# Patient Record
Sex: Female | Born: 1935 | Race: White | Hispanic: No | Smoking: Never smoker
Health system: Southern US, Community
[De-identification: ages and names within clinical notes are randomized; demographics above are authoritative.]

## PROBLEM LIST (undated history)

## (undated) ENCOUNTER — Emergency Department (HOSPITAL_COMMUNITY): Admission: EM | Discharge status: Against Medical Advice | Payer: Medicare Other

## (undated) DIAGNOSIS — J9611 Chronic respiratory failure with hypoxia: Secondary | ICD-10-CM

## (undated) DIAGNOSIS — J449 Chronic obstructive pulmonary disease, unspecified: Secondary | ICD-10-CM

## (undated) DIAGNOSIS — E079 Disorder of thyroid, unspecified: Secondary | ICD-10-CM

## (undated) DIAGNOSIS — I509 Heart failure, unspecified: Secondary | ICD-10-CM

## (undated) DIAGNOSIS — I272 Pulmonary hypertension, unspecified: Secondary | ICD-10-CM

## (undated) DIAGNOSIS — I10 Essential (primary) hypertension: Secondary | ICD-10-CM

## (undated) DIAGNOSIS — K219 Gastro-esophageal reflux disease without esophagitis: Secondary | ICD-10-CM

## (undated) DIAGNOSIS — B029 Zoster without complications: Secondary | ICD-10-CM

## (undated) DIAGNOSIS — N189 Chronic kidney disease, unspecified: Secondary | ICD-10-CM

## (undated) DIAGNOSIS — I499 Cardiac arrhythmia, unspecified: Secondary | ICD-10-CM

## (undated) DIAGNOSIS — H409 Unspecified glaucoma: Secondary | ICD-10-CM

## (undated) DIAGNOSIS — N179 Acute kidney failure, unspecified: Secondary | ICD-10-CM

## (undated) HISTORY — DX: Gastro-esophageal reflux disease without esophagitis: K21.9

## (undated) HISTORY — DX: Chronic kidney disease, unspecified: N18.9

## (undated) HISTORY — PX: KNEE ARTHROSCOPY: SUR90

## (undated) HISTORY — DX: Cardiac arrhythmia, unspecified: I49.9

## (undated) HISTORY — DX: Chronic kidney disease, unspecified: N17.9

## (undated) HISTORY — DX: Zoster without complications: B02.9

## (undated) HISTORY — PX: OTHER SURGICAL HISTORY: SHX169

## (undated) HISTORY — PX: TUBAL LIGATION: SHX77

---

## 1997-12-05 ENCOUNTER — Encounter: Admission: RE | Admit: 1997-12-05 | Discharge: 1997-12-05 | Payer: Self-pay | Admitting: Family Medicine

## 1997-12-27 ENCOUNTER — Encounter: Admission: RE | Admit: 1997-12-27 | Discharge: 1997-12-27 | Payer: Self-pay | Admitting: Family Medicine

## 1998-05-16 ENCOUNTER — Encounter: Admission: RE | Admit: 1998-05-16 | Discharge: 1998-05-16 | Payer: Self-pay | Admitting: Family Medicine

## 1998-06-01 ENCOUNTER — Encounter: Admission: RE | Admit: 1998-06-01 | Discharge: 1998-06-01 | Payer: Self-pay | Admitting: Family Medicine

## 1998-06-15 ENCOUNTER — Encounter: Admission: RE | Admit: 1998-06-15 | Discharge: 1998-09-13 | Payer: Self-pay | Admitting: *Deleted

## 1998-06-22 ENCOUNTER — Other Ambulatory Visit: Admission: RE | Admit: 1998-06-22 | Discharge: 1998-06-22 | Payer: Self-pay | Admitting: *Deleted

## 1998-06-22 ENCOUNTER — Encounter: Admission: RE | Admit: 1998-06-22 | Discharge: 1998-06-22 | Payer: Self-pay | Admitting: Family Medicine

## 1998-06-27 ENCOUNTER — Ambulatory Visit (HOSPITAL_COMMUNITY): Admission: RE | Admit: 1998-06-27 | Discharge: 1998-06-27 | Payer: Self-pay | Admitting: *Deleted

## 1998-07-07 ENCOUNTER — Encounter: Admission: RE | Admit: 1998-07-07 | Discharge: 1998-07-07 | Payer: Self-pay | Admitting: Family Medicine

## 1998-11-15 ENCOUNTER — Encounter: Admission: RE | Admit: 1998-11-15 | Discharge: 1998-11-15 | Payer: Self-pay | Admitting: Family Medicine

## 1998-11-20 ENCOUNTER — Encounter: Admission: RE | Admit: 1998-11-20 | Discharge: 1998-11-20 | Payer: Self-pay | Admitting: Family Medicine

## 1999-09-11 ENCOUNTER — Encounter: Admission: RE | Admit: 1999-09-11 | Discharge: 1999-09-11 | Payer: Self-pay | Admitting: Family Medicine

## 1999-11-16 ENCOUNTER — Encounter: Admission: RE | Admit: 1999-11-16 | Discharge: 1999-11-16 | Payer: Self-pay | Admitting: Family Medicine

## 2000-12-01 ENCOUNTER — Encounter (HOSPITAL_COMMUNITY): Admission: RE | Admit: 2000-12-01 | Discharge: 2000-12-31 | Payer: Self-pay | Admitting: Unknown Physician Specialty

## 2000-12-16 ENCOUNTER — Encounter (HOSPITAL_COMMUNITY): Admission: RE | Admit: 2000-12-16 | Discharge: 2001-01-15 | Payer: Self-pay | Admitting: Oncology

## 2000-12-16 ENCOUNTER — Encounter: Admission: RE | Admit: 2000-12-16 | Discharge: 2000-12-16 | Payer: Self-pay | Admitting: Oncology

## 2001-06-02 ENCOUNTER — Encounter (HOSPITAL_COMMUNITY): Admission: RE | Admit: 2001-06-02 | Discharge: 2001-07-02 | Payer: Self-pay | Admitting: Oncology

## 2001-06-02 ENCOUNTER — Encounter: Admission: RE | Admit: 2001-06-02 | Discharge: 2001-06-02 | Payer: Self-pay | Admitting: Oncology

## 2001-08-17 ENCOUNTER — Encounter (HOSPITAL_COMMUNITY): Admission: RE | Admit: 2001-08-17 | Discharge: 2001-09-16 | Payer: Self-pay | Admitting: Pulmonary Disease

## 2001-10-01 ENCOUNTER — Encounter (HOSPITAL_COMMUNITY): Admission: RE | Admit: 2001-10-01 | Discharge: 2001-10-31 | Payer: Self-pay | Admitting: Orthopaedic Surgery

## 2002-04-08 ENCOUNTER — Emergency Department (HOSPITAL_COMMUNITY): Admission: EM | Admit: 2002-04-08 | Discharge: 2002-04-08 | Payer: Self-pay | Admitting: Emergency Medicine

## 2002-04-08 ENCOUNTER — Encounter: Payer: Self-pay | Admitting: Emergency Medicine

## 2003-09-16 ENCOUNTER — Ambulatory Visit (HOSPITAL_COMMUNITY): Admission: RE | Admit: 2003-09-16 | Discharge: 2003-09-16 | Payer: Self-pay | Admitting: Internal Medicine

## 2004-03-20 ENCOUNTER — Ambulatory Visit (HOSPITAL_COMMUNITY): Admission: RE | Admit: 2004-03-20 | Discharge: 2004-03-20 | Payer: Self-pay | Admitting: Internal Medicine

## 2004-05-31 ENCOUNTER — Ambulatory Visit (HOSPITAL_COMMUNITY): Admission: RE | Admit: 2004-05-31 | Discharge: 2004-05-31 | Payer: Self-pay | Admitting: Internal Medicine

## 2004-09-02 ENCOUNTER — Emergency Department (HOSPITAL_COMMUNITY): Admission: EM | Admit: 2004-09-02 | Discharge: 2004-09-02 | Payer: Self-pay | Admitting: Emergency Medicine

## 2004-09-12 ENCOUNTER — Ambulatory Visit: Payer: Self-pay | Admitting: Internal Medicine

## 2004-09-24 ENCOUNTER — Ambulatory Visit (HOSPITAL_COMMUNITY): Admission: RE | Admit: 2004-09-24 | Discharge: 2004-09-24 | Payer: Self-pay | Admitting: Internal Medicine

## 2004-09-26 ENCOUNTER — Ambulatory Visit: Payer: Self-pay | Admitting: Internal Medicine

## 2004-09-26 ENCOUNTER — Ambulatory Visit (HOSPITAL_COMMUNITY): Admission: RE | Admit: 2004-09-26 | Discharge: 2004-09-26 | Payer: Self-pay | Admitting: Internal Medicine

## 2004-11-12 ENCOUNTER — Ambulatory Visit: Payer: Self-pay | Admitting: Orthopedic Surgery

## 2005-01-01 ENCOUNTER — Ambulatory Visit (HOSPITAL_COMMUNITY): Admission: RE | Admit: 2005-01-01 | Discharge: 2005-01-01 | Payer: Self-pay | Admitting: Orthopedic Surgery

## 2005-01-07 ENCOUNTER — Ambulatory Visit: Payer: Self-pay | Admitting: Orthopedic Surgery

## 2005-01-23 ENCOUNTER — Encounter: Admission: RE | Admit: 2005-01-23 | Discharge: 2005-01-23 | Payer: Self-pay | Admitting: Orthopedic Surgery

## 2005-02-06 ENCOUNTER — Encounter: Admission: RE | Admit: 2005-02-06 | Discharge: 2005-02-06 | Payer: Self-pay | Admitting: Orthopedic Surgery

## 2005-03-01 ENCOUNTER — Encounter: Admission: RE | Admit: 2005-03-01 | Discharge: 2005-03-01 | Payer: Self-pay | Admitting: Orthopedic Surgery

## 2005-03-05 ENCOUNTER — Ambulatory Visit: Payer: Self-pay | Admitting: Internal Medicine

## 2005-03-11 ENCOUNTER — Ambulatory Visit: Payer: Self-pay | Admitting: Orthopedic Surgery

## 2005-03-19 ENCOUNTER — Emergency Department (HOSPITAL_COMMUNITY): Admission: EM | Admit: 2005-03-19 | Discharge: 2005-03-19 | Payer: Self-pay | Admitting: Emergency Medicine

## 2005-04-23 ENCOUNTER — Ambulatory Visit (HOSPITAL_COMMUNITY): Admission: RE | Admit: 2005-04-23 | Discharge: 2005-04-23 | Payer: Self-pay | Admitting: Internal Medicine

## 2005-05-07 ENCOUNTER — Ambulatory Visit: Payer: Self-pay | Admitting: Internal Medicine

## 2005-05-08 ENCOUNTER — Ambulatory Visit (HOSPITAL_COMMUNITY): Admission: RE | Admit: 2005-05-08 | Discharge: 2005-05-08 | Payer: Self-pay | Admitting: Internal Medicine

## 2005-05-31 ENCOUNTER — Ambulatory Visit (HOSPITAL_COMMUNITY): Admission: RE | Admit: 2005-05-31 | Discharge: 2005-05-31 | Payer: Self-pay | Admitting: Neurosurgery

## 2005-06-02 ENCOUNTER — Ambulatory Visit: Admission: RE | Admit: 2005-06-02 | Discharge: 2005-06-02 | Payer: Self-pay | Admitting: Internal Medicine

## 2005-06-13 ENCOUNTER — Ambulatory Visit: Payer: Self-pay | Admitting: Pulmonary Disease

## 2005-06-24 ENCOUNTER — Encounter (HOSPITAL_COMMUNITY): Admission: RE | Admit: 2005-06-24 | Discharge: 2005-07-24 | Payer: Self-pay | Admitting: Neurosurgery

## 2005-07-02 ENCOUNTER — Ambulatory Visit: Payer: Self-pay | Admitting: Internal Medicine

## 2005-07-25 ENCOUNTER — Encounter (HOSPITAL_COMMUNITY): Admission: RE | Admit: 2005-07-25 | Discharge: 2005-08-15 | Payer: Self-pay | Admitting: Neurosurgery

## 2005-08-02 ENCOUNTER — Ambulatory Visit: Payer: Self-pay | Admitting: Internal Medicine

## 2005-09-25 ENCOUNTER — Ambulatory Visit: Payer: Self-pay | Admitting: Orthopedic Surgery

## 2005-10-07 ENCOUNTER — Ambulatory Visit: Payer: Self-pay | Admitting: Internal Medicine

## 2006-08-18 ENCOUNTER — Ambulatory Visit: Payer: Self-pay | Admitting: Internal Medicine

## 2006-08-18 ENCOUNTER — Inpatient Hospital Stay (HOSPITAL_COMMUNITY): Admission: EM | Admit: 2006-08-18 | Discharge: 2006-08-21 | Payer: Self-pay | Admitting: Emergency Medicine

## 2006-09-01 ENCOUNTER — Ambulatory Visit: Payer: Self-pay | Admitting: Internal Medicine

## 2006-09-15 HISTORY — PX: CARDIOVASCULAR STRESS TEST: SHX262

## 2006-10-08 ENCOUNTER — Ambulatory Visit (HOSPITAL_COMMUNITY): Admission: RE | Admit: 2006-10-08 | Discharge: 2006-10-08 | Payer: Self-pay | Admitting: Gastroenterology

## 2006-10-15 ENCOUNTER — Ambulatory Visit (HOSPITAL_COMMUNITY): Admission: RE | Admit: 2006-10-15 | Discharge: 2006-10-15 | Payer: Self-pay | Admitting: Internal Medicine

## 2006-10-20 ENCOUNTER — Ambulatory Visit (HOSPITAL_COMMUNITY): Admission: RE | Admit: 2006-10-20 | Discharge: 2006-10-20 | Payer: Self-pay | Admitting: Internal Medicine

## 2006-10-21 ENCOUNTER — Ambulatory Visit: Payer: Self-pay | Admitting: Gastroenterology

## 2006-11-06 ENCOUNTER — Ambulatory Visit: Payer: Self-pay | Admitting: Internal Medicine

## 2006-12-24 ENCOUNTER — Ambulatory Visit (HOSPITAL_COMMUNITY): Admission: RE | Admit: 2006-12-24 | Discharge: 2006-12-24 | Payer: Self-pay | Admitting: Neurosurgery

## 2007-05-06 ENCOUNTER — Ambulatory Visit: Payer: Self-pay | Admitting: Internal Medicine

## 2007-05-20 ENCOUNTER — Ambulatory Visit: Payer: Self-pay | Admitting: Internal Medicine

## 2007-05-20 ENCOUNTER — Ambulatory Visit (HOSPITAL_COMMUNITY): Admission: RE | Admit: 2007-05-20 | Discharge: 2007-05-20 | Payer: Self-pay | Admitting: Internal Medicine

## 2007-05-20 ENCOUNTER — Encounter: Payer: Self-pay | Admitting: Internal Medicine

## 2007-06-10 ENCOUNTER — Ambulatory Visit: Admission: RE | Admit: 2007-06-10 | Discharge: 2007-06-10 | Payer: Self-pay | Admitting: Internal Medicine

## 2008-05-07 ENCOUNTER — Emergency Department (HOSPITAL_COMMUNITY): Admission: EM | Admit: 2008-05-07 | Discharge: 2008-05-07 | Payer: Self-pay | Admitting: Emergency Medicine

## 2009-06-23 ENCOUNTER — Emergency Department (HOSPITAL_COMMUNITY): Admission: EM | Admit: 2009-06-23 | Discharge: 2009-06-23 | Payer: Self-pay | Admitting: Emergency Medicine

## 2010-01-18 ENCOUNTER — Emergency Department (HOSPITAL_COMMUNITY): Admission: EM | Admit: 2010-01-18 | Discharge: 2010-01-18 | Payer: Self-pay | Admitting: Emergency Medicine

## 2010-02-16 ENCOUNTER — Ambulatory Visit (HOSPITAL_COMMUNITY): Admission: RE | Admit: 2010-02-16 | Discharge: 2010-02-16 | Payer: Self-pay | Admitting: Cardiology

## 2010-02-21 ENCOUNTER — Ambulatory Visit (HOSPITAL_COMMUNITY): Admission: RE | Admit: 2010-02-21 | Discharge: 2010-02-21 | Payer: Self-pay | Admitting: Cardiology

## 2010-02-21 HISTORY — PX: CARDIAC CATHETERIZATION: SHX172

## 2010-03-04 ENCOUNTER — Emergency Department (HOSPITAL_COMMUNITY): Admission: EM | Admit: 2010-03-04 | Discharge: 2010-03-04 | Payer: Self-pay | Admitting: Emergency Medicine

## 2010-09-15 ENCOUNTER — Encounter: Payer: Self-pay | Admitting: Internal Medicine

## 2010-09-16 ENCOUNTER — Encounter: Payer: Self-pay | Admitting: Orthopedic Surgery

## 2010-09-16 ENCOUNTER — Encounter: Payer: Self-pay | Admitting: Internal Medicine

## 2010-09-16 ENCOUNTER — Encounter: Payer: Self-pay | Admitting: Cardiology

## 2010-09-17 ENCOUNTER — Encounter: Payer: Self-pay | Admitting: Internal Medicine

## 2010-11-12 LAB — POCT I-STAT 3, ART BLOOD GAS (G3+)
O2 Saturation: 89 %
pH, Arterial: 7.323 — ABNORMAL LOW (ref 7.350–7.400)
pO2, Arterial: 61 mmHg — ABNORMAL LOW (ref 80.0–100.0)

## 2010-11-12 LAB — GLUCOSE, CAPILLARY
Glucose-Capillary: 167 mg/dL — ABNORMAL HIGH (ref 70–99)
Glucose-Capillary: 202 mg/dL — ABNORMAL HIGH (ref 70–99)

## 2010-11-12 LAB — CBC
MCHC: 30.2 g/dL (ref 30.0–36.0)
MCV: 76.5 fL — ABNORMAL LOW (ref 78.0–100.0)
Platelets: 152 10*3/uL (ref 150–400)
RBC: 5.1 MIL/uL (ref 3.87–5.11)
RDW: 18.2 % — ABNORMAL HIGH (ref 11.5–15.5)
WBC: 6.9 10*3/uL (ref 4.0–10.5)

## 2010-11-12 LAB — DIFFERENTIAL
Basophils Relative: 0 % (ref 0–1)
Eosinophils Absolute: 0.1 10*3/uL (ref 0.0–0.7)
Lymphocytes Relative: 15 % (ref 12–46)
Lymphs Abs: 1 10*3/uL (ref 0.7–4.0)
Metamyelocytes Relative: 0 %
Monocytes Relative: 4 % (ref 3–12)
Myelocytes: 0 %
Neutro Abs: 5.5 10*3/uL (ref 1.7–7.7)
Neutrophils Relative %: 80 % — ABNORMAL HIGH (ref 43–77)

## 2010-11-12 LAB — POCT I-STAT 3, VENOUS BLOOD GAS (G3P V)
Acid-base deficit: 1 mmol/L (ref 0.0–2.0)
Bicarbonate: 25.7 mEq/L — ABNORMAL HIGH (ref 20.0–24.0)
O2 Saturation: 63 %
pCO2, Ven: 50.2 mmHg — ABNORMAL HIGH (ref 45.0–50.0)
pH, Ven: 7.316 — ABNORMAL HIGH (ref 7.250–7.300)
pO2, Ven: 36 mmHg (ref 30.0–45.0)

## 2010-11-12 LAB — BASIC METABOLIC PANEL
BUN: 11 mg/dL (ref 6–23)
CO2: 29 mEq/L (ref 19–32)
Calcium: 9.6 mg/dL (ref 8.4–10.5)
GFR calc Af Amer: >60 mL/min (ref >60)
GFR calc non Af Amer: >60 mL/min (ref >60)

## 2010-11-12 LAB — BRAIN NATRIURETIC PEPTIDE: Pro B Natriuretic peptide (BNP): 35.4 pg/mL (ref 0.0–100.0)

## 2010-11-12 LAB — POCT CARDIAC MARKERS

## 2010-11-29 LAB — DIFFERENTIAL
Basophils Relative: 0 % (ref 0–1)
Eosinophils Absolute: 0.1 10*3/uL (ref 0.0–0.7)
Eosinophils Relative: 1 % (ref 0–5)
Lymphocytes Relative: 12 % (ref 12–46)
Neutro Abs: 5.7 10*3/uL (ref 1.7–7.7)
Neutrophils Relative %: 77 % (ref 43–77)

## 2010-11-29 LAB — CBC
MCV: 72.9 fL — ABNORMAL LOW (ref 78.0–100.0)
Platelets: 157 10*3/uL (ref 150–400)
RBC: 4.81 MIL/uL (ref 3.87–5.11)

## 2010-11-29 LAB — COMPREHENSIVE METABOLIC PANEL
AST: 55 U/L — ABNORMAL HIGH (ref 0–37)
Alkaline Phosphatase: 96 U/L (ref 39–117)
BUN: 11 mg/dL (ref 6–23)
Creatinine, Ser: 0.81 mg/dL (ref 0.4–1.2)
GFR calc non Af Amer: >60 mL/min (ref >60)
Glucose, Bld: 230 mg/dL — ABNORMAL HIGH (ref 70–99)
Potassium: 3.9 mEq/L (ref 3.5–5.1)
Total Protein: 6.7 g/dL (ref 6.0–8.3)

## 2010-11-29 LAB — URINALYSIS, ROUTINE W REFLEX MICROSCOPIC
Glucose, UA: NEGATIVE mg/dL
Nitrite: NEGATIVE
pH: 6 (ref 5.0–8.0)

## 2010-11-29 LAB — URINE MICROSCOPIC-ADD ON

## 2011-01-08 NOTE — Op Note (Signed)
[name], [name]              ACCOUNT NO.:  [id number]   MEDICAL RECORD NO.:  [id number]          PATIENT TYPE:  AMB   LOCATION:  DAY                           FACILITY:  [facility]   PHYSICIAN:  R. Roetta Sessions, M.D. DATE OF BIRTH:  October 05, 1934   DATE OF PROCEDURE:  05/20/2007  DATE OF DISCHARGE:                               OPERATIVE REPORT   PROCEDURE PERFORMED:  Esophagogastroduodenoscopy with biopsy.   INDICATIONS FOR PROCEDURE:  76 year old lady with intermittent nausea,  vomiting, reflux symptoms, refractory to Zegerid, history of mass  cirrhosis.  EGD is now being done to further evaluate her symptoms.  This approach has been discussed with the patient at length.  The  potential risks, benefits and alternatives have been reviewed, questions  answered, she is agreeable, please see documentation in the medical  record.   PROCEDURE NOTE:  O2 saturation, blood pressure, pulse rate, and  respirations were monitored throughout the entire procedure.  Conscious  sedation with Versed 3 mg IV and Demerol 75 mg IV in divided doses.  Cetacaine spray for topical pharyngeal anesthesia.  Instrument Pentax  video chip system.   FINDINGS:  Examination of the tubular esophagus revealed entirely normal  esophageal mucosa.  There were no varices.  The EG junction was easily  traversed.  The gastric cavity was empty and insufflated well with air.  A thorough examination of the gastric mucosa including retroflexion of  the proximal stomach and esophagogastric junction demonstrated polypoid  appearing mucosa with overlying white plaques in a linear distribution  down across the pylorus just into the bulb.  Please see photos.  This  appeared to be a benign process.  However, the remainder of the gastric  appeared normal.  Pylorus patent, easily traversed.  Examination of the  bulb and second portion revealed no other mucosal abnormalities.   THERAPEUTIC/DIAGNOSTIC MANEUVERS:  These polypoid  areas of gastric  mucosa were biopsied for histologic study.  The patient tolerated the  procedure well and was reacted in endoscopy.   IMPRESSION:  Normal esophagus.  A linear array of polypoid mucosa with  overlying white exudate antrum extending just into the duodenal bulb,  likely a benign process, biopsied.  No evidence of portal gastropathy,  infiltrating process, or peptic ulcer disease. Patent pylorus, otherwise  normal D1 and D2.   RECOMMENDATIONS:  1. Stop Zegerid, begin AcipHex 20 mg orally daily for the time being.  2. Proceed with a solid phase gastric emptying study to evaluate      gastric emptying.  3. Further recommendations to follow.      [name], M.D.  Electronically Signed     RMR/MEDQ  D:  05/20/2007  T:  05/20/2007  Job:  [id number]   cc:   Kari D. Felecia Shelling, MD  Fax: [phone]

## 2011-01-08 NOTE — H&P (Signed)
NAMEADISSON, Lane              ACCOUNT NO.:  [id number]   MEDICAL RECORD NO.:  [id number]          PATIENT TYPE:  AMB   LOCATION:  DAY                           FACILITY:  [facility]   PHYSICIAN:  R. Roetta Sessions, M.D. DATE OF BIRTH:  1935/06/18   DATE OF ADMISSION:  DATE OF DISCHARGE:  LH                              HISTORY & PHYSICAL   CHIEF COMPLAINT:  Postprandial nausea and vomiting.  Reflux symptoms  refractory to Zegerid.   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  Zegerid is not helping them at this point in time.  She does not have any dysphagia.  She has been having intermittent  nausea and vomiting for several months, however.  This lady was recently  evaluated down at [facility] by Dr. Julieta Gutting.  We wanted to see if  we could get her in study protocols given her history of NASH cirrhosis.  She unfortunately was not a candidate for any specific trials.  She was  noted to be immune to hepatitis A and susceptible to hepatitis B and she  has had two or three of her immunization series for hepatitis B.  She  has not had any melena or rectal bleeding.   She has gained 6 pounds since she was last here.  Her other recent past  GI issues included episode of acute pancreatitis December 2007.  Gallbladder is in situ.  There is no evidence on ultrasound.  She had an  endoscopic ultrasound as well.  She had a diffusely hyperechoic  pancreatic parenchyma with fatty replacement, but there was no evidence  of stone disease.  It was notable pancreatis divisum could not be ruled  out.  Gallbladder appeared normal endosonographically.  She also notes  some progressive constipation.  She has not had any melena or rectal  bleeding.  She had a negative colonoscopy in 2005.  An EGD in 2005  demonstrated fundal gland polyps.  She also had a Given study which was  unremarkable (done for IDA) previously.  She is not taking anything for  her constipation symptoms at this time.  Prior CT demonstrated some  calcification on the mesenteric blood vessel takeoffs but no high-grade  stenosis.   PAST MEDICAL HISTORY:  1. Longstanding diabetes mellitus.  2. Hypertension.  3. Osteoarthritis.  4. Hypothyroidism.  5. Hyperlipidemia.  6. GERD.  7. History of pancreatitis.  8. Status post right oophorectomy.  9. Status post tubal ligation.   Celiac antibody panel previously negative.  Prior EGD in 2005 revealed  fundal gland polyps, biopsy proven.  She also had her esophagus dilated.  She was noted to have a subtle Schatzki's ring at that time.   CURRENT MEDICATIONS:  1. Synthroid daily dose unknown.  2. Verapamil 240 mg daily.  3. Xanax 0.5 mg b.i.d.  4. Tylenol p.r.n.  5. Hyzaar 100/25 daily.  6. Furosemide 20 mg daily.  7. Zetia 10 mg daily.  8. Hydrocodone/APAP 5/500 p.r.n.  9. Zegerid 40 mg daily.  10.Vitamin  B12 daily.  11.Actos 30 mg daily.  12.Allegra 180 mg daily.   ALLERGIES:  PENICILLIN.   FAMILY HISTORY:  Mother died of heart disease and diabetes age 34.  Father died with heart attack age 32.   SOCIAL HISTORY:  The patient is widowed.  She has five children.  She  lives at home.  No tobacco, no alcohol.   REVIEW OF SYSTEMS:  No recent chest pain, dyspnea on exertion.  She has  gained 6 pounds since she was here lastly.   EXAMINATION TODAY:  Reveals a morbidly-obese chronically-ill lady in no  acute distress.  Weight 290, height 5 feet 2 inches, temperature 97.6,  BP 118/70, pulse 76.  SKIN:  Warm and dry.  There is no jaundice.  HEENT:  No scleral icterus, conjunctivae are pink.  Oral cavity - no  lesions.  CHEST:  Lungs are clear to auscultation.  CARDIOVASCULAR:  Regular rate and rhythm without murmur, gallop, rub.  ABDOMEN:  Massively obese, positive bowel sounds, no obvious mass or  organomegaly.  No obvious shifting  dullness or fluid wave.  She does  have some epigastric tenderness to palpation.  EXTREMITY EXAM:  Trace lower extremity edema.   IMPRESSION:  [name] is a pleasant 76 year old lady with  recent worsening of abdominal pain and nausea/vomiting and reflux  symptoms refractory to Zegerid.  She is massively obese.  She has  history of NASH cirrhosis and a recent bout of pancreatitis, etiology  never well defined.  The findings of imaging are reassuring at least for  no evidence of tumor or occult gallstone disease.  I am glad to see that  she was finally evaluated down at [facility].  She is about to finish up her  hepatitis B vaccine series.   RECOMMENDATIONS:  EGD is warranted now to further evaluate her abdominal  pain, nausea, vomiting, refractory GERD symptoms, and to screen her for  esophageal varices.  This approach has been discussed with the  patient at length.  Potential risks, benefits and alternatives have been  reviewed.  Will go ahead and check her amylase, lipase, CBC, and LFTs  today.  Will go ahead and start her on some low-dose lactulose for  constipation, 15 mL at bedtime, prescription given.  Will make further  recommendations in the very near future.      Jonathon Bellows, M.D.  Electronically Signed     RMR/MEDQ  D:  05/06/2007  T:  05/06/2007  Job:  [id number]   cc:   Tesfaye D. Felecia Shelling, MD  Fax: [phone]

## 2011-01-11 NOTE — H&P (Signed)
NAMEJAYNIE, [name]                          ACCOUNT NO.:  [id number]   MEDICAL RECORD NO.:  [id number]                    PATIENT TYPE:   LOCATION:                                       FACILITY:   PHYSICIAN:  R. Roetta Sessions, M.D.              DATE OF BIRTH:  Aug 14, 1935   DATE OF ADMISSION:  DATE OF DISCHARGE:                                HISTORY & PHYSICAL   CHIEF COMPLAINT:  Longstanding gastroesophageal reflux disease, intermittent  esophageal dysphagia.  No prior colonoscopy.   HISTORY OF PRESENT ILLNESS:  [name] is a 76 year old morbidly-obese lady  who presented for further evaluation of gastroesophageal reflux disease back  in July.  We saw Kari in the office. We started Kari on some Nexium which is  very successful in controlling Kari symptoms.  She does have intermittent  esophageal dysphagia to solids and pills.  She had never had a colonoscopy.  She was felt to have an enlarged liver when she was seen previously.  Ultrasound did reveal fatty infiltration of the liver, but diminished  sensitivity of the ultrasound exam due to Kari large body habitus. Spleen was  slightly enlarged.  LFT's were normal except for a slightly-elevated  alkaline phosphatase at 123.   PAST MEDICAL HISTORY:  Type 2 diabetes mellitus within osteoarthritis,  hyperthyroidism, hypertension, hypercholesterolemia, gastroesophageal reflux  disease times years, seasonal allergies.   CURRENT MEDICATIONS:  1.  Nexium 40 mg daily.  2.  Tricor.  3.  Ultracet.  4.  Tylenol.  5.  Xanax.  6.  Verapamil.  7.  Travatan eye drops.  8.  Cozaar.  9.  Amaryl.  10. Avandia.  11. Zyrtec.   ALLERGIES:  PENICILLIN.   PAST SURGICAL HISTORY:  1.  Tubal ligation.  2.  Left-knee arthroscopy.  3.  Right oophorectomy.   FAMILY HISTORY:  Mother died of heart disease and diabetes at age 61.  Father died of heart disease at age 3.  No history of colorectal cancer or  other chronic gastrointestinal  illness.   SOCIAL HISTORY:  The patient is widowed in June of this year.  She has five  children.  She is a Fut[employer] trader.  No tobacco, no alcohol.   REVIEW OF SYSTEMS:  As in history of present illness.   PHYSICAL EXAMINATION:  GENERAL:  A pleasant, 76 year old lady.  VITAL SIGNS:  Weight 280.  She is up 10 pounds from when she was originally  seen on March 16, 2004.  Height 5 feet, 3 inches.  Blood pressure 130/70.  Pulse 88.  SKIN: Skin warm and dry.  No jaundice or stigmata of chronic liver disease.  HEENT:  No scleral icterus.  Oral cavity, no lesions.  CHEST:  Lungs are clear to auscultation.  CARDIAC:  Regular rate and rhythm without murmur, gallop or rub.  BREAST:  Deferred.  ABDOMEN:  Massively obese.  She was unable to get  up on the examining table.  Therefore, she was examined in the chair which was suboptimal.  The abdomen  is soft and nontender.  Liver edge is palpable below the right costal  margin.  Spleen is not appreciated.   IMPRESSION:  A 76 year old lady with a longstanding history of  gastroesophageal reflux disease symptoms.  The symptoms are well controlled  on Nexium.  She does describe to me intermittent esophageal dysphagia.  She  has a fatty liver in a setting of morbid obesity, and type 2 diabetes  mellitus.  She has never had a colonoscopy.   I have recommended both an esophagogastroduodenoscopy (EGD) and colonoscopy  in the very near future.  I have discussed this approach with [name] at  length, the potential risks, benefits, alternatives have been reviewed.  Kari  Lane was also present for the discussion.  All questions were answered.  She is agreeable.  Plan is set up for EGD and colonoscopy in the near  future.   I have advised [name] to please try to loose some weight.  This would be  of overall great benefit to Kari health.  Further recommendations to follow  pending results of EGD and colonoscopy.                                                 [name], M.D.    RMR/MEDQ  D:  05/14/2004  T:  05/14/2004  Job:  [id number]   cc:   Tesfaye D. Felecia Shelling, M.D.  [address]  [place]  [place] [address]  Fax: [phone]   R. Roetta Sessions, M.D.  [address]  [place]  [place] [address]  Fax: [phone]

## 2011-01-11 NOTE — Consult Note (Signed)
NAMEPIETRINA, Lane              ACCOUNT NO.:  [id number]   MEDICAL RECORD NO.:  [id number]          PATIENT TYPE:  INP   LOCATION:  A217                          FACILITY:  [facility]   PHYSICIAN:  Lionel December, M.D.    DATE OF BIRTH:  02-01-1935   DATE OF CONSULTATION:  08/18/2006  DATE OF DISCHARGE:                                 CONSULTATION   REASON FOR CONSULTATION:  Acute pancreatitis.   HISTORY OF PRESENT ILLNESS:  [name] is a 76 year old Caucasian female  who was in her usual state of health until last night.  She went to bed  fine, but she woke up about an hour later with severe pain across her  upper abdomen.  This pain was not associated with nausea, vomiting,  fever or chills.  It was unrelenting pain.  She, therefore, asked her  family members to bring her to the emergency room.  She was evaluated in  the emergency room and noted to have elevated amylase and lipase.  She  was hospitalized and begun on therapy.  She had ultrasound this morning  which shows fatty liver, splenomegaly, and fatty pancreas, no evidence  of dilated biliary system.  This was not well seen, though.  This study  did not show any changes of pancreatitis.   This morning she feels better. Now she has soreness mainly in her  epigastric area.  She recalls she may have had an episode 6 years or  longer ago.  She states she had a cardiac catheterization which was  normal.  She has never been told that she had pancreatitis.  She has  seen Dr. Jena Gauss in the past for chronic GERD and dysphagia and had EGD in  October 2005 and had her esophagus dilated.  She also had benign antral  polyps.   REVIEW OF THE SYSTEMS:  Negative for melena or rectal bleeding, diarrhea  or constipation.  She has had a good appetite, but she is trying hard to  lose weight.  She states she has lost about 10-12 pounds this year.  She  has generalized joint pains, worse in her hip joints.   CURRENT MEDICATIONS:  1.  Hydrochlorothiazide 25 mg p.o. daily.  2. Synthroid 150 mcg daily.  3. Cozaar 100 mg daily.  4. Protonix 40 mg IV q.24 h.  5. Travatan eye drops to left eye at bedtime.  6. Verapamil 240 mg daily.  7. Xanax 0.25 mg b.i.d. p.r.n.  8. Hydrocodone/APAP 5/500 one q. 6 h, p.r.n.  9. Morphine sulfate 1-2 mg IV q. 4 h. p.r.n.  10.Zofran 4 mg IV q. 6 h. p.r.n.  11.GI cocktail   At home, she also has been on:  1. Amaryl 4 mg daily.  2. Actos 30 mg daily.  3. Zegerid 40 mg q.a.m.  4. Zertac10 mg daily.  5. Doxycycline 100 mg b.i.d..   PAST MEDICAL HISTORY:  She has been diabetic and hypertensive for about  15 years.  She has generalized osteoarthrosis, worse in the hip joints.  She has hypothyroidism, hyperlipidemia, chronic GERD.  She had her  esophagus dilated in  October 2005 at which time she also had a screening  colonoscopy which was normal other than internal hemorrhoids.  She had  the right oophorectomy in 1980, and prior that, she had tubal ligation  in 1968.   History of splenomegaly based on ultrasonography of July 2005 and  September 2006 and seen again on the current study.  The patient is not  aware of this diagnosis.   Obesity.   Glaucoma, left eye.   She is receiving doxycycline for skin infection to her abdominal wall.   ALLERGIES:  To PENICILLIN. She developed skin rash, dizziness, and  syncope.  She did not indicate that she is allergic to DARVOCET-N 100,  but chart does reflect that.   FAMILY HISTORY:  Both parents are deceased.  Mother had CAD and died of  MI at age 52.  Father had CAD but died of bone cancer at age 41.  She  has two brothers and four sister sisters, all of whom are diabetic.  One  brother and one sister are on insulin.   SOCIAL HISTORY:  She is widowed.  She has five children in good health.  She helped with farming and also worked at [employer], but she is now  retired.  She has never smoked cigarettes but does dip snuff which she  states she  started at age 78. She does not drink alcohol.   PHYSICAL EXAMINATION:  GENERAL:  Pleasant, moderately obese Caucasian  female who is in no acute distress.  VITAL SIGNS: Her weight is not available. Pulse 90 per minute, blood  pressure 141/72, temperature 98.3, respirations 18.  HEENT: Conjunctivae is pink.  Sclerae is nonicteric.  Oral pharyngeal  mucosa is normal.  A few teeth are missing, the rest in fair to  satisfactory condition.  NECK:  No neck masses or thyromegaly noted.  CARDIAC EXAM: with regular rhythm.  Normal S1-S2.  No murmur or gallop  noted.  LUNGS:  Clear to auscultation.  ABDOMEN: Protuberant.  Bowel sounds are normal.  She has mild to  moderate tenderness at mid epigastrium and mild tenderness at left upper  quadrant.  Spleen is not palpable.  Liver edge is 6 cm below right  costal margin.  It Is soft and minimally tender, span 15-16 cm.  EXTREMITIES:  She does not have peripheral edema or clubbing.   Labs from admission which was last night, WBC 4.4, hemoglobin 10.8,  hematocrit 33.3, MCV  75.6, platelet count 163,000. Serum sodium 139,  potassium 3.7, chloride 108, CO2 23, glucose 146, BUN 17, creatinine  0.7, bilirubin 0.8, alkaline phosphatase 106, AST 32, ALT 17, total  protein 6.2 with albumin of 3.6 and calcium of 9.8.  Her amylase was  174, lipase 347. Troponin was less than 0.05.   Labs from this morning: Bilirubin is 0.7, alkaline phosphatase 111, AST  59, ALT 28, albumin 3.2.  Amylase is 177, and lipase is 253.   Ultrasound reviewed with Dr. Carolann Littler. She has fatty liver, splenomegaly,  fatty pancreas, CBD, and intrahepatic biliary radicals do not appear to  be dilated.   ASSESSMENT:  [name] is a 76 year old Caucasian female who presents with  acute onset of upper abdominal pain associated with elevated amylase and  lipase of 174 and 347, respectively.  She had normal transaminase on admission, but her AST is now mildly elevated at 59, but ALT is  normal.  Ultrasound is negative for cholelithiasis, dilated common bile duct, or  changes of pancreatitis, but she  does have splenomegaly.  Apparently she  has had enlarged spleen since July 2005.   I suspect that she has biliary pancreatitis.  However, would need more  data to support this diagnosis before ERCP is considered.  If there is  significant bump in her transaminases, would consider ERCP with  sphincterotomy.  If this is not the case and CT which will be ordered is  negative, she will need endoscopic ultrasound.   Anemia.  MCV is low.  No history of GI bleed. Last EGD and colonoscopy  were in October 2005.   Splenomegaly of at least 2-1/2 years duration.  This may be nonspecific  or indicative of underlying occult liver disease; i.e., nonalcoholic  steatohepatitis.   RECOMMENDATIONS:  1. Abdominal CT today with pancreas protocol.  2. Check her PT/PTT and repeat LFTs in a.m..  Will also request iron      studies, B12 and folate.   We would like to thank Dr. Felecia Shelling for the opportunity to participate in  the care of this nice lady.      Lionel December, M.D.  Electronically Signed     NR/MEDQ  D:  08/18/2006  T:  08/18/2006  Job:  [id number]

## 2011-01-11 NOTE — H&P (Signed)
[name], [name]              ACCOUNT NO.:  [id number]   MEDICAL RECORD NO.:  [id number]          PATIENT TYPE:  INP   LOCATION:  A217                          FACILITY:  [facility]   PHYSICIAN:  Tesfaye D. Felecia Shelling, MD   DATE OF BIRTH:  18-Jan-1935   DATE OF ADMISSION:  08/17/2006  DATE OF DISCHARGE:  LH                              HISTORY & PHYSICAL   CHIEF COMPLAINT:  Abdominal pain.   HISTORY OF PRESENT ILLNESS:  This is a 76 year old female with a history  of multiple medical illnesses who was brought to the emergency room due  to abdominal pain of two days' duration.  The pain is mainly in the  epigastric and upper quadrant area.  The patient notes that the pain is  persistent.  No nausea or vomiting.  She was evaluated in the emergency  room and was admitted.  The patient feels slightly better now.   REVIEW OF SYSTEMS:  No fever, chills, cough, chest pain, shortness of  breath, palpitations, nausea, vomiting, dysuria, urgency, or frequency  of urination.   PAST MEDICAL HISTORY:  1. Diabetes mellitus.  2. Hypothyroidism.  3. Degenerative joint disease.  4. Hyperlipidemia.  5. Gastroesophageal reflux disease.  6. History of allergic rhinitis.   CURRENT MEDICATIONS:  1. Amaryl 4 mg daily.  2. Hydrochlorothiazide 25 mg p.o. daily.  3. Synthroid 125 mcg p.o. daily.  4. Lortab 5/500 p.o. q.6 h. p.r.n.   The patient has some more medications.  However, she does not know all  the list.   SOCIAL HISTORY:  The patient lives with her family.  She has no history  of alcohol, tobacco, or substance abuse.   PHYSICAL EXAMINATION:  GENERAL:  The patient is alert, awake, and  chronically sick looking.  VITAL SIGNS:  Blood pressure 141/72, pulse 90, respiratory rate 18,  temperature 98.3 degrees Fahrenheit.  HEENT:  Pupils are equal and reactive.  NECK:  Supple.  CHEST:  Clear lung fields.  Good air entry.  CARDIOVASCULAR:  First and second heart sounds heard.  No murmur, no  gallop.  ABDOMEN:  Obese.  There is mild tenderness around the epigastric and  right upper quadrant area.  Bowel sounds are positive.  No masses, no  organomegaly.  EXTREMITIES:  No leg edema.   LABORATORIES ON ADMISSION:  WBC 4.4, hemoglobin 10.8, hematocrit 33.3,  platelets 163.  Sodium 138, potassium 3.7, chloride 108, carbon dioxide  23, glucose 146, BUN 17, creatinine 0.7, bilirubin 0.8, alkaline  phosphatase 106, AST 33, ALT 17, total protein 6.2, albumin 3.6, calcium  9.8, amylase 174, lipase 347.  Cardiac enzymes are negative.   ASSESSMENT:  This is a 76 year old female patient who presented with  upper quadrant and epigastric area pain.  The etiology of her pain is  not clear at this time.  The possibility of acute cholecystitis has to  be considered.   PLAN:  Will do abdominal sonogram.  Will start the patient on a clear  liquid diet.  Will continue a proton pump inhibitor, and will do a GI  consult.  Will start the  patient on her regular medications.      Tesfaye D. Felecia Shelling, MD  Electronically Signed     TDF/MEDQ  D:  08/18/2006  T:  08/18/2006  Job:  [id number]

## 2011-01-11 NOTE — Op Note (Signed)
NAMEJUANICE, [name]              ACCOUNT NO.:  [id number]   MEDICAL RECORD NO.:  [id number]            PATIENT TYPE:   LOCATION:                                 FACILITY:   PHYSICIAN:  R. Roetta Sessions, M.D.      DATE OF BIRTH:   DATE OF PROCEDURE:  05/31/2004  DATE OF DISCHARGE:                                 OPERATIVE REPORT   PROCEDURE:  Esophagogastroduodenoscopy with Elease Hashimoto dilation followed by  snare polypectomy, followed by screening colonoscopy.   ENDOSCOPIST:  Gerrit Friends. Rourk, M.D.   INDICATIONS FOR PROCEDURE:  The patient is a 76 year old lady with  intermittent esophageal dysphagia, reflux symptoms, fairly well controlled  on 40 mg orally daily of Nexium.  She is here for screening colonoscopy and  further evaluation of dysphagia.  This approach has been discussed with the  patient at length.  The potential risks, benefits, and alternatives have  been reviewed; questions answered.   PROCEDURE NOTE:  O2 saturation, blood pressure, pulse and respirations were  monitored throughout the entire procedure.  Conscious sedation: Versed 100  mg IV, Demerol 7 mg IV in divided doses.   INSTRUMENT:  Olympus video chip system.   FINDINGS:  Examination of the tubular esophagus revealed subtle Schatzki  ring, otherwise esophageal mucosa appeared normal.  The EG junction was  easily traversed.   STOMACH:  The gastric cavity was empty.  It insufflated well with air.  A  thorough examination of the gastric mucosa including a retroflex view of the  proximal stomach and esophagogastric junction demonstrated only a small  hiatal hernia and multiple gastric antral polyps, the largest approximately  8 mm.  It was pedunculated.  Please see photos.  The pylorus was patent and  easily traversed.  Examination of the bulb and second portion revealed no  abnormalities.   THERAPEUTIC/DIAGNOSTIC MANEUVERS:  1.  A 56 French Maloney dilator was passed to full insertion with a look      back  revealing no apparent complication related to passage of the      dilator.  2.  The largest of the antral polyps was removed with snare polypectomy and      recovery with the Lucina Mellow net without apparent complication.   The patient tolerated the procedure well and was prepared for colonoscopy.  A digital rectal exam revealed no abnormalities.   ENDOSCOPIC FINDINGS:  The prep was adequate.   RECTUM:  Examination of the rectal mucosa including the retroflex view of  the anal verge revealed only internal hemorrhoids.   COLON:  The colonic mucosa was surveyed from the rectosigmoid junction  through the left transverse and right colon to the area of the appendiceal  orifice, ileocecal valve, and cecum.  These structures were well seen and  photographed for the record.   From this level the scope was slowly withdrawn.  All previously mentioned  mucosal surfaces were again seen.  The colonic mucosa appeared normal.  The  patient tolerated both procedures well and was reacted in endoscopy.   EGD IMPRESSION:  1.  Subtle Schatzki ring, otherwise  normal esophagus, status post dilation      as described above.  2.  Stomach multiple antral polyps and small hiatal hernia, status post      polypectomy.  3.  The remainder of the gastric mucosa appeared normal.  4.  Normal D1 and D2.   COLONOSCOPY FINDINGS:  1.  Internal hemorrhoids; otherwise normal rectum.  2.  Normal colon.   RECOMMENDATIONS:  1.  No aspirin or arthritis medications for the next 10 days.  2.  Follow up on path.  3.  Continue Nexium 40 mg orally daily.  4.  Further recommendations to follow.     Otelia Sergeant   RMR/MEDQ  D:  05/31/2004  T:  05/31/2004  Job:  [id number]   cc:   Tesfaye D. Felecia Shelling, MD  [address]  [place]  [place] [address]  Fax: [phone]   R. Roetta Sessions, M.D.  [address]  [place]  [place] [address]  Fax: [phone]

## 2011-01-11 NOTE — Discharge Summary (Signed)
[name], [name]              ACCOUNT NO.:  [id number]   MEDICAL RECORD NO.:  [id number]          PATIENT TYPE:  INP   LOCATION:  A217                          FACILITY:  [facility]   PHYSICIAN:  Tesfaye D. Felecia Shelling, MD   DATE OF BIRTH:  [birth year]   DATE OF ADMISSION:  08/18/2006  DATE OF DISCHARGE:  12/27/2007LH                               DISCHARGE SUMMARY   DISCHARGE DIAGNOSES:  1. Acute pancreatitis.  2. Diabetes mellitus.  3. Morbid obesity.  4. Degenerative joint disease.  5. Anemia.  6. History of gastroesophageal reflux disease.  7. Fever of unknown origin.  8. Chronic splenomegaly.  9. Hypertension.   DISCHARGE MEDICATIONS:  1. Ferrous sulfate 325 mg p.o. b.i.d.  2. Levothyroxine 150 mg p.o. daily.  3. Amaryl 4 mg daily.  4. Actos 30 mg p.o. daily.  5. Xanax 0.5 mg b.i.d.  6. Verapamil ER 240 mg daily.  7. Zetia 30 mg daily.  8. Hyzaar 10/25 one tablet p.o. daily.  9. Lortab 5/500 one tablet q.6 h. p.r.n. for pain.   DISPOSITION:  Patient was discharged to home in stable condition.   HOSPITAL COURSE:  This is a 76 year old female patient with history of  multiple medical illnesses was admitted due to abdominal pain of two  days duration. Her amylase and lipase were elevated. She was seen by GI.  The patient was treated medically. Over her hospital stay,  her symptoms  improved. She was discharged back home to continue her __________.      Tesfaye D. Felecia Shelling, MD  Electronically Signed    TDF/MEDQ  D:  09/18/2006  T:  09/18/2006  Job:  [id number]

## 2011-01-11 NOTE — Procedures (Signed)
[name], [name]              ACCOUNT NO.:  [id number]   MEDICAL RECORD NO.:  [id number]          PATIENT TYPE:  OUT   LOCATION:  SLEEP LAB                     FACILITY:  [facility]   PHYSICIAN:  Marcelyn Bruins, M.D. [facility] DATE OF BIRTH:  [birth year]   DATE OF STUDY:  06/02/2005                              NOCTURNAL POLYSOMNOGRAM   REFERRING PHYSICIAN:  Dr. Mayo Ao.   INDICATIONS FOR STUDY:  Hypersomnia with sleep apnea. Epworth score: 11.   SLEEP ARCHITECTURE:  The patient has total sleep time of 179 minutes with  very decreased slow wave sleep and REM sleep onset latency was very  prolonged at 258 minutes and REM onset was at the upper limits of normal.  Sleep efficiency was very poor at 41%.   RESPIRATORY DATA:  The patient was found to have 55 obstructive apneas and  24 obstructive hypopneas for a respiratory disturbance index of 27 events  per hour. It is very interesting that all of her events were REM related,  and occurred at the very end of the study. Therefore, the patient did not  meet split night criteria. There was moderate to loud snoring.   OXYGEN DATA:  The patient had O2 desaturation as low as 70% during her REM  related events.   CARDIAC DATA:  The patient had rare PAC and PVC with no clinically  significant cardiac arrhythmias.   MOVEMENT/PARASOMNIAS:  The patient was found to have 22 leg jerks with only  one per hour resulting in arousal or awakening. There was no abnormal  behavior behaviors noted.   IMPRESSION/RECOMMENDATIONS:  1.  Moderate obstructive sleep apnea that is primarily REM related.      Respiratory disturbance index was 27 events per hour with O2      desaturation as low as 70%. Treatment for this degree of sleep apnea may      include weight loss if appropriate, upper airway surgery, oral      appliance, or C-PAP. Consideration could also be given to REM      suppressing drugs, however I would consider this as second-line therapy.  2.   Prolonged sleep onset latency. It is unclear whether this is due to an      element of insomnia or whether it was a first night effect associated      with the sleep lab. Clinical correlation is suggested.                                           ______________________________  Marcelyn Bruins, M.D. [facility]  Diplomate, American Board of Sleep  Medicine    KC/MEDQ  D:  06/13/2005 14:21:26  T:  06/13/2005 15:52:23  Job:  [id number]

## 2011-02-06 ENCOUNTER — Other Ambulatory Visit (HOSPITAL_COMMUNITY)
Admission: RE | Admit: 2011-02-06 | Discharge: 2011-02-06 | Disposition: A | Discharge status: Home / Self Care | Payer: Medicare Other | Source: Ambulatory Visit | Attending: General Surgery | Admitting: General Surgery

## 2011-02-06 ENCOUNTER — Other Ambulatory Visit: Payer: Self-pay | Admitting: General Surgery

## 2011-02-06 DIAGNOSIS — C44319 Basal cell carcinoma of skin of other parts of face: Secondary | ICD-10-CM | POA: Insufficient documentation

## 2011-03-05 ENCOUNTER — Other Ambulatory Visit (HOSPITAL_COMMUNITY): Payer: Self-pay | Admitting: Internal Medicine

## 2011-03-05 DIAGNOSIS — Z139 Encounter for screening, unspecified: Secondary | ICD-10-CM

## 2011-03-08 ENCOUNTER — Ambulatory Visit (HOSPITAL_COMMUNITY)
Admission: RE | Admit: 2011-03-08 | Discharge: 2011-03-08 | Disposition: A | Discharge status: Home / Self Care | Payer: PRIVATE HEALTH INSURANCE | Source: Ambulatory Visit | Attending: Internal Medicine | Admitting: Internal Medicine

## 2011-03-08 DIAGNOSIS — Z139 Encounter for screening, unspecified: Secondary | ICD-10-CM

## 2011-03-08 DIAGNOSIS — M818 Other osteoporosis without current pathological fracture: Secondary | ICD-10-CM | POA: Insufficient documentation

## 2011-03-08 DIAGNOSIS — Z1231 Encounter for screening mammogram for malignant neoplasm of breast: Secondary | ICD-10-CM | POA: Insufficient documentation

## 2011-03-08 DIAGNOSIS — Z780 Asymptomatic menopausal state: Secondary | ICD-10-CM | POA: Insufficient documentation

## 2011-03-29 ENCOUNTER — Encounter: Payer: Self-pay | Admitting: Emergency Medicine

## 2011-03-29 ENCOUNTER — Emergency Department (HOSPITAL_COMMUNITY): Payer: PRIVATE HEALTH INSURANCE

## 2011-03-29 ENCOUNTER — Emergency Department (HOSPITAL_COMMUNITY)
Admission: EM | Admit: 2011-03-29 | Discharge: 2011-03-29 | Disposition: A | Discharge status: Home / Self Care | Payer: PRIVATE HEALTH INSURANCE | Attending: Emergency Medicine | Admitting: Emergency Medicine

## 2011-03-29 DIAGNOSIS — IMO0002 Reserved for concepts with insufficient information to code with codable children: Secondary | ICD-10-CM | POA: Insufficient documentation

## 2011-03-29 DIAGNOSIS — S82899A Other fracture of unspecified lower leg, initial encounter for closed fracture: Secondary | ICD-10-CM

## 2011-03-29 DIAGNOSIS — S8000XA Contusion of unspecified knee, initial encounter: Secondary | ICD-10-CM

## 2011-03-29 DIAGNOSIS — W010XXA Fall on same level from slipping, tripping and stumbling without subsequent striking against object, initial encounter: Secondary | ICD-10-CM

## 2011-03-29 DIAGNOSIS — S8253XA Displaced fracture of medial malleolus of unspecified tibia, initial encounter for closed fracture: Secondary | ICD-10-CM | POA: Insufficient documentation

## 2011-03-29 DIAGNOSIS — Y92009 Unspecified place in unspecified non-institutional (private) residence as the place of occurrence of the external cause: Secondary | ICD-10-CM | POA: Insufficient documentation

## 2011-03-29 DIAGNOSIS — S52539A Colles' fracture of unspecified radius, initial encounter for closed fracture: Secondary | ICD-10-CM | POA: Insufficient documentation

## 2011-03-29 DIAGNOSIS — S80219A Abrasion, unspecified knee, initial encounter: Secondary | ICD-10-CM

## 2011-03-29 DIAGNOSIS — S52599A Other fractures of lower end of unspecified radius, initial encounter for closed fracture: Secondary | ICD-10-CM

## 2011-03-29 DIAGNOSIS — E079 Disorder of thyroid, unspecified: Secondary | ICD-10-CM | POA: Insufficient documentation

## 2011-03-29 DIAGNOSIS — E119 Type 2 diabetes mellitus without complications: Secondary | ICD-10-CM | POA: Insufficient documentation

## 2011-03-29 HISTORY — DX: Disorder of thyroid, unspecified: E07.9

## 2011-03-29 MED ORDER — OXYCODONE-ACETAMINOPHEN 5-325 MG PO TABS: 6.0000 | ORAL_TABLET | ORAL | Status: DC | PRN

## 2011-03-29 MED ORDER — OXYCODONE-ACETAMINOPHEN 5-325 MG PO TABS: 2.0000 | ORAL_TABLET | ORAL | Status: AC | PRN

## 2011-03-29 MED ORDER — OXYCODONE-ACETAMINOPHEN 5-325 MG PO TABS
1.0000 | ORAL_TABLET | Freq: Once | ORAL | Status: AC
  Administered 2011-03-29: 1 via ORAL

## 2011-03-29 NOTE — ED Provider Notes (Signed)
History     CSN: [id number] Arrival date & time: 03/29/2011  6:40 PM  Chief Complaint  Patient presents with  . Fall  . Leg Pain  . Knee Pain  . Arm Injury   Patient is a 76 y.o. female presenting with fall, leg pain, knee pain, and arm injury. The history is provided by the patient.  Fall The accident occurred less than 1 hour ago. Distance fallen: She tripped going up a ramp. She has very poor balance. The pain is present in the left wrist and left knee (She is also complaining of pain in her left ankle and left first toe.). The pain is at a severity of 9/10. The pain is severe. She was not ambulatory at the scene. There was no entrapment after the fall. There was no drug use involved in the accident. There was no alcohol use involved in the accident. The symptoms are aggravated by pressure on the injury and use of the injured limb.  Leg Pain   Knee Pain  Arm Injury     Past Medical History  Diagnosis Date  . Diabetes mellitus   . Thyroid disease     No past surgical history on file.  No family history on file.  History  Substance Use Topics  . Smoking status: Never Smoker   . Smokeless tobacco: Not on file  . Alcohol Use: No    OB History    Grav Para Term Preterm Abortions TAB SAB Ect Mult Living                  Review of Systems  All other systems reviewed and are negative.    Physical Exam  BP 117/58  Pulse 94  Temp(Src) 98.2 F (36.8 C) (Oral)  Resp 18  SpO2 96%  Physical Exam  Nursing note and vitals reviewed. Constitutional: She is oriented to person, place, and time. She appears well-developed and well-nourished.       Obese.  HENT:  Head: Normocephalic and atraumatic.  Right Ear: External ear normal.  Left Ear: External ear normal.  Nose: Nose normal.  Mouth/Throat: Oropharynx is clear and moist.  Eyes: Conjunctivae and EOM are normal. Pupils are equal, round, and reactive to light. No scleral icterus.  Neck: Normal range of motion. Neck  supple. No JVD present.  Cardiovascular: Normal rate, regular rhythm and normal heart sounds.   No murmur heard. Pulmonary/Chest: Effort normal and breath sounds normal. She has no wheezes. She has no rales.  Abdominal: Soft. Bowel sounds are normal. She exhibits no mass. There is no tenderness.  Musculoskeletal: She exhibits no edema.       Moderate tenderness, marked pain on ROM left wrist. Mild swelling and tenderness left knee, but marked pain on ROM. Very tender left first toe with no swelling seen. Minimal tenderness left ankle - no instability. No tenderness of the spine, hips or pelvis. Neurovascular is intact.  Lymphadenopathy:    She has no cervical adenopathy.  Neurological: She is alert and oriented to person, place, and time. No cranial nerve deficit. Coordination normal.  Skin: Skin is warm and dry. Erythema: Minor abrasion over the left knee in the infrapatellar area.  Psychiatric: She has a normal mood and affect.    ED Course  ORTHOPEDIC INJURY TREATMENT Date/Time: 03/29/2011 9:51 PM Performed by: Dione Booze Authorized by: Preston Fleeting, Berneice Zettlemoyer Consent: Verbal consent obtained. Written consent not obtained. Risks and benefits: risks, benefits and alternatives were discussed Consent given by: patient Patient  understanding: patient states understanding of the procedure being performed Patient consent: the patient's understanding of the procedure matches consent given Procedure consent: procedure consent matches procedure scheduled Relevant documents: relevant documents present and verified Test results: test results available and properly labeled Site marked: the operative site was not marked Imaging studies: imaging studies available Required items: required blood products, implants, devices, and special equipment available Patient identity confirmed: verbally with patient and arm band Time out: Immediately prior to procedure a "time out" was called to verify the correct  patient, procedure, equipment, support staff and site/side marked as required. Injury location: wrist Location details: left wrist Injury type: fracture Pre-procedure neurovascular assessment: neurovascularly intact Pre-procedure distal perfusion: normal Pre-procedure neurological function: normal Pre-procedure range of motion: reduced Local anesthesia used: no Patient sedated: no Manipulation performed: no Immobilization: splint Splint type: volar short arm Supplies used: Ortho-Glass Post-procedure neurovascular assessment: post-procedure neurovascularly intact Post-procedure distal perfusion: normal Post-procedure neurological function: normal Patient tolerance: Patient tolerated the procedure well with no immediate complications. Comments: She also has an avulsion fracture of the left medial malleolus, and is placed in a cam walker.    MDM Fall with poor balance, injuries of left arm and leg. She requests referral to Dr. Thurston Hole in [place].      Dione Booze, MD 03/29/11 2218

## 2011-03-29 NOTE — ED Notes (Signed)
Patient to ER via [facility] s/p fall. States patient fell in her yard and was assisted back up by her neighbor.  Patient c/o left leg, left knee and left forearm pain.  Patient denies hitting her head or feeling dizzy before she fell.

## 2011-05-08 ENCOUNTER — Emergency Department (HOSPITAL_COMMUNITY)
Admission: EM | Admit: 2011-05-08 | Discharge: 2011-05-08 | Disposition: A | Discharge status: Home / Self Care | Payer: PRIVATE HEALTH INSURANCE | Attending: Emergency Medicine | Admitting: Emergency Medicine

## 2011-05-08 ENCOUNTER — Encounter (HOSPITAL_COMMUNITY): Payer: Self-pay | Admitting: *Deleted

## 2011-05-08 DIAGNOSIS — Z4689 Encounter for fitting and adjustment of other specified devices: Secondary | ICD-10-CM

## 2011-05-08 DIAGNOSIS — Z4789 Encounter for other orthopedic aftercare: Secondary | ICD-10-CM | POA: Insufficient documentation

## 2011-05-08 DIAGNOSIS — M79609 Pain in unspecified limb: Secondary | ICD-10-CM | POA: Insufficient documentation

## 2011-05-08 HISTORY — DX: Essential (primary) hypertension: I10

## 2011-05-08 NOTE — ED Notes (Signed)
Pt states that the pain is much better now. Wiggles fingers without difficulty. Fingers warm to touch and pink. Wrist immobilizer in place.

## 2011-05-08 NOTE — ED Notes (Signed)
Pt alert and oriented x 3. Skin warm and dry. Color pink. Pt on 02 at 2L/min via [place]. Pt c/o pain and burning in her left arm. Short arm fiberglass cast in place. Wiggles fingers without difficulty. Fingers warm to touch and pink. Brisk capillary refill. Left arm elevated.

## 2011-05-08 NOTE — ED Notes (Signed)
Pt c/o pain and swelling in her left forearm and fingers. Pt had cast put on her left arm on Monday. Swelling noted to her fingers. Fingers left hand warm to touch. Color pink. Able to wiggle fingers. Brisk capillary refill noted.

## 2011-05-08 NOTE — ED Provider Notes (Signed)
History     CSN: [id number] Arrival date & time: 05/08/2011  5:05 PM  Chief Complaint  Patient presents with  . Arm Pain   Patient is a 76 y.o. female presenting with arm pain.  Arm Pain   status post fracture of left wrist, unknown bone. Patient was placed in a forearm cast yesterday at Dr. Sherene Sires office in [place]. Patient states wrist is hurting her more. Feels cast is on too tight. Able to wiggle her fingers. Meters are warm.  Past Medical History  Diagnosis Date  . Diabetes mellitus   . Thyroid disease   . Hypertension     Past Surgical History  Procedure Date  . Tubal ligation   . Ovary removed   . Knee arthroscopy     left    History reviewed. No pertinent family history.  History  Substance Use Topics  . Smoking status: Never Smoker   . Smokeless tobacco: Current User    Types: Snuff  . Alcohol Use: No    OB History    Grav Para Term Preterm Abortions TAB SAB Ect Mult Living                  Review of Systems  All other systems reviewed and are negative.    Physical Exam  BP 125/55  Pulse 106  Temp(Src) 97.9 F (36.6 C) (Oral)  Resp 22  Ht 5\' 1"  (1.549 m)  Wt 234 lb (106.142 kg)  BMI 44.21 kg/m2  SpO2 93%  Physical Exam  Constitutional: She is oriented to person, place, and time. She appears well-developed and well-nourished.  HENT:  Head: Normocephalic.  Musculoskeletal:       Left forearm cast examined. Patient points to the distal radial aspect of cast. Good capillary flow to the fingers.  Neurological: She is alert and oriented to person, place, and time.  Skin: Skin is warm and dry.  Psychiatric: She has a normal mood and affect.    ED Course  Procedures procedure note: Left forearm cast removed with oscillating cast saw. Cast removed without difficulty. Patient has slight abrasion on distal radial aspect of skin. neurovascular intact  MDM Cast removed by examiner. Will follow up with orthopedics. A forearm splint  applied.      Donnetta Hutching, MD 05/08/11 [phone]

## 2012-01-22 ENCOUNTER — Encounter: Payer: Self-pay | Admitting: Internal Medicine

## 2012-01-23 ENCOUNTER — Other Ambulatory Visit (INDEPENDENT_AMBULATORY_CARE_PROVIDER_SITE_OTHER): Payer: Medicare Other

## 2012-01-23 ENCOUNTER — Encounter: Payer: Self-pay | Admitting: Pulmonary Disease

## 2012-01-23 ENCOUNTER — Ambulatory Visit (INDEPENDENT_AMBULATORY_CARE_PROVIDER_SITE_OTHER): Payer: Medicare Other | Admitting: Pulmonary Disease

## 2012-01-23 ENCOUNTER — Ambulatory Visit (INDEPENDENT_AMBULATORY_CARE_PROVIDER_SITE_OTHER)
Admission: RE | Admit: 2012-01-23 | Discharge: 2012-01-23 | Disposition: A | Discharge status: Home / Self Care | Payer: Medicare Other | Source: Ambulatory Visit | Attending: Pulmonary Disease | Admitting: Pulmonary Disease

## 2012-01-23 VITALS — BP 112/74 | HR 104 | Temp 98.5°F | Ht 61.0 in | Wt 221.2 lb

## 2012-01-23 DIAGNOSIS — E079 Disorder of thyroid, unspecified: Secondary | ICD-10-CM | POA: Insufficient documentation

## 2012-01-23 DIAGNOSIS — E119 Type 2 diabetes mellitus without complications: Secondary | ICD-10-CM | POA: Insufficient documentation

## 2012-01-23 DIAGNOSIS — R0600 Dyspnea, unspecified: Secondary | ICD-10-CM

## 2012-01-23 DIAGNOSIS — R0989 Other specified symptoms and signs involving the circulatory and respiratory systems: Secondary | ICD-10-CM

## 2012-01-23 DIAGNOSIS — I499 Cardiac arrhythmia, unspecified: Secondary | ICD-10-CM

## 2012-01-23 DIAGNOSIS — J9611 Chronic respiratory failure with hypoxia: Secondary | ICD-10-CM

## 2012-01-23 DIAGNOSIS — G4733 Obstructive sleep apnea (adult) (pediatric): Secondary | ICD-10-CM

## 2012-01-23 DIAGNOSIS — J309 Allergic rhinitis, unspecified: Secondary | ICD-10-CM

## 2012-01-23 DIAGNOSIS — I10 Essential (primary) hypertension: Secondary | ICD-10-CM

## 2012-01-23 DIAGNOSIS — K219 Gastro-esophageal reflux disease without esophagitis: Secondary | ICD-10-CM | POA: Insufficient documentation

## 2012-01-23 DIAGNOSIS — R0609 Other forms of dyspnea: Secondary | ICD-10-CM

## 2012-01-23 HISTORY — DX: Chronic respiratory failure with hypoxia: J96.11

## 2012-01-23 LAB — CBC WITH DIFFERENTIAL/PLATELET
Basophils Relative: 0.2 % (ref 0.0–3.0)
Eosinophils Relative: 3.8 % (ref 0.0–5.0)
HCT: 35.1 % — ABNORMAL LOW (ref 36.0–46.0)
Hemoglobin: 10.5 g/dL — ABNORMAL LOW (ref 12.0–15.0)
Lymphocytes Relative: 13.2 % (ref 12.0–46.0)
Lymphs Abs: 2.3 10*3/uL (ref 0.7–4.0)
Monocytes Relative: 11.4 % (ref 3.0–12.0)
Neutro Abs: 12.4 10*3/uL — ABNORMAL HIGH (ref 1.4–7.7)
RBC: 5.45 Mil/uL — ABNORMAL HIGH (ref 3.87–5.11)
RDW: 18.5 % — ABNORMAL HIGH (ref 11.5–14.6)

## 2012-01-23 LAB — COMPREHENSIVE METABOLIC PANEL
ALT: 19 U/L (ref 0–35)
BUN: 17 mg/dL (ref 6–23)
CO2: 30 mEq/L (ref 19–32)
Calcium: 9.8 mg/dL (ref 8.4–10.5)
Chloride: 93 mEq/L — ABNORMAL LOW (ref 96–112)
Creatinine, Ser: 0.7 mg/dL (ref 0.4–1.2)
GFR: 86.76 mL/min (ref >60.00)
Total Bilirubin: 0.5 mg/dL (ref 0.3–1.2)

## 2012-01-23 NOTE — Assessment & Plan Note (Signed)
She is to continue 2 liters oxygen 24/7.  Will assess nocturnal oxygen needs with sleep study.

## 2012-01-23 NOTE — Progress Notes (Deleted)
  Subjective:    Patient ID: [name], female    DOB: 24-Aug-1935, 77 y.o.   MRN: [id number]  HPI    Review of Systems  Constitutional: Negative.   HENT: Positive for congestion, sneezing and trouble swallowing.   Eyes: Negative.   Respiratory: Positive for cough and shortness of breath.   Cardiovascular: Negative.   Gastrointestinal:       Heartburn   Genitourinary: Negative.   Musculoskeletal: Positive for arthralgias.  Skin: Negative.   Neurological: Positive for headaches.  Hematological: Negative.   Psychiatric/Behavioral: The patient is nervous/anxious.        Objective:   Physical Exam        Assessment & Plan:

## 2012-01-23 NOTE — Patient Instructions (Signed)
Lab test and chest xray today Will schedule breathing test Will schedule sleep test at [facility] Will get records from Dr. Blanchie Dessert office Follow up in 3 to 4 weeks

## 2012-01-23 NOTE — Assessment & Plan Note (Signed)
Will get records from Dr. Blanchie Dessert office to see if she had recent Echo.

## 2012-01-23 NOTE — Assessment & Plan Note (Signed)
She has chronic progressive dyspnea.  She has history of second hand tobacco exposure.  She has history of hypertension.  She is morbidly obese, and inactive.  To further assess will check labs, PFT, and chest xray.  Will also get records from Dr. Blanchie Dessert office, and then determine if she may need further evaluation for right heart pressures.

## 2012-01-23 NOTE — Assessment & Plan Note (Signed)
Explained how her weight and inactivity are likely contributing to her dyspnea.   

## 2012-01-23 NOTE — Progress Notes (Signed)
Chief Complaint  Patient presents with  . Sleep consult    Epworth score is 2...sleep study done a few years ago at [facility]....on 2L O2 24/7...never on CPAP.    History of Present Illness: [name] is a 77 y.o. female for evaluation of sleep apnea and dyspnea.  She had a sleep study in 2006.  This showed moderate sleep apnea.  She was tried on CPAP, but not able to tolerate this.  She felt like she was getting smothered.    She goes to bed at 10 pm.  She takes xanax and vicodin at night.  She wakes up several times during the night.  She gets vivid dreams.  She does talk in her sleep.  She is not sure if she snores.  She has trouble sleeping on her back.  She is tired during the day, and can fall asleep easily when sitting quiet.  She is not using anything to help her stay awake.  The patient denies sleep walking, sleep talking, bruxism, or nightmares.  There is no history of restless legs.  The patient denies sleep hallucinations, sleep paralysis, or cataplexy.  She has noticed her breathing getting worse since 2005 after she was on avandia. She has been using oxygen at 2 liters 24/7 for years.  She never smoked, but her husband was a heavy smoker.  She is followed by Dr. Rennis Golden with cardiology for hypertension and irregular heart beat.  She was never told that she had heart failure or clots.  She was never told she has asthma, but she does have a symbicort inhaler and albuterol nebulizer.  These don't help her breathing, but make her feel funny.  She has not had recent chest xray, lab work, or breathing tests.   Past Medical History  Diagnosis Date  . Diabetes mellitus   . Thyroid disease   . Hypertension   . Shingles   . GERD (gastroesophageal reflux disease)   . Abnormal heart rhythms     Past Surgical History  Procedure Date  . Tubal ligation   . Ovary removed   . Knee arthroscopy     left    Current Outpatient Prescriptions on File Prior to Visit  Medication Sig  Dispense Refill  . ALPRAZolam (XANAX) 0.5 MG tablet Take 0.5 mg by mouth 4 (four) times daily as needed. For anxiety      . B Complex Vitamins (VITAMIN B-COMPLEX 100) INJ Inject 1 each as directed every 30 (thirty) days. On the 10th of every month       . Brimonidine Tartrate (ALPHAGAN P OP) Place 1 drop into both eyes daily.       Marland Kitchen ezetimibe (ZETIA) 10 MG tablet Take 10 mg by mouth at bedtime.        . fexofenadine (ALLERGY RELIEF) 180 MG tablet Take 180 mg by mouth daily.      . fish oil-omega-3 fatty acids 1000 MG capsule Take 1 g by mouth 2 (two) times daily.        Marland Kitchen glipiZIDE (GLUCOTROL) 10 MG tablet Take 10 mg by mouth 2 (two) times daily before a meal.        . HYDROcodone-acetaminophen (VICODIN) 5-500 MG per tablet Take 1 tablet by mouth 2 (two) times daily as needed. For pain       . lansoprazole (PREVACID) 30 MG capsule Take 30 mg by mouth daily.        Marland Kitchen losartan-hydrochlorothiazide (HYZAAR) 100-25 MG per tablet Take  1 tablet by mouth daily.        Marland Kitchen NIFEdipine (PROCARDIA XL/ADALAT-CC) 30 MG 24 hr tablet Take 30 mg by mouth daily.        . pravastatin (PRAVACHOL) 20 MG tablet Take 20 mg by mouth daily.      . sitaGLIPtin (JANUVIA) 100 MG tablet Take 100 mg by mouth at bedtime.        . Travoprost, BAK Free, (TRAVATAMN) 0.004 % SOLN ophthalmic solution Place 1 drop into both eyes 2 (two) times daily.          Allergies  Allergen Reactions  . Diphenhydramine Hcl Other (See Comments)    Glaucoma prevents patient from taking this medication   . Aloe Vera Rash  . Penicillins Swelling and Rash    family history is not on file.   reports that she has never smoked. Her smokeless tobacco use includes Snuff. She reports that she does not drink alcohol or use illicit drugs.  Review of Systems Constitutional: Negative.   HENT: Positive for congestion, sneezing and trouble swallowing.   Eyes: Negative.   Respiratory: Positive for cough and shortness of breath.   Cardiovascular:  Negative.   Gastrointestinal:       Heartburn Genitourinary: Negative.   Musculoskeletal: Positive for arthralgias.  Skin: Negative.   Neurological: Positive for headaches.  Hematological: Negative.   Psychiatric/Behavioral: The patient is nervous/anxious.    Physical Exam: BP 112/74  Pulse 104  Temp(Src) 98.5 F (36.9 C) (Oral)  Ht 5\' 1"  (1.549 m)  Wt 221 lb 3.2 oz (100.336 kg)  BMI 41.80 kg/m2  SpO2 94% Body mass index is 41.80 kg/(m^2).   General - Using supplemental oxygen HEENT - Wears glasses, no sinus tenderness, MP 3, no oral exudate, no LAN Cardiac - s1s2 regular, no murmur Chest - decreased breath sounds, no wheeze/rales Abdomen - obese, soft, non tender, + bowel sounds Extremities - no edema Neurologic - normal strength, CN intact Skin - no rashes Psychiatric - normal mood, behavior  Assessment/Plan:  Outpatient Encounter Prescriptions as of 01/23/2012  Medication Sig Dispense Refill  . ALPRAZolam (XANAX) 0.5 MG tablet Take 0.5 mg by mouth 4 (four) times daily as needed. For anxiety      . B Complex Vitamins (VITAMIN B-COMPLEX 100) INJ Inject 1 each as directed every 30 (thirty) days. On the 10th of every month       . Brimonidine Tartrate (ALPHAGAN P OP) Place 1 drop into both eyes daily.       Marland Kitchen docusate sodium (COLACE) 50 MG capsule Take by mouth 2 (two) times daily.      Marland Kitchen ezetimibe (ZETIA) 10 MG tablet Take 10 mg by mouth at bedtime.        . fexofenadine (ALLERGY RELIEF) 180 MG tablet Take 180 mg by mouth daily.      . fish oil-omega-3 fatty acids 1000 MG capsule Take 1 g by mouth 2 (two) times daily.        Marland Kitchen glipiZIDE (GLUCOTROL) 10 MG tablet Take 10 mg by mouth 2 (two) times daily before a meal.        . HYDROcodone-acetaminophen (VICODIN) 5-500 MG per tablet Take 1 tablet by mouth 2 (two) times daily as needed. For pain       . lansoprazole (PREVACID) 30 MG capsule Take 30 mg by mouth daily.        Marland Kitchen levothyroxine (SYNTHROID, LEVOTHROID) 125 MCG  tablet Take 125 mcg by mouth daily.      Marland Kitchen  losartan-hydrochlorothiazide (HYZAAR) 100-25 MG per tablet Take 1 tablet by mouth daily.        Marland Kitchen NIFEdipine (PROCARDIA XL/ADALAT-CC) 30 MG 24 hr tablet Take 30 mg by mouth daily.        . pravastatin (PRAVACHOL) 20 MG tablet Take 20 mg by mouth daily.      . sitaGLIPtin (JANUVIA) 100 MG tablet Take 100 mg by mouth at bedtime.        . Travoprost, BAK Free, (TRAVATAMN) 0.004 % SOLN ophthalmic solution Place 1 drop into both eyes 2 (two) times daily.        Marland Kitchen DISCONTD: levothyroxine (SYNTHROID, LEVOTHROID) 150 MCG tablet Take 150 mcg by mouth daily.          Riddick Nuon Pager:  [phone] 01/23/2012, 2:39 PM

## 2012-01-23 NOTE — Assessment & Plan Note (Signed)
She has sleep disruption, daytimes sleepiness, and snoring.  She has history of hypertension and diabetes.  She has prior study from 2006 which showed moderate sleep apnea.  She was not able to tolerate initial CPAP set up, but this may be related to sub-optimal pressure settings.  She likely still has sleep apnea.  I have explained how sleep apnea can affect the patient's health.  Driving precautions and importance of weight loss were discussed.  Treatment options for sleep apnea were reviewed.  She is willing to have further interventions for this.  Will arrange for repeat in lab sleep study.  She has requested this be done at [facility]

## 2012-01-27 ENCOUNTER — Telehealth: Payer: Self-pay | Admitting: Pulmonary Disease

## 2012-01-27 NOTE — Telephone Encounter (Signed)
Dg Chest 2 View  01/23/2012  *RADIOLOGY REPORT*  Clinical Data: Shortness of breath.  CHEST - 2 VIEW  Comparison: Plain films of the chest 06/23/2009 and 02/16/2010.  Findings: Minimal linear scar is again seen in the lingula.  Lungs otherwise clear.  Heart size is normal.  No pneumothorax or effusion.  IMPRESSION: No acute disease.  Stable compared to prior exam.  Original Report Authenticated By: Bernadene Bell. D'ALESSIO, M.D.    CBC    Component Value Date/Time   WBC 17.4* 01/23/2012 1543   RBC 5.45* 01/23/2012 1543   HGB 10.5* 01/23/2012 1543   HCT 35.1* 01/23/2012 1543   PLT 284.0 01/23/2012 1543   MCV 64.4 Repeated and verified X2.* 01/23/2012 1543   MCHC 30.0 01/23/2012 1543   RDW 18.5* 01/23/2012 1543   LYMPHSABS 2.3 01/23/2012 1543   MONOABS 2.0* 01/23/2012 1543   EOSABS 0.7 01/23/2012 1543   BASOSABS 0.0 01/23/2012 1543    BMET    Component Value Date/Time   NA 134* 01/23/2012 1543   K 3.8 01/23/2012 1543   CL 93* 01/23/2012 1543   CO2 30 01/23/2012 1543   GLUCOSE 211* 01/23/2012 1543   BUN 17 01/23/2012 1543   CREATININE 0.7 01/23/2012 1543   CALCIUM 9.8 01/23/2012 1543   GFRNONAA >60 01/18/2010 1129   GFRAA  Value: >60        The eGFR has been calculated using the MDRD equation. This calculation has not been validated in all clinical situations. eGFR's persistently <60 mL/min signify possible Chronic Kidney Disease. 01/18/2010 1129    Lab Results  Component Value Date   ALT 19 01/23/2012   AST 25 01/23/2012   ALKPHOS 97 01/23/2012   BILITOT 0.5 01/23/2012     Results d/w pt.  Advised her to discuss anemia with her PCP to further assess.

## 2012-01-29 ENCOUNTER — Ambulatory Visit: Payer: Medicare Other | Attending: Pulmonary Disease | Admitting: Sleep Medicine

## 2012-01-29 DIAGNOSIS — E119 Type 2 diabetes mellitus without complications: Secondary | ICD-10-CM | POA: Insufficient documentation

## 2012-01-29 DIAGNOSIS — I10 Essential (primary) hypertension: Secondary | ICD-10-CM | POA: Insufficient documentation

## 2012-01-29 DIAGNOSIS — Z6841 Body Mass Index (BMI) 40.0 and over, adult: Secondary | ICD-10-CM | POA: Insufficient documentation

## 2012-01-29 DIAGNOSIS — G4733 Obstructive sleep apnea (adult) (pediatric): Secondary | ICD-10-CM | POA: Insufficient documentation

## 2012-01-30 NOTE — Progress Notes (Signed)
Split night study ordered to began with 2 L/M supplemental O2 initiated as ordered, however split protocol; was not met AHI <30 in 2 hrs TST. SUpplemental O2 was increased to 3 L/M at epoch 339 per protocol.

## 2012-02-23 DIAGNOSIS — G4733 Obstructive sleep apnea (adult) (pediatric): Secondary | ICD-10-CM

## 2012-02-23 DIAGNOSIS — E119 Type 2 diabetes mellitus without complications: Secondary | ICD-10-CM

## 2012-02-23 DIAGNOSIS — I10 Essential (primary) hypertension: Secondary | ICD-10-CM

## 2012-02-23 DIAGNOSIS — Z6841 Body Mass Index (BMI) 40.0 and over, adult: Secondary | ICD-10-CM

## 2012-02-23 NOTE — Procedures (Signed)
Kari, Lane              ACCOUNT NO.:  [id number]  MEDICAL RECORD NO.:  [id number]          PATIENT TYPE:  OUT  LOCATION:  SLEEP CENTER                 FACILITY:  [facility]  PHYSICIAN:  Coralyn Helling, MD        DATE OF BIRTH:  05/15/1935  DATE OF STUDY:  02/28/2012                           NOCTURNAL POLYSOMNOGRAM  REFERRING PHYSICIAN:  Coralyn Helling, MD  REFERRING PHYSICIAN:  Coralyn Helling, MD  INDICATION:  [name] is a 77 year old female who has a history of diabetes and hypertension.  She was previously diagnosed with obstructive sleep apnea in 2006.  She was intolerant of CPAP therapy at that time.  She has been using supplemental oxygen at 2 L, 24 hours a day.  She continued to have difficulties with sleep, snoring, and daytime sleepiness.  She is referred to the sleep lab for further evaluation of hypersomnia with obstructive sleep apnea.  Height is 5 feet 1 inches, weight is 227 pounds, BMI is 43, neck size is 17 inches.  MEDICATIONS:  Alprazolam, vitamin B, Colace, Zetia, fexofenadine, fish oil, glipizide, oxycodone, lansoprazole, levothyroxine, Hyzaar, nifedipine, pravastatin, Januvia.  The patient took alprazolam and oxycodone on the night of the study.  EPWORTH SLEEPINESS SCORE:  3.  SLEEP ARCHITECTURE:  Total recording time was 405 minutes.  Total sleep time was 239 minutes.  Total sleep efficiency was 58%.  Sleep latency was 63 minutes.  REM latency was 183 minutes.  The patient was observed in all stages of sleep and slept exclusively in the supine position.  RESPIRATORY DATA:  The average respiratory rate was 20.  Loud snoring was noted by the technician.  The overall apnea/hypopnea index was 2.5. The respiratory disturbance index was 3.8.  The events were exclusively obstructive in nature.  OXYGEN DATA:  The study was started with the patient using 2 L of supplemental oxygen.  The baseline oxygenation was 92%.  The oxygen saturation nadir was 81%.  The  patient had her oxygen increased to 3 L supplemental oxygen with improvement in her oxygenation.  Prior to this, she spent 159 minutes with an oxygen saturation below 88%.  CARDIAC DATA:  The average heart rate was 91 and the rhythm strip showed sinus rhythm with occasional PVCs.  MOVEMENT/PARASOMNIA:  The periodic limb movement index was 28 and the patient had 2 restroom trips.  IMPRESSION:  This study did not show evidence for obstructive sleep apnea as her overall apnea/hypopnea index was 2.5.  She did have evidence for sleep-related hypoventilation.  She required the use of 3 L of supplement oxygen while asleep.  With this, she did have improvement in her oxygen saturations.  She had a increase in her periodic limb movement index and clinical correlation would be necessary to determine the significance of this.  In addition to diet, exercise, and weight reduction, I would recommend that the patient be continued on 3 L of supplemental oxygen while asleep.     Coralyn Helling, MD Diplomat, American Board of Sleep Medicine    VS/MEDQ  D:  02/23/2012 12:14:17  T:  02/23/2012 45:40:98  Job:  [id number]

## 2012-02-25 ENCOUNTER — Telehealth: Payer: Self-pay | Admitting: Pulmonary Disease

## 2012-02-25 DIAGNOSIS — G4733 Obstructive sleep apnea (adult) (pediatric): Secondary | ICD-10-CM

## 2012-02-25 NOTE — Telephone Encounter (Signed)
I spoke with patient about results and she verbalized understanding and had no questions 

## 2012-02-25 NOTE — Telephone Encounter (Signed)
PSG 01/29/12>>AHI 2.5, PLMI 28.1.  Needed 3 liters oxygen.  Will have my nurse inform patient that sleep study did not show sleep apnea.  She did have trouble with low oxygen, and needs to use 3 liters oxygen at night (she has been using 2 liters).    Will discuss in more detail at next visit.

## 2012-02-26 ENCOUNTER — Ambulatory Visit (INDEPENDENT_AMBULATORY_CARE_PROVIDER_SITE_OTHER): Payer: Medicare Other | Admitting: Pulmonary Disease

## 2012-02-26 ENCOUNTER — Encounter: Payer: Self-pay | Admitting: Pulmonary Disease

## 2012-02-26 VITALS — BP 122/68 | HR 114 | Temp 98.0°F | Ht 61.0 in | Wt 228.4 lb

## 2012-02-26 DIAGNOSIS — R0600 Dyspnea, unspecified: Secondary | ICD-10-CM

## 2012-02-26 DIAGNOSIS — J9611 Chronic respiratory failure with hypoxia: Secondary | ICD-10-CM

## 2012-02-26 DIAGNOSIS — J9610 Chronic respiratory failure, unspecified whether with hypoxia or hypercapnia: Secondary | ICD-10-CM

## 2012-02-26 DIAGNOSIS — R0989 Other specified symptoms and signs involving the circulatory and respiratory systems: Secondary | ICD-10-CM

## 2012-02-26 DIAGNOSIS — G4733 Obstructive sleep apnea (adult) (pediatric): Secondary | ICD-10-CM

## 2012-02-26 DIAGNOSIS — R0902 Hypoxemia: Secondary | ICD-10-CM

## 2012-02-26 NOTE — Assessment & Plan Note (Signed)
Likely related to obesity and hypoventilation.  She is to continue 2 liters oxygen during day, and 3 liters at night.

## 2012-02-26 NOTE — Assessment & Plan Note (Signed)
Her sleep study from June 2013 did not show sleep apnea.

## 2012-02-26 NOTE — Assessment & Plan Note (Signed)
Explained how her weight and inactivity are likely contributing to her dyspnea.

## 2012-02-26 NOTE — Patient Instructions (Addendum)
Will arrange for breathing test (PFT) and call with results Use 3 liters oxygen at night Follow up in 4 months

## 2012-02-26 NOTE — Progress Notes (Signed)
Chief Complaint  Patient presents with  . Follow-up    Pt states she still gets SOB, very little cough. denies anywheezing and chest tx. Pt has not started using 3L at night yet . pt did not get PFT scheduled    History of Present Illness: [name] is a 77 y.o. female with dyspnea, OSA/OHS, morbid obesity  She had PSG 01/29/12>>AHI 2.5, PLMI 28.1. Needed 3 liters oxygen.  She has started iron pills for anemia.  She denies cough, wheeze, or chest congestion.  She continues to get winded with minimal activity.   Past Medical History  Diagnosis Date  . Diabetes mellitus   . Thyroid disease   . Hypertension   . Shingles   . GERD (gastroesophageal reflux disease)   . Abnormal heart rhythms     Past Surgical History  Procedure Date  . Tubal ligation   . Ovary removed   . Knee arthroscopy     left    Outpatient Encounter Prescriptions as of 02/26/2012  Medication Sig Dispense Refill  . ALPRAZolam (XANAX) 0.5 MG tablet Take 0.5 mg by mouth 4 (four) times daily as needed. For anxiety      . B Complex Vitamins (VITAMIN B-COMPLEX 100) INJ Inject 1 each as directed every 30 (thirty) days. On the 10th of every month       . Brimonidine Tartrate (ALPHAGAN P OP) Place 1 drop into both eyes daily.       Marland Kitchen docusate sodium (COLACE) 50 MG capsule Take by mouth 2 (two) times daily.      Marland Kitchen ezetimibe (ZETIA) 10 MG tablet Take 10 mg by mouth at bedtime.        . fexofenadine (ALLERGY RELIEF) 180 MG tablet Take 180 mg by mouth daily.      . fish oil-omega-3 fatty acids 1000 MG capsule Take 1 g by mouth 2 (two) times daily.        Marland Kitchen glipiZIDE (GLUCOTROL) 10 MG tablet Take 10 mg by mouth 2 (two) times daily before a meal.        . HYDROcodone-acetaminophen (VICODIN) 5-500 MG per tablet Take 1 tablet by mouth 2 (two) times daily as needed. For pain       . lansoprazole (PREVACID) 30 MG capsule Take 30 mg by mouth daily.        Marland Kitchen levothyroxine (SYNTHROID, LEVOTHROID) 125 MCG tablet Take 125  mcg by mouth daily.      Marland Kitchen losartan-hydrochlorothiazide (HYZAAR) 100-25 MG per tablet Take 1 tablet by mouth daily.        Marland Kitchen NIFEdipine (PROCARDIA XL/ADALAT-CC) 30 MG 24 hr tablet Take 30 mg by mouth daily.        . pravastatin (PRAVACHOL) 20 MG tablet Take 20 mg by mouth daily.      . sitaGLIPtin (JANUVIA) 100 MG tablet Take 100 mg by mouth at bedtime.        . Travoprost, BAK Free, (TRAVATAMN) 0.004 % SOLN ophthalmic solution Place 1 drop into both eyes 2 (two) times daily.          Allergies  Allergen Reactions  . Diphenhydramine Hcl Other (See Comments)    Glaucoma prevents patient from taking this medication   . Aloe Vera Rash  . Penicillins Swelling and Rash    Physical Exam:  Blood pressure 122/68, pulse 114, temperature 98 F (36.7 C), temperature source Oral, height 5\' 1"  (1.549 m), weight 228 lb 6.4 oz (103.602 kg), SpO2 91.00%.  Body mass  index is 43.16 kg/(m^2).  Wt Readings from Last 2 Encounters:  02/26/12 228 lb 6.4 oz (103.602 kg)  01/23/12 221 lb 3.2 oz (100.336 kg)    General - Using supplemental oxygen  HEENT - Wears glasses, no sinus tenderness, MP 3, no oral exudate, no LAN  Cardiac - s1s2 regular, no murmur  Chest - decreased breath sounds, no wheeze/rales  Abdomen - obese, soft, non tender, + bowel sounds  Extremities - no edema  Neurologic - normal strength, CN intact  Skin - no rashes  Psychiatric - normal mood, behavior   Lab Results  Component Value Date   WBC 17.4* 01/23/2012   HGB 10.5* 01/23/2012   HCT 35.1* 01/23/2012   MCV 64.4 Repeated and verified X2.* 01/23/2012   PLT 284.0 01/23/2012   Lab Results  Component Value Date   CREATININE 0.7 01/23/2012   BUN 17 01/23/2012   NA 134* 01/23/2012   K 3.8 01/23/2012   CL 93* 01/23/2012   CO2 30 01/23/2012   Lab Results  Component Value Date   ALT 19 01/23/2012   AST 25 01/23/2012   ALKPHOS 97 01/23/2012   BILITOT 0.5 01/23/2012    CHEST - 2 VIEW 01/23/12 Comparison: Plain films of the chest  06/23/2009 and 02/16/2010.  Findings: Minimal linear scar is again seen in the lingula. Lungs otherwise clear. Heart size is normal. No pneumothorax or effusion.  IMPRESSION:  No acute disease. Stable compared to prior exam.  Original Report Authenticated By: Bernadene Bell. Maricela Curet, M.D.    Assessment/Plan:  Coralyn Helling, MD [place] [facility] Pager:  [phone] 02/26/2012, 3:27 PM

## 2012-02-26 NOTE — Assessment & Plan Note (Addendum)
She has chronic progressive dyspnea.  She has history of second hand tobacco exposure.  She has history of hypertension.  She is morbidly obese, and inactive.  She has anemia, and has been started on iron supplementation.  She did not have PFT's yet>>will reschedule these and call with results.

## 2012-03-10 ENCOUNTER — Ambulatory Visit (INDEPENDENT_AMBULATORY_CARE_PROVIDER_SITE_OTHER): Payer: Medicare Other | Admitting: Pulmonary Disease

## 2012-03-10 DIAGNOSIS — J9610 Chronic respiratory failure, unspecified whether with hypoxia or hypercapnia: Secondary | ICD-10-CM

## 2012-03-10 DIAGNOSIS — R0902 Hypoxemia: Secondary | ICD-10-CM

## 2012-03-10 DIAGNOSIS — J9611 Chronic respiratory failure with hypoxia: Secondary | ICD-10-CM

## 2012-03-10 DIAGNOSIS — R0989 Other specified symptoms and signs involving the circulatory and respiratory systems: Secondary | ICD-10-CM

## 2012-03-10 DIAGNOSIS — R0600 Dyspnea, unspecified: Secondary | ICD-10-CM

## 2012-03-10 LAB — PULMONARY FUNCTION TEST

## 2012-03-10 NOTE — Progress Notes (Signed)
PFT done today. 

## 2012-04-01 ENCOUNTER — Encounter: Payer: Self-pay | Admitting: Pulmonary Disease

## 2012-04-30 ENCOUNTER — Other Ambulatory Visit (HOSPITAL_COMMUNITY): Payer: Self-pay | Admitting: Internal Medicine

## 2012-04-30 DIAGNOSIS — Z0000 Encounter for general adult medical examination without abnormal findings: Secondary | ICD-10-CM

## 2012-05-01 ENCOUNTER — Ambulatory Visit (HOSPITAL_COMMUNITY)
Admission: RE | Admit: 2012-05-01 | Discharge: 2012-05-01 | Disposition: A | Discharge status: Home / Self Care | Payer: Medicare Other | Source: Ambulatory Visit | Attending: Internal Medicine | Admitting: Internal Medicine

## 2012-05-01 DIAGNOSIS — Z0000 Encounter for general adult medical examination without abnormal findings: Secondary | ICD-10-CM

## 2012-05-01 DIAGNOSIS — Z1231 Encounter for screening mammogram for malignant neoplasm of breast: Secondary | ICD-10-CM | POA: Insufficient documentation

## 2012-05-04 ENCOUNTER — Ambulatory Visit (HOSPITAL_COMMUNITY): Payer: Medicare Other

## 2012-05-05 ENCOUNTER — Other Ambulatory Visit: Payer: Self-pay | Admitting: Internal Medicine

## 2012-05-05 DIAGNOSIS — R928 Other abnormal and inconclusive findings on diagnostic imaging of breast: Secondary | ICD-10-CM

## 2012-05-20 ENCOUNTER — Ambulatory Visit (HOSPITAL_COMMUNITY)
Admission: RE | Admit: 2012-05-20 | Discharge: 2012-05-20 | Disposition: A | Discharge status: Home / Self Care | Payer: Medicare Other | Source: Ambulatory Visit | Attending: Internal Medicine | Admitting: Internal Medicine

## 2012-05-20 DIAGNOSIS — R928 Other abnormal and inconclusive findings on diagnostic imaging of breast: Secondary | ICD-10-CM | POA: Insufficient documentation

## 2012-07-02 ENCOUNTER — Encounter: Payer: Self-pay | Admitting: Pulmonary Disease

## 2012-07-02 ENCOUNTER — Ambulatory Visit (INDEPENDENT_AMBULATORY_CARE_PROVIDER_SITE_OTHER): Payer: Medicare Other | Admitting: Pulmonary Disease

## 2012-07-02 VITALS — BP 140/80 | HR 106 | Temp 98.7°F | Ht 61.0 in | Wt 227.4 lb

## 2012-07-02 DIAGNOSIS — J9610 Chronic respiratory failure, unspecified whether with hypoxia or hypercapnia: Secondary | ICD-10-CM

## 2012-07-02 DIAGNOSIS — G4733 Obstructive sleep apnea (adult) (pediatric): Secondary | ICD-10-CM

## 2012-07-02 DIAGNOSIS — R0902 Hypoxemia: Secondary | ICD-10-CM

## 2012-07-02 DIAGNOSIS — J9611 Chronic respiratory failure with hypoxia: Secondary | ICD-10-CM

## 2012-07-02 DIAGNOSIS — R0609 Other forms of dyspnea: Secondary | ICD-10-CM

## 2012-07-02 DIAGNOSIS — R0600 Dyspnea, unspecified: Secondary | ICD-10-CM

## 2012-07-02 NOTE — Assessment & Plan Note (Signed)
She is to continue 2 liters oxygen during the day, and 3 liters at night.  Explained that she will likely need to continue supplemental oxygen indefinitely unless she can lose weight.

## 2012-07-02 NOTE — Progress Notes (Signed)
Chief Complaint  Patient presents with  . Follow-up    SOB has unchanged. no cough, wheezing, chest tx. uses 3 liters oxygen at night and 2 liters during the day. discuss breathing test from [place]    CC: [name]  History of Present Illness: [name] is a 77 y.o. female with dyspnea, OSA/OHS, morbid obesity, diastolic CHF and deconditioning.  Her breathing is the same.  She is able to do very limit activity.  When she does activity she gets winded and feels her heart rate pick up.  She recovers after a few minutes once she rests.  She denies chest pain, cough, wheeze, or leg swelling.  She is using 2 liters oxygen during the day and 3 liters at night.  Tests: Echo 09/19/09>>mild LVH, EF 55%, diastolic dysfx, mild MR CXR 01/23/12>>minimal scar in lingula, otherwise clear lungs PSG 01/29/12>>AHI 2.5, PLMI 28.1. Needed 3 liters oxygen. PFT 03/10/12>>FEV1 1.65 (100%), FEV1% 78, TLC 3.96 (95%), DLCO 53, no BD  Past Medical History  Diagnosis Date  . Diabetes mellitus   . Thyroid disease   . Hypertension   . Shingles   . GERD (gastroesophageal reflux disease)   . Abnormal heart rhythms     Past Surgical History  Procedure Date  . Tubal ligation   . Ovary removed   . Knee arthroscopy     left    Outpatient Encounter Prescriptions as of 07/02/2012  Medication Sig Dispense Refill  . ALPRAZolam (XANAX) 0.5 MG tablet Take 0.5 mg by mouth 4 (four) times daily as needed. For anxiety      . B Complex Vitamins (VITAMIN B-COMPLEX 100) INJ Inject 1 each as directed every 30 (thirty) days. On the 10th of every month       . Brimonidine Tartrate (ALPHAGAN P OP) Place 1 drop into both eyes daily.       Marland Kitchen docusate sodium (COLACE) 50 MG capsule Take by mouth 2 (two) times daily.      Marland Kitchen ezetimibe (ZETIA) 10 MG tablet Take 10 mg by mouth at bedtime.        . fexofenadine (ALLERGY RELIEF) 180 MG tablet Take 180 mg by mouth daily.      . fish oil-omega-3 fatty acids 1000 MG capsule Take 1  g by mouth 2 (two) times daily.        Marland Kitchen glipiZIDE (GLUCOTROL) 10 MG tablet Take 10 mg by mouth 2 (two) times daily before a meal.        . HYDROcodone-acetaminophen (VICODIN) 5-500 MG per tablet Take 1 tablet by mouth 2 (two) times daily as needed. For pain       . lansoprazole (PREVACID) 30 MG capsule Take 30 mg by mouth daily.        Marland Kitchen levothyroxine (SYNTHROID, LEVOTHROID) 125 MCG tablet Take 125 mcg by mouth daily.      Marland Kitchen losartan-hydrochlorothiazide (HYZAAR) 100-25 MG per tablet Take 1 tablet by mouth daily.        Marland Kitchen NIFEdipine (PROCARDIA XL/ADALAT-CC) 30 MG 24 hr tablet Take 30 mg by mouth daily.        . pravastatin (PRAVACHOL) 20 MG tablet Take 20 mg by mouth daily.      . sitaGLIPtin (JANUVIA) 100 MG tablet Take 100 mg by mouth at bedtime.        . Travoprost, BAK Free, (TRAVATAMN) 0.004 % SOLN ophthalmic solution Place 1 drop into both eyes 2 (two) times daily.  Allergies  Allergen Reactions  . Diphenhydramine Hcl Other (See Comments)    Glaucoma prevents patient from taking this medication   . Aloe Vera Rash  . Penicillins Swelling and Rash    Physical Exam:  Filed Vitals:   07/02/12 1339 07/02/12 1340  BP:  140/80  Pulse:  106  Temp: 98.7 F (37.1 C)   TempSrc: Oral   Height: 5\' 1"  (1.549 m)   Weight: 227 lb 6.4 oz (103.148 kg)   SpO2:  90%    Body mass index is 42.97 kg/(m^2).   Wt Readings from Last 2 Encounters:  07/02/12 227 lb 6.4 oz (103.148 kg)  02/26/12 228 lb 6.4 oz (103.602 kg)    General - Using supplemental oxygen  HEENT - No sinus tenderness, MP 3, no oral exudate, no LAN  Cardiac - s1s2 regular, no murmur  Chest - decreased breath sounds, no wheeze/rales  Abdomen - obese, soft, non tender  Extremities - no edema  Neurologic - normal strength Skin - no rashes  Psychiatric - normal mood, behavior   Assessment/Plan:  Coralyn Helling, MD [place] [facility] Pager:  [phone] 07/02/2012, 1:41 PM

## 2012-07-02 NOTE — Patient Instructions (Signed)
Follow up in 6 months 

## 2012-07-02 NOTE — Addendum Note (Signed)
Addended by: Tommie Sams on: 07/02/2012 05:23 PM   Modules accepted: Orders

## 2012-07-02 NOTE — Assessment & Plan Note (Signed)
Related to OHS, morbid obesity, diastolic CHF, and deconditioning.  Advised her to d/w cardiology if repeat echo is needed, and whether she could start gradual exercise program.  She had isolated diffusion defect on PFT, but this corrected for lung volumes.  Her recent chest xray was unremarkable.

## 2012-09-22 ENCOUNTER — Other Ambulatory Visit (HOSPITAL_COMMUNITY): Payer: Self-pay | Admitting: Internal Medicine

## 2012-09-22 ENCOUNTER — Ambulatory Visit (HOSPITAL_COMMUNITY)
Admission: RE | Admit: 2012-09-22 | Discharge: 2012-09-22 | Disposition: A | Discharge status: Home / Self Care | Payer: Medicare HMO | Source: Ambulatory Visit | Attending: Internal Medicine | Admitting: Internal Medicine

## 2012-09-22 DIAGNOSIS — R05 Cough: Secondary | ICD-10-CM

## 2012-09-22 DIAGNOSIS — R0602 Shortness of breath: Secondary | ICD-10-CM | POA: Insufficient documentation

## 2012-09-22 DIAGNOSIS — R059 Cough, unspecified: Secondary | ICD-10-CM | POA: Insufficient documentation

## 2012-10-13 ENCOUNTER — Emergency Department (HOSPITAL_COMMUNITY): Payer: Medicare Other

## 2012-10-13 ENCOUNTER — Encounter (HOSPITAL_COMMUNITY): Payer: Self-pay | Admitting: *Deleted

## 2012-10-13 ENCOUNTER — Inpatient Hospital Stay (HOSPITAL_COMMUNITY)
Admission: EM | Admit: 2012-10-13 | Discharge: 2012-10-20 | DRG: 292 | Disposition: A | Discharge status: Home / Self Care | Payer: Medicare Other | Attending: Internal Medicine | Admitting: Internal Medicine

## 2012-10-13 DIAGNOSIS — I5033 Acute on chronic diastolic (congestive) heart failure: Secondary | ICD-10-CM

## 2012-10-13 DIAGNOSIS — J449 Chronic obstructive pulmonary disease, unspecified: Secondary | ICD-10-CM | POA: Diagnosis present

## 2012-10-13 DIAGNOSIS — I2729 Other secondary pulmonary hypertension: Secondary | ICD-10-CM

## 2012-10-13 DIAGNOSIS — E079 Disorder of thyroid, unspecified: Secondary | ICD-10-CM

## 2012-10-13 DIAGNOSIS — IMO0001 Reserved for inherently not codable concepts without codable children: Secondary | ICD-10-CM | POA: Diagnosis present

## 2012-10-13 DIAGNOSIS — Z6841 Body Mass Index (BMI) 40.0 and over, adult: Secondary | ICD-10-CM

## 2012-10-13 DIAGNOSIS — I509 Heart failure, unspecified: Secondary | ICD-10-CM

## 2012-10-13 DIAGNOSIS — Z888 Allergy status to other drugs, medicaments and biological substances status: Secondary | ICD-10-CM

## 2012-10-13 DIAGNOSIS — I2789 Other specified pulmonary heart diseases: Secondary | ICD-10-CM | POA: Diagnosis present

## 2012-10-13 DIAGNOSIS — I10 Essential (primary) hypertension: Secondary | ICD-10-CM | POA: Diagnosis present

## 2012-10-13 DIAGNOSIS — J9611 Chronic respiratory failure with hypoxia: Secondary | ICD-10-CM | POA: Diagnosis present

## 2012-10-13 DIAGNOSIS — J4489 Other specified chronic obstructive pulmonary disease: Secondary | ICD-10-CM | POA: Diagnosis present

## 2012-10-13 DIAGNOSIS — Z8249 Family history of ischemic heart disease and other diseases of the circulatory system: Secondary | ICD-10-CM

## 2012-10-13 DIAGNOSIS — R0902 Hypoxemia: Secondary | ICD-10-CM | POA: Diagnosis present

## 2012-10-13 DIAGNOSIS — Z79899 Other long term (current) drug therapy: Secondary | ICD-10-CM

## 2012-10-13 DIAGNOSIS — E039 Hypothyroidism, unspecified: Secondary | ICD-10-CM | POA: Diagnosis present

## 2012-10-13 DIAGNOSIS — J9610 Chronic respiratory failure, unspecified whether with hypoxia or hypercapnia: Secondary | ICD-10-CM | POA: Diagnosis present

## 2012-10-13 DIAGNOSIS — E662 Morbid (severe) obesity with alveolar hypoventilation: Secondary | ICD-10-CM | POA: Diagnosis present

## 2012-10-13 DIAGNOSIS — I272 Pulmonary hypertension, unspecified: Secondary | ICD-10-CM | POA: Diagnosis present

## 2012-10-13 DIAGNOSIS — K219 Gastro-esophageal reflux disease without esophagitis: Secondary | ICD-10-CM | POA: Diagnosis present

## 2012-10-13 DIAGNOSIS — E785 Hyperlipidemia, unspecified: Secondary | ICD-10-CM | POA: Diagnosis present

## 2012-10-13 DIAGNOSIS — G4733 Obstructive sleep apnea (adult) (pediatric): Secondary | ICD-10-CM | POA: Diagnosis present

## 2012-10-13 DIAGNOSIS — E119 Type 2 diabetes mellitus without complications: Secondary | ICD-10-CM | POA: Diagnosis present

## 2012-10-13 DIAGNOSIS — Z7982 Long term (current) use of aspirin: Secondary | ICD-10-CM

## 2012-10-13 DIAGNOSIS — R7989 Other specified abnormal findings of blood chemistry: Secondary | ICD-10-CM

## 2012-10-13 DIAGNOSIS — Z880 Allergy status to penicillin: Secondary | ICD-10-CM

## 2012-10-13 HISTORY — DX: Chronic respiratory failure with hypoxia: J96.11

## 2012-10-13 HISTORY — DX: Pulmonary hypertension, unspecified: I27.20

## 2012-10-13 HISTORY — DX: Chronic obstructive pulmonary disease, unspecified: J44.9

## 2012-10-13 HISTORY — DX: Morbid (severe) obesity due to excess calories: E66.01

## 2012-10-13 LAB — CBC
HCT: 51.3 % — ABNORMAL HIGH (ref 36.0–46.0)
Hemoglobin: 16.7 g/dL — ABNORMAL HIGH (ref 12.0–15.0)
MCH: 29.7 pg (ref 26.0–34.0)
MCHC: 32.6 g/dL (ref 30.0–36.0)
MCV: 91 fL (ref 78.0–100.0)
Platelets: 135 10*3/uL — ABNORMAL LOW (ref 150–400)
RBC: 5.42 MIL/uL — ABNORMAL HIGH (ref 3.87–5.11)
WBC: 12.5 10*3/uL — ABNORMAL HIGH (ref 4.0–10.5)

## 2012-10-13 LAB — COMPREHENSIVE METABOLIC PANEL
ALT: 19 U/L (ref 0–35)
Albumin: 4 g/dL (ref 3.5–5.2)
Alkaline Phosphatase: 84 U/L (ref 39–117)
BUN: 47 mg/dL — ABNORMAL HIGH (ref 6–23)
Chloride: 97 mEq/L (ref 96–112)
GFR calc Af Amer: 55 mL/min — ABNORMAL LOW (ref >90)
Glucose, Bld: 188 mg/dL — ABNORMAL HIGH (ref 70–99)
Potassium: 4.3 mEq/L (ref 3.5–5.1)
Sodium: 137 mEq/L (ref 135–145)
Total Bilirubin: 0.9 mg/dL (ref 0.3–1.2)
Total Protein: 6.8 g/dL (ref 6.0–8.3)

## 2012-10-13 LAB — PRO B NATRIURETIC PEPTIDE: Pro B Natriuretic peptide (BNP): 5030 pg/mL — ABNORMAL HIGH (ref 0–450)

## 2012-10-13 LAB — URINE MICROSCOPIC-ADD ON

## 2012-10-13 LAB — TROPONIN I
Troponin I: 0.87 ng/mL (ref <0.30)
Troponin I: 0.66 ng/mL (ref <0.30)

## 2012-10-13 LAB — URINALYSIS, ROUTINE W REFLEX MICROSCOPIC
Bilirubin Urine: NEGATIVE
Glucose, UA: NEGATIVE mg/dL
Ketones, ur: NEGATIVE mg/dL
Protein, ur: NEGATIVE mg/dL
pH: 5 (ref 5.0–8.0)

## 2012-10-13 LAB — CREATININE, SERUM: GFR calc Af Amer: 55 mL/min — ABNORMAL LOW (ref >90)

## 2012-10-13 LAB — POCT I-STAT TROPONIN I

## 2012-10-13 MED ORDER — EZETIMIBE 10 MG PO TABS
10.0000 mg | ORAL_TABLET | Freq: Every day | ORAL | Status: DC
  Administered 2012-10-14 – 2012-10-19 (×7): 10 mg via ORAL
  Filled 2012-10-13 (×8): qty 1

## 2012-10-13 MED ORDER — FUROSEMIDE 10 MG/ML IJ SOLN
40.0000 mg | Freq: Three times a day (TID) | INTRAMUSCULAR | Status: AC
  Administered 2012-10-14 – 2012-10-15 (×4): 40 mg via INTRAVENOUS
  Filled 2012-10-13 (×4): qty 4

## 2012-10-13 MED ORDER — LOSARTAN POTASSIUM-HCTZ 100-25 MG PO TABS: 1.0000 | ORAL_TABLET | Freq: Every day | ORAL | Status: DC

## 2012-10-13 MED ORDER — ASPIRIN 81 MG PO CHEW
324.0000 mg | CHEWABLE_TABLET | Freq: Once | ORAL | Status: AC
  Administered 2012-10-13: 324 mg via ORAL
  Filled 2012-10-13: qty 4

## 2012-10-13 MED ORDER — LOSARTAN POTASSIUM 50 MG PO TABS
100.0000 mg | ORAL_TABLET | Freq: Every day | ORAL | Status: DC
  Administered 2012-10-14 – 2012-10-20 (×7): 100 mg via ORAL
  Filled 2012-10-13 (×7): qty 2

## 2012-10-13 MED ORDER — SODIUM CHLORIDE 0.9 % IJ SOLN
3.0000 mL | Freq: Two times a day (BID) | INTRAMUSCULAR | Status: DC
  Administered 2012-10-14 – 2012-10-20 (×11): 3 mL via INTRAVENOUS

## 2012-10-13 MED ORDER — DOCUSATE SODIUM 50 MG PO CAPS
50.0000 mg | ORAL_CAPSULE | Freq: Two times a day (BID) | ORAL | Status: DC
  Administered 2012-10-14 – 2012-10-20 (×14): 50 mg via ORAL
  Filled 2012-10-13 (×17): qty 1

## 2012-10-13 MED ORDER — HYDROCHLOROTHIAZIDE 25 MG PO TABS
25.0000 mg | ORAL_TABLET | Freq: Every day | ORAL | Status: DC
  Administered 2012-10-14: 25 mg via ORAL
  Filled 2012-10-13: qty 1

## 2012-10-13 MED ORDER — NIFEDIPINE ER 30 MG PO TB24
30.0000 mg | ORAL_TABLET | Freq: Every day | ORAL | Status: DC
  Administered 2012-10-14 – 2012-10-15 (×2): 30 mg via ORAL
  Filled 2012-10-13 (×2): qty 1

## 2012-10-13 MED ORDER — ACETAMINOPHEN 325 MG PO TABS: 650.0000 mg | ORAL_TABLET | ORAL | Status: DC | PRN

## 2012-10-13 MED ORDER — PANTOPRAZOLE SODIUM 40 MG PO TBEC
40.0000 mg | DELAYED_RELEASE_TABLET | Freq: Every day | ORAL | Status: DC
  Administered 2012-10-14 – 2012-10-20 (×8): 40 mg via ORAL
  Filled 2012-10-13 (×7): qty 1

## 2012-10-13 MED ORDER — HEPARIN SODIUM (PORCINE) 5000 UNIT/ML IJ SOLN
5000.0000 [IU] | Freq: Three times a day (TID) | INTRAMUSCULAR | Status: DC
  Administered 2012-10-14 – 2012-10-20 (×21): 5000 [IU] via SUBCUTANEOUS
  Filled 2012-10-13 (×23): qty 1

## 2012-10-13 MED ORDER — TRAVOPROST (BAK FREE) 0.004 % OP SOLN
1.0000 [drp] | Freq: Two times a day (BID) | OPHTHALMIC | Status: DC
  Administered 2012-10-14 – 2012-10-20 (×14): 1 [drp] via OPHTHALMIC
  Filled 2012-10-13: qty 2.5

## 2012-10-13 MED ORDER — GLIPIZIDE 10 MG PO TABS
10.0000 mg | ORAL_TABLET | Freq: Two times a day (BID) | ORAL | Status: DC
  Administered 2012-10-14 – 2012-10-20 (×13): 10 mg via ORAL
  Filled 2012-10-13 (×15): qty 1

## 2012-10-13 MED ORDER — LORATADINE 10 MG PO TABS
10.0000 mg | ORAL_TABLET | Freq: Every day | ORAL | Status: DC
  Administered 2012-10-14 – 2012-10-20 (×7): 10 mg via ORAL
  Filled 2012-10-13 (×7): qty 1

## 2012-10-13 MED ORDER — ONDANSETRON HCL 4 MG/2ML IJ SOLN: 4.0000 mg | Freq: Four times a day (QID) | INTRAMUSCULAR | Status: DC | PRN

## 2012-10-13 MED ORDER — ASPIRIN EC 81 MG PO TBEC
81.0000 mg | DELAYED_RELEASE_TABLET | Freq: Every day | ORAL | Status: DC
  Administered 2012-10-14 – 2012-10-20 (×7): 81 mg via ORAL
  Filled 2012-10-13 (×8): qty 1

## 2012-10-13 MED ORDER — FUROSEMIDE 10 MG/ML IJ SOLN
80.0000 mg | Freq: Once | INTRAMUSCULAR | Status: AC
  Administered 2012-10-13: 80 mg via INTRAVENOUS
  Filled 2012-10-13: qty 8

## 2012-10-13 MED ORDER — SIMVASTATIN 10 MG PO TABS
10.0000 mg | ORAL_TABLET | Freq: Every day | ORAL | Status: DC
  Administered 2012-10-14 – 2012-10-19 (×6): 10 mg via ORAL
  Filled 2012-10-13 (×7): qty 1

## 2012-10-13 MED ORDER — SODIUM CHLORIDE 0.9 % IJ SOLN
3.0000 mL | INTRAMUSCULAR | Status: DC | PRN
  Administered 2012-10-14: 3 mL via INTRAVENOUS

## 2012-10-13 MED ORDER — SODIUM CHLORIDE 0.9 % IV SOLN: 250.0000 mL | INTRAVENOUS | Status: DC | PRN

## 2012-10-13 MED ORDER — ALPRAZOLAM 0.5 MG PO TABS
0.5000 mg | ORAL_TABLET | Freq: Four times a day (QID) | ORAL | Status: DC | PRN
  Administered 2012-10-15 – 2012-10-19 (×6): 0.5 mg via ORAL
  Filled 2012-10-13 (×2): qty 2
  Filled 2012-10-13 (×2): qty 1
  Filled 2012-10-13 (×2): qty 2

## 2012-10-13 MED ORDER — OXYCODONE-ACETAMINOPHEN 5-325 MG PO TABS
1.0000 | ORAL_TABLET | ORAL | Status: DC | PRN
  Administered 2012-10-18: 1 via ORAL
  Filled 2012-10-13: qty 1

## 2012-10-13 MED ORDER — LEVOTHYROXINE SODIUM 150 MCG PO TABS
150.0000 ug | ORAL_TABLET | Freq: Every day | ORAL | Status: DC
  Administered 2012-10-14 – 2012-10-20 (×7): 150 ug via ORAL
  Filled 2012-10-13 (×9): qty 1

## 2012-10-13 MED ORDER — FUROSEMIDE 10 MG/ML IJ SOLN: 40.0000 mg | Freq: Three times a day (TID) | INTRAMUSCULAR | Status: DC

## 2012-10-13 MED ORDER — BRIMONIDINE TARTRATE 0.2 % OP SOLN
1.0000 [drp] | Freq: Two times a day (BID) | OPHTHALMIC | Status: DC
  Administered 2012-10-14 – 2012-10-20 (×14): 1 [drp] via OPHTHALMIC
  Filled 2012-10-13: qty 5

## 2012-10-13 MED ORDER — LINAGLIPTIN 5 MG PO TABS
5.0000 mg | ORAL_TABLET | Freq: Every day | ORAL | Status: DC
  Administered 2012-10-14 – 2012-10-20 (×7): 5 mg via ORAL
  Filled 2012-10-13 (×7): qty 1

## 2012-10-13 MED ORDER — OMEGA-3-ACID ETHYL ESTERS 1 G PO CAPS
1.0000 g | ORAL_CAPSULE | Freq: Two times a day (BID) | ORAL | Status: DC
  Administered 2012-10-14 – 2012-10-20 (×14): 1 g via ORAL
  Filled 2012-10-13 (×15): qty 1

## 2012-10-13 NOTE — ED Notes (Signed)
0.87 Troponin received from [facility] in lab.

## 2012-10-13 NOTE — H&P (Addendum)
[facility] History and Physical  ARRINGTON BENCOMO HQI:[id number] DOB: 02-22-1935 DOA: 10/13/2012  Referring physician: ED PCP: Cassell Smiles., MD  Specialists: [facility] cards, [place] [facility]  Chief Complaint: SOB  HPI: [name] is a 78 y.o. female who presents to the ED with c/o shorness of breath, onset over the past 2-3 weeks, slowely worsening during that time.  The patient reports associated new symptoms of orthopnea (requiring 3 pillows at night to breath), and peripheral edema in BLE.  She does not have a history of CHF in the past she reports although she did have a cardiac cath in 2011 which was apparently negative for disease.  In the ED she was found to be in CHF, and also have slightly elevated troponins of 0.87.  Cardiology was called by ED and they felt that troponin elevation was likely due to CHF, hospitalist will admit the patient for [facility].  Review of Systems: No fevers, chills, chest pain, 12 systems reviewed and otherwise negative.  Past Medical History  Diagnosis Date  . Diabetes mellitus   . Hypertension   . Shingles   . GERD (gastroesophageal reflux disease)   . Abnormal heart rhythms   . COPD (chronic obstructive pulmonary disease)   . Thyroid disease     hypthyroidism   Past Surgical History  Procedure Laterality Date  . Tubal ligation    . Ovary removed    . Knee arthroscopy      left   Social History:  reports that she has never smoked. Her smokeless tobacco use includes Snuff. She reports that she does not drink alcohol or use illicit drugs.   Allergies  Allergen Reactions  . Diphenhydramine Hcl Other (See Comments)    Glaucoma prevents patient from taking this medication   . Aloe Vera Rash  . Penicillins Swelling and Rash    Family History  Problem Relation Age of Onset  . Heart attack Father 16  . Heart disease Mother 47     Prior to Admission medications   Medication Sig Start Date End Date Taking? Authorizing Provider   ALPRAZolam Prudy Feeler) 0.5 MG tablet Take 0.5 mg by mouth 4 (four) times daily as needed for anxiety.    Yes Historical Provider, MD  Brimonidine Tartrate (ALPHAGAN P OP) Place 1 drop into both eyes 2 (two) times daily.    Yes Historical Provider, MD  docusate sodium (COLACE) 50 MG capsule Take 50 mg by mouth 2 (two) times daily.   Yes Historical Provider, MD  ezetimibe (ZETIA) 10 MG tablet Take 10 mg by mouth at bedtime.     Yes Historical Provider, MD  fexofenadine (ALLERGY RELIEF) 180 MG tablet Take 180 mg by mouth daily.   Yes Historical Provider, MD  fish oil-omega-3 fatty acids 1000 MG capsule Take 1 g by mouth 2 (two) times daily.     Yes Historical Provider, MD  glipiZIDE (GLUCOTROL) 10 MG tablet Take 10 mg by mouth 2 (two) times daily before a meal.     Yes Historical Provider, MD  lansoprazole (PREVACID) 30 MG capsule Take 30 mg by mouth daily.     Yes Historical Provider, MD  levothyroxine (SYNTHROID, LEVOTHROID) 150 MCG tablet Take 150 mcg by mouth daily.   Yes Historical Provider, MD  linagliptin (TRADJENTA) 5 MG TABS tablet Take 5 mg by mouth daily.   Yes Historical Provider, MD  losartan-hydrochlorothiazide (HYZAAR) 100-25 MG per tablet Take 1 tablet by mouth daily.     Yes Historical Provider, MD  NIFEdipine (PROCARDIA XL/ADALAT-CC) 30 MG 24 hr tablet Take 30 mg by mouth daily.     Yes Historical Provider, MD  oxyCODONE-acetaminophen (PERCOCET/ROXICET) 5-325 MG per tablet Take 1 tablet by mouth every 4 (four) hours as needed for pain.   Yes Historical Provider, MD  pravastatin (PRAVACHOL) 20 MG tablet Take 20 mg by mouth daily.   Yes Historical Provider, MD  Travoprost, BAK Free, (TRAVATAMN) 0.004 % SOLN ophthalmic solution Place 1 drop into both eyes 2 (two) times daily.     Yes Historical Provider, MD   Physical Exam: Filed Vitals:   10/13/12 1935 10/13/12 2000 10/13/12 2015 10/13/12 2038  BP: 135/75 121/71 117/74 125/79  Pulse: 108 102 98   Temp:      TempSrc:      Resp: 20 20  17 18   Height:      Weight:      SpO2: 93% 92% 93% 91%    General:  NAD, resting comfortably in bed Eyes: PEERLA EOMI ENT: mucous membranes moist Neck: supple w/o JVD Cardiovascular: RRR w/o MRG Respiratory: few wheezes bilaterally Abdomen: soft, nt, nd, bs+ Skin: 4+ BLE pitting edema Musculoskeletal: MAE, full ROM all 4 extremities Psychiatric: normal tone and affect Neurologic: AAOx3, grossly non-focal  Labs on Admission:  Basic Metabolic Panel:  Recent Labs Lab 10/13/12 1837  NA 137  K 4.3  CL 97  CO2 29  GLUCOSE 188*  BUN 47*  CREATININE 1.11*  CALCIUM 9.7   Liver Function Tests:  Recent Labs Lab 10/13/12 1837  AST 28  ALT 19  ALKPHOS 84  BILITOT 0.9  PROT 6.8  ALBUMIN 4.0   No results found for this basename: LIPASE, AMYLASE,  in the last 168 hours No results found for this basename: AMMONIA,  in the last 168 hours CBC:  Recent Labs Lab 10/13/12 1837  WBC 12.5*  HGB 16.7*  HCT 51.3*  MCV 91.4  PLT 149*   Cardiac Enzymes:  Recent Labs Lab 10/13/12 1915  TROPONINI 0.87*    BNP (last 3 results)  Recent Labs  10/13/12 1837  PROBNP 5030.0*   CBG: No results found for this basename: GLUCAP,  in the last 168 hours  Radiological Exams on Admission: Dg Chest Portable 1 View  10/13/2012  *RADIOLOGY REPORT*  Clinical Data: Shortness of breath.  PORTABLE CHEST - 1 VIEW  Comparison: 09/22/2012  Findings: There is cardiomegaly with vascular congestion and possible mild interstitial edema.  Bibasilar opacities and small effusions.  No acute bony abnormality.  IMPRESSION: Suspect mild interstitial edema/CHF.  Small bilateral effusions with bibasilar opacities, most likely atelectasis.   Original Report Authenticated By: Charlett Nose, M.D.     EKG: Independently reviewed.  Assessment/Plan Principal Problem:   Acute congestive heart failure Active Problems:   Chronic respiratory failure with hypoxia   DM2 (diabetes mellitus, type 2)   Thyroid  disease   Elevated troponin   1. Acute CHF - not yet clear wether this is systolic, diastolic, or both.  2D echo ordered to clarify.  Treating with Lasix got 80 lasix in ED, start 40mg  IV q8h 6 hours after first dose, strict is and os, check BMP in AM, replace K as needed.  Already on ACEi from home. 2. Elevated troponin - believed to be secondary to #1 and not representative of ACS, will trend troponins while here. 3. DM2 - continue home PO hypoglycemics and check CBGs AC/HS 4. Thyroid disease - continue synthroid and check TSH in setting of new  CHF. 5. HTN - continue home ARB, HCTZ 6. Dyslipidemia - continue home statin and zetia 7. COPD - appears to be stable at this point and not felt to be what is causing her SOB  Dr. Tresa Endo was spoken with by the ED.  Code Status: Full Code (must indicate code status--if unknown or must be presumed, indicate so) Family Communication: Spoke with multiple family members at bedside (indicate person spoken with, if applicable, with phone number if by telephone) Disposition Plan: Admit to inpatient (indicate anticipated LOS)  Time spent: 70 min  GARDNER, JARED M. [facility] Pager [phone]  If 7PM-7AM, please contact night-coverage [email or web address] Password [facility] 10/13/2012, 9:08 PM

## 2012-10-13 NOTE — ED Provider Notes (Signed)
History     CSN: [id number]  Arrival date & time 10/13/12  1815   First MD Initiated Contact with Patient 10/13/12 1841      Chief Complaint  Patient presents with  . Shortness of Breath  . Shoulder Pain  . Leg Swelling    (Consider location/radiation/quality/duration/timing/severity/associated sxs/prior treatment) HPI Comments: Discussion the ER for evaluation of increasing shortness of breath for a period of several weeks. Patient reports that she has been experiencing increased swelling of her lower extremities. She also complains of pain in the right shoulder region. Pain is mostly in the posterior back area. She has had some increased coughing. Patient has a history of COPD. Not getting relief from her nebulizer treatments.  Patient is a 78 y.o. female presenting with shortness of breath and shoulder pain.  Shortness of Breath Associated symptoms: cough   Associated symptoms: no fever   Shoulder Pain Associated symptoms include shortness of breath.    Past Medical History  Diagnosis Date  . Diabetes mellitus   . Hypertension   . Shingles   . GERD (gastroesophageal reflux disease)   . Abnormal heart rhythms   . COPD (chronic obstructive pulmonary disease)   . Thyroid disease     hypthyroidism    Past Surgical History  Procedure Laterality Date  . Tubal ligation    . Ovary removed    . Knee arthroscopy      left    No family history on file.  History  Substance Use Topics  . Smoking status: Never Smoker   . Smokeless tobacco: Current User    Types: Snuff  . Alcohol Use: No    OB History   Grav Para Term Preterm Abortions TAB SAB Ect Mult Living                  Review of Systems  Constitutional: Negative for fever.  Respiratory: Positive for cough and shortness of breath.   Cardiovascular: Positive for leg swelling.  All other systems reviewed and are negative.    Allergies  Diphenhydramine hcl; Aloe vera; and Penicillins  Home Medications    Current Outpatient Rx  Name  Route  Sig  Dispense  Refill  . ALPRAZolam (XANAX) 0.5 MG tablet   Oral   Take 0.5 mg by mouth 4 (four) times daily as needed. For anxiety         . B Complex Vitamins (VITAMIN B-COMPLEX 100) INJ   Injection   Inject 1 each as directed every 30 (thirty) days. On the 10th of every month          . Brimonidine Tartrate (ALPHAGAN P OP)   Both Eyes   Place 1 drop into both eyes daily.          Marland Kitchen docusate sodium (COLACE) 50 MG capsule   Oral   Take by mouth 2 (two) times daily.         Marland Kitchen ezetimibe (ZETIA) 10 MG tablet   Oral   Take 10 mg by mouth at bedtime.           . fexofenadine (ALLERGY RELIEF) 180 MG tablet   Oral   Take 180 mg by mouth daily.         . fish oil-omega-3 fatty acids 1000 MG capsule   Oral   Take 1 g by mouth 2 (two) times daily.           Marland Kitchen glipiZIDE (GLUCOTROL) 10 MG tablet   Oral  Take 10 mg by mouth 2 (two) times daily before a meal.           . HYDROcodone-acetaminophen (VICODIN) 5-500 MG per tablet   Oral   Take 1 tablet by mouth 2 (two) times daily as needed. For pain          . lansoprazole (PREVACID) 30 MG capsule   Oral   Take 30 mg by mouth daily.           Marland Kitchen levothyroxine (SYNTHROID, LEVOTHROID) 125 MCG tablet   Oral   Take 125 mcg by mouth daily.         Marland Kitchen losartan-hydrochlorothiazide (HYZAAR) 100-25 MG per tablet   Oral   Take 1 tablet by mouth daily.           Marland Kitchen NIFEdipine (PROCARDIA XL/ADALAT-CC) 30 MG 24 hr tablet   Oral   Take 30 mg by mouth daily.           . pravastatin (PRAVACHOL) 20 MG tablet   Oral   Take 20 mg by mouth daily.         . sitaGLIPtin (JANUVIA) 100 MG tablet   Oral   Take 100 mg by mouth at bedtime.           . Travoprost, BAK Free, (TRAVATAMN) 0.004 % SOLN ophthalmic solution   Both Eyes   Place 1 drop into both eyes 2 (two) times daily.             BP 121/59  Pulse 114  Temp(Src) 98.4 F (36.9 C) (Oral)  Resp 20  Ht 5\' 1"   (1.549 m)  Wt 240 lb (108.863 kg)  BMI 45.37 kg/m2  SpO2 88%  Physical Exam  Constitutional: She is oriented to person, place, and time. She appears well-developed and well-nourished. No distress.  HENT:  Head: Normocephalic and atraumatic.  Right Ear: Hearing normal.  Nose: Nose normal.  Mouth/Throat: Oropharynx is clear and moist and mucous membranes are normal.  Eyes: Conjunctivae and EOM are normal. Pupils are equal, round, and reactive to light.  Neck: Normal range of motion. Neck supple.  Cardiovascular: Normal rate, regular rhythm, S1 normal and S2 normal.  Exam reveals no gallop and no friction rub.   No murmur heard. Pulmonary/Chest: Effort normal. No respiratory distress. She has wheezes. She exhibits no tenderness.  Abdominal: Soft. Normal appearance and bowel sounds are normal. There is no hepatosplenomegaly. There is no tenderness. There is no rebound, no guarding, no tenderness at McBurney's point and negative Murphy's sign. No hernia.  Musculoskeletal: Normal range of motion.  Bilateral pedal edema  Neurological: She is alert and oriented to person, place, and time. She has normal strength. No cranial nerve deficit or sensory deficit. Coordination normal. GCS eye subscore is 4. GCS verbal subscore is 5. GCS motor subscore is 6.  Skin: Skin is warm, dry and intact. No rash noted. No cyanosis.  Psychiatric: She has a normal mood and affect. Her speech is normal and behavior is normal. Thought content normal.    ED Course  Procedures (including critical care time)   Date: 10/13/2012  Rate: 104  Rhythm: sinus tachycardia  QRS Axis: right  Intervals: normal  ST/T Wave abnormalities: nonspecific ST/T changes  Conduction Disutrbances:none and left posterior fascicular block  Narrative Interpretation:   Old EKG Reviewed: unchanged since 2011    Labs Reviewed  PRO B NATRIURETIC PEPTIDE - Abnormal; Notable for the following:    Pro B Natriuretic peptide (BNP) 5030.0 (*)  All other components within normal limits  CBC - Abnormal; Notable for the following:    WBC 12.5 (*)    RBC 5.61 (*)    Hemoglobin 16.7 (*)    HCT 51.3 (*)    Platelets 149 (*)    All other components within normal limits  COMPREHENSIVE METABOLIC PANEL - Abnormal; Notable for the following:    Glucose, Bld 188 (*)    BUN 47 (*)    Creatinine, Ser 1.11 (*)    GFR calc non Af Amer 47 (*)    GFR calc Af Amer 55 (*)    All other components within normal limits  TROPONIN I - Abnormal; Notable for the following:    Troponin I 0.87 (*)    All other components within normal limits  POCT I-STAT TROPONIN I - Abnormal; Notable for the following:    Troponin i, poc 0.62 (*)    All other components within normal limits  URINALYSIS, ROUTINE W REFLEX MICROSCOPIC   Dg Chest Portable 1 View  10/13/2012  *RADIOLOGY REPORT*  Clinical Data: Shortness of breath.  PORTABLE CHEST - 1 VIEW  Comparison: 09/22/2012  Findings: There is cardiomegaly with vascular congestion and possible mild interstitial edema.  Bibasilar opacities and small effusions.  No acute bony abnormality.  IMPRESSION: Suspect mild interstitial edema/CHF.  Small bilateral effusions with bibasilar opacities, most likely atelectasis.   Original Report Authenticated By: Charlett Nose, M.D.      Diagnosis: 1. Congestive heart failure 2. Elevated troponin    MDM  Patient presents to the ER for evaluation of progressively worsening shortness of breath over a period of 3 weeks. Patient has a history of COPD. She has increased her nasal cannula oxygen at home without much improvement. She also was not improving with nebulizer treatments. Patient indicated that she has been gaining weight, experiencing a lot of swelling in her lower extremities. Workup was consistent with congestive heart failure. She has an elevated troponin as well. The her records, however, reveals heart catheterization in 2011 that showed no coronary artery disease.  Troponin elevation is likely secondary to congestive heart failure. She will require hospitalization for further management.        Gilda Crease, MD 10/13/12 2008

## 2012-10-13 NOTE — ED Notes (Signed)
Call family if needed at [phone].Marland Kitchen

## 2012-10-13 NOTE — ED Notes (Signed)
Pt with hx of copd to ED c/o increasing sob and bil LE x 3 weeks, R scapula pain x 2 weeks and pain on urination today.  Pt states she normally sats 88% on 2 L so her MD put her up to 3L, but she has not been back.  2+ pitting edema to LE bil.  Pt sats of 88 on 3L, but able to speak in full sentences.

## 2012-10-14 ENCOUNTER — Encounter (HOSPITAL_COMMUNITY): Payer: Self-pay | Admitting: Cardiology

## 2012-10-14 DIAGNOSIS — I272 Pulmonary hypertension, unspecified: Secondary | ICD-10-CM | POA: Diagnosis present

## 2012-10-14 DIAGNOSIS — E039 Hypothyroidism, unspecified: Secondary | ICD-10-CM

## 2012-10-14 DIAGNOSIS — IMO0001 Reserved for inherently not codable concepts without codable children: Secondary | ICD-10-CM

## 2012-10-14 DIAGNOSIS — I5033 Acute on chronic diastolic (congestive) heart failure: Principal | ICD-10-CM

## 2012-10-14 LAB — GLUCOSE, CAPILLARY
Glucose-Capillary: 185 mg/dL — ABNORMAL HIGH (ref 70–99)
Glucose-Capillary: 165 mg/dL — ABNORMAL HIGH (ref 70–99)
Glucose-Capillary: 247 mg/dL — ABNORMAL HIGH (ref 70–99)

## 2012-10-14 LAB — BASIC METABOLIC PANEL
CO2: 32 mEq/L (ref 19–32)
Calcium: 9.6 mg/dL (ref 8.4–10.5)
Creatinine, Ser: 1.2 mg/dL — ABNORMAL HIGH (ref 0.50–1.10)

## 2012-10-14 LAB — MRSA PCR SCREENING: MRSA by PCR: NEGATIVE

## 2012-10-14 MED ORDER — INSULIN ASPART 100 UNIT/ML ~~LOC~~ SOLN
0.0000 [IU] | Freq: Three times a day (TID) | SUBCUTANEOUS | Status: DC
  Administered 2012-10-14 – 2012-10-15 (×2): 3 [IU] via SUBCUTANEOUS
  Administered 2012-10-15 – 2012-10-16 (×3): 1 [IU] via SUBCUTANEOUS
  Administered 2012-10-16: 2 [IU] via SUBCUTANEOUS
  Administered 2012-10-16: 3 [IU] via SUBCUTANEOUS
  Administered 2012-10-17: 5 [IU] via SUBCUTANEOUS
  Administered 2012-10-17: 1 [IU] via SUBCUTANEOUS
  Administered 2012-10-17: 3 [IU] via SUBCUTANEOUS
  Administered 2012-10-18: 1 [IU] via SUBCUTANEOUS
  Administered 2012-10-18 – 2012-10-19 (×3): 3 [IU] via SUBCUTANEOUS
  Administered 2012-10-19: 5 [IU] via SUBCUTANEOUS
  Administered 2012-10-19 – 2012-10-20 (×3): 2 [IU] via SUBCUTANEOUS

## 2012-10-14 MED ORDER — ALUM & MAG HYDROXIDE-SIMETH 200-200-20 MG/5ML PO SUSP
15.0000 mL | Freq: Four times a day (QID) | ORAL | Status: DC | PRN
  Administered 2012-10-14: 15 mL via ORAL
  Filled 2012-10-14: qty 30

## 2012-10-14 NOTE — Progress Notes (Signed)
  Echocardiogram 2D Echocardiogram has been performed.  [name] 10/14/2012, 3:38 PM

## 2012-10-14 NOTE — Progress Notes (Signed)
Inpatient Diabetes Program Recommendations  AACE/ADA: New Consensus Statement on Inpatient Glycemic Control (2013)  Target Ranges:  Prepandial:   less than 140 mg/dL      Peak postprandial:   less than 180 mg/dL (1-2 hours)      Critically ill patients:  140 - 180 mg/dL   Reason for Visit:  Results for [name], [name] (MRN [id number]) as of 10/14/2012 10:23  Ref. Range 10/14/2012 02:27 10/14/2012 07:53  Glucose-Capillary Latest Range: 70-99 mg/dL 782 (H) 956 (H)   Note history of diabetes.  Please consider adding moderate Novolog correction tid with meals.  Also, please check A1C.  Will follow.

## 2012-10-14 NOTE — Progress Notes (Signed)
[facility] PROGRESS NOTE  [name] ZOX:[id number] DOB: 12-06-1934 DOA: 10/13/2012 PCP: Cassell Smiles., MD  Brief narrative [name] is a 78 y.o. female who presents to the ED with c/o shorness of breath, onset over the past 2-3 weeks, slowely worsening during that time. The patient reports associated new symptoms of orthopnea (requiring 3 pillows at night to breath), and peripheral edema in BLE. She does not have a history of CHF in the past she reports although she did have a cardiac cath in 2011 which was apparently negative for disease.  In the ED she was found to be in CHF, and also have slightly elevated troponins of 0.87. Cardiology was called by ED and they felt that troponin elevation was likely due to CHF, hospitalist will admit the patient for [facility].   Assessment/Plan: 1. Acute on chronic diastolic CHF: Mild troponin elevation likely due to this. 2-D echo in 2011 shows EF greater than 55%. Clinically improving. Continue Lasix. Await cardiology input-discussed with [facility] team.  2. Elevated troponins: Likely secondary to decompensated CHF and less likely to be ACS. Cardiac cath in 2011 apparently negative. Await cardiology input. 3. Uncontrolled type II DM: We'll check hemoglobin A1c. 4. Hypothyroidism: Continue Synthroid and check TSH. 5. Hypertension: Reasonably controlled. Continue ARB. 6. Dyslipidemia: Continue home statins and CT of. 7. COPD: No clinical bronchospasm and not felt to because of her worsening dyspnea.  Code Status: Full Family Communication: Discussed with patient Disposition Plan: Home in medically stable   Consultants:  [facility]  Procedures:  None  Antibiotics:  None   HPI/Subjective: Dyspnea is better. Denies chest pain. Still has dyspnea on minimal exertion.  Objective: Filed Vitals:   10/14/12 0400 10/14/12 0500 10/14/12 0600 10/14/12 0700  BP: 105/61 109/66 122/84 112/60  Pulse: 95 96 106 96   Temp:      TempSrc:      Resp: 16 13 15 16   Height:      Weight:    112.7 kg (248 lb 7.3 oz)  SpO2: 94% 92% 84% 87%    Intake/Output Summary (Last 24 hours) at 10/14/12 0730 Last data filed at 10/14/12 0600  Gross per 24 hour  Intake    320 ml  Output   2300 ml  Net  -1980 ml   Filed Weights   10/13/12 1829 10/13/12 2300 10/14/12 0700  Weight: 108.863 kg (240 lb) 113.2 kg (249 lb 9 oz) 112.7 kg (248 lb 7.3 oz)    Exam:   General exam: Comfortable.  Respiratory system: Few bibasal crackles but otherwise clear to auscultation. No increased work of breathing.  Cardiovascular system: S1 & S2 heard, RRR. No JVD, murmurs, gallops, clicks. 3+ pitting bilateral lower extremity edema  Gastrointestinal system: Abdomen is nondistended, soft and nontender. Normal bowel sounds heard.  Central nervous system: Alert and oriented. No focal neurological deficits.  Extremities: Symmetric 5 x 5 power.   Data Reviewed: Basic Metabolic Panel:  Recent Labs Lab 10/13/12 1837 10/13/12 2114 10/14/12 0315  NA 137  --  137  K 4.3  --  4.1  CL 97  --  97  CO2 29  --  32  GLUCOSE 188*  --  188*  BUN 47*  --  47*  CREATININE 1.11* 1.10 1.20*  CALCIUM 9.7  --  9.6   Liver Function Tests:  Recent Labs Lab 10/13/12 1837  AST 28  ALT 19  ALKPHOS 84  BILITOT 0.9  PROT 6.8  ALBUMIN  4.0   No results found for this basename: LIPASE, AMYLASE,  in the last 168 hours No results found for this basename: AMMONIA,  in the last 168 hours CBC:  Recent Labs Lab 10/13/12 1837 10/13/12 2114  WBC 12.5* 11.7*  HGB 16.7* 16.1*  HCT 51.3* 49.3*  MCV 91.4 91.0  PLT 149* 135*   Cardiac Enzymes:  Recent Labs Lab 10/13/12 1915 10/13/12 2114 10/14/12 0315  TROPONINI 0.87* 0.66* 0.49*   BNP (last 3 results)  Recent Labs  10/13/12 1837  PROBNP 5030.0*   CBG:  Recent Labs Lab 10/14/12 0227  GLUCAP 185*    Recent Results (from the past 240 hour(s))  MRSA PCR SCREENING      Status: None   Collection Time    10/13/12 11:16 PM      Result Value Range Status   MRSA by PCR NEGATIVE  NEGATIVE Final   Comment:            The GeneXpert MRSA Assay (FDA     approved for NASAL specimens     only), is one component of a     comprehensive MRSA colonization     surveillance program. It is not     intended to diagnose MRSA     infection nor to guide or     monitor treatment for     MRSA infections.     Studies: Dg Chest Portable 1 View  10/13/2012  *RADIOLOGY REPORT*  Clinical Data: Shortness of breath.  PORTABLE CHEST - 1 VIEW  Comparison: 09/22/2012  Findings: There is cardiomegaly with vascular congestion and possible mild interstitial edema.  Bibasilar opacities and small effusions.  No acute bony abnormality.  IMPRESSION: Suspect mild interstitial edema/CHF.  Small bilateral effusions with bibasilar opacities, most likely atelectasis.   Original Report Authenticated By: Charlett Nose, M.D.      Additional labs:   Scheduled Meds: . aspirin EC  81 mg Oral Daily  . brimonidine  1 drop Both Eyes BID  . docusate sodium  50 mg Oral BID  . ezetimibe  10 mg Oral QHS  . furosemide  40 mg Intravenous Q8H  . glipiZIDE  10 mg Oral BID AC  . heparin  5,000 Units Subcutaneous Q8H  . losartan  100 mg Oral Daily   And  . hydrochlorothiazide  25 mg Oral Daily  . levothyroxine  150 mcg Oral QAC breakfast  . linagliptin  5 mg Oral Daily  . loratadine  10 mg Oral Daily  . NIFEdipine  30 mg Oral Daily  . omega-3 acid ethyl esters  1 g Oral BID  . pantoprazole  40 mg Oral Daily  . simvastatin  10 mg Oral q1800  . sodium chloride  3 mL Intravenous Q12H  . Travoprost (BAK Free)  1 drop Both Eyes BID   Continuous Infusions:   Principal Problem:   Acute congestive heart failure Active Problems:   Chronic respiratory failure with hypoxia   DM2 (diabetes mellitus, type 2)   Thyroid disease   Elevated troponin    Time spent: 40 minutes    [facility]  [facility] Pager [phone].   If 8PM-8AM, please contact night-coverage at [email or web address], password [facility] 10/14/2012, 7:30 AM  LOS: 1 day

## 2012-10-14 NOTE — Consult Note (Signed)
Reason for Consult: SOB  Requesting Physician: Claybon Jabs  HPI: This is a 78 y.o. female with a past medical history significant for morbid obesity, OSA, pulmonary HTN, DM, and normal coronaries in 2011. She has been followed by Dr. Craige Cotta requiring now 3 liters of oxygen continuously, up from 2 liters of oxygen. Recently, about 2 weeks ago, she said she had a bad cold including coughing, worsening shortness of breath, lower extremity swelling and increasing abdominal girth. She apparently saw Dr Elizbeth Squires in the office for this and underwent a chest x-ray. That demonstrated mild cardiac enlargement with central vascular congestion but no effusion signifying possibly early heart failure versus pulmonary hypertension. She saw Dr Rennis Golden on 10/01/12 and was started on Lasix 20mg  BID. She presented to the ER at [facility] last pm with complaints of SOB. We are asked to see her in consult.   PMHx:  Past Medical History  Diagnosis Date  . Diabetes mellitus   . Hypertension   . Shingles   . GERD (gastroesophageal reflux disease)   . Abnormal heart rhythms   . COPD (chronic obstructive pulmonary disease)   . Thyroid disease     hypthyroidism   Past Surgical History  Procedure Laterality Date  . Tubal ligation    . Ovary removed    . Knee arthroscopy      left    FAMHx: Family History  Problem Relation Age of Onset  . Heart attack Father 71  . Heart disease Mother 69    SOCHx:  reports that she has never smoked. Her smokeless tobacco use includes Snuff. She reports that she does not drink alcohol or use illicit drugs.  ALLERGIES: Allergies  Allergen Reactions  . Diphenhydramine Hcl Other (See Comments)    Glaucoma prevents patient from taking this medication   . Aloe Vera Rash  . Penicillins Swelling and Rash    ROS: Pertinent items are noted in HPI.  HOME MEDICATIONS: Prescriptions prior to admission  Medication Sig Dispense Refill  . ALPRAZolam (XANAX) 0.5 MG tablet Take 0.5 mg by mouth  4 (four) times daily as needed for anxiety.       . Brimonidine Tartrate (ALPHAGAN P OP) Place 1 drop into both eyes 2 (two) times daily.       Marland Kitchen docusate sodium (COLACE) 50 MG capsule Take 50 mg by mouth 2 (two) times daily.      Marland Kitchen ezetimibe (ZETIA) 10 MG tablet Take 10 mg by mouth at bedtime.        . fexofenadine (ALLERGY RELIEF) 180 MG tablet Take 180 mg by mouth daily.      . fish oil-omega-3 fatty acids 1000 MG capsule Take 1 g by mouth 2 (two) times daily.        Marland Kitchen glipiZIDE (GLUCOTROL) 10 MG tablet Take 10 mg by mouth 2 (two) times daily before a meal.        . lansoprazole (PREVACID) 30 MG capsule Take 30 mg by mouth daily.        Marland Kitchen levothyroxine (SYNTHROID, LEVOTHROID) 150 MCG tablet Take 150 mcg by mouth daily.      Marland Kitchen linagliptin (TRADJENTA) 5 MG TABS tablet Take 5 mg by mouth daily.      Marland Kitchen losartan-hydrochlorothiazide (HYZAAR) 100-25 MG per tablet Take 1 tablet by mouth daily.        Marland Kitchen NIFEdipine (PROCARDIA XL/ADALAT-CC) 30 MG 24 hr tablet Take 30 mg by mouth daily.        Marland Kitchen oxyCODONE-acetaminophen (PERCOCET/ROXICET)  5-325 MG per tablet Take 1 tablet by mouth every 4 (four) hours as needed for pain.      . pravastatin (PRAVACHOL) 20 MG tablet Take 20 mg by mouth daily.      . Travoprost, BAK Free, (TRAVATAMN) 0.004 % SOLN ophthalmic solution Place 1 drop into both eyes 2 (two) times daily.          HOSPITAL MEDICATIONS: I have reviewed the patient's current medications.  VITALS: Blood pressure 113/67, pulse 100, temperature 98.1 F (36.7 C), temperature source Oral, resp. rate 18, height 5\' 1"  (1.549 m), weight 112.7 kg (248 lb 7.3 oz), SpO2 92.00%.  PHYSICAL EXAM: General appearance: alert, cooperative, no distress and morbidly obese Neck: no carotid bruit and no JVD Lungs: clear to auscultation bilaterally Heart: regular rate and rhythm Abdomen: morbid obesity Extremities: 2+ edema Pulses: 2+ and symmetric Skin: Skin color, texture, turgor normal. No rashes or  lesions Neurologic: Grossly normal  LABS: Results for orders placed during the hospital encounter of 10/13/12 (from the past 48 hour(s))  PRO B NATRIURETIC PEPTIDE     Status: Abnormal   Collection Time    10/13/12  6:37 PM      Result Value Range   Pro B Natriuretic peptide (BNP) 5030.0 (*) 0 - 450 pg/mL  CBC     Status: Abnormal   Collection Time    10/13/12  6:37 PM      Result Value Range   WBC 12.5 (*) 4.0 - 10.5 K/uL   RBC 5.61 (*) 3.87 - 5.11 MIL/uL   Hemoglobin 16.7 (*) 12.0 - 15.0 g/dL   HCT 19.1 (*) 47.8 - 29.5 %   MCV 91.4  78.0 - 100.0 fL   MCH 29.8  26.0 - 34.0 pg   MCHC 32.6  30.0 - 36.0 g/dL   RDW 62.1  30.8 - 65.7 %   Platelets 149 (*) 150 - 400 K/uL  COMPREHENSIVE METABOLIC PANEL     Status: Abnormal   Collection Time    10/13/12  6:37 PM      Result Value Range   Sodium 137  135 - 145 mEq/L   Potassium 4.3  3.5 - 5.1 mEq/L   Chloride 97  96 - 112 mEq/L   CO2 29  19 - 32 mEq/L   Glucose, Bld 188 (*) 70 - 99 mg/dL   BUN 47 (*) 6 - 23 mg/dL   Creatinine, Ser 8.46 (*) 0.50 - 1.10 mg/dL   Calcium 9.7  8.4 - 96.2 mg/dL   Total Protein 6.8  6.0 - 8.3 g/dL   Albumin 4.0  3.5 - 5.2 g/dL   AST 28  0 - 37 U/L   ALT 19  0 - 35 U/L   Alkaline Phosphatase 84  39 - 117 U/L   Total Bilirubin 0.9  0.3 - 1.2 mg/dL   GFR calc non Af Amer 47 (*) >90 mL/min   GFR calc Af Amer 55 (*) >90 mL/min   Comment:            The eGFR has been calculated     using the CKD EPI equation.     This calculation has not been     validated in all clinical     situations.     eGFR's persistently     <90 mL/min signify     possible Chronic Kidney Disease.  POCT I-STAT TROPONIN I     Status: Abnormal   Collection Time  10/13/12  7:01 PM      Result Value Range   Troponin i, poc 0.62 (*) 0.00 - 0.08 ng/mL   Comment NOTIFIED PHYSICIAN     Comment 3            Comment: Due to the release kinetics of cTnI,     a negative result within the first hours     of the onset of symptoms  does not rule out     myocardial infarction with certainty.     If myocardial infarction is still suspected,     repeat the test at appropriate intervals.  TROPONIN I     Status: Abnormal   Collection Time    10/13/12  7:15 PM      Result Value Range   Troponin I 0.87 (*) <0.30 ng/mL   Comment:            Due to the release kinetics of cTnI,     a negative result within the first hours     of the onset of symptoms does not rule out     myocardial infarction with certainty.     If myocardial infarction is still suspected,     repeat the test at appropriate intervals.     CRITICAL RESULT CALLED TO, READ BACK BY AND VERIFIED WITH:     E GENTILE,RN 1955 10/13/12 WBOND  URINALYSIS, ROUTINE W REFLEX MICROSCOPIC     Status: Abnormal   Collection Time    10/13/12  8:13 PM      Result Value Range   Color, Urine YELLOW  YELLOW   APPearance CLOUDY (*) CLEAR   Specific Gravity, Urine 1.017  1.005 - 1.030   pH 5.0  5.0 - 8.0   Glucose, UA NEGATIVE  NEGATIVE mg/dL   Hgb urine dipstick NEGATIVE  NEGATIVE   Bilirubin Urine NEGATIVE  NEGATIVE   Ketones, ur NEGATIVE  NEGATIVE mg/dL   Protein, ur NEGATIVE  NEGATIVE mg/dL   Urobilinogen, UA 1.0  0.0 - 1.0 mg/dL   Nitrite NEGATIVE  NEGATIVE   Leukocytes, UA SMALL (*) NEGATIVE  URINE MICROSCOPIC-ADD ON     Status: None   Collection Time    10/13/12  8:13 PM      Result Value Range   Squamous Epithelial / LPF RARE  RARE   WBC, UA 0-2  <3 WBC/hpf   RBC / HPF 0-2  <3 RBC/hpf   Bacteria, UA RARE  RARE  TROPONIN I     Status: Abnormal   Collection Time    10/13/12  9:14 PM      Result Value Range   Troponin I 0.66 (*) <0.30 ng/mL   Comment:            Due to the release kinetics of cTnI,     a negative result within the first hours     of the onset of symptoms does not rule out     myocardial infarction with certainty.     If myocardial infarction is still suspected,     repeat the test at appropriate intervals.     CRITICAL VALUE NOTED.   VALUE IS CONSISTENT WITH PREVIOUSLY REPORTED AND CALLED VALUE.  CBC     Status: Abnormal   Collection Time    10/13/12  9:14 PM      Result Value Range   WBC 11.7 (*) 4.0 - 10.5 K/uL   RBC 5.42 (*) 3.87 - 5.11 MIL/uL   Hemoglobin 16.1 (*)  12.0 - 15.0 g/dL   HCT 16.1 (*) 09.6 - 04.5 %   MCV 91.0  78.0 - 100.0 fL   MCH 29.7  26.0 - 34.0 pg   MCHC 32.7  30.0 - 36.0 g/dL   RDW 40.9  81.1 - 91.4 %   Platelets 135 (*) 150 - 400 K/uL  CREATININE, SERUM     Status: Abnormal   Collection Time    10/13/12  9:14 PM      Result Value Range   Creatinine, Ser 1.10  0.50 - 1.10 mg/dL   GFR calc non Af Amer 48 (*) >90 mL/min   GFR calc Af Amer 55 (*) >90 mL/min   Comment:            The eGFR has been calculated     using the CKD EPI equation.     This calculation has not been     validated in all clinical     situations.     eGFR's persistently     <90 mL/min signify     possible Chronic Kidney Disease.  MRSA PCR SCREENING     Status: None   Collection Time    10/13/12 11:16 PM      Result Value Range   MRSA by PCR NEGATIVE  NEGATIVE   Comment:            The GeneXpert MRSA Assay (FDA     approved for NASAL specimens     only), is one component of a     comprehensive MRSA colonization     surveillance program. It is not     intended to diagnose MRSA     infection nor to guide or     monitor treatment for     MRSA infections.  GLUCOSE, CAPILLARY     Status: Abnormal   Collection Time    10/14/12  2:27 AM      Result Value Range   Glucose-Capillary 185 (*) 70 - 99 mg/dL  BASIC METABOLIC PANEL     Status: Abnormal   Collection Time    10/14/12  3:15 AM      Result Value Range   Sodium 137  135 - 145 mEq/L   Potassium 4.1  3.5 - 5.1 mEq/L   Chloride 97  96 - 112 mEq/L   CO2 32  19 - 32 mEq/L   Glucose, Bld 188 (*) 70 - 99 mg/dL   BUN 47 (*) 6 - 23 mg/dL   Creatinine, Ser 7.82 (*) 0.50 - 1.10 mg/dL   Calcium 9.6  8.4 - 95.6 mg/dL   GFR calc non Af Amer 43 (*) >90 mL/min    GFR calc Af Amer 50 (*) >90 mL/min   Comment:            The eGFR has been calculated     using the CKD EPI equation.     This calculation has not been     validated in all clinical     situations.     eGFR's persistently     <90 mL/min signify     possible Chronic Kidney Disease.  TROPONIN I     Status: Abnormal   Collection Time    10/14/12  3:15 AM      Result Value Range   Troponin I 0.49 (*) <0.30 ng/mL   Comment:            Due to the release kinetics of cTnI,  a negative result within the first hours     of the onset of symptoms does not rule out     myocardial infarction with certainty.     If myocardial infarction is still suspected,     repeat the test at appropriate intervals.     CRITICAL VALUE NOTED.  VALUE IS CONSISTENT WITH PREVIOUSLY REPORTED AND CALLED VALUE.  TSH     Status: Abnormal   Collection Time    10/14/12  3:15 AM      Result Value Range   TSH 0.120 (*) 0.350 - 4.500 uIU/mL  GLUCOSE, CAPILLARY     Status: Abnormal   Collection Time    10/14/12  7:53 AM      Result Value Range   Glucose-Capillary 165 (*) 70 - 99 mg/dL   Comment 1 Notify RN    TROPONIN I     Status: Abnormal   Collection Time    10/14/12  8:46 AM      Result Value Range   Troponin I 0.40 (*) <0.30 ng/mL   Comment:            Due to the release kinetics of cTnI,     a negative result within the first hours     of the onset of symptoms does not rule out     myocardial infarction with certainty.     If myocardial infarction is still suspected,     repeat the test at appropriate intervals.     CRITICAL VALUE NOTED.  VALUE IS CONSISTENT WITH PREVIOUSLY REPORTED AND CALLED VALUE.  GLUCOSE, CAPILLARY     Status: Abnormal   Collection Time    10/14/12 12:07 PM      Result Value Range   Glucose-Capillary 217 (*) 70 - 99 mg/dL   Comment 1 Notify RN      EKG- NSR, incomplete RBBB  IMAGING: Dg Chest Portable 1 View  10/13/2012  *RADIOLOGY REPORT*  Clinical Data: Shortness of  breath.  PORTABLE CHEST - 1 VIEW  Comparison: 09/22/2012  Findings: There is cardiomegaly with vascular congestion and possible mild interstitial edema.  Bibasilar opacities and small effusions.  No acute bony abnormality.  IMPRESSION: Suspect mild interstitial edema/CHF.  Small bilateral effusions with bibasilar opacities, most likely atelectasis.   Original Report Authenticated By: Charlett Nose, M.D.     IMPRESSION: Principal Problem:   Diastolic CHF, acute on chronic Active Problems:   Chronic respiratory failure with hypoxia   DM2 (diabetes mellitus, type 2)   Elevated troponin   Unspecified hypothyroidism   Normal coronary arteries at cath 2011   RECOMMENDATION: MD to see. Echo pending. She has diuresed since admission.   Time Spent Directly with Patient: 45 minutes  Caliya Narine K 10/14/2012, 3:09 PM

## 2012-10-14 NOTE — Consult Note (Signed)
[name] MRN: [id number] Admit Date: 10/13/2012    I have seen and evaluated the patient this PM along with [name], PA. I agree with his findings, examination as well as impression recommendations.  78 y/o morbidly obese woman with known Pulmonary HTN, OSA (obesity hypoventilation syndrome) who presented with worsening SOB & LE edema / wgt gain. Also has h/o angiographically normal coronary arteries in 2011, making troponin elevation difficult to attribute to ACS.  Her ECG does have anterior & inferior TWI (unfortunately, we do not have an old ECG for comparison).  Will follow troponin trend along with symptoms.  She has diuresed well o/n & seems to have improved symptomatically & denies any chest tightness or pressure (with the exception of an episode ~1 week ago).    At this point, I agree with echocardiographic assessment of EF & wall motion -- normal EF / WM would make ACS less likely.    Agree with IV diuresis -- likely decreased PO absorbance.  With tachycardia, will convert from Procardia to Diltiazem.  [facility] will take over as Primary Team, appreciate TRH assistance with the admission & initial Rx.   Marykay Lex, M.D., M.S. [facility] [address] [place], [place]  [address]  [phone] Pager # [phone] 10/14/2012 4:09 PM

## 2012-10-14 NOTE — Progress Notes (Signed)
Utilization Review Completed. 10/14/2012  

## 2012-10-14 NOTE — Progress Notes (Signed)
Nutrition Brief Note  Patient identified on the Malnutrition Screening Tool (MST) Report  Body mass index is 46.97 kg/(m^2). Patient meets criteria for morbid obesity based on current BMI.   Current diet order is Heart Healthy, patient is consuming approximately 70% of meals at this time. Labs and medications reviewed.   MST incorrectly scored.  Pt denies wt loss- endorses wt gain, likely related to fluid status.  Pt states her appetite and intake have improved since admission and she was able to eat well at breakfast this am.  No nutrition interventions warranted at this time. If nutrition issues arise, please consult RD.   Loyce Dys, MS RD LDN Clinical Inpatient Dietitian Pager: [phone] Weekend/After hours pager: [phone]

## 2012-10-15 LAB — BASIC METABOLIC PANEL
BUN: 38 mg/dL — ABNORMAL HIGH (ref 6–23)
CO2: 36 mEq/L — ABNORMAL HIGH (ref 19–32)
Calcium: 9.6 mg/dL (ref 8.4–10.5)
Creatinine, Ser: 1.07 mg/dL (ref 0.50–1.10)
GFR calc non Af Amer: 49 mL/min — ABNORMAL LOW (ref >90)
Glucose, Bld: 149 mg/dL — ABNORMAL HIGH (ref 70–99)

## 2012-10-15 LAB — CBC
MCH: 29.6 pg (ref 26.0–34.0)
MCHC: 33 g/dL (ref 30.0–36.0)
MCV: 89.4 fL (ref 78.0–100.0)
Platelets: 113 10*3/uL — ABNORMAL LOW (ref 150–400)
RBC: 5.21 MIL/uL — ABNORMAL HIGH (ref 3.87–5.11)

## 2012-10-15 LAB — GLUCOSE, CAPILLARY
Glucose-Capillary: 145 mg/dL — ABNORMAL HIGH (ref 70–99)
Glucose-Capillary: 203 mg/dL — ABNORMAL HIGH (ref 70–99)
Glucose-Capillary: 147 mg/dL — ABNORMAL HIGH (ref 70–99)
Glucose-Capillary: 180 mg/dL — ABNORMAL HIGH (ref 70–99)

## 2012-10-15 LAB — D-DIMER, QUANTITATIVE: D-Dimer, Quant: 1.16 ug/mL-FEU — ABNORMAL HIGH (ref 0.00–0.48)

## 2012-10-15 MED ORDER — DILTIAZEM HCL ER 90 MG PO CP12
90.0000 mg | ORAL_CAPSULE | Freq: Two times a day (BID) | ORAL | Status: AC
  Administered 2012-10-15 – 2012-10-19 (×10): 90 mg via ORAL
  Filled 2012-10-15 (×11): qty 1

## 2012-10-15 MED ORDER — FUROSEMIDE 10 MG/ML IJ SOLN
80.0000 mg | Freq: Two times a day (BID) | INTRAMUSCULAR | Status: DC
  Administered 2012-10-15 – 2012-10-19 (×8): 80 mg via INTRAVENOUS
  Filled 2012-10-15 (×10): qty 8

## 2012-10-15 MED ORDER — POTASSIUM CHLORIDE CRYS ER 20 MEQ PO TBCR
20.0000 meq | EXTENDED_RELEASE_TABLET | Freq: Two times a day (BID) | ORAL | Status: DC
  Administered 2012-10-15 – 2012-10-20 (×11): 20 meq via ORAL
  Filled 2012-10-15 (×12): qty 1

## 2012-10-15 NOTE — Progress Notes (Signed)
Inpatient Diabetes Program Recommendations  AACE/ADA: New Consensus Statement on Inpatient Glycemic Control (2013)  Target Ranges:  Prepandial:   less than 140 mg/dL      Peak postprandial:   less than 180 mg/dL (1-2 hours)      Critically ill patients:  140 - 180 mg/dL   Reason for Visit: Results for [name], [name] (MRN [id number]) as of 10/15/2012 13:14  Ref. Range 10/14/2012 15:41 10/14/2012 21:39 10/15/2012 08:23 10/15/2012 11:48  Glucose-Capillary Latest Range: 70-99 mg/dL 409 (H) 811 (H) 914 (H) 203 (H)  Results for [name], [name] (MRN [id number]) as of 10/15/2012 13:14  Ref. Range 10/14/2012 15:39  Hemoglobin A1C Latest Range: <5.7 % 8.6 (H)     Note elevated CBG's and A1C.  Please consider adding Lantus 10 units daily.  Will follow.

## 2012-10-15 NOTE — Progress Notes (Signed)
Subjective:  SOB is better. Dry home wgt "227"  Objective:  Vital Signs in the last 24 hours: Temp:  [97.6 F (36.4 C)-98.2 F (36.8 C)] 98 F (36.7 C) (02/20 0826) Pulse Rate:  [100-109] 104 (02/20 0826) Resp:  [15-21] 15 (02/20 0826) BP: (99-130)/(63-82) 113/73 mmHg (02/20 0826) SpO2:  [88 %-92 %] 91 % (02/20 0826) Weight:  [110 kg (242 lb 8.1 oz)] 110 kg (242 lb 8.1 oz) (02/20 0500)  Intake/Output from previous day:  Intake/Output Summary (Last 24 hours) at 10/15/12 0948 Last data filed at 10/15/12 0754  Gross per 24 hour  Intake    700 ml  Output   4500 ml  Net  -3800 ml    Physical Exam: General appearance: alert, cooperative and no distress Lungs: clear to auscultation bilaterally Heart: regular rate and rhythm   Rate: 104  Rhythm: sinus tachycardia, PVCs  Lab Results:  Recent Labs  10/13/12 2114 10/15/12 0611  WBC 11.7* 7.6  HGB 16.1* 15.4*  PLT 135* 113*    Recent Labs  10/14/12 0315 10/15/12 0611  NA 137 139  K 4.1 3.6  CL 97 95*  CO2 32 36*  GLUCOSE 188* 149*  BUN 47* 38*  CREATININE 1.20* 1.07    Recent Labs  10/14/12 0315 10/14/12 0846  TROPONINI 0.49* 0.40*   Hepatic Function Panel  Recent Labs  10/13/12 1837  PROT 6.8  ALBUMIN 4.0  AST 28  ALT 19  ALKPHOS 84  BILITOT 0.9   No results found for this basename: CHOL,  in the last 72 hours No results found for this basename: INR,  in the last 72 hours  Imaging: Imaging results have been reviewed  Cardiac Studies:  Assessment/Plan:   Principal Problem:   Diastolic CHF, acute on chronic Active Problems:   Chronic respiratory failure with hypoxia   DM2 (diabetes mellitus, type 2)   Elevated troponin   Unspecified hypothyroidism   Normal coronary arteries at cath 2011   Plan- Continue to diurese, will resume Lasix at increased dose of 80mg  BID. I/O negative 6L since adm (wgt on  admission probably not accurate) but she is still probably 20lbs over her dry wgt.  Echo  shows marked RV,RA enlargement which is new from 2011 echo. She is tachycardic and sedentary. Will check D-dimer and if its significantly elevated she probably needs a CTA of her chest which apparently has not been done in the past.    [name] PA-C 10/15/2012, 9:48 AM

## 2012-10-15 NOTE — Progress Notes (Signed)
Discussed with [facility] Cardiology service- Dr. [name]. They have assumed all care of the patient and do not require TRH assistance at this time. TRH will sign off. Please call us back for any further assistance.  Informed patient of same.  HONGALGI,ANAND 9:33 AM 10/15/2012

## 2012-10-15 NOTE — Progress Notes (Signed)
Patient's oxygen saturations range 85-91% on 5 liters via nasal  Cannula. Patient refused to wear any type of oxygen mask. Patient alert and oriented times 4. Nurse discussed benefits of wearing face mask instead of nasal cannula. Patient continues to refuse mask. Will continue to monitor. Will notify MD if sats continue less than 92%.

## 2012-10-15 NOTE — Progress Notes (Signed)
I have seen and evaluated the patient this AM along with [name], PA. I agree with his findings, examination as well as impression recommendations.  Diuresing well, but still has significant edema.  Dyspnea has improved.  Echo suggests Cor Pulmonale with massive RV/RA dilation & severe PH.  No evidence of elevated LV filling pressures -- RHF & not diastolic LV Failure.  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   Normal coronary arteries at cath 2011   Unspecified hypothyroidism  Agree with continued Diuresis.   With RV dilation - checking D-dimer (? Possible chronic PE as etiology vs obesity hypoventilation syndrome).  If abnormal would have low threshold for VQ scan vs. PE protocol CT Scan. Convert from Procardia to Diltiazem -- to drop HR some. Place SCDs to assist with demarginalization of 3rd spaced volume.  BP stable. On Statin & zetia for HLD. PO meds with SSI for DM. Synthroid.  PPI, Sub Q heparin & now SCDs for DVT prophylaxis. PT/OT to assess - ? Home health needs beyond home O2.  Marykay Lex, M.D., M.S. [facility] [address] [place], [place]  [address]  [phone] Pager # [phone] 10/15/2012 10:14 AM

## 2012-10-16 ENCOUNTER — Inpatient Hospital Stay (HOSPITAL_COMMUNITY): Payer: Medicare Other

## 2012-10-16 LAB — BASIC METABOLIC PANEL
CO2: 37 mEq/L — ABNORMAL HIGH (ref 19–32)
Calcium: 9.7 mg/dL (ref 8.4–10.5)
Chloride: 95 mEq/L — ABNORMAL LOW (ref 96–112)
Creatinine, Ser: 1.03 mg/dL (ref 0.50–1.10)
GFR calc Af Amer: 60 mL/min — ABNORMAL LOW (ref >90)
Sodium: 140 mEq/L (ref 135–145)

## 2012-10-16 LAB — GLUCOSE, CAPILLARY
Glucose-Capillary: 185 mg/dL — ABNORMAL HIGH (ref 70–99)
Glucose-Capillary: 262 mg/dL — ABNORMAL HIGH (ref 70–99)

## 2012-10-16 MED ORDER — TECHNETIUM TO 99M ALBUMIN AGGREGATED
5.0000 | Freq: Once | INTRAVENOUS | Status: AC | PRN
  Administered 2012-10-16: 5 via INTRAVENOUS

## 2012-10-16 NOTE — Plan of Care (Signed)
Problem: Phase I Progression Outcomes Goal: Dyspnea controlled at rest (HF) Dyspnea uncontrolled at this time. Noted desaturation with any slight movement esp when up to use BSC.

## 2012-10-16 NOTE — Progress Notes (Signed)
[facility]  Subjective: No complaints. Denies CP, palpitations, lightheadedness/dizziness. SOB has improved. Still using supplemental O2.   Objective: Vital signs in last 24 hours: Temp:  [97.4 F (36.3 C)-98.7 F (37.1 C)] 97.4 F (36.3 C) (02/21 0734) Pulse Rate:  [78-105] 84 (02/21 0734) Resp:  [12-22] 13 (02/21 0734) BP: (84-118)/(55-72) 111/64 mmHg (02/21 0734) SpO2:  [86 %-92 %] 89 % (02/21 0734) Weight:  [244 lb 14.9 oz (111.1 kg)] 244 lb 14.9 oz (111.1 kg) (02/21 0333) Last BM Date: 10/15/12  Intake/Output from previous day: 02/20 0701 - 02/21 0700 In: 720 [P.O.:720] Out: 2400 [Urine:2400] Intake/Output this shift: Total I/O In: 360 [P.O.:360] Out: -   Medications Current Facility-Administered Medications  Medication Dose Route Frequency Provider Last Rate Last Dose  . 0.9 %  sodium chloride infusion  250 mL Intravenous PRN Hillary Bow, DO      . acetaminophen (TYLENOL) tablet 650 mg  650 mg Oral Q4H PRN Hillary Bow, DO      . ALPRAZolam Prudy Feeler) tablet 0.5 mg  0.5 mg Oral QID PRN Hillary Bow, DO   0.5 mg at 10/15/12 2109  . alum & mag hydroxide-simeth (MAALOX/MYLANTA) 200-200-20 MG/5ML suspension 15 mL  15 mL Oral Q6H PRN Leda Gauze, NP   15 mL at 10/14/12 2237  . aspirin EC tablet 81 mg  81 mg Oral Daily Hillary Bow, DO   81 mg at 10/15/12 0944  . brimonidine (ALPHAGAN) 0.2 % ophthalmic solution 1 drop  1 drop Both Eyes BID Hillary Bow, DO   1 drop at 10/15/12 2112  . diltiazem (CARDIZEM SR) 12 hr capsule 90 mg  90 mg Oral Q12H Marykay Lex, MD   90 mg at 10/15/12 2110  . docusate sodium (COLACE) capsule 50 mg  50 mg Oral BID Hillary Bow, DO   50 mg at 10/15/12 2110  . ezetimibe (ZETIA) tablet 10 mg  10 mg Oral QHS Hillary Bow, DO   10 mg at 10/15/12 2110  . furosemide (LASIX) injection 80 mg  80 mg Intravenous BID Abelino Derrick, PA   80 mg at 10/16/12 1610  . glipiZIDE (GLUCOTROL) tablet  10 mg  10 mg Oral BID AC Hillary Bow, DO   10 mg at 10/16/12 9604  . heparin injection 5,000 Units  5,000 Units Subcutaneous Q8H Hillary Bow, DO   5,000 Units at 10/16/12 [phone]  . insulin aspart (novoLOG) injection 0-9 Units  0-9 Units Subcutaneous TID WC Elease Etienne, MD   1 Units at 10/16/12 0809  . levothyroxine (SYNTHROID, LEVOTHROID) tablet 150 mcg  150 mcg Oral QAC breakfast Hillary Bow, DO   150 mcg at 10/16/12 8119  . linagliptin (TRADJENTA) tablet 5 mg  5 mg Oral Daily Hillary Bow, DO   5 mg at 10/15/12 0947  . loratadine (CLARITIN) tablet 10 mg  10 mg Oral Daily Hillary Bow, DO   10 mg at 10/15/12 0946  . losartan (COZAAR) tablet 100 mg  100 mg Oral Daily Hillary Bow, DO   100 mg at 10/15/12 0946  . omega-3 acid ethyl esters (LOVAZA) capsule 1 g  1 g Oral BID Hillary Bow, DO   1 g at 10/15/12 2110  . ondansetron (ZOFRAN) injection 4 mg  4 mg Intravenous Q6H PRN Hillary Bow, DO      . oxyCODONE-acetaminophen (PERCOCET/ROXICET) 5-325 MG per tablet 1 tablet  1 tablet Oral Q4H PRN Hillary Bow, DO      . pantoprazole (PROTONIX) EC tablet 40 mg  40 mg Oral Daily Hillary Bow, DO   40 mg at 10/15/12 0949  . potassium chloride SA (K-DUR,KLOR-CON) CR tablet 20 mEq  20 mEq Oral BID Eda Paschal [place], [place]   20 mEq at 10/15/12 2111  . simvastatin (ZOCOR) tablet 10 mg  10 mg Oral q1800 Hillary Bow, DO   10 mg at 10/15/12 1718  . sodium chloride 0.9 % injection 3 mL  3 mL Intravenous Q12H Hillary Bow, DO   3 mL at 10/15/12 2112  . sodium chloride 0.9 % injection 3 mL  3 mL Intravenous PRN Hillary Bow, DO   3 mL at 10/14/12 2145  . Travoprost (BAK Free) (TRAVATAN) 0.004 % ophthalmic solution SOLN 1 drop  1 drop Both Eyes BID Hillary Bow, DO   1 drop at 10/15/12 2112    PE: General appearance: alert, cooperative, no distress and moderately obese Lungs: decreased breath sounds bilaterally Heart: regular rhythm, slightly fast  rate Extremities: 1+ pitting edema bilaterally Pulses: 2+ radials, 1+ DPs Skin: warm and dry Neurologic: Grossly normal  Lab Results:   Recent Labs  10/13/12 1837 10/13/12 2114 10/15/12 0611  WBC 12.5* 11.7* 7.6  HGB 16.7* 16.1* 15.4*  HCT 51.3* 49.3* 46.6*  PLT 149* 135* 113*   BMET  Recent Labs  10/14/12 0315 10/15/12 0611 10/16/12 0450  NA 137 139 140  K 4.1 3.6 3.6  CL 97 95* 95*  CO2 32 36* 37*  GLUCOSE 188* 149* 147*  BUN 47* 38* 32*  CREATININE 1.20* 1.07 1.03  CALCIUM 9.6 9.6 9.7   BNP (last 3 results)  Recent Labs  10/13/12 1837  PROBNP 5030.0*   PT/INR No results found for this basename: LABPROT, INR,  in the last 72 hours Cholesterol No results found for this basename: CHOL,  in the last 72 hours Cardiac Enzymes No components found with this basename: TROPONIN,  CKMB,   Studies/Results: 2D Echo 2/19 Study Conclusions  - Left ventricle: The cavity size was normal. Wall thickness was increased in a pattern of moderate LVH. There was moderate concentric hypertrophy. Systolic function was normal. The estimated ejection fraction was in the range of 55% to 65%. Although no diagnostic regional wall motion abnormality was identified, this possibility cannot be completely excluded on the basis of this study. Doppler parameters are consistent with abnormal left ventricular relaxation (grade 1 diastolic dysfunction). - Ventricular septum: These changes are consistent with RV volume and pressure overload. - Aortic valve: Mildly calcified annulus. Trileaflet; normal thickness, mildly calcified leaflets. - Mitral valve: Calcified annulus. Mildly thickened leaflets . - Right ventricle: The cavity size was severely dilated. Wall thickness was normal. Systolic function was mildly reduced. The estimated ejection fraction was , by visual assessment. - Right atrium: The atrium was moderately to severely dilated. - Pulmonary arteries: PA peak pressure:  78mm Hg (S). Impressions:  - Consistent with cor pulmonale, with elevated right-sided filling pressure and normal left-sided filling pressure. Pulmonary hypertension, with mild RV failure, due to primary pulmonary hypertension.   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   Unspecified hypothyroidism  Normal coronary arteries at cath 2011   Pulmonary hypertension, moderate to severe; Severe by Echo 09/2012  Plan: Pt was admitted on 2/19 for A/C Diastolic CHF due to pulmonary hypertension. Echo on 2/19 revealed a moderately to severely dilated RA and a severely dilatated RV. Pt has also has had some tachycardia this hospitalization. HR is now in the mid 80s on PO diltiazem. She had a positive D-dimer yesterday of 1.16. Pt may need a Chest CT to rule out PE as cause of right HF. Will notify MD of D-dimer results. Her SOB has slightly improved. She is still using supplemental O2. O2 sats are 90%. MD to follow with further recommendation.    LOS: 3 days    Brittainy M. Delmer Islam 10/16/2012 9:41 AM   Patient seen and examined. Agree with assessment and plan. Pt is breathing better. No chest pain. With dilated RV and RA and elevated d-dimer, will assess for PE.  BNP 5030; continue diuresis.   Lennette Bihari, MD, [facility] 10/16/2012 11:21 AM

## 2012-10-17 LAB — GLUCOSE, CAPILLARY
Glucose-Capillary: 141 mg/dL — ABNORMAL HIGH (ref 70–99)
Glucose-Capillary: 288 mg/dL — ABNORMAL HIGH (ref 70–99)
Glucose-Capillary: 210 mg/dL — ABNORMAL HIGH (ref 70–99)

## 2012-10-17 NOTE — Progress Notes (Signed)
[facility] Progress Note  Subjective:  Breathing better.  Objective:   Vital Signs in the last 24 hours: Temp:  [97.4 F (36.3 C)-98.4 F (36.9 C)] 97.8 F (36.6 C) (02/22 1110) Pulse Rate:  [80-97] 97 (02/22 1110) Resp:  [15-21] 20 (02/22 1110) BP: (93-127)/(45-70) 109/51 mmHg (02/22 1110) SpO2:  [89 %-92 %] 91 % (02/22 1110) Weight:  [106 kg (233 lb 11 oz)] 106 kg (233 lb 11 oz) (02/22 0500)  Intake/Output from previous day: 02/21 0701 - 02/22 0700 In: 480 [P.O.:480] Out: 1800 [Urine:1800]  Scheduled: . aspirin EC  81 mg Oral Daily  . brimonidine  1 drop Both Eyes BID  . diltiazem  90 mg Oral Q12H  . docusate sodium  50 mg Oral BID  . ezetimibe  10 mg Oral QHS  . furosemide  80 mg Intravenous BID  . glipiZIDE  10 mg Oral BID AC  . heparin  5,000 Units Subcutaneous Q8H  . insulin aspart  0-9 Units Subcutaneous TID WC  . levothyroxine  150 mcg Oral QAC breakfast  . linagliptin  5 mg Oral Daily  . loratadine  10 mg Oral Daily  . losartan  100 mg Oral Daily  . omega-3 acid ethyl esters  1 g Oral BID  . pantoprazole  40 mg Oral Daily  . potassium chloride  20 mEq Oral BID  . simvastatin  10 mg Oral q1800  . sodium chloride  3 mL Intravenous Q12H  . Travoprost (BAK Free)  1 drop Both Eyes BID     Physical Exam:   General appearance: alert, cooperative and no distress Neck: no adenopathy, no JVD and supple, symmetrical, trachea midline Lungs: decreased BS at bases Heart: regular rate and rhythm and 1/6 sem Abdomen: soft, non-tender; bowel sounds normal; no masses,  no organomegaly Extremities: 1+ edema   Rate: 92  Rhythm: normal sinus rhythm  Lab Results:    Recent Labs  10/15/12 0611 10/16/12 0450  NA 139 140  K 3.6 3.6  CL 95* 95*  CO2 36* 37*  GLUCOSE 149* 147*  BUN 38* 32*  CREATININE 1.07 1.03   No results found for this basename: TROPONINI, CK, MB,  in the last 72 hours Hepatic Function Panel No results found  for this basename: PROT, ALBUMIN, AST, ALT, ALKPHOS, BILITOT, BILIDIR, IBILI,  in the last 72 hours No results found for this basename: INR,  in the last 72 hours  Lipid Panel  No results found for this basename: chol, trig, hdl, cholhdl, vldl, ldlcalc   BNP (last 3 results)  Recent Labs  10/13/12 1837  PROBNP 5030.0*    Imaging:  Nm Pulmonary Perfusion  10/16/2012  *RADIOLOGY REPORT*  Clinical Data: Pulmonary hypertension, shortness of breath, elevated D-dimer.  NM PULMONARY PERFUSION PARTICULATE  Findings:  Ventilation imaging was not obtained due to the patient's condition.  Perfusion imaging with 5. Tc62m MAA IV shows a somewhat heterogeneous distribution throughout the lungs with no discrete segmental or subsegmental perfusion defects.  IMPRESSION 1.  Low likelihood ratio for pulmonary embolism.   Original Report Authenticated By: D. 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   Unspecified hypothyroidism  Normal coronary arteries at cath 2011   Pulmonary hypertension, moderate to severe; Severe by Echo 09/2012   VQ scan low probability for PE. Weight stable from yesterday. Continue diuresis. F/U BNP in am. O2 sats in 90's.   Lennette Bihari, MD, [facility] 10/17/2012, 11:45 AM

## 2012-10-18 LAB — BASIC METABOLIC PANEL
BUN: 24 mg/dL — ABNORMAL HIGH (ref 6–23)
Calcium: 9.4 mg/dL (ref 8.4–10.5)
Creatinine, Ser: 1.11 mg/dL — ABNORMAL HIGH (ref 0.50–1.10)
GFR calc Af Amer: 55 mL/min — ABNORMAL LOW (ref >90)
GFR calc non Af Amer: 47 mL/min — ABNORMAL LOW (ref >90)
Glucose, Bld: 168 mg/dL — ABNORMAL HIGH (ref 70–99)
Potassium: 3.9 mEq/L (ref 3.5–5.1)

## 2012-10-18 LAB — GLUCOSE, CAPILLARY
Glucose-Capillary: 170 mg/dL — ABNORMAL HIGH (ref 70–99)
Glucose-Capillary: 212 mg/dL — ABNORMAL HIGH (ref 70–99)

## 2012-10-18 LAB — CBC
Hemoglobin: 15.3 g/dL — ABNORMAL HIGH (ref 12.0–15.0)
MCH: 30.1 pg (ref 26.0–34.0)
MCHC: 33.6 g/dL (ref 30.0–36.0)
Platelets: 94 10*3/uL — ABNORMAL LOW (ref 150–400)
RDW: 13 % (ref 11.5–15.5)

## 2012-10-18 MED ORDER — CARVEDILOL 3.125 MG PO TABS
3.1250 mg | ORAL_TABLET | Freq: Two times a day (BID) | ORAL | Status: DC
  Administered 2012-10-18 – 2012-10-20 (×4): 3.125 mg via ORAL
  Filled 2012-10-18 (×9): qty 1

## 2012-10-18 NOTE — Progress Notes (Signed)
[facility] Progress Note  Subjective:  Breathing better  Objective:   Vital Signs in the last 24 hours: Temp:  [97.3 F (36.3 C)-98.1 F (36.7 C)] 97.5 F (36.4 C) (02/23 0813) Pulse Rate:  [81-97] 92 (02/23 1045) Resp:  [15-21] 15 (02/23 1045) BP: (102-119)/(51-74) 112/60 mmHg (02/23 1045) SpO2:  [90 %-96 %] 90 % (02/23 1045) Weight:  [103.2 kg (227 lb 8.2 oz)] 103.2 kg (227 lb 8.2 oz) (02/23 0018)  Intake/Output from previous day: 02/22 0701 - 02/23 0700 In: 483 [P.O.:480; I.V.:3] Out: 2225 [Urine:2225]  Scheduled: . aspirin EC  81 mg Oral Daily  . brimonidine  1 drop Both Eyes BID  . diltiazem  90 mg Oral Q12H  . docusate sodium  50 mg Oral BID  . ezetimibe  10 mg Oral QHS  . furosemide  80 mg Intravenous BID  . glipiZIDE  10 mg Oral BID AC  . heparin  5,000 Units Subcutaneous Q8H  . insulin aspart  0-9 Units Subcutaneous TID WC  . levothyroxine  150 mcg Oral QAC breakfast  . linagliptin  5 mg Oral Daily  . loratadine  10 mg Oral Daily  . losartan  100 mg Oral Daily  . omega-3 acid ethyl esters  1 g Oral BID  . pantoprazole  40 mg Oral Daily  . potassium chloride  20 mEq Oral BID  . simvastatin  10 mg Oral q1800  . sodium chloride  3 mL Intravenous Q12H  . Travoprost (BAK Free)  1 drop Both Eyes BID     Physical Exam:   General appearance: alert, cooperative and no distress Neck: no carotid bruit, no JVD, supple, symmetrical, trachea midline and thyroid not enlarged, symmetric, no tenderness/mass/nodules Lungs: clear to auscultation bilaterally Heart: S1, S2 normal and 1/6 sem Abdomen: soft, non-tender; bowel sounds normal; no masses,  no organomegaly Extremities: 1+ edema   Rate: 90's  Rhythm: normal sinus rhythm  Lab Results:    Recent Labs  10/16/12 0450 10/18/12 0530  NA 140 138  K 3.6 3.9  CL 95* 97  CO2 37* 34*  GLUCOSE 147* 168*  BUN 32* 24*  CREATININE 1.03 1.11*   No results found for this basename:  TROPONINI, CK, MB,  in the last 72 hours Hepatic Function Panel No results found for this basename: PROT, ALBUMIN, AST, ALT, ALKPHOS, BILITOT, BILIDIR, IBILI,  in the last 72 hours No results found for this basename: INR,  in the last 72 hours  Lipid Panel  No results found for this basename: chol, trig, hdl, cholhdl, vldl, ldlcalc   BNP (last 3 results)  Recent Labs  10/13/12 1837 10/18/12 0530  PROBNP 5030.0* 2160.0*    Imaging:  Nm Pulmonary Perfusion  10/16/2012  *RADIOLOGY REPORT*  Clinical Data: Pulmonary hypertension, shortness of breath, elevated D-dimer.  NM PULMONARY PERFUSION PARTICULATE  Findings:  Ventilation imaging was not obtained due to the patient's condition.  Perfusion imaging with 5. Tc49m MAA IV shows a somewhat heterogeneous distribution throughout the lungs with no discrete segmental or subsegmental perfusion defects.  IMPRESSION 1.  Low likelihood ratio for pulmonary embolism.   Original Report Authenticated By: D. 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   Unspecified hypothyroidism   Normal coronary arteries at cath 2011   Pulmonary hypertension, moderate to severe; Severe by Echo 09/2012  Breathing better. Wt today 103.2 down from 106 kg yesterday. Good diuresis yesterday. Will start carvedilol at 3.125 mg bid and titrate as BP and P allow. Change 90 mg bid diltiazem to CD 180 mg daily. Transfer to telemetry. ? Home in 24 - 48 hrs.    Lennette Bihari, MD, [facility] 10/18/2012, 10:52 AM

## 2012-10-19 LAB — BASIC METABOLIC PANEL
GFR calc Af Amer: 45 mL/min — ABNORMAL LOW (ref >90)
GFR calc non Af Amer: 39 mL/min — ABNORMAL LOW (ref >90)
Glucose, Bld: 318 mg/dL — ABNORMAL HIGH (ref 70–99)
Potassium: 3.9 mEq/L (ref 3.5–5.1)
Sodium: 138 mEq/L (ref 135–145)

## 2012-10-19 LAB — GLUCOSE, CAPILLARY
Glucose-Capillary: 258 mg/dL — ABNORMAL HIGH (ref 70–99)
Glucose-Capillary: 205 mg/dL — ABNORMAL HIGH (ref 70–99)
Glucose-Capillary: 193 mg/dL — ABNORMAL HIGH (ref 70–99)

## 2012-10-19 MED ORDER — BUMETANIDE 2 MG PO TABS
2.0000 mg | ORAL_TABLET | Freq: Two times a day (BID) | ORAL | Status: DC
  Administered 2012-10-19 – 2012-10-20 (×2): 2 mg via ORAL
  Filled 2012-10-19 (×4): qty 1

## 2012-10-19 MED ORDER — DILTIAZEM HCL ER COATED BEADS 180 MG PO CP24
180.0000 mg | ORAL_CAPSULE | Freq: Every day | ORAL | Status: DC
  Administered 2012-10-20: 180 mg via ORAL
  Filled 2012-10-19: qty 1

## 2012-10-19 NOTE — Progress Notes (Signed)
Inpatient Diabetes Program Recommendations  AACE/ADA: New Consensus Statement on Inpatient Glycemic Control (2013)  Target Ranges:  Prepandial:   less than 140 mg/dL      Peak postprandial:   less than 180 mg/dL (1-2 hours)      Critically ill patients:  140 - 180 mg/dL   Reason for Visit: Post-prandial hyperglycemia Glipizide does not appear to have much affect on controlling po intake glucose levels. Would not use Glipiizide while in hospital.  Inpatient Diabetes Program Recommendations Insulin - Meal Coverage: Please add 4 units meal coverage tidwc (Given when pt eats 50% or more of meal)  Note: Thank you, Lenor Coffin, RN, CNS, Diabetes Coordinator [phone])

## 2012-10-19 NOTE — Care Management Note (Signed)
    Page 1 of 2   10/19/2012     2:38:30 PM   CARE MANAGEMENT NOTE 10/19/2012  Patient:  [name], [name]   Account Number:  [id number]  Date Initiated:  10/15/2012  Documentation initiated by:  MAYO,HENRIETTA  Subjective/Objective Assessment:   78 yr-old female adm with CHF; lives with brother and other family members live next door; has cane, walker, BSC, and home O2 through [employer]     Action/Plan:   Anticipated DC Date:     Anticipated DC Plan:        DC Planning Services  CM consult      [facility] Choice  HOME HEALTH   Choice offered to / List presented to:  C-1 Patient        HH arranged  HH-1 RN  HH-10 DISEASE MANAGEMENT  HH-2 PT      HH agency  [facility]   Status of service:  Completed, signed off Medicare Important Message given?   (If response is "NO", the following Medicare IM given date fields will be blank) Date Medicare IM given:   Date Additional Medicare IM given:    Discharge Disposition:  HOME W HOME HEALTH SERVICES  Per UR Regulation:  Reviewed for med. necessity/level of care/duration of stay  If discussed at Long Length of Stay Meetings, dates discussed:   10/20/2012    Comments:  10/14/12 1620 Henrietta Mayo RN BSN MSN CCM Met with pt and family @ bedside.  Pt agrees to home health RN for CHF mgmt/education.  Family also request PT as pt has been sedentary and needs safety eval and exercise plan. Provided pt with list of home health agencies, referral made per choice.

## 2012-10-19 NOTE — Progress Notes (Signed)
At 00253 pt had 6 beats of Vtach. Pt sleeping at the time and asymptomatic. BP 111/73. Huey Bienenstock PA notified. No new orders. Cont to monitor per day shift.

## 2012-10-19 NOTE — Progress Notes (Signed)
Subjective: No complaints, has not walked in hall yet.  Objective: Vital signs in last 24 hours: Temp:  [98.5 F (36.9 C)-98.7 F (37.1 C)] 98.5 F (36.9 C) (02/24 0500) Pulse Rate:  [84-92] 84 (02/24 0500) Resp:  [18] 18 (02/24 0500) BP: (107-119)/(67-75) 107/67 mmHg (02/24 0500) SpO2:  [91 %-92 %] 91 % (02/24 0500) Weight:  [100.7 kg (222 lb 0.1 oz)] 100.7 kg (222 lb 0.1 oz) (02/24 0500) Weight change: -2.5 kg (-5 lb 8.2 oz) Last BM Date: 10/15/12 Intake/Output from previous day: -2855  Wt admit 113.2 now 100.7  Total -13,222. 02/23 0701 - 02/24 0700 In: 520 [P.O.:520] Out: 3375 [Urine:3375] Intake/Output this shift: Total I/O In: 240 [P.O.:240] Out: -   PE: General:alert and oriented, MAE, follows command Heart:S1S2 RRR Lungs:few crackles rt base Abd:+ BS, soft, non tender Ext:1-2+ edema of lower ext.      Lab Results:  Recent Labs  10/18/12 0530  WBC 6.0  HGB 15.3*  HCT 45.6  PLT 94*   BMET  Recent Labs  10/18/12 0530  NA 138  K 3.9  CL 97  CO2 34*  GLUCOSE 168*  BUN 24*  CREATININE 1.11*  CALCIUM 9.4   No results found for this basename: TROPONINI, CK, MB,  in the last 72 hours  No results found for this basename: CHOL, HDL, LDLCALC, LDLDIRECT, TRIG, CHOLHDL   Lab Results  Component Value Date   HGBA1C 8.6* 10/14/2012     Lab Results  Component Value Date   TSH 0.120* 10/14/2012       Studies/Results: No results found.  Medications: I have reviewed the patient's current medications. Marland Kitchen aspirin EC  81 mg Oral Daily  . brimonidine  1 drop Both Eyes BID  . carvedilol  3.125 mg Oral BID WC  . diltiazem  90 mg Oral Q12H  . docusate sodium  50 mg Oral BID  . ezetimibe  10 mg Oral QHS  . furosemide  80 mg Intravenous BID  . glipiZIDE  10 mg Oral BID AC  . heparin  5,000 Units Subcutaneous Q8H  . insulin aspart  0-9 Units Subcutaneous TID WC  . levothyroxine  150 mcg Oral QAC breakfast  . linagliptin  5 mg Oral Daily  . loratadine  10 mg  Oral Daily  . losartan  100 mg Oral Daily  . omega-3 acid ethyl esters  1 g Oral BID  . pantoprazole  40 mg Oral Daily  . potassium chloride  20 mEq Oral BID  . simvastatin  10 mg Oral q1800  . sodium chloride  3 mL Intravenous Q12H  . 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   Unspecified hypothyroidism   Normal coronary arteries at cath 2011   Pulmonary hypertension, moderate to severe; Severe by Echo 09/2012  PLAN: VS stable, NSVT will check lytes, need to ambulate.  Change dilt to 24 hr dose. 180. Change lasix to po if MD agrees.   DM glucose elevated today.   LOS: 6 days   Darrio Bade R 10/19/2012, 1:28 PM

## 2012-10-19 NOTE — Progress Notes (Addendum)
Pt. Seen and examined. Agree with the NP/PA-C note as written.  Change to po bumex today. Breathing improved. Will change cardizem to LA dose. Plan probable d/c tomorrow.  Chrystie Nose, MD, [facility] Attending Cardiologist [facility]

## 2012-10-20 LAB — BASIC METABOLIC PANEL
Calcium: 9.9 mg/dL (ref 8.4–10.5)
Creatinine, Ser: 1.19 mg/dL — ABNORMAL HIGH (ref 0.50–1.10)
GFR calc Af Amer: 50 mL/min — ABNORMAL LOW (ref >90)

## 2012-10-20 LAB — GLUCOSE, CAPILLARY: Glucose-Capillary: 197 mg/dL — ABNORMAL HIGH (ref 70–99)

## 2012-10-20 LAB — PRO B NATRIURETIC PEPTIDE: Pro B Natriuretic peptide (BNP): 2407 pg/mL — ABNORMAL HIGH (ref 0–450)

## 2012-10-20 MED ORDER — ASPIRIN 81 MG PO TBEC: 81.0000 mg | DELAYED_RELEASE_TABLET | Freq: Every day | ORAL | Status: DC

## 2012-10-20 MED ORDER — BUMETANIDE 2 MG PO TABS: 2.0000 mg | ORAL_TABLET | Freq: Two times a day (BID) | ORAL | Status: DC

## 2012-10-20 MED ORDER — DILTIAZEM HCL ER COATED BEADS 180 MG PO CP24: 180.0000 mg | ORAL_CAPSULE | Freq: Every day | ORAL | Status: DC

## 2012-10-20 MED ORDER — POTASSIUM CHLORIDE CRYS ER 20 MEQ PO TBCR: 20.0000 meq | EXTENDED_RELEASE_TABLET | Freq: Two times a day (BID) | ORAL | Status: DC

## 2012-10-20 MED ORDER — INSULIN ASPART 100 UNIT/ML ~~LOC~~ SOLN
4.0000 [IU] | Freq: Three times a day (TID) | SUBCUTANEOUS | Status: DC
  Administered 2012-10-20: 4 [IU] via SUBCUTANEOUS

## 2012-10-20 MED ORDER — CARVEDILOL 3.125 MG PO TABS: 3.1250 mg | ORAL_TABLET | Freq: Two times a day (BID) | ORAL | Status: DC

## 2012-10-20 MED ORDER — LOSARTAN POTASSIUM 100 MG PO TABS: 100.0000 mg | ORAL_TABLET | Freq: Every day | ORAL | Status: DC

## 2012-10-20 NOTE — Discharge Summary (Signed)
Physician Discharge Summary  Patient ID: [name] MRN: [id number] DOB/AGE: May 20, 1935 78 y.o.  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   Unspecified hypothyroidism   Normal coronary arteries at cath 2011   Pulmonary hypertension, moderate to severe; Severe by Echo 09/2012   Discharged Condition: stable  Hospital Course: The patient is a 78 y/o, morbidly obese, female with known pulmonary hypertension and OSA who presented to the [facility] ER on 10/13/12 with complaints of worsening shortness of breath, peripheral edema and weight gain. At time of presentation, BNP was 5,030. A CXR showed cardiomegaly with vascular congestion. Troponin level was elevated at 0.62 and an EKG  showed TWIs in anterior and inferior leads. However, the patient had angiographically normal coronaries in 2011 and was not endorsing chest pain or pressure. Serial troponins were followed and were trending downward. Dr. [name], who saw her in consult, felt that the elevation in cardiac enzymes were likely due to her acute exacerbation of HF and not due to ACS. She was placed on IV Lasix and had good diuresis, as well as improvement of symptoms. An echo was obtained and findings were suggestive of Cor Pulmonale with massive RV/RA dilation and severe pulmonary hypertension. She had normal systolic function with an EF of 55-65%. The patient had also been tachycardic with rates in the low 100's. In the setting of severe RV/RA enlargement and tachycardia, a d-Dimer was obtained, which was elevated at 1.16. It was then decided  to have the patient undergo further testing to rule out a pulmonary embolus. A V/Q scan was ordered. However, ventilation imaging was not obtained due to the patient's condition. Perfusion imaging showed heterogeneous distribution throughout the lungs with no discrete segmental or subsegmental perfusion defects. The radiologist determined a low likelihood ratio for pulmonary embolism. Diltiazem and Coreg were both added for her tachycardia. She tolerated this well and BP remained stable. She continued to be diuresed with lasix. She was then switched over to Bumex and also had good diuresis with the change. By hospital day number 7, she was stable and her symptoms had improved. She had a net diuresis of 2.17 L, since time of admission. BNP was down to 2,407.0. Her weight was 216 lb (98.2 kg), which was roughly 32 lbs less than admission weight (248 lbs, 112kg).  She was last seen and examined by Dr. Rennis Golden, who felt she was stable for discharge home. The patient lives in [place] and requested to follow-up there. She is scheduled to follow up with Dr. Rennis Golden in 7 days.  She is instructed to obtain both a BMP and BNP prior to follow-up. She has also been advised to arrange follow-up with Dr. Juanetta Gosling, Pulmonologist, in [place].   Consults: None  Significant Diagnostic Studies:   2D echo - 2/19 Study Conclusions - Left ventricle: The cavity size was normal. Wall thickness was increased in a pattern of moderate LVH. There was moderate concentric hypertrophy. Systolic function was normal. The estimated ejection fraction was in the range of 55% to 65%. Although no diagnostic regional wall motion abnormality was identified, this possibility cannot be completely excluded on  the basis of this study. Doppler parameters are consistent with abnormal left ventricular relaxation (grade 1 diastolic dysfunction). - Ventricular septum: These changes are consistent with RV volume and pressure overload. - Aortic  valve: Mildly calcified annulus. Trileaflet; normal thickness, mildly calcified leaflets. - Mitral valve: Calcified annulus. Mildly thickened leaflets - Right ventricle: The cavity size was severely dilated. Wall thickness was normal. Systolic function was mildly reduced. The estimated ejection fraction was , by visual assessment. - Right atrium: The atrium was moderately to severely dilated. - Pulmonary arteries: PA peak pressure: 78mm Hg (S). Impressions:  - Consistent with cor pulmonale, with elevated right-sided filling pressure and normal left-sided filling pressure. Pulmonary hypertension, with mild RV failure, due to primary pulmonary hypertension. Transthoracic echocardiography. M-mode, complete 2D, spectral Doppler, and color Doppler. Height: Height: 154.9cm. Height: 61in. Weight: Weight: 112.5kg. Weight: 247.5lb. Body mass index: BMI: 46.9kg/m^2. Body surface area: BSA: 2.63m^2. Blood pressure: 128/79. Patient status: Inpatient. Location: ICU/CCU   NM Pulmonary Perfusion Study 10/16/12  *RADIOLOGY REPORT*  Clinical Data: Pulmonary hypertension, shortness of breath,  elevated D-dimer.  NM PULMONARY PERFUSION PARTICULATE  Findings: Ventilation imaging was not obtained due to the  patient's condition. Perfusion imaging with 5. Tc108m MAA IV  shows a somewhat heterogeneous distribution throughout the lungs  with no discrete segmental or subsegmental perfusion defects.  IMPRESSION  1. Low likelihood ratio for pulmonary embolism.  Original Report Authenticated By: D. Andria Rhein, MD    Treatments: See Hospital Course  Discharge Exam: Blood pressure 112/81, pulse 90, temperature 97.3 F (36.3 C), temperature source Oral, resp. rate 18, height 5\' 1"  (1.549 m), weight 216 lb 7.9 oz (98.2 kg), SpO2 94.00%.   Disposition: 01-Home or Self Care  Discharge Orders   Future Orders Complete By Expires     Diet - low sodium heart healthy  As directed     Increase activity  slowly  As directed         Medication List    STOP taking these medications       losartan-hydrochlorothiazide 100-25 MG per tablet  Commonly known as:  HYZAAR     NIFEdipine 30 MG 24 hr tablet  Commonly known as:  PROCARDIA-XL/ADALAT-CC/NIFEDICAL-XL      TAKE these medications       ALLERGY RELIEF 180 MG tablet  Generic drug:  fexofenadine  Take 180 mg by mouth daily.     ALPHAGAN P OP  Place 1 drop into both eyes 2 (two) times daily.     ALPRAZolam 0.5 MG tablet  Commonly known as:  XANAX  Take 0.5 mg by mouth 4 (four) times daily as needed for anxiety.     aspirin 81 MG EC tablet  Take 1 tablet (81 mg total) by mouth daily.     bumetanide 2 MG tablet  Commonly known as:  BUMEX  Take 1 tablet (2 mg total) by mouth 2 (two) times daily.     carvedilol 3.125 MG tablet  Commonly known as:  COREG  Take 1 tablet (3.125 mg total) by mouth 2 (two) times daily with a meal.     diltiazem 180 MG 24 hr capsule  Commonly known as:  CARDIZEM CD  Take 1 capsule (180 mg total) by mouth daily.     docusate sodium 50 MG capsule  Commonly known as:  COLACE  Take 50 mg by mouth 2 (two) times daily.     ezetimibe 10 MG tablet  Commonly known as:  ZETIA  Take 10  mg by mouth at bedtime.     fish oil-omega-3 fatty acids 1000 MG capsule  Take 1 g by mouth 2 (two) times daily.     glipiZIDE 10 MG tablet  Commonly known as:  GLUCOTROL  Take 10 mg by mouth 2 (two) times daily before a meal.     lansoprazole 30 MG capsule  Commonly known as:  PREVACID  Take 30 mg by mouth daily.     levothyroxine 150 MCG tablet  Commonly known as:  SYNTHROID, LEVOTHROID  Take 150 mcg by mouth daily.     linagliptin 5 MG Tabs tablet  Commonly known as:  TRADJENTA  Take 5 mg by mouth daily.     losartan 100 MG tablet  Commonly known as:  COZAAR  Take 1 tablet (100 mg total) by mouth daily.     oxyCODONE-acetaminophen 5-325 MG per tablet  Commonly known as:  PERCOCET/ROXICET  Take 1  tablet by mouth every 4 (four) hours as needed for pain.     potassium chloride SA 20 MEQ tablet  Commonly known as:  K-DUR,KLOR-CON  Take 1 tablet (20 mEq total) by mouth 2 (two) times daily.     pravastatin 20 MG tablet  Commonly known as:  PRAVACHOL  Take 20 mg by mouth daily.     Travoprost (BAK Free) 0.004 % Soln ophthalmic solution  Commonly known as:  TRAVATAN  Place 1 drop into both eyes 2 (two) times daily.           Follow-up Information   Follow up with Chrystie Nose, MD On 10/27/2012. (3:30pm)    Contact information:   [address] [place] [address] [phone]      TIME SPENT ON DISCHARGE: 45 MINUTES  Signed: Allayne Butcher, PA-C 10/20/2012, 3:19 PM

## 2012-10-20 NOTE — Progress Notes (Signed)
[facility]  DAILY PROGRESS NOTE   Subjective:  Tolerating cardizem - HR and BP stable. Still with 2L net negative yesterday. Switched over to PO bumex last night.  Objective:  Temp:  [97.6 F (36.4 C)-98.7 F (37.1 C)] 97.6 F (36.4 C) (02/25 0540) Pulse Rate:  [77-88] 77 (02/25 0540) Resp:  [18] 18 (02/25 0540) BP: (106-124)/(64-74) 106/71 mmHg (02/25 0540) SpO2:  [92 %-95 %] 92 % (02/25 0540) Weight:  [98.2 kg (216 lb 7.9 oz)] 98.2 kg (216 lb 7.9 oz) (02/25 0540) Weight change: -2.5 kg (-5 lb 8.2 oz)  Intake/Output from previous day: 02/24 0701 - 02/25 0700 In: 882 [P.O.:882] Out: 3050 [Urine:3050]  Intake/Output from this shift:    Medications: Current Facility-Administered Medications  Medication Dose Route Frequency Provider Last Rate Last Dose  . 0.9 %  sodium chloride infusion  250 mL Intravenous PRN Hillary Bow, DO      . acetaminophen (TYLENOL) tablet 650 mg  650 mg Oral Q4H PRN Hillary Bow, DO      . ALPRAZolam Prudy Feeler) tablet 0.5 mg  0.5 mg Oral QID PRN Hillary Bow, DO   0.5 mg at 10/19/12 2153  . alum & mag hydroxide-simeth (MAALOX/MYLANTA) 200-200-20 MG/5ML suspension 15 mL  15 mL Oral Q6H PRN Leda Gauze, NP   15 mL at 10/14/12 2237  . aspirin EC tablet 81 mg  81 mg Oral Daily Hillary Bow, DO   81 mg at 10/20/12 1046  . brimonidine (ALPHAGAN) 0.2 % ophthalmic solution 1 drop  1 drop Both Eyes BID Hillary Bow, DO   1 drop at 10/19/12 2145  . bumetanide (BUMEX) tablet 2 mg  2 mg Oral BID Chrystie Nose, MD   2 mg at 10/20/12 0818  . carvedilol (COREG) tablet 3.125 mg  3.125 mg Oral BID WC Lennette Bihari, MD   3.125 mg at 10/20/12 1884  . diltiazem (CARDIZEM CD) 24 hr capsule 180 mg  180 mg Oral Daily Chrystie Nose, MD      . docusate sodium (COLACE) capsule 50 mg  50 mg Oral BID Hillary Bow, DO   50 mg at 10/19/12 2145  . ezetimibe (ZETIA) tablet 10 mg  10 mg Oral QHS Hillary Bow, DO    10 mg at 10/19/12 2144  . glipiZIDE (GLUCOTROL) tablet 10 mg  10 mg Oral BID AC Hillary Bow, DO   10 mg at 10/20/12 1045  . heparin injection 5,000 Units  5,000 Units Subcutaneous Q8H Hillary Bow, DO   5,000 Units at 10/20/12 [phone]  . insulin aspart (novoLOG) injection 0-9 Units  0-9 Units Subcutaneous TID WC Elease Etienne, MD   2 Units at 10/20/12 0818  . insulin aspart (novoLOG) injection 4 Units  4 Units Subcutaneous TID WC Chrystie Nose, MD      . levothyroxine (SYNTHROID, LEVOTHROID) tablet 150 mcg  150 mcg Oral QAC breakfast Hillary Bow, DO   150 mcg at 10/20/12 [phone]  . linagliptin (TRADJENTA) tablet 5 mg  5 mg Oral Daily Hillary Bow, DO   5 mg at 10/19/12 6010  . loratadine (CLARITIN) tablet 10 mg  10 mg Oral Daily Hillary Bow, DO   10 mg at 10/20/12 1045  . losartan (COZAAR) tablet 100 mg  100 mg Oral Daily Hillary Bow, DO   100 mg at 10/19/12 9323  . omega-3 acid ethyl esters (LOVAZA) capsule  1 g  1 g Oral BID Hillary Bow, DO   1 g at 10/20/12 1046  . ondansetron (ZOFRAN) injection 4 mg  4 mg Intravenous Q6H PRN Hillary Bow, DO      . oxyCODONE-acetaminophen (PERCOCET/ROXICET) 5-325 MG per tablet 1 tablet  1 tablet Oral Q4H PRN Hillary Bow, DO   1 tablet at 10/18/12 2021  . pantoprazole (PROTONIX) EC tablet 40 mg  40 mg Oral Daily Hillary Bow, DO   40 mg at 10/20/12 1045  . potassium chloride SA (K-DUR,KLOR-CON) CR tablet 20 mEq  20 mEq Oral BID Eda Paschal [place], [place]   20 mEq at 10/19/12 2144  . simvastatin (ZOCOR) tablet 10 mg  10 mg Oral q1800 Hillary Bow, DO   10 mg at 10/19/12 1755  . sodium chloride 0.9 % injection 3 mL  3 mL Intravenous Q12H Hillary Bow, DO   3 mL at 10/19/12 1610  . sodium chloride 0.9 % injection 3 mL  3 mL Intravenous PRN Hillary Bow, DO   3 mL at 10/14/12 2145  . Travoprost (BAK Free) (TRAVATAN) 0.004 % ophthalmic solution SOLN 1 drop  1 drop Both Eyes BID Hillary Bow, DO   1 drop at 10/19/12  2145    Physical Exam: General appearance: alert and no distress Neck: no adenopathy, no carotid bruit, no JVD, supple, symmetrical, trachea midline and thyroid not enlarged, symmetric, no tenderness/mass/nodules Lungs: diminished breath sounds bilaterally Heart: regular rate and rhythm, S1, S2 normal, no murmur, click, rub or gallop Abdomen: soft, non-tender; bowel sounds normal; no masses,  no organomegaly Extremities: extremities normal, atraumatic, no cyanosis or edema Pulses: 2+ and symmetric Skin: Skin color, texture, turgor normal. No rashes or lesions Neurologic: Grossly normal  Lab Results: Results for orders placed during the hospital encounter of 10/13/12 (from the past 48 hour(s))  GLUCOSE, CAPILLARY     Status: Abnormal   Collection Time    10/18/12 11:28 AM      Result Value Range   Glucose-Capillary 212 (*) 70 - 99 mg/dL  GLUCOSE, CAPILLARY     Status: Abnormal   Collection Time    10/18/12  4:39 PM      Result Value Range   Glucose-Capillary 234 (*) 70 - 99 mg/dL  GLUCOSE, CAPILLARY     Status: Abnormal   Collection Time    10/18/12  8:58 PM      Result Value Range   Glucose-Capillary 145 (*) 70 - 99 mg/dL  GLUCOSE, CAPILLARY     Status: Abnormal   Collection Time    10/19/12  7:26 AM      Result Value Range   Glucose-Capillary 161 (*) 70 - 99 mg/dL   Comment 1 Notify RN    GLUCOSE, CAPILLARY     Status: Abnormal   Collection Time    10/19/12 11:30 AM      Result Value Range   Glucose-Capillary 258 (*) 70 - 99 mg/dL   Comment 1 Notify RN    BASIC METABOLIC PANEL     Status: Abnormal   Collection Time    10/19/12  2:38 PM      Result Value Range   Sodium 138  135 - 145 mEq/L   Potassium 3.9  3.5 - 5.1 mEq/L   Chloride 95 (*) 96 - 112 mEq/L   CO2 30  19 - 32 mEq/L   Glucose, Bld 318 (*) 70 - 99 mg/dL   BUN  26 (*) 6 - 23 mg/dL   Creatinine, Ser 9.60 (*) 0.50 - 1.10 mg/dL   Calcium 9.9  8.4 - 45.4 mg/dL   GFR calc non Af Amer 39 (*) >90 mL/min   GFR  calc Af Amer 45 (*) >90 mL/min   Comment:            The eGFR has been calculated     using the CKD EPI equation.     This calculation has not been     validated in all clinical     situations.     eGFR's persistently     <90 mL/min signify     possible Chronic Kidney Disease.  MAGNESIUM     Status: None   Collection Time    10/19/12  2:38 PM      Result Value Range   Magnesium 1.9  1.5 - 2.5 mg/dL  GLUCOSE, CAPILLARY     Status: Abnormal   Collection Time    10/19/12  4:35 PM      Result Value Range   Glucose-Capillary 205 (*) 70 - 99 mg/dL   Comment 1 Notify RN    GLUCOSE, CAPILLARY     Status: Abnormal   Collection Time    10/19/12  9:32 PM      Result Value Range   Glucose-Capillary 193 (*) 70 - 99 mg/dL  BASIC METABOLIC PANEL     Status: Abnormal   Collection Time    10/20/12  5:00 AM      Result Value Range   Sodium 141  135 - 145 mEq/L   Potassium 4.1  3.5 - 5.1 mEq/L   Chloride 98  96 - 112 mEq/L   CO2 32  19 - 32 mEq/L   Glucose, Bld 188 (*) 70 - 99 mg/dL   BUN 26 (*) 6 - 23 mg/dL   Creatinine, Ser 0.98 (*) 0.50 - 1.10 mg/dL   Calcium 9.9  8.4 - 11.9 mg/dL   GFR calc non Af Amer 44 (*) >90 mL/min   GFR calc Af Amer 50 (*) >90 mL/min   Comment:            The eGFR has been calculated     using the CKD EPI equation.     This calculation has not been     validated in all clinical     situations.     eGFR's persistently     <90 mL/min signify     possible Chronic Kidney Disease.  PRO B NATRIURETIC PEPTIDE     Status: Abnormal   Collection Time    10/20/12  5:00 AM      Result Value Range   Pro B Natriuretic peptide (BNP) 2407.0 (*) 0 - 450 pg/mL  GLUCOSE, CAPILLARY     Status: Abnormal   Collection Time    10/20/12  7:50 AM      Result Value Range   Glucose-Capillary 175 (*) 70 - 99 mg/dL    Imaging: No results found.  Assessment:  1. Principal Problem: 2.   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 Active Problems: 4.   Dyspnea 5.   Chronic respiratory failure with hypoxia 6.   DM2 (diabetes mellitus, type 2) 7.   Elevated troponin - most likely related to Cor Pulmonale. 8.   Unspecified hypothyroidism 9.   Normal coronary arteries at cath 2011 10.   Pulmonary hypertension, moderate to severe; Severe by  Echo 09/2012 11.   Plan:  1. Doing well. Ambulating without too much difficulty. Seems to be diuresing well on bumex. OK for discharge home today. Will have advanced home health services. Follow-up with [facility] in 5-7 days. Will need re-check BMP/BNP as outpatient, may need mild decrease in bumex depending on labs, weight, etc., but I'd rather her be on a slightly higher dose for now. Continue home O2 3L.  Will need pulmonary follow-up (she saw Dr. Craige Cotta, but was also referred to Dr. Juanetta Gosling in [place] but did not see him yet).  Time Spent Directly with Patient:  15 minutes  Length of Stay:  LOS: 7 days   Chrystie Nose, MD, [facility] Attending Cardiologist [facility]  HILTY,Kenneth C 10/20/2012, 10:46 AM

## 2012-12-25 ENCOUNTER — Inpatient Hospital Stay (HOSPITAL_COMMUNITY)
Admission: EM | Admit: 2012-12-25 | Discharge: 2012-12-28 | DRG: 683 | Disposition: A | Discharge status: Home Health | Payer: Medicare Other | Attending: Internal Medicine | Admitting: Internal Medicine

## 2012-12-25 ENCOUNTER — Encounter (HOSPITAL_COMMUNITY): Payer: Self-pay | Admitting: Emergency Medicine

## 2012-12-25 ENCOUNTER — Emergency Department (HOSPITAL_COMMUNITY): Payer: Medicare Other

## 2012-12-25 DIAGNOSIS — E119 Type 2 diabetes mellitus without complications: Secondary | ICD-10-CM

## 2012-12-25 DIAGNOSIS — J9611 Chronic respiratory failure with hypoxia: Secondary | ICD-10-CM

## 2012-12-25 DIAGNOSIS — I272 Pulmonary hypertension, unspecified: Secondary | ICD-10-CM

## 2012-12-25 DIAGNOSIS — G4733 Obstructive sleep apnea (adult) (pediatric): Secondary | ICD-10-CM | POA: Diagnosis present

## 2012-12-25 DIAGNOSIS — E871 Hypo-osmolality and hyponatremia: Secondary | ICD-10-CM | POA: Diagnosis not present

## 2012-12-25 DIAGNOSIS — N289 Disorder of kidney and ureter, unspecified: Secondary | ICD-10-CM

## 2012-12-25 DIAGNOSIS — H409 Unspecified glaucoma: Secondary | ICD-10-CM | POA: Diagnosis present

## 2012-12-25 DIAGNOSIS — I129 Hypertensive chronic kidney disease with stage 1 through stage 4 chronic kidney disease, or unspecified chronic kidney disease: Secondary | ICD-10-CM | POA: Diagnosis present

## 2012-12-25 DIAGNOSIS — Z9119 Patient's noncompliance with other medical treatment and regimen: Secondary | ICD-10-CM

## 2012-12-25 DIAGNOSIS — J9610 Chronic respiratory failure, unspecified whether with hypoxia or hypercapnia: Secondary | ICD-10-CM | POA: Diagnosis present

## 2012-12-25 DIAGNOSIS — J4489 Other specified chronic obstructive pulmonary disease: Secondary | ICD-10-CM | POA: Diagnosis present

## 2012-12-25 DIAGNOSIS — I509 Heart failure, unspecified: Secondary | ICD-10-CM | POA: Diagnosis present

## 2012-12-25 DIAGNOSIS — I951 Orthostatic hypotension: Secondary | ICD-10-CM | POA: Diagnosis present

## 2012-12-25 DIAGNOSIS — J449 Chronic obstructive pulmonary disease, unspecified: Secondary | ICD-10-CM | POA: Diagnosis present

## 2012-12-25 DIAGNOSIS — Z9981 Dependence on supplemental oxygen: Secondary | ICD-10-CM

## 2012-12-25 DIAGNOSIS — I998 Other disorder of circulatory system: Secondary | ICD-10-CM | POA: Diagnosis present

## 2012-12-25 DIAGNOSIS — I5032 Chronic diastolic (congestive) heart failure: Secondary | ICD-10-CM

## 2012-12-25 DIAGNOSIS — E869 Volume depletion, unspecified: Secondary | ICD-10-CM

## 2012-12-25 DIAGNOSIS — N390 Urinary tract infection, site not specified: Secondary | ICD-10-CM

## 2012-12-25 DIAGNOSIS — Z8249 Family history of ischemic heart disease and other diseases of the circulatory system: Secondary | ICD-10-CM

## 2012-12-25 DIAGNOSIS — I2789 Other specified pulmonary heart diseases: Secondary | ICD-10-CM | POA: Diagnosis present

## 2012-12-25 DIAGNOSIS — I872 Venous insufficiency (chronic) (peripheral): Secondary | ICD-10-CM | POA: Diagnosis present

## 2012-12-25 DIAGNOSIS — N179 Acute kidney failure, unspecified: Principal | ICD-10-CM

## 2012-12-25 DIAGNOSIS — I959 Hypotension, unspecified: Secondary | ICD-10-CM

## 2012-12-25 DIAGNOSIS — Z833 Family history of diabetes mellitus: Secondary | ICD-10-CM

## 2012-12-25 DIAGNOSIS — Z794 Long term (current) use of insulin: Secondary | ICD-10-CM

## 2012-12-25 DIAGNOSIS — Z79899 Other long term (current) drug therapy: Secondary | ICD-10-CM

## 2012-12-25 DIAGNOSIS — Z91199 Patient's noncompliance with other medical treatment and regimen due to unspecified reason: Secondary | ICD-10-CM

## 2012-12-25 DIAGNOSIS — Z6841 Body Mass Index (BMI) 40.0 and over, adult: Secondary | ICD-10-CM

## 2012-12-25 DIAGNOSIS — E039 Hypothyroidism, unspecified: Secondary | ICD-10-CM

## 2012-12-25 DIAGNOSIS — R339 Retention of urine, unspecified: Secondary | ICD-10-CM | POA: Diagnosis present

## 2012-12-25 DIAGNOSIS — R7989 Other specified abnormal findings of blood chemistry: Secondary | ICD-10-CM

## 2012-12-25 DIAGNOSIS — R0609 Other forms of dyspnea: Secondary | ICD-10-CM

## 2012-12-25 DIAGNOSIS — D72829 Elevated white blood cell count, unspecified: Secondary | ICD-10-CM

## 2012-12-25 DIAGNOSIS — K219 Gastro-esophageal reflux disease without esophagitis: Secondary | ICD-10-CM | POA: Diagnosis present

## 2012-12-25 DIAGNOSIS — R748 Abnormal levels of other serum enzymes: Secondary | ICD-10-CM | POA: Diagnosis present

## 2012-12-25 DIAGNOSIS — N189 Chronic kidney disease, unspecified: Secondary | ICD-10-CM | POA: Diagnosis present

## 2012-12-25 DIAGNOSIS — I2729 Other secondary pulmonary hypertension: Secondary | ICD-10-CM

## 2012-12-25 DIAGNOSIS — R300 Dysuria: Secondary | ICD-10-CM

## 2012-12-25 HISTORY — DX: Unspecified glaucoma: H40.9

## 2012-12-25 HISTORY — DX: Heart failure, unspecified: I50.9

## 2012-12-25 LAB — SODIUM, URINE, RANDOM: Sodium, Ur: <10 mEq/L

## 2012-12-25 LAB — CBC WITH DIFFERENTIAL/PLATELET
Basophils Relative: 0 % (ref 0–1)
Eosinophils Absolute: 0 10*3/uL (ref 0.0–0.7)
HCT: 45.2 % (ref 36.0–46.0)
Hemoglobin: 15.3 g/dL — ABNORMAL HIGH (ref 12.0–15.0)
MCH: 29.8 pg (ref 26.0–34.0)
MCHC: 33.8 g/dL (ref 30.0–36.0)
Monocytes Absolute: 1.3 10*3/uL — ABNORMAL HIGH (ref 0.1–1.0)
Monocytes Relative: 9 % (ref 3–12)
Neutrophils Relative %: 81 % — ABNORMAL HIGH (ref 43–77)
RDW: 14.6 % (ref 11.5–15.5)

## 2012-12-25 LAB — URINALYSIS, ROUTINE W REFLEX MICROSCOPIC
Ketones, ur: 15 mg/dL — AB
Nitrite: NEGATIVE
Protein, ur: NEGATIVE mg/dL
pH: 5 (ref 5.0–8.0)

## 2012-12-25 LAB — URINE MICROSCOPIC-ADD ON

## 2012-12-25 LAB — COMPREHENSIVE METABOLIC PANEL
Albumin: 3.4 g/dL — ABNORMAL LOW (ref 3.5–5.2)
BUN: 32 mg/dL — ABNORMAL HIGH (ref 6–23)
Creatinine, Ser: 2 mg/dL — ABNORMAL HIGH (ref 0.50–1.10)
Total Protein: 6.5 g/dL (ref 6.0–8.3)

## 2012-12-25 LAB — PRO B NATRIURETIC PEPTIDE: Pro B Natriuretic peptide (BNP): 6214 pg/mL — ABNORMAL HIGH (ref 0–450)

## 2012-12-25 LAB — CG4 I-STAT (LACTIC ACID): Lactic Acid, Venous: 1.99 mmol/L (ref 0.5–2.2)

## 2012-12-25 MED ORDER — BRIMONIDINE TARTRATE 0.2 % OP SOLN
1.0000 [drp] | Freq: Two times a day (BID) | OPHTHALMIC | Status: DC
  Administered 2012-12-25 – 2012-12-28 (×6): 1 [drp] via OPHTHALMIC
  Filled 2012-12-25: qty 5

## 2012-12-25 MED ORDER — HEPARIN SODIUM (PORCINE) 5000 UNIT/ML IJ SOLN
5000.0000 [IU] | Freq: Three times a day (TID) | INTRAMUSCULAR | Status: DC
  Administered 2012-12-25 – 2012-12-27 (×5): 5000 [IU] via SUBCUTANEOUS
  Filled 2012-12-25 (×11): qty 1

## 2012-12-25 MED ORDER — SODIUM CHLORIDE 0.9 % IV BOLUS (SEPSIS)
1000.0000 mL | Freq: Once | INTRAVENOUS | Status: AC
  Administered 2012-12-25: 1000 mL via INTRAVENOUS

## 2012-12-25 MED ORDER — LORATADINE 10 MG PO TABS
10.0000 mg | ORAL_TABLET | Freq: Every day | ORAL | Status: DC
  Administered 2012-12-26 – 2012-12-28 (×3): 10 mg via ORAL
  Filled 2012-12-25 (×3): qty 1

## 2012-12-25 MED ORDER — ALPRAZOLAM 0.5 MG PO TABS
0.5000 mg | ORAL_TABLET | Freq: Four times a day (QID) | ORAL | Status: DC | PRN
  Administered 2012-12-26 – 2012-12-27 (×2): 0.5 mg via ORAL
  Filled 2012-12-25 (×2): qty 1

## 2012-12-25 MED ORDER — ASPIRIN 81 MG PO TBEC
81.0000 mg | DELAYED_RELEASE_TABLET | Freq: Every day | ORAL | Status: DC
  Administered 2012-12-26 – 2012-12-28 (×3): 81 mg via ORAL
  Filled 2012-12-25 (×3): qty 1

## 2012-12-25 MED ORDER — LEVOTHYROXINE SODIUM 150 MCG PO TABS
150.0000 ug | ORAL_TABLET | Freq: Every day | ORAL | Status: DC
  Administered 2012-12-26 – 2012-12-28 (×3): 150 ug via ORAL
  Filled 2012-12-25 (×5): qty 1

## 2012-12-25 MED ORDER — ONDANSETRON HCL 4 MG/2ML IJ SOLN: 4.0000 mg | Freq: Four times a day (QID) | INTRAMUSCULAR | Status: DC | PRN

## 2012-12-25 MED ORDER — ONDANSETRON HCL 4 MG PO TABS: 4.0000 mg | ORAL_TABLET | Freq: Four times a day (QID) | ORAL | Status: DC | PRN

## 2012-12-25 MED ORDER — CEFTRIAXONE SODIUM 250 MG IJ SOLR
250.0000 mg | Freq: Once | INTRAMUSCULAR | Status: AC
  Administered 2012-12-25: 250 mg via INTRAMUSCULAR
  Filled 2012-12-25: qty 250

## 2012-12-25 MED ORDER — SODIUM CHLORIDE 0.9 % IV SOLN: 250.0000 mL | INTRAVENOUS | Status: DC | PRN

## 2012-12-25 MED ORDER — LIDOCAINE HCL (PF) 1 % IJ SOLN
INTRAMUSCULAR | Status: AC
  Administered 2012-12-25: 2 mL
  Filled 2012-12-25: qty 5

## 2012-12-25 MED ORDER — OXYCODONE-ACETAMINOPHEN 5-325 MG PO TABS
1.0000 | ORAL_TABLET | ORAL | Status: DC | PRN
  Administered 2012-12-25 – 2012-12-26 (×2): 1 via ORAL
  Filled 2012-12-25 (×2): qty 1

## 2012-12-25 MED ORDER — SODIUM CHLORIDE 0.9 % IJ SOLN
3.0000 mL | Freq: Two times a day (BID) | INTRAMUSCULAR | Status: DC
  Administered 2012-12-25 – 2012-12-28 (×6): 3 mL via INTRAVENOUS

## 2012-12-25 MED ORDER — SODIUM CHLORIDE 0.9 % IJ SOLN: 3.0000 mL | INTRAMUSCULAR | Status: DC | PRN

## 2012-12-25 MED ORDER — TRAVOPROST (BAK FREE) 0.004 % OP SOLN
1.0000 [drp] | Freq: Two times a day (BID) | OPHTHALMIC | Status: DC
  Administered 2012-12-25 – 2012-12-28 (×6): 1 [drp] via OPHTHALMIC
  Filled 2012-12-25: qty 2.5

## 2012-12-25 MED ORDER — INSULIN ASPART 100 UNIT/ML ~~LOC~~ SOLN
0.0000 [IU] | Freq: Three times a day (TID) | SUBCUTANEOUS | Status: DC
  Administered 2012-12-26: 2 [IU] via SUBCUTANEOUS
  Administered 2012-12-26: 13:00:00 via SUBCUTANEOUS
  Administered 2012-12-26 – 2012-12-27 (×2): 1 [IU] via SUBCUTANEOUS
  Administered 2012-12-27: 2 [IU] via SUBCUTANEOUS
  Administered 2012-12-28: 1 [IU] via SUBCUTANEOUS
  Administered 2012-12-28: 3 [IU] via SUBCUTANEOUS

## 2012-12-25 MED ORDER — CIPROFLOXACIN HCL 250 MG PO TABS
250.0000 mg | ORAL_TABLET | ORAL | Status: DC
  Administered 2012-12-25 – 2012-12-27 (×3): 250 mg via ORAL
  Filled 2012-12-25 (×4): qty 1

## 2012-12-25 MED ORDER — SODIUM CHLORIDE 0.9 % IJ SOLN: 3.0000 mL | Freq: Two times a day (BID) | INTRAMUSCULAR | Status: DC

## 2012-12-25 MED ORDER — BRIMONIDINE TARTRATE 0.15 % OP SOLN: 1.0000 [drp] | Freq: Two times a day (BID) | OPHTHALMIC | Status: DC

## 2012-12-25 MED ORDER — INSULIN GLARGINE 100 UNIT/ML ~~LOC~~ SOLN
5.0000 [IU] | Freq: Every day | SUBCUTANEOUS | Status: DC
  Administered 2012-12-25 – 2012-12-27 (×3): 5 [IU] via SUBCUTANEOUS
  Filled 2012-12-25 (×4): qty 0.05

## 2012-12-25 MED ORDER — EZETIMIBE 10 MG PO TABS
10.0000 mg | ORAL_TABLET | Freq: Every day | ORAL | Status: DC
  Administered 2012-12-25 – 2012-12-27 (×3): 10 mg via ORAL
  Filled 2012-12-25 (×4): qty 1

## 2012-12-25 MED ORDER — SIMVASTATIN 10 MG PO TABS
10.0000 mg | ORAL_TABLET | Freq: Every day | ORAL | Status: DC
  Administered 2012-12-25 – 2012-12-27 (×3): 10 mg via ORAL
  Filled 2012-12-25 (×4): qty 1

## 2012-12-25 MED ORDER — INSULIN ASPART 100 UNIT/ML ~~LOC~~ SOLN
0.0000 [IU] | Freq: Every day | SUBCUTANEOUS | Status: DC
  Administered 2012-12-25: 2 [IU] via SUBCUTANEOUS

## 2012-12-25 NOTE — ED Notes (Signed)
Increased to 5 LNC

## 2012-12-25 NOTE — ED Notes (Signed)
Lactic acid shown to Dr. Effie Shy

## 2012-12-25 NOTE — ED Provider Notes (Signed)
History     CSN: [id number]  Arrival date & time 12/25/12  4098   First MD Initiated Contact with Patient 12/25/12 [phone]      Chief Complaint  Patient presents with  . Urinary Retention    (Consider location/radiation/quality/duration/timing/severity/associated sxs/prior treatment) HPI Comments: 78 y.o. Female with PMHx of CHF, hypoxia, COPD, DM2 presents complaining of urinary retention since 7:30 last night when she last urinated. Pt admits some mild burning on urination, suprapubic pain, generalized weakness, shortness of breath (which is chronic for her. Pt is on home O2, 3L). Denies fever, nausea, vomiting, hematuria, constipation, diarrhea, chest pain, dizziness. Pt states she usually ambulates well at home, but has been feeling weak and unsteady on her feet since these sx began. Pt has been compliant with her meds, noting an increase in her diuretic over the last two week. Nothing makes her sx better or worse.   PMHx also includes admission 09/2012 for acute on chronic CHF and cardiac cath in 2011 which was negative for disease.  Pt lives with brother "who isn't much help." Granddaughter is at bedside.   PCP is [facility]   Past Medical History  Diagnosis Date  . Diabetes mellitus   . Hypertension   . Shingles   . GERD (gastroesophageal reflux disease)   . Abnormal heart rhythms   . COPD (chronic obstructive pulmonary disease)   . Thyroid disease     hypthyroidism  . Pulmonary hypertension, moderate to severe     No evidence of CAD on Cath 2011  . OSA on CPAP   . Obesity, morbid (more than 100 lbs over ideal weight or BMI > 40)   . Chronic respiratory failure with hypoxia 01/23/2012    Past Surgical History  Procedure Laterality Date  . Tubal ligation    . Ovary removed    . Knee arthroscopy      left    Family History  Problem Relation Age of Onset  . Heart attack Father 81  . Heart disease Mother 88    History    Substance Use Topics  . Smoking status: Never Smoker   . Smokeless tobacco: Current User    Types: Snuff  . Alcohol Use: No    OB History   Grav Para Term Preterm Abortions TAB SAB Ect Mult Living                  Review of Systems  Constitutional: Negative for fever, chills and diaphoresis.  HENT: Negative for neck pain and neck stiffness.   Eyes: Negative for visual disturbance.  Respiratory: Positive for shortness of breath. Negative for chest tightness.        Pt is on 3L home O2, chronically hypoxic   Cardiovascular: Negative for chest pain and palpitations.  Gastrointestinal: Negative for nausea, vomiting, diarrhea and constipation.  Genitourinary: Positive for dysuria and pelvic pain. Negative for frequency, flank pain and vaginal discharge.  Musculoskeletal: Positive for gait problem.       Pt usually ambulates well, but has been feeling weak and unsteady since last night  Skin: Negative for rash.  Neurological: Negative for dizziness, weakness, light-headedness, numbness and headaches.    Allergies  Diphenhydramine hcl; Aloe vera; and Penicillins  Home Medications   Current Outpatient Rx  Name  Route  Sig  Dispense  Refill  . ALPRAZolam (XANAX) 0.5 MG tablet   Oral   Take 0.5 mg by mouth 4 (four) times daily as needed  for anxiety.          Marland Kitchen aspirin EC 81 MG EC tablet   Oral   Take 1 tablet (81 mg total) by mouth daily.         . Brimonidine Tartrate (ALPHAGAN P OP)   Both Eyes   Place 1 drop into both eyes 2 (two) times daily.          . bumetanide (BUMEX) 2 MG tablet   Oral   Take 1 tablet (2 mg total) by mouth 2 (two) times daily.   60 tablet   5   . carvedilol (COREG) 3.125 MG tablet   Oral   Take 1 tablet (3.125 mg total) by mouth 2 (two) times daily with a meal.   60 tablet   5   . diltiazem (CARDIZEM CD) 180 MG 24 hr capsule   Oral   Take 1 capsule (180 mg total) by mouth daily.   30 capsule   5   . docusate sodium (COLACE) 50  MG capsule   Oral   Take 50 mg by mouth 2 (two) times daily.         Marland Kitchen ezetimibe (ZETIA) 10 MG tablet   Oral   Take 10 mg by mouth at bedtime.           . fexofenadine (ALLERGY RELIEF) 180 MG tablet   Oral   Take 180 mg by mouth daily.         . fish oil-omega-3 fatty acids 1000 MG capsule   Oral   Take 1 g by mouth 2 (two) times daily.           Marland Kitchen glipiZIDE (GLUCOTROL) 10 MG tablet   Oral   Take 10 mg by mouth 2 (two) times daily before a meal.           . lansoprazole (PREVACID) 30 MG capsule   Oral   Take 30 mg by mouth daily.           Marland Kitchen levothyroxine (SYNTHROID, LEVOTHROID) 150 MCG tablet   Oral   Take 150 mcg by mouth daily.         Marland Kitchen linagliptin (TRADJENTA) 5 MG TABS tablet   Oral   Take 5 mg by mouth daily.         Marland Kitchen losartan (COZAAR) 100 MG tablet   Oral   Take 1 tablet (100 mg total) by mouth daily.   30 tablet   5   . oxyCODONE-acetaminophen (PERCOCET/ROXICET) 5-325 MG per tablet   Oral   Take 1 tablet by mouth every 4 (four) hours as needed for pain.         . potassium chloride SA (K-DUR,KLOR-CON) 20 MEQ tablet   Oral   Take 1 tablet (20 mEq total) by mouth 2 (two) times daily.   60 tablet   5   . pravastatin (PRAVACHOL) 20 MG tablet   Oral   Take 20 mg by mouth daily.         . Travoprost, BAK Free, (TRAVATAMN) 0.004 % SOLN ophthalmic solution   Both Eyes   Place 1 drop into both eyes 2 (two) times daily.             BP 85/38  Pulse 99  Temp(Src) 98.1 F (36.7 C) (Oral)  Resp 20  SpO2 84%  Physical Exam  Nursing note and vitals reviewed. Constitutional: She is oriented to person, place, and time. She appears well-developed and well-nourished. No distress.  HENT:  Head: Normocephalic and atraumatic.  Eyes: Conjunctivae and EOM are normal.  Neck: Normal range of motion. Neck supple.  No meningeal signs  Cardiovascular: Normal rate, regular rhythm and normal heart sounds.  Exam reveals no gallop and no friction  rub.   No murmur heard. Pulmonary/Chest: Effort normal and breath sounds normal. No respiratory distress. She has no wheezes. She has no rales. She exhibits no tenderness.  Abdominal: Soft. Bowel sounds are normal. She exhibits no distension. There is tenderness in the suprapubic area. There is no rigidity, no rebound, no guarding and negative Murphy's sign.    Right sided CVA tenderness  Musculoskeletal: Normal range of motion. She exhibits edema. She exhibits no tenderness.  Normal strength in upper and lower extremities bilaterally including dorsiflexion and plantar flexion, strong and equal grip strength +2 edema lower extremities  Neurological: She is alert and oriented to person, place, and time. No cranial nerve deficit.  Speech is clear and goal oriented, follows commands Sensation normal to light touch and two point discrimination Moves extremities without ataxia, coordination intact  Skin: Skin is warm and dry. She is not diaphoretic. No erythema.  Psychiatric: She has a normal mood and affect.    ED Course  Procedures (including critical care time)  Labs Reviewed  URINALYSIS, ROUTINE W REFLEX MICROSCOPIC - Abnormal; Notable for the following:    Color, Urine AMBER (*)    APPearance TURBID (*)    Glucose, UA 100 (*)    Bilirubin Urine MODERATE (*)    Ketones, ur 15 (*)    All other components within normal limits  CBC WITH DIFFERENTIAL - Abnormal; Notable for the following:    WBC 14.2 (*)    RBC 5.13 (*)    Hemoglobin 15.3 (*)    Platelets 140 (*)    Neutrophils Relative 81 (*)    Neutro Abs 11.4 (*)    Lymphocytes Relative 10 (*)    Monocytes Absolute 1.3 (*)    All other components within normal limits  COMPREHENSIVE METABOLIC PANEL - Abnormal; Notable for the following:    Glucose, Bld 243 (*)    BUN 32 (*)    Creatinine, Ser 2.00 (*)    Albumin 3.4 (*)    AST 41 (*)    Alkaline Phosphatase 154 (*)    GFR calc non Af Amer 23 (*)    GFR calc Af Amer 27 (*)     All other components within normal limits  URINE MICROSCOPIC-ADD ON - Abnormal; Notable for the following:    Bacteria, UA FEW (*)    Crystals CA OXALATE CRYSTALS (*)    All other components within normal limits  URINE CULTURE  PRO B NATRIURETIC PEPTIDE  CG4 I-STAT (LACTIC ACID)   Dg Chest 2 View  12/25/2012  *RADIOLOGY REPORT*  Clinical Data: 78 year old female with shortness of breath, weakness, difficulty urinating.  CHEST - 2 VIEW  Comparison: 09/22/2012 and earlier.  Findings: Stable lung volumes. Stable cardiomegaly and mediastinal contours.  Pulmonary vascular congestion with prominent central pulmonary vasculature is stable.  Linear areas of scarring atelectasis greater in the left lung also appears stable.  No pneumothorax.  No pleural effusion or consolidation. Visualized tracheal air column is within normal limits.  Stable visualized osseous structures.  IMPRESSION: Pulmonary vascular congestion stable to mildly increased such that mild interstitial edema may be present.  Otherwise stable chest with cardiomegaly and atelectasis.   Original Report Authenticated By: Erskine Speed, M.D.  Medications  sodium chloride 0.9 % bolus 1,000 mL (0 mLs Intravenous Stopped 12/25/12 0830)  sodium chloride 0.9 % bolus 1,000 mL (0 mLs Intravenous Stopped 12/25/12 1000)  sodium chloride 0.9 % bolus 1,000 mL (1,000 mLs Intravenous New Bag/Given 12/25/12 1025)  cefTRIAXone (ROCEPHIN) injection 250 mg (250 mg Intramuscular Given 12/25/12 1153)  lidocaine (PF) (XYLOCAINE) 1 % injection (2 mLs  Given 12/25/12 1153)     1. Volume depletion   2. Hypotension   3. Renal insufficiency       MDM  Pt presents with chronic hypoxia, SOB, and recent onset urinary rentention, weakness, suprapubic pain, and dysuria. Will check for UTI, but must consider sepsis. Will get lactic acid in addition to basic labs. CXR d/t hx of CHF.   Lactic acid results WNL.   7:40 AM Nurse reports approx drained from cath. No  urinary retension.   8:41 AM Pt in NAD, no pain. Finished 1L with improved BP, but will give another liter.   11:44 AM Pt shows slight improvement in BP after third liter (90s/50s) but still significantly volume depleted likely related to an increase in her diuretic (Bumex) about 2 weeks ago (from 2mg  to 3mg  per day as per pt). Pt also complaining of mild dysuria, though UA returned negative for UTI will culture and treat with Rocephin in ED. Labs show bump in her BUN and Cr (from 26 to 32, from 1.2 to 2.) a mild white count (14) . Normal heart rate and borderline oxygen saturation. Sats drop to 88% when she talks and improves to 93% with deep breathing. She's on home O2 at 3L. On 4L here.  Will consult with Internal Medicine Unassigned (not [facility]) for Admission.   12:19 PM Consult call appreciated. Telemetry recommended for admit.    Glade Nurse, PA-C 12/25/12 1222

## 2012-12-25 NOTE — ED Notes (Signed)
Pt became very winded during orthostatic vs. o2 increased to 6L. Pt now sitting up comfortably in bed.

## 2012-12-25 NOTE — ED Notes (Signed)
Family at bedside. 

## 2012-12-25 NOTE — ED Notes (Signed)
Admitting MD at bedside.

## 2012-12-25 NOTE — ED Notes (Signed)
Pt given water 

## 2012-12-25 NOTE — ED Notes (Signed)
Size 16# foley cath placed , pt tolerated well- approx drained- specimen obtained and then foley cath removed due to fact pt not in urinary retention .

## 2012-12-25 NOTE — Consult Note (Addendum)
Reason for Consult: A/C CHF with positive troponin Referring Physician: TRH   HPI: The patient is a 78 y/o, morbidly obese female with a h/o right heart failure, pulmonary HTN and OSA, being admitted by [facility] for symptomatic hypotension, secondary to volume depletion, which the patient is attributing to both recent severe diarrhea x 3 days, as well as an increase in her diuretic dose about 2 weeks ago. She is followed at [facility] by Dr. Rennis Golden. She was recently hospitalized in February of this year for acute on chronic CHF. BNP during that admission was over 5,000. She was diuresed aggressively down to about 216 pounds. She recently saw Dr. Rennis Golden in clinic for post-hospital follow-up on 12/15/12. At that visit, her weight had increased to 221 pounds and she had noted worsening shortness of breath. It was decided to increase her Bumex from 2 mg to 3 mg BID. The patient reported to [facility] earlier this morning with complaints of urinary retention x 1 day as wells generalized malaise and weakness. In the ER, she was found to be hypotensive w/ SBPs in the 80s. She denied syncope/presyncope and chest pain. She endorsed a mild increased SOB. Work-up revealed that she was dehydrated. She received 3L of IVFs. Her SBP has improved slightly into the 90s. She continues to endorse generalized weakness, but otherwise has no further complaints. Her diarrhea has resolved.    Past Medical History  Diagnosis Date  . Diabetes mellitus   . Hypertension   . Shingles   . GERD (gastroesophageal reflux disease)   . Abnormal heart rhythms   . COPD (chronic obstructive pulmonary disease)     on 3 L continuous   . Thyroid disease     hypthyroidism  . Pulmonary hypertension, moderate to severe     No evidence of CAD on Cath 2011  . OSA on CPAP     not compliant with cpap  . Obesity, morbid (more than 100 lbs over ideal weight or BMI > 40)   . Chronic respiratory failure with hypoxia 01/23/2012  . Glaucoma   . CHF (congestive heart  failure)     cardiologist Dr. Rennis Golden    Past Surgical History  Procedure Laterality Date  . Tubal ligation    . Ovary removed    . Knee arthroscopy      left    Family History  Problem Relation Age of Onset  . Heart attack Father 67  . Heart disease Mother 57  . Lymphoma Daughter   . Diabetes Mother   . Diabetes      siblings, son  . Coronary artery disease Brother   . Stroke Brother     Social History:  reports that she has never smoked. Her smokeless tobacco use includes Snuff. She reports that she does not drink alcohol or use illicit drugs.  Allergies:  Allergies  Allergen Reactions  . Diphenhydramine Hcl Other (See Comments)    Glaucoma prevents patient from taking this medication   . Aloe Vera Rash  . Penicillins Swelling and Rash    Medications: Prior to Admission:  (Not in a hospital admission)  Results for orders placed during the hospital encounter of 12/25/12 (from the past 48 hour(s))  URINALYSIS, ROUTINE W REFLEX MICROSCOPIC     Status: Abnormal   Collection Time    12/25/12  7:25 AM      Result Value Range   Color, Urine AMBER (*) YELLOW   Comment: BIOCHEMICALS MAY BE AFFECTED BY COLOR   APPearance  TURBID (*) CLEAR   Specific Gravity, Urine 1.023  1.005 - 1.030   pH 5.0  5.0 - 8.0   Glucose, UA 100 (*) NEGATIVE mg/dL   Hgb urine dipstick NEGATIVE  NEGATIVE   Bilirubin Urine MODERATE (*) NEGATIVE   Ketones, ur 15 (*) NEGATIVE mg/dL   Protein, ur NEGATIVE  NEGATIVE mg/dL   Urobilinogen, UA 1.0  0.0 - 1.0 mg/dL   Nitrite NEGATIVE  NEGATIVE   Leukocytes, UA NEGATIVE  NEGATIVE  URINE MICROSCOPIC-ADD ON     Status: Abnormal   Collection Time    12/25/12  7:25 AM      Result Value Range   Squamous Epithelial / LPF RARE  RARE   Bacteria, UA FEW (*) RARE   Crystals CA OXALATE CRYSTALS (*) NEGATIVE   Urine-Other AMORPHOUS URATES/PHOSPHATES     Comment: LESS THAN 10 mL OF URINE SUBMITTED  CBC WITH DIFFERENTIAL     Status: Abnormal   Collection Time     12/25/12  7:42 AM      Result Value Range   WBC 14.2 (*) 4.0 - 10.5 K/uL   RBC 5.13 (*) 3.87 - 5.11 MIL/uL   Hemoglobin 15.3 (*) 12.0 - 15.0 g/dL   HCT 16.1  09.6 - 04.5 %   MCV 88.1  78.0 - 100.0 fL   MCH 29.8  26.0 - 34.0 pg   MCHC 33.8  30.0 - 36.0 g/dL   RDW 40.9  81.1 - 91.4 %   Platelets 140 (*) 150 - 400 K/uL   Neutrophils Relative 81 (*) 43 - 77 %   Neutro Abs 11.4 (*) 1.7 - 7.7 K/uL   Lymphocytes Relative 10 (*) 12 - 46 %   Lymphs Abs 1.4  0.7 - 4.0 K/uL   Monocytes Relative 9  3 - 12 %   Monocytes Absolute 1.3 (*) 0.1 - 1.0 K/uL   Eosinophils Relative 0  0 - 5 %   Eosinophils Absolute 0.0  0.0 - 0.7 K/uL   Basophils Relative 0  0 - 1 %   Basophils Absolute 0.0  0.0 - 0.1 K/uL  COMPREHENSIVE METABOLIC PANEL     Status: Abnormal   Collection Time    12/25/12  7:42 AM      Result Value Range   Sodium 135  135 - 145 mEq/L   Potassium 5.1  3.5 - 5.1 mEq/L   Chloride 96  96 - 112 mEq/L   CO2 25  19 - 32 mEq/L   Glucose, Bld 243 (*) 70 - 99 mg/dL   BUN 32 (*) 6 - 23 mg/dL   Creatinine, Ser 7.82 (*) 0.50 - 1.10 mg/dL   Calcium 9.5  8.4 - 95.6 mg/dL   Total Protein 6.5  6.0 - 8.3 g/dL   Albumin 3.4 (*) 3.5 - 5.2 g/dL   AST 41 (*) 0 - 37 U/L   ALT 28  0 - 35 U/L   Alkaline Phosphatase 154 (*) 39 - 117 U/L   Total Bilirubin 0.7  0.3 - 1.2 mg/dL   GFR calc non Af Amer 23 (*) >90 mL/min   GFR calc Af Amer 27 (*) >90 mL/min   Comment:            The eGFR has been calculated     using the CKD EPI equation.     This calculation has not been     validated in all clinical     situations.     eGFR's  persistently     <90 mL/min signify     possible Chronic Kidney Disease.  CG4 I-STAT (LACTIC ACID)     Status: None   Collection Time    12/25/12  7:50 AM      Result Value Range   Lactic Acid, Venous 1.99  0.5 - 2.2 mmol/L  TROPONIN I     Status: Abnormal   Collection Time    12/25/12  2:05 PM      Result Value Range   Troponin I 0.84 (*) <0.30 ng/mL   Comment:             Due to the release kinetics of cTnI,     a negative result within the first hours     of the onset of symptoms does not rule out     myocardial infarction with certainty.     If myocardial infarction is still suspected,     repeat the test at appropriate intervals.     CRITICAL RESULT CALLED TO, READ BACK BY AND VERIFIED WITH:     RN C. ROUGHGARDEN 1520 12/25/12 Riki Rusk.    Dg Chest 2 View  12/25/2012  *RADIOLOGY REPORT*  Clinical Data: 78 year old female with shortness of breath, weakness, difficulty urinating.  CHEST - 2 VIEW  Comparison: 09/22/2012 and earlier.  Findings: Stable lung volumes. Stable cardiomegaly and mediastinal contours.  Pulmonary vascular congestion with prominent central pulmonary vasculature is stable.  Linear areas of scarring atelectasis greater in the left lung also appears stable.  No pneumothorax.  No pleural effusion or consolidation. Visualized tracheal air column is within normal limits.  Stable visualized osseous structures.  IMPRESSION: Pulmonary vascular congestion stable to mildly increased such that mild interstitial edema may be present.  Otherwise stable chest with cardiomegaly and atelectasis.   Original Report Authenticated By: Erskine Speed, M.D.     Review of Systems  Constitutional: Positive for malaise/fatigue.  Respiratory: Positive for shortness of breath.   Cardiovascular: Negative for chest pain, palpitations, orthopnea, leg swelling and PND.  Gastrointestinal: Positive for diarrhea. Negative for nausea, vomiting, blood in stool and melena.  Genitourinary: Negative for dysuria and hematuria.  Neurological: Positive for weakness. Negative for dizziness and loss of consciousness.   Blood pressure 96/59, pulse 98, temperature 99 F (37.2 C), temperature source Rectal, resp. rate 22, weight 225 lb (102.059 kg), SpO2 92.00%. Physical Exam  Constitutional: She is oriented to person, place, and time. She appears well-developed and well-nourished.  No distress.  Obese   HENT:  Head: Normocephalic and atraumatic.  Eyes: Conjunctivae and EOM are normal. Pupils are equal, round, and reactive to light.  Neck: Normal range of motion. Neck supple.  Cardiovascular: Normal rate, regular rhythm and normal heart sounds.  Exam reveals no gallop and no friction rub.   No murmur heard. Pulses:      Radial pulses are 2+ on the right side, and 2+ on the left side.  Respiratory: Effort normal. No respiratory distress. She has no wheezes. She has rales (minimal in LLL).  GI: Soft. Bowel sounds are normal. She exhibits no distension and no mass. There is no tenderness.  Musculoskeletal: She exhibits no edema.  Lymphadenopathy:    She has no cervical adenopathy.  Neurological: She is alert and oriented to person, place, and time.  Skin: Skin is warm and dry. She is not diaphoretic.  Psychiatric: She has a normal mood and affect. Her behavior is normal.    Assessment/Plan: Principal Problem:  Hypotension Active Problems:   Chronic respiratory failure with hypoxia   DM2 (diabetes mellitus, type 2)   Unspecified hypothyroidism   Obesity, morbid (more than 100 lbs over ideal weight or BMI > 40)   Pulmonary hypertension, moderate to severe; Severe by Echo 09/2012   Diastolic CHF, chronic   UTI (urinary tract infection)  Plan: SBP mildly improved into the 90s. She reports weakness but is otherwise asymptomatic. Hold diuretic as well as antihypertensives. Also hold Losartan due to SCr. Of 2.0.  She denies CP. Troponin is mildly elevated at 0.84. Pt was recently admitted for a/c CHF in Feb. and also had elevated enzymes. This was felt to be related to CHF exacerbation and not due to CAD. The patient was cathed in 2011 and had completely normal coronaries, so it is unlikely that positive troponin is due to ischemia. The patient's EKG is normal. Monitor for chest pain. We will continue to follow.     SIMMONS, BRITTAINY 12/25/2012, 4:15 PM    Patient  seen and examined. Agree with assessment and plan. Very pleasant 78 yo WF with history of obesity, OSA, pulmonary htn who now presents following 3 days of diarrhea. She had continued to take her cardiac meds including diuretic at recently increased dose and ARB. She now is felt to be intravascular volume depleted. Agree with holding losartan, bumex, cardizem and coreg, but would resume coreg at lower dose tomorrow if BP stable so as to reduce potential for withdrawal induced tachycardia. Doubt ischemia etiology to troponin. Will follow.   Lennette Bihari, MD, [facility] 12/25/2012 5:21 PM

## 2012-12-25 NOTE — ED Notes (Signed)
Pericare performed . Sacral area intact .

## 2012-12-25 NOTE — ED Notes (Signed)
Wt 225 LBS

## 2012-12-25 NOTE — ED Notes (Signed)
Ordered pt diabetic diet tray.

## 2012-12-25 NOTE — ED Notes (Signed)
MD at bedside. 

## 2012-12-25 NOTE — Progress Notes (Signed)
Received report on pt, called rapid response to assess pt due to symptoms of sepsis.  Was notified that pt was already on their way to floor.  Pt admitted to room 4707, O2 sats originally 83% on 4.5L, rapid response nurse came to help assess.  Turned pt up to 5L, o2 sats now stable at 90-93%.  Will continue to monitor pt.

## 2012-12-25 NOTE — H&P (Signed)
Hospital Admission Note Date: 12/25/2012  Patient name: [name] Medical record number: [id number] Date of birth: [birth year] Age: 78 y.o. Gender: female PCP: Cassell Smiles., MD [facility])  Medical Service: Internal Medicine  Attending physician: Dr. Eben Burow     1st Contact: Dr. Shirlee Latch [phone]  2nd Contact: Dr. Dierdre Searles Pager:[phone] After 5 pm or weekends: 1st Contact: Pager: [phone] 2nd Contact: Pager: [phone]  Chief Complaint: Decreased urination  History of Present Illness: 78 y.o with significant cardiac and pulmonary medical history presented for decreased urination x 24 hours.  She has had decreased urination despite having increased Bumex from 2 mg bid to 3 mg bid recently.  She also reports her weight has increased recently (dry weight ~213 per patient) associated with sob with exertion, abdominal distension (though abdominal distension improved within the last 2 weeks).  Shortness of breath has been chronic x at least 2 years though worsening x 2 weeks and she can only walk a few steps before feeling short of breath.  At home she is on home O2 3L continuously.  She was found to be hypotensive on admission.  Of note she had recent resolution of multiple bouts of diarrhea for 3 days.   Diarrhea resolved 12/24/12. Diarrhea was associated with nausea which is also resolved.  Other review of systems positive cold chills,  dysuria noted 1 day prior to admission, abdominal pain (resolved, previously suprapubic and 9/10 without radiation no 0/10), mild epigastric pain x years, gait problem (supposed to walk with a walker), resolved lightheadedness (noted last night and this morning).  She denies sick contacts.   Meds: Prescriptions prior to admission  Medication Sig Dispense Refill  . ALPRAZolam (XANAX) 0.5 MG tablet Take 0.5 mg by mouth 4 (four) times daily as needed for anxiety.       Marland Kitchen aspirin 81 MG EC tablet Take 81 mg by mouth daily.      . Brimonidine Tartrate (ALPHAGAN P  OP) Place 1 drop into both eyes 2 (two) times daily.       . bumetanide (BUMEX) 2 MG tablet Take 2 mg by mouth 2 (two) times daily.      . carvedilol (COREG) 3.125 MG tablet Take 3.125 mg by mouth 2 (two) times daily with a meal.      . diltiazem (CARDIZEM CD) 180 MG 24 hr capsule Take 180 mg by mouth daily.      Marland Kitchen docusate sodium (COLACE) 50 MG capsule Take 50 mg by mouth 2 (two) times daily.      Marland Kitchen ezetimibe (ZETIA) 10 MG tablet Take 10 mg by mouth at bedtime.        . fexofenadine (ALLERGY RELIEF) 180 MG tablet Take 180 mg by mouth daily.      . fish oil-omega-3 fatty acids 1000 MG capsule Take 1 g by mouth 2 (two) times daily.        Marland Kitchen glipiZIDE (GLUCOTROL) 10 MG tablet Take 10 mg by mouth 2 (two) times daily before a meal.        . lansoprazole (PREVACID) 30 MG capsule Take 30 mg by mouth daily.        Marland Kitchen levothyroxine (SYNTHROID, LEVOTHROID) 150 MCG tablet Take 150 mcg by mouth daily.      Marland Kitchen linagliptin (TRADJENTA) 5 MG TABS tablet Take 5 mg by mouth daily.      Marland Kitchen losartan (COZAAR) 100 MG tablet Take 100 mg by mouth daily.      Marland Kitchen oxyCODONE-acetaminophen (PERCOCET/ROXICET) 5-325 MG  per tablet Take 1 tablet by mouth every 4 (four) hours as needed for pain.      . potassium chloride SA (K-DUR,KLOR-CON) 20 MEQ tablet Take 20 mEq by mouth 2 (two) times daily.      . pravastatin (PRAVACHOL) 20 MG tablet Take 20 mg by mouth at bedtime.       . Travoprost, BAK Free, (TRAVATAMN) 0.004 % SOLN ophthalmic solution Place 1 drop into both eyes 2 (two) times daily.         Allergies: Allergies as of 12/25/2012 - Review Complete 12/25/2012  Allergen Reaction Noted  . Diphenhydramine hcl Other (See Comments) 03/29/2011  . Aloe vera Rash 03/29/2011  . Penicillins Swelling and Rash 03/29/2011   Past Medical History  Diagnosis Date  . Diabetes mellitus   . Hypertension   . Shingles   . GERD (gastroesophageal reflux disease)   . Abnormal heart rhythms   . COPD (chronic obstructive pulmonary disease)      on 3 L continuous   . Thyroid disease     hypthyroidism  . Pulmonary hypertension, moderate to severe     No evidence of CAD on Cath 2011  . OSA on CPAP     not compliant with cpap  . Obesity, morbid (more than 100 lbs over ideal weight or BMI > 40)   . Chronic respiratory failure with hypoxia 01/23/2012  . Glaucoma   . CHF (congestive heart failure)     cardiologist Dr. Rennis Golden   Past Surgical History  Procedure Laterality Date  . Tubal ligation    . Ovary removed    . Knee arthroscopy      left   Family History  Problem Relation Age of Onset  . Heart attack Father 34  . Heart disease Mother 74  . Lymphoma Daughter   . Diabetes Mother   . Diabetes      siblings, son  . Coronary artery disease Brother   . Stroke Brother    History   Social History  . Marital Status: Widowed    Spouse Name: N/A    Number of Children: N/A  . Years of Education: N/A   Occupational History  . Not on file.   Social History Main Topics  . Smoking status: Never Smoker   . Smokeless tobacco: Current User    Types: Snuff  . Alcohol Use: No  . Drug Use: No  . Sexually Active: Not on file   Other Topics Concern  . Not on file   Social History Narrative   8 th grade education   Previous job: farmer    Lives with brother   Denies cigarettes, etOH, drugs. Uses snuff since age 50     Review of Systems: General: +cold chills, denies fever, appetite normal, weight increase (dry weight ~213 per patient weight 5/1 was 219 and weight on admission 5/2 was 225) HEENT: denies dysphagia Cardiac: denies chest pain, heartburn  Pulm: +sob with exertion (chronic x at least 2 years though worsening x 2 weeks, on home O2 3L), denies orthopnea Abd/GU: +dysuria noted 1 day prior to admission), +decreased urination x 24 hours prior to admission, abdominal pain (resolved, previously suprapubic and 9/10 without radiation), mild epigastric pain x years, +abdominal distension (though improved within 2  weeks); denies blood in urine or stool, resolved diarrhea (noted x 3 days with multiple bouts of diarrhea and nausea now both resolved and stopped 12/24/12), denies constipation, denies vomiting Ext: denies swelling in lower extremities  Neuro: +gait problem (supposed to walk with a walker), resolved lightheadedness (noted last night and this morning) Other: denies sick contacts    Physical Exam: VS 95, 85-90 on 4L O2, 23, 89/49 (59)-->101/62 (69) (per patient baseline sbp 120s) Blood pressure 107/56, pulse 93, temperature 98.4 F (36.9 C), temperature source Rectal, resp. rate 19, height 5\' 1"  (1.549 m), weight 227 lb 8.2 oz (103.2 kg), SpO2 88.00%. Vitals reviewed. General: resting in bed, NAD, alert and oriented x 3  HEENT: PERRL b/l, no scleral icterus, chewing tobacco in mouth Cardiac: RRR, no rubs, murmurs or gallops Pulm: fine crackles b/l Abd: soft, obese, mild ttp epigastric region, nondistended, BS present Ext: cool to touch feet (chronic issue), 1+ edema, intact distal pulses  Neuro: alert and oriented X3, moving all 4 extremities, neurologically intact Skin: intact    Lab results: Basic Metabolic Panel:  Recent Labs  16/10/96 0742 12/25/12 1405  NA 135  --   K 5.1  --   CL 96  --   CO2 25  --   GLUCOSE 243*  --   BUN 32*  --   CREATININE 2.00*  --   CALCIUM 9.5  --   MG  --  1.7   Liver Function Tests:  Recent Labs  12/25/12 0742  AST 41*  ALT 28  ALKPHOS 154*  BILITOT 0.7  PROT 6.5  ALBUMIN 3.4*   CBC:  Recent Labs  12/25/12 0742  WBC 14.2*  NEUTROABS 11.4*  HGB 15.3*  HCT 45.2  MCV 88.1  PLT 140*   Cardiac Enzymes:  Recent Labs  12/25/12 1405  TROPONINI 0.84*   Results for KELENA, GARROW (MRN [id number]) as of 12/25/2012 19:04  Ref. Range 10/20/2012 05:00  Pro B Natriuretic peptide (BNP) Latest Range: 0-450 pg/mL 2407.0 (H)    CBG: No results found for this basename: GLUCAP,  in the last 72 hours Hemoglobin A1C: No results found  for this basename: HGBA1C,  in the last 72 hours Fasting Lipid Panel: No results found for this basename: CHOL, HDL, LDLCALC, TRIG, CHOLHDL, LDLDIRECT,  in the last 72 hours  Urinalysis:  Recent Labs  12/25/12 0725  COLORURINE AMBER*  LABSPEC 1.023  PHURINE 5.0  GLUCOSEU 100*  HGBUR NEGATIVE  BILIRUBINUR MODERATE*  KETONESUR 15*  PROTEINUR NEGATIVE  UROBILINOGEN 1.0  NITRITE NEGATIVE  LEUKOCYTESUR NEGATIVE   Misc. Labs: Tropp Urine lytes   Imaging results:  Dg Chest 2 View  12/25/2012  *RADIOLOGY REPORT*  Clinical Data: 78 year old female with shortness of breath, weakness, difficulty urinating.  CHEST - 2 VIEW  Comparison: 09/22/2012 and earlier.  Findings: Stable lung volumes. Stable cardiomegaly and mediastinal contours.  Pulmonary vascular congestion with prominent central pulmonary vasculature is stable.  Linear areas of scarring atelectasis greater in the left lung also appears stable.  No pneumothorax.  No pleural effusion or consolidation. Visualized tracheal air column is within normal limits.  Stable visualized osseous structures.  IMPRESSION: Pulmonary vascular congestion stable to mildly increased such that mild interstitial edema may be present.  Otherwise stable chest with cardiomegaly and atelectasis.   Original Report Authenticated By: Erskine Speed, M.D.     Other results: EKG: NSR, rate 100, borderline RAD, no LVH, poor R wave progression, normal intervals, T wave inversions V1, 3, avF, V3, no acute ST changes   10/14/12 echo Study Conclusions  - Left ventricle: The cavity size was normal. Wall thickness was increased in a pattern of moderate LVH. There was moderate concentric  hypertrophy. Systolic function was normal. The estimated ejection fraction was in the range of 55% to 65%. Although no diagnostic regional wall motion abnormality was identified, this possibility cannot be completely excluded on the basis of this study. Doppler parameters are consistent  with abnormal left ventricular relaxation (grade 1 diastolic dysfunction). - Ventricular septum: These changes are consistent with RV volume and pressure overload. - Aortic valve: Mildly calcified annulus. Trileaflet; normal thickness, mildly calcified leaflets. - Mitral valve: Calcified annulus. Mildly thickened leaflets . - Right ventricle: The cavity size was severely dilated. Wall thickness was normal. Systolic function was mildly reduced. The estimated ejection fraction was , by visual assessment. - Right atrium: The atrium was moderately to severely dilated. - Pulmonary arteries: PA peak pressure: 78mm Hg (S). Impressions:  - Consistent with cor pulmonale, with elevated right-sided filling pressure and normal left-sided filling pressure. Pulmonary hypertension, with mild RV failure, due to primary pulmonary hypertension.   Assessment & Plan by Problem: 78 y.o with significant medical history presents with urinary retention, hypotension found to have AKI and elevated troponin.    1. Orthostatic hypotension with history of HTN -BP 85/38 on admission.  Likely multifactorial due to volume depletion (from 3 days of diarrhea) and recent increased in dosage of diuretics  -Bolused 3L NS in the ED. Try to avoid further fluids due to history of heart failure and pulm. HTN -Holding Losartan, Bumex, Cardiazem, Coreg but will resume Coreg at lower dose 12/26/12 if BP is stable -lying 81/47 (HR 94) lying, 66/37 (HR 97 sitting), 96/59 (HR 98) standing, Orthostatics q shift   2. Acute on CKD -Cr. 2.00 on admission. Baseline ~1.0-1.3. Likely prerenal due to volume depletion from diuretics and diarrhea -pending Urine lytes  -trend BMET  3. Dyspnea on breath with exertion -Etiology unclear.  Could multifactorial be cardiac or pulmonary as patient has chronic CHF due to right heart failure secondary to pulmonary HTN, obesity, COPD.  -Will trend cardiac enzymes.  Strict i/o, pending BNP, daily  weight (weight on admission was 225 lbs). Dry weight is ~213 per patient  4. Elevated troponin -0.84 on admission.  Etiology could be secondary to age, AKI, chronic right heart failure due to pulmonary HTN.  Cards thought less likely due to ischemia.  She has had elevated troponins in 09/2012 0.4-0.8 during an admission for heart failure -consulted cardiology and they saw the patient (Dr. Tresa Endo).  Agree with holding Losartan, Bumex, Cardiazem, Coreg but recommend resuming Coreg at lower dose 12/26/12 if BP is stable -Will trend cardiac enzymes    5. Chronic congestive heart failure (right heart failure secondary to pulmonary HTN) -Normal cardiac cath 2011.  Last admission was 09/2012.  Her cardiologist is D.r Rennis Golden who she recent ~2 weeks ago and her Bumex dose was increased from 2 mg bid to 3 mg bid. Per patient dry weight is 213.  Weight 5/1 was 219 and weight on admission was 225 pounds  -Troponin 0.84 12/25/12 but she has had elevated trop in the past 0.4-0.8.  ProBNP 2407.0 on admission with most recent BNP 2160 09/2012.   -Hold Bumex tonight. Resume Bumex 3 mg bid in am.  If sob overnight resume Bumex -[facility] cardiology [facility] [phone] for consult they agree with holding Losartan, Bumex, Cardiazem, Coreg but recommend resuming Coreg at lower dose 12/26/12 if BP is stable -daily weights, strict intake and output   6. Urinary retention -i/o cath in the ED with 50 mL fluid.  Voiding since  i/o cath -monitor i/o -prn bladder scan if needed   7. Leukocytosis  -WBC 14.2.  Could be due to acute stress will r/o infection  -UA amber, turbid, 100 glucose, moderate biluribin, 15 ketones, few bacteria, Ca oxylate crystals  -Trend CBC, pending urine culture   8. Complains of dysuria R/o UTI -Given Rocephin in the Ed  -Pending urine culture to follow  -Will treat with Cipro 250 mg q 18 hours   9. COPD on 3 L O2 continous  -No acute issues   10. Pulmonary HTN  (moderate to severe)  -Likely due to OSA noncompliant with cpap at home   11. History of dyslipidemia  -Zetia 10 mg qhs, zocor 10 mg qhs   12. DM 2 (8.6% 09/2012) -Monitor cbg, SSI. Lantus 5 units.  D/c oral hypoglycemics home meds  13. Hypothyroidism  -Continue home Synthyroid 150 mcg qd   14. DVT px  -Heparin sq   15. F/E/N -Try to avoid IVF -Will monitor and trend BMET and replace prn  -Carb mod diet    Dispo: Disposition is deferred at this time, awaiting improvement of current medical problems. Anticipated discharge in approximately 2-3  day(s).   The patient does have a current PCP (FUSCO,LAWRENCE J., MD), therefore will be requiring OPC follow-up after discharge.   The patient does not have transportation limitations that hinder transportation to clinic appointments.  SignedAnnett Gula [phone] 12/25/2012, 8:03 PM

## 2012-12-25 NOTE — ED Provider Notes (Signed)
[name] is a 78 y.o. female  who presents for weakness, and decreased voiding. She has been held for 2 days. She has mild dysuria. She is using her usual medications, without relief. She had her diuretic increased about 2 weeks ago.  Exam obese, alert, cooperative. Vital sign trending; persistent hypotension with normal heart rate and borderline oxygen saturation. Saturation drops to 88% and improves to 93% with deep breathing. Abdomen soft and nontender. Respiratory no wheezing. Neurologic nonfocal. She is alert and oriented  Assessment hypotension in hypertensive patient with renal insufficiency, likely related to volume depletion. I suspect this is multifactorial. She may have a urinary tract infection. We'll start Rocephin here. She will require admission for stabilization.    Medical screening examination/treatment/procedure(s) were conducted as a shared visit with non-physician practitioner(s) and myself.  I personally evaluated the patient during the encounter  Flint Melter, MD 12/25/12 1859

## 2012-12-25 NOTE — ED Notes (Signed)
Md  informed of results of cath.

## 2012-12-25 NOTE — ED Notes (Signed)
Eme made aware of pt's tropin being 0.87

## 2012-12-26 LAB — BASIC METABOLIC PANEL
BUN: 33 mg/dL — ABNORMAL HIGH (ref 6–23)
Calcium: 9 mg/dL (ref 8.4–10.5)
GFR calc Af Amer: 38 mL/min — ABNORMAL LOW (ref >90)
GFR calc non Af Amer: 33 mL/min — ABNORMAL LOW (ref >90)
Potassium: 4.5 mEq/L (ref 3.5–5.1)
Sodium: 129 mEq/L — ABNORMAL LOW (ref 135–145)

## 2012-12-26 LAB — GLUCOSE, CAPILLARY
Glucose-Capillary: 137 mg/dL — ABNORMAL HIGH (ref 70–99)
Glucose-Capillary: 184 mg/dL — ABNORMAL HIGH (ref 70–99)

## 2012-12-26 LAB — CBC
HCT: 40.6 % (ref 36.0–46.0)
MCV: 86.6 fL (ref 78.0–100.0)
RDW: 14.7 % (ref 11.5–15.5)

## 2012-12-26 LAB — UREA NITROGEN, URINE: Urea Nitrogen, Ur: 558 mg/dL

## 2012-12-26 LAB — URINE CULTURE

## 2012-12-26 MED ORDER — LOSARTAN POTASSIUM 25 MG PO TABS
25.0000 mg | ORAL_TABLET | Freq: Every day | ORAL | Status: DC
  Administered 2012-12-26: 25 mg via ORAL
  Filled 2012-12-26 (×2): qty 1

## 2012-12-26 MED ORDER — CARVEDILOL 3.125 MG PO TABS
3.1250 mg | ORAL_TABLET | Freq: Two times a day (BID) | ORAL | Status: DC
  Administered 2012-12-26 – 2012-12-27 (×2): 3.125 mg via ORAL
  Filled 2012-12-26 (×4): qty 1

## 2012-12-26 MED ORDER — ACETAMINOPHEN 325 MG PO TABS: 650.0000 mg | ORAL_TABLET | Freq: Four times a day (QID) | ORAL | Status: DC | PRN

## 2012-12-26 MED ORDER — NON FORMULARY: 40.0000 mg | Freq: Every day | Status: DC

## 2012-12-26 MED ORDER — PANTOPRAZOLE SODIUM 40 MG PO TBEC: 40.0000 mg | DELAYED_RELEASE_TABLET | Freq: Every day | ORAL | Status: DC

## 2012-12-26 MED ORDER — OMEPRAZOLE 20 MG PO CPDR
20.0000 mg | DELAYED_RELEASE_CAPSULE | Freq: Every morning | ORAL | Status: DC
  Administered 2012-12-26 – 2012-12-28 (×3): 20 mg via ORAL
  Filled 2012-12-26 (×3): qty 1

## 2012-12-26 MED ORDER — LANSOPRAZOLE 30 MG PO CPDR
30.0000 mg | DELAYED_RELEASE_CAPSULE | Freq: Every day | ORAL | Status: DC
  Filled 2012-12-26: qty 1

## 2012-12-26 NOTE — Progress Notes (Signed)
[facility] Progress Note  Subjective:  Feels better; no recurrent diarrhea. No SOB.  Objective:   Vital Signs in the last 24 hours: Temp:  [97.6 F (36.4 C)-98.5 F (36.9 C)] 97.6 F (36.4 C) (05/03 0600) Pulse Rate:  [90-101] 101 (05/03 0649) Resp:  [15-23] 20 (05/03 0649) BP: (66-123)/(37-63) 109/57 mmHg (05/03 0649) SpO2:  [85 %-93 %] 91 % (05/03 0649) Weight:  [102.059 kg (225 lb)-103.511 kg (228 lb 3.2 oz)] 103.511 kg (228 lb 3.2 oz) (05/03 1610)  Intake/Output from previous day: 05/02 0701 - 05/03 0700 In: 120 [P.O.:120] Out: 550 [Urine:550]  Scheduled: . aspirin  81 mg Oral Daily  . brimonidine  1 drop Both Eyes BID  . ciprofloxacin  250 mg Oral Q18H  . ezetimibe  10 mg Oral QHS  . heparin  5,000 Units Subcutaneous Q8H  . insulin aspart  0-5 Units Subcutaneous QHS  . insulin aspart  0-9 Units Subcutaneous TID WC  . insulin glargine  5 Units Subcutaneous QHS  . levothyroxine  150 mcg Oral QAC breakfast  . loratadine  10 mg Oral Daily  . simvastatin  10 mg Oral q1800  . sodium chloride  3 mL Intravenous Q12H  . sodium chloride  3 mL Intravenous Q12H  . Travoprost (BAK Free)  1 drop Both Eyes BID     Physical Exam:   General appearance: alert, cooperative and no distress Neck: no adenopathy, no carotid bruit, no JVD and supple, symmetrical, trachea midline Lungs: clear to auscultation bilaterally Heart: regular rate and rhythm Abdomen: soft, non-tender; bowel sounds normal; no masses,  no organomegaly Extremities: no edema, redness or tenderness in the calves or thighs   Rate: 102  Rhythm: sinus tachycardia  Lab Results:    Recent Labs  12/25/12 0742 12/26/12 0117  NA 135 129*  K 5.1 4.5  CL 96 97  CO2 25 22  GLUCOSE 243* 173*  BUN 32* 33*  CREATININE 2.00* 1.49*    Recent Labs  12/25/12 1906 12/26/12 0117  TROPONINI 0.99* 0.78*   Hepatic Function Panel  Recent Labs  12/25/12 0742  PROT 6.5  ALBUMIN  3.4*  AST 41*  ALT 28  ALKPHOS 154*  BILITOT 0.7   No results found for this basename: INR,  in the last 72 hours BNP (last 3 results)  Recent Labs  10/18/12 0530 10/20/12 0500 12/25/12 1906  PROBNP 2160.0* 2407.0* 6214.0*   Lipid Panel  No results found for this basename: chol, trig, hdl, cholhdl, vldl, ldlcalc      Imaging:  Dg Chest 2 View  12/25/2012  *RADIOLOGY REPORT*  Clinical Data: 78 year old female with shortness of breath, weakness, difficulty urinating.  CHEST - 2 VIEW  Comparison: 09/22/2012 and earlier.  Findings: Stable lung volumes. Stable cardiomegaly and mediastinal contours.  Pulmonary vascular congestion with prominent central pulmonary vasculature is stable.  Linear areas of scarring atelectasis greater in the left lung also appears stable.  No pneumothorax.  No pleural effusion or consolidation. Visualized tracheal air column is within normal limits.  Stable visualized osseous structures.  IMPRESSION: Pulmonary vascular congestion stable to mildly increased such that mild interstitial edema may be present.  Otherwise stable chest with cardiomegaly and atelectasis.   Original Report Authenticated By: Erskine Speed, M.D.       Assessment/Plan:   Principal Problem:   Hypotension Active Problems:   Chronic respiratory failure with hypoxia   DM2 (diabetes mellitus, type 2)   Elevated troponin -  most likely related to Cor Pulmonale.   Unspecified hypothyroidism   Normal coronary arteries at cath 2011   Obesity, morbid (more than 100 lbs over ideal weight or BMI > 40)   Pulmonary hypertension, moderate to severe; Severe by Echo 09/2012   Diastolic CHF, chronic   UTI (urinary tract infection)   Hypotension, unspecified   Acute renal failure   Dyspnea on exertion   Urinary retention   Leukocytosis, unspecified   Dysuria    Feels well. Renal function improved to Cr 1.49 from 2.0 with hydration. Hypotension has resolved. Will resume coreg at 3.125 mg bid  today and losartan at low dose 25 mg and titrate as renal function allows. F/U BNP, bmet in am.  Lennette Bihari, MD, [facility] 12/26/2012, 9:28 AM

## 2012-12-26 NOTE — Evaluation (Signed)
Physical Therapy Evaluation Patient Details Name: [name] MRN: [id number] DOB: 05/24/1935 Today's Date: 12/26/2012 Time: 8469-6295 PT Time Calculation (min): 32 min  PT Assessment / Plan / Recommendation Clinical Impression  78 yo female admitted with inablity to urinate. Pt with hx of HF, COPD and uses O2 at home.  Pt also has an unsteady gait easy fatigue with SOB on exertion. She will benefit from HHPT and RW at home.  She also would benefit from [facility] if available.    PT Assessment  Patient needs continued PT services    Follow Up Recommendations  Home health PT [facility] Aide)    Does the patient have the potential to tolerate intense rehabilitation      Barriers to Discharge        Equipment Recommendations  Rolling walker with 5" wheels    Recommendations for Other Services OT consult   Frequency Min 3X/week    Precautions / Restrictions Restrictions Weight Bearing Restrictions: No   Pertinent Vitals/Pain Pt very SOB with exertion.  Pt maintained on 3LO2 throughout RX      Mobility  Transfers Transfers: Sit to Stand;Stand to Sit Sit to Stand: 6: Modified independent (Device/Increase time) Stand to Sit: 6: Modified independent (Device/Increase time) Details for Transfer Assistance: pt with cues to push up with arms Ambulation/Gait Ambulation/Gait Assistance: 4: Min assist Ambulation Distance (Feet): 25 Feet Assistive device: Rolling walker Ambulation/Gait Assistance Details: cues to slow down to try to maintain breathing, but pt still SOB with exertion.  Pt ataxic without device, much improved with RW.  Gait Pattern: Step-through pattern;Decreased step length - right;Decreased step length - left General Gait Details: Trial use of rollator to provide pt at seat to rest or hold O2 tank.  Pt felt that he did not receive the mobility support she needed.  Pt confident and mod I with RW Stairs: No Wheelchair Mobility Wheelchair Mobility: No    Exercises General  Exercises - Lower Extremity Ankle Circles/Pumps: AROM;Both;10 reps;Seated Long Arc Quad: AROM;Both;10 reps;Seated Hip ABduction/ADduction: AROM;Both;10 reps;Seated Heel Raises: AROM;Both;10 reps;Seated Other Exercises Other Exercises: repeated sit to stand x 3 reps Other Exercises: standing bilateral hip to hip to activate core Other Exercises: deep breathing Other Exercises: pt counseled to do short bouts of activity frequently throughout the day.  She acknowledged understanding   PT Diagnosis: Difficulty walking;Generalized weakness  PT Problem List: Decreased strength;Decreased activity tolerance;Decreased balance;Decreased safety awareness;Cardiopulmonary status limiting activity;Obesity PT Treatment Interventions: DME instruction;Gait training;Functional mobility training;Therapeutic activities;Therapeutic exercise;Balance training;Patient/family education   PT Goals Acute Rehab PT Goals PT Goal Formulation: With patient/family Time For Goal Achievement: 01/09/13 Potential to Achieve Goals: Good Pt will go Supine/Side to Sit: Independently PT Goal: Supine/Side to Sit - Progress: Goal set today Pt will go Sit to Supine/Side: Independently PT Goal: Sit to Supine/Side - Progress: Goal set today Pt will go Sit to Stand: Independently PT Goal: Sit to Stand - Progress: Goal set today Pt will go Stand to Sit: Independently PT Goal: Stand to Sit - Progress: Goal set today Pt will Ambulate: >150 feet;with modified independence PT Goal: Ambulate - Progress: Goal set today  Visit Information  Last PT Received On: 12/26/12    Subjective Data  Subjective: "that one is too loose!"  re: rollator.  Pt prefers RW Patient Stated Goal: to go home and get HHPT   Prior Functioning  Home Living Lives With: Other (Comment) (bother) Available Help at Discharge: Family (son lives nearby) Type of Home: Mobile home Home Access:  Ramped entrance Home Layout: One level Home Adaptive Equipment:  Walker - standard;Wheelchair - manual;Other (comment) (oxygen) Prior Function Level of Independence: Independent with assistive device(s) Communication Communication: No difficulties    Cognition  Cognition Arousal/Alertness: Awake/alert Behavior During Therapy: WFL for tasks assessed/performed Overall Cognitive Status: Within Functional Limits for tasks assessed    Extremity/Trunk Assessment Right Lower Extremity Assessment RLE ROM/Strength/Tone: WFL for tasks assessed RLE Sensation: WFL - Light Touch;WFL - Proprioception Left Lower Extremity Assessment LLE ROM/Strength/Tone: WFL for tasks assessed;Deficits LLE ROM/Strength/Tone Deficits: numbness and decreased great toe extension from remote fracture to toe LLE Sensation: WFL - Light Touch;WFL - Proprioception Trunk Assessment Trunk Assessment: Other exceptions Trunk Exceptions: obese   Balance Balance Balance Assessed: Yes Static Sitting Balance Static Sitting - Balance Support: No upper extremity supported Static Sitting - Level of Assistance: 7: Independent Static Standing Balance Static Standing - Balance Support: Bilateral upper extremity supported Static Standing - Level of Assistance: 6: Modified independent (Device/Increase time) Static Standing - Comment/# of Minutes: pt with lmited endurance of activity  End of Session PT - End of Session Equipment Utilized During Treatment: Oxygen Activity Tolerance: Treatment limited secondary to medical complications (Comment);Other (comment) (SOB) Patient left: in chair;with family/visitor present  GP    Rosey Bath K. Manson Passey, PT [phone]/10/2012, 1:49 PM

## 2012-12-26 NOTE — Progress Notes (Signed)
Pt's troponin= 0.99, pt no c/o pain. Dr. Earlene Plater notified no orders made at this time.

## 2012-12-26 NOTE — H&P (Signed)
Internal Medicine Teaching Service Attending Note Date: 12/26/2012  Patient name: [name]  Medical record number: [id number]  Date of birth: Jun 27, 1935   I have seen and evaluated [name] and discussed their care with the Residency Team.    Ms. Kaigler is a 78 yo woman with a complicated PMH including COPD, HTN, pHTN with cor pulmonale and RHF who presents due to not being able to urinate for 24 hours at home, despite being on diuretics.  Ms. Dimaio further denotes multiple bouts of diarrhea recently that have resolved, but which were associated with nausea.  She further notes dysuria 1 day prior to admission with associated suprapubic pain (9/10, pressure like), lightheadedness and mid epigastric pain which is chronic.  Other issues include an increased weight with associated DOE, abdominal distention.  These are chronic issues due to her HF and she is on [facility] at home.  She further has a chronic unsteady gait, using a walker.  She has no sick contacts.  In the ED she was found to have hypotension and AKI.  She further received 3 liters of fluid in the ED.    For further PMH, PSH, meds, allergies, ROS, please review Residents note.   Physical Exam: Blood pressure 109/57, pulse 101, temperature 97.6 F (36.4 C), temperature source Oral, resp. rate 20, height 5\' 1"  (1.549 m), weight 228 lb 3.2 oz (103.511 kg), SpO2 91.00%. General appearance: alert, cooperative, appears stated age and sitting on side of bed in NAD Head: Normocephalic, without obvious abnormality, atraumatic Eyes: EOMI, anicteric sclerae Neck: JVD - a few cm above sternal notch and no neck stiffnes Lungs: fine crackles at the bases, no wheezing Heart: RR, no murmur noted Abdomen: soft, +BS Extremities: 1+ edema to bilateral LE Pulses: 2+ and symmetric Skin: + skin changes associated with venous insufficiency to bilateral shins, + ecchymoses on forearms  Lab results: Results for orders placed during the hospital  encounter of 12/25/12 (from the past 24 hour(s))  TROPONIN I     Status: Abnormal   Collection Time    12/25/12  2:05 PM      Result Value Range   Troponin I 0.84 (*) <0.30 ng/mL  MAGNESIUM     Status: None   Collection Time    12/25/12  2:05 PM      Result Value Range   Magnesium 1.7  1.5 - 2.5 mg/dL  PRO B NATRIURETIC PEPTIDE     Status: Abnormal   Collection Time    12/25/12  7:06 PM      Result Value Range   Pro B Natriuretic peptide (BNP) 6214.0 (*) 0 - 450 pg/mL  TROPONIN I     Status: Abnormal   Collection Time    12/25/12  7:06 PM      Result Value Range   Troponin I 0.99 (*) <0.30 ng/mL  GLUCOSE, CAPILLARY     Status: Abnormal   Collection Time    12/25/12  9:29 PM      Result Value Range   Glucose-Capillary 210 (*) 70 - 99 mg/dL   Comment 1 Notify RN     Comment 2 Documented in Chart    SODIUM, URINE, RANDOM     Status: None   Collection Time    12/25/12 10:44 PM      Result Value Range   Sodium, Ur <10    BASIC METABOLIC PANEL     Status: Abnormal   Collection Time    12/26/12  1:17 AM      Result Value Range   Sodium 129 (*) 135 - 145 mEq/L   Potassium 4.5  3.5 - 5.1 mEq/L   Chloride 97  96 - 112 mEq/L   CO2 22  19 - 32 mEq/L   Glucose, Bld 173 (*) 70 - 99 mg/dL   BUN 33 (*) 6 - 23 mg/dL   Creatinine, Ser 7.84 (*) 0.50 - 1.10 mg/dL   Calcium 9.0  8.4 - 69.6 mg/dL   GFR calc non Af Amer 33 (*) >90 mL/min   GFR calc Af Amer 38 (*) >90 mL/min  CBC     Status: Abnormal   Collection Time    12/26/12  1:17 AM      Result Value Range   WBC 10.6 (*) 4.0 - 10.5 K/uL   RBC 4.69  3.87 - 5.11 MIL/uL   Hemoglobin 14.0  12.0 - 15.0 g/dL   HCT 29.5  28.4 - 13.2 %   MCV 86.6  78.0 - 100.0 fL   MCH 29.9  26.0 - 34.0 pg   MCHC 34.5  30.0 - 36.0 g/dL   RDW 44.0  10.2 - 72.5 %   Platelets 112 (*) 150 - 400 K/uL  TROPONIN I     Status: Abnormal   Collection Time    12/26/12  1:17 AM      Result Value Range   Troponin I 0.78 (*) <0.30 ng/mL  GLUCOSE, CAPILLARY      Status: Abnormal   Collection Time    12/26/12  5:57 AM      Result Value Range   Glucose-Capillary 137 (*) 70 - 99 mg/dL   Comment 1 Notify RN     Comment 2 Documented in Chart      Imaging results:  Dg Chest 2 View  12/25/2012  *RADIOLOGY REPORT*  Clinical Data: 78 year old female with shortness of breath, weakness, difficulty urinating.  CHEST - 2 VIEW  Comparison: 09/22/2012 and earlier.  Findings: Stable lung volumes. Stable cardiomegaly and mediastinal contours.  Pulmonary vascular congestion with prominent central pulmonary vasculature is stable.  Linear areas of scarring atelectasis greater in the left lung also appears stable.  No pneumothorax.  No pleural effusion or consolidation. Visualized tracheal air column is within normal limits.  Stable visualized osseous structures.  IMPRESSION: Pulmonary vascular congestion stable to mildly increased such that mild interstitial edema may be present.  Otherwise stable chest with cardiomegaly and atelectasis.   Original Report Authenticated By: Erskine Speed, M.D.     Assessment and Plan: I agree with the formulated Assessment and Plan with the following changes:   1. AKI on CKD with concomitant orthostatic hypotension - Possibly due to acute diarrheal illness/UTI and subsequent ongoing use of diuretics - her Cr was 2 on admission and improved with fluids and holding diuretics - No more fluids, continue to monitor off diuretics X 1 more day - Hold losartan, bumex, cardizem, coreg, resume coreg if BP okay after fluids - Urine Na < 10, making dehydration more likely  2. ? UTI, dysuria - dysuria and decrease UOP makes this concerning, she is improved this morning - On cipro, UC pending - WBC 14.2 on admission  3. Pulmonary HTN with cor pulmonale - Will hold off on any further fluids, though she was volume down, she is now increasing in weight and she likely will easily tip over into a heart failure exacerbation - Hold bumex X 1 more day.   If  her breathing should worsen or any change in clinical status, would add back bumex at 1/3 to 1/2 home dose and titrate up to home dose - Currently saturating well on [place], but will need to monitor closely - CE were trended, these are likely elevated in the setting of her disease and not related to ACS, cardiology consulted and following  Other issues per resident note.    Inez Catalina, MD 5/3/201410:10 AM

## 2012-12-26 NOTE — Progress Notes (Signed)
Resp Care Note;Pt refusing CPAP. 

## 2012-12-26 NOTE — Progress Notes (Signed)
Subjective: Denies sob, dysuria improved, denies chest pain.  Patient is urinating now without problems. She is having heartburn and wants medication for heartburn. She reports mild h/a   Objective: Vital signs in last 24 hours: Filed Vitals:   12/26/12 0643 12/26/12 0645 12/26/12 0646 12/26/12 0649  BP: 111/63 109/55 100/58 109/57  Pulse: 97 98 99 101  Temp:      TempSrc:      Resp: 18 20 20 20   Height:      Weight:      SpO2: 90% 91% 90% 91%   Weight change:   Intake/Output Summary (Last 24 hours) at 12/26/12 1421 Last data filed at 12/26/12 1300  Gross per 24 hour  Intake    360 ml  Output    875 ml  Net   -515 ml   Vitals reviewed. General: resting in bed, NAD HEENT: [place]/at, no scleral icterus Cardiac: RRR, no rubs, murmurs or gallops Pulm: clear to auscultation bilaterally, no wheezes, rales, or rhonchi Abd: soft, mild ttp LLQ at site sq shots ecchymosis noted there, nondistended, BS present Ext: warm and well perfused, non pitting edema Neuro: alert and oriented X3, grossly neurologically intact  Lab Results: Basic Metabolic Panel:  Recent Labs Lab 12/25/12 0742 12/25/12 1405 12/26/12 0117  NA 135  --  129*  K 5.1  --  4.5  CL 96  --  97  CO2 25  --  22  GLUCOSE 243*  --  173*  BUN 32*  --  33*  CREATININE 2.00*  --  1.49*  CALCIUM 9.5  --  9.0  MG  --  1.7  --    Liver Function Tests:  Recent Labs Lab 12/25/12 0742  AST 41*  ALT 28  ALKPHOS 154*  BILITOT 0.7  PROT 6.5  ALBUMIN 3.4*   CBC:  Recent Labs Lab 12/25/12 0742 12/26/12 0117  WBC 14.2* 10.6*  NEUTROABS 11.4*  --   HGB 15.3* 14.0  HCT 45.2 40.6  MCV 88.1 86.6  PLT 140* 112*   Cardiac Enzymes:  Recent Labs Lab 12/25/12 1405 12/25/12 1906 12/26/12 0117  TROPONINI 0.84* 0.99* 0.78*   BNP:  Recent Labs Lab 12/25/12 1906  PROBNP 6214.0*   CBG:  Recent Labs Lab 12/25/12 2129 12/26/12 0557 12/26/12 1141  GLUCAP 210* 137* 184*   Urinalysis:  Recent Labs Lab  12/25/12 0725  COLORURINE AMBER*  LABSPEC 1.023  PHURINE 5.0  GLUCOSEU 100*  HGBUR NEGATIVE  BILIRUBINUR MODERATE*  KETONESUR 15*  PROTEINUR NEGATIVE  UROBILINOGEN 1.0  NITRITE NEGATIVE  LEUKOCYTESUR NEGATIVE   Misc. Labs: Urine culture pending Urine urea   Micro Results: No results found for this or any previous visit (from the past 240 hour(s)). Studies/Results: Dg Chest 2 View  12/25/2012  *RADIOLOGY REPORT*  Clinical Data: 78 year old female with shortness of breath, weakness, difficulty urinating.  CHEST - 2 VIEW  Comparison: 09/22/2012 and earlier.  Findings: Stable lung volumes. Stable cardiomegaly and mediastinal contours.  Pulmonary vascular congestion with prominent central pulmonary vasculature is stable.  Linear areas of scarring atelectasis greater in the left lung also appears stable.  No pneumothorax.  No pleural effusion or consolidation. Visualized tracheal air column is within normal limits.  Stable visualized osseous structures.  IMPRESSION: Pulmonary vascular congestion stable to mildly increased such that mild interstitial edema may be present.  Otherwise stable chest with cardiomegaly and atelectasis.   Original Report Authenticated By: Erskine Speed, M.D.    Medications:  Scheduled  Meds: . aspirin  81 mg Oral Daily  . brimonidine  1 drop Both Eyes BID  . carvedilol  3.125 mg Oral BID WC  . ciprofloxacin  250 mg Oral Q18H  . ezetimibe  10 mg Oral QHS  . heparin  5,000 Units Subcutaneous Q8H  . insulin aspart  0-5 Units Subcutaneous QHS  . insulin aspart  0-9 Units Subcutaneous TID WC  . insulin glargine  5 Units Subcutaneous QHS  . levothyroxine  150 mcg Oral QAC breakfast  . loratadine  10 mg Oral Daily  . losartan  25 mg Oral Daily  . NON FORMULARY 40 mg  40 mg Oral Daily  . simvastatin  10 mg Oral q1800  . sodium chloride  3 mL Intravenous Q12H  . sodium chloride  3 mL Intravenous Q12H  . Travoprost (BAK Free)  1 drop Both Eyes BID   Continuous  Infusions:  PRN Meds:.sodium chloride, acetaminophen, ALPRAZolam, ondansetron (ZOFRAN) IV, ondansetron, oxyCODONE-acetaminophen, sodium chloride Assessment/Plan: 78 y.o with significant medical history presents with urinary retention, hypotension found to have AKI and elevated troponin.   1. Orthostatic hypotension with history of HTN  -BP 85/38 on admission. Likely multifactorial due to volume depletion (from 3 days of diarrhea) and recent increased in dosage of diuretics  -Bolused 3L NS in the ED. Try to avoid further fluids due to history of heart failure and pulm. HTN  -Holding Bumex, Cardiazem, resumed a lower dose Coreg 3.125 bid and Losartan 25 mg qd  -orthostatics not recorded correctly in the chart but the patient was orthostatic on admission.   2. Acute on CKD  -Cr. 2.00 on admission. Baseline ~1.0-1.3. Likely prerenal due to volume depletion from diuretics and diarrhea  -Creatinine 1.49 today  -pending Urine urea  -trending BMET  3. Dyspnea on breath with exertion  -Etiology unclear. Could multifactorial be cardiac or pulmonary as patient has chronic CHF due to right heart failure secondary to pulmonary HTN, obesity, COPD. -Currently respiratory status stable on 3 L home dose.   -Strict i/o, daily weight (weight on admission was 225 lbs with todays wt being 228). Dry weight is ~213 per patient   4. Elevated troponin  -0.84 on admission. Trop 0.84-->0.99-->0.78.  Etiology could be secondary to age, AKI, chronic right heart failure due to pulmonary HTN. Cards thought less likely due to ischemia. She has had elevated troponins in 09/2012 0.4-0.8 during an admission for heart failure  -consulted cardiology and they are following the patient resumed low dose of Coreg and Losartan.    5. Chronic congestive heart failure (right heart failure secondary to pulmonary HTN)  -Normal cardiac cath 2011. Last admission was 09/2012. Her cardiologist is Dr. Rennis Golden who she recently saw ~2 weeks ago  and her Bumex dose was increased from 2 mg bid to 3 mg bid. Per patient dry weight is 213. Weight 5/1 was 219 and weight on admission was 225 pounds. Weight today is 228 -Troponin 0.84 12/25/12 but she has had elevated trop in the past 0.4-0.8. ProBNP 6214 after 3 L of fluid with most recent BNP 2407 09/2012.  -Hold Bumex today. Consider resuming tomorrow - If sob overnight resume Bumex 1 mg bid  - [facility] [phone] following -daily weights, strict intake and output   6. Urinary retention, resolved -monitor i/o   7. Leukocytosis, improving  -WBC 14.2-->10.6. Could be due to acute stress will r/o infection  -UA amber, turbid, 100 glucose, moderate biluribin, 15 ketones, few bacteria,  Ca oxylate crystals  -Trend CBC, pending urine culture   8. Resolved dysuria -Given Rocephin in the Ed  -Pending urine culture to follow  -Will treat with Cipro 250 mg q 18 hours (Day 2)  9. COPD on 3 L O2 continous  -No acute issues   10. Pulmonary HTN (moderate to severe)  -Likely due to OSA noncompliant with cpap at home   11. DM 2 (8.6% 09/2012)  -Monitor cbg, SSI. Lantus 5 units. D/c oral hypoglycemics home meds   12. DVT px  -Heparin sq   13. F/E/N  -Try to avoid IVF  -Will monitor and trend BMET and replace prn  -Carb mod diet     Dispo: Disposition is deferred at this time, awaiting improvement of current medical problems.  Anticipated discharge in approximately 1-2 day(s).   The patient does have a current PCP (FUSCO,LAWRENCE J., MD), therefore will be requiring OPC follow-up after discharge.   The patient does not have transportation limitations that hinder transportation to clinic appointments.  .Services Needed at time of discharge: Y = Yes, Blank = No PT:   OT:   RN:   Equipment:   Other:     LOS: 1 day   [name] [phone] 12/26/2012, 2:21 PM

## 2012-12-26 NOTE — Plan of Care (Signed)
Problem: Phase I Progression Outcomes Goal: EF % per last Echo/documented,Core Reminder form on chart Outcome: Completed/Met Date Met:  12/26/12 EF 55-65%(10-14-12)

## 2012-12-27 DIAGNOSIS — E119 Type 2 diabetes mellitus without complications: Secondary | ICD-10-CM

## 2012-12-27 DIAGNOSIS — R300 Dysuria: Secondary | ICD-10-CM

## 2012-12-27 DIAGNOSIS — R0902 Hypoxemia: Secondary | ICD-10-CM

## 2012-12-27 DIAGNOSIS — E869 Volume depletion, unspecified: Secondary | ICD-10-CM

## 2012-12-27 DIAGNOSIS — N179 Acute kidney failure, unspecified: Principal | ICD-10-CM

## 2012-12-27 DIAGNOSIS — R0989 Other specified symptoms and signs involving the circulatory and respiratory systems: Secondary | ICD-10-CM

## 2012-12-27 DIAGNOSIS — R7989 Other specified abnormal findings of blood chemistry: Secondary | ICD-10-CM

## 2012-12-27 DIAGNOSIS — I959 Hypotension, unspecified: Secondary | ICD-10-CM

## 2012-12-27 DIAGNOSIS — J9610 Chronic respiratory failure, unspecified whether with hypoxia or hypercapnia: Secondary | ICD-10-CM

## 2012-12-27 DIAGNOSIS — I509 Heart failure, unspecified: Secondary | ICD-10-CM

## 2012-12-27 DIAGNOSIS — E039 Hypothyroidism, unspecified: Secondary | ICD-10-CM

## 2012-12-27 DIAGNOSIS — D72829 Elevated white blood cell count, unspecified: Secondary | ICD-10-CM

## 2012-12-27 DIAGNOSIS — I2789 Other specified pulmonary heart diseases: Secondary | ICD-10-CM

## 2012-12-27 DIAGNOSIS — I5032 Chronic diastolic (congestive) heart failure: Secondary | ICD-10-CM

## 2012-12-27 DIAGNOSIS — R0609 Other forms of dyspnea: Secondary | ICD-10-CM

## 2012-12-27 LAB — CBC WITH DIFFERENTIAL/PLATELET
Basophils Absolute: 0 10*3/uL (ref 0.0–0.1)
Eosinophils Absolute: 0.2 10*3/uL (ref 0.0–0.7)
Eosinophils Relative: 2 % (ref 0–5)
HCT: 43.1 % (ref 36.0–46.0)
Lymphocytes Relative: 22 % (ref 12–46)
MCH: 29.5 pg (ref 26.0–34.0)
MCHC: 33.6 g/dL (ref 30.0–36.0)
MCV: 87.8 fL (ref 78.0–100.0)
Monocytes Absolute: 0.8 10*3/uL (ref 0.1–1.0)
Platelets: 120 10*3/uL — ABNORMAL LOW (ref 150–400)
RDW: 14.2 % (ref 11.5–15.5)
WBC: 7.9 10*3/uL (ref 4.0–10.5)

## 2012-12-27 LAB — GLUCOSE, CAPILLARY: Glucose-Capillary: 156 mg/dL — ABNORMAL HIGH (ref 70–99)

## 2012-12-27 LAB — BASIC METABOLIC PANEL
BUN: 29 mg/dL — ABNORMAL HIGH (ref 6–23)
CO2: 25 mEq/L (ref 19–32)
Calcium: 9.7 mg/dL (ref 8.4–10.5)
Creatinine, Ser: 1.04 mg/dL (ref 0.50–1.10)
GFR calc non Af Amer: 51 mL/min — ABNORMAL LOW (ref >90)
Glucose, Bld: 118 mg/dL — ABNORMAL HIGH (ref 70–99)

## 2012-12-27 MED ORDER — LOSARTAN POTASSIUM 25 MG PO TABS
25.0000 mg | ORAL_TABLET | Freq: Two times a day (BID) | ORAL | Status: DC
  Administered 2012-12-27 – 2012-12-28 (×2): 25 mg via ORAL
  Filled 2012-12-27 (×3): qty 1

## 2012-12-27 MED ORDER — CARVEDILOL 6.25 MG PO TABS
6.2500 mg | ORAL_TABLET | Freq: Two times a day (BID) | ORAL | Status: DC
  Administered 2012-12-27 – 2012-12-28 (×2): 6.25 mg via ORAL
  Filled 2012-12-27 (×4): qty 1

## 2012-12-27 MED ORDER — BUMETANIDE 1 MG PO TABS
1.0000 mg | ORAL_TABLET | Freq: Every day | ORAL | Status: DC
  Administered 2012-12-27 – 2012-12-28 (×2): 1 mg via ORAL
  Filled 2012-12-27 (×2): qty 1

## 2012-12-27 NOTE — Progress Notes (Signed)
Utilization Review Completed.   Macen Joslin, RN, BSN Nurse Case Manager  [phone]  

## 2012-12-27 NOTE — Progress Notes (Signed)
[facility] Progress Note  Subjective:  Breathing better  Objective:   Vital Signs in the last 24 hours: Temp:  [97.3 F (36.3 C)-98.4 F (36.9 C)] 97.3 F (36.3 C) (05/04 0412) Pulse Rate:  [81-104] 91 (05/04 0415) Resp:  [18-19] 18 (05/04 0415) BP: (95-116)/(52-71) 106/66 mmHg (05/04 0415) SpO2:  [88 %-92 %] 88 % (05/04 0415) Weight:  [103.602 kg (228 lb 6.4 oz)] 103.602 kg (228 lb 6.4 oz) (05/04 0415)  Intake/Output from previous day: 05/03 0701 - 05/04 0700 In: 680 [P.O.:680] Out: 775 [Urine:775]  Scheduled: . aspirin  81 mg Oral Daily  . brimonidine  1 drop Both Eyes BID  . carvedilol  3.125 mg Oral BID WC  . ciprofloxacin  250 mg Oral Q18H  . ezetimibe  10 mg Oral QHS  . heparin  5,000 Units Subcutaneous Q8H  . insulin aspart  0-5 Units Subcutaneous QHS  . insulin aspart  0-9 Units Subcutaneous TID WC  . insulin glargine  5 Units Subcutaneous QHS  . levothyroxine  150 mcg Oral QAC breakfast  . loratadine  10 mg Oral Daily  . losartan  25 mg Oral Daily  . omeprazole  20 mg Oral q morning - 10a  . simvastatin  10 mg Oral q1800  . sodium chloride  3 mL Intravenous Q12H  . sodium chloride  3 mL Intravenous Q12H  . Travoprost (BAK Free)  1 drop Both Eyes BID    Physical Exam:   General appearance: alert, cooperative and no distress Neck: no JVD and supple, symmetrical, trachea midline Lungs: clear to auscultation bilaterally Heart: regular rate and rhythm and 1/6 sem Abdomen: soft, non-tender; bowel sounds normal; no masses,  no organomegaly Extremities: trace edema   Rate: 90  Rhythm: normal sinus rhythm, PVC's  Lab Results:    Recent Labs  12/26/12 0117 12/27/12 0540  NA 129* 134*  K 4.5 4.5  CL 97 102  CO2 22 25  GLUCOSE 173* 118*  BUN 33* 29*  CREATININE 1.49* 1.04    Recent Labs  12/25/12 1906 12/26/12 0117  TROPONINI 0.99* 0.78*   Hepatic Function Panel  Recent Labs  12/25/12 0742  PROT 6.5   ALBUMIN 3.4*  AST 41*  ALT 28  ALKPHOS 154*  BILITOT 0.7   No results found for this basename: INR,  in the last 72 hours BNP (last 3 results)  Recent Labs  10/20/12 0500 12/25/12 1906 12/27/12 0540  PROBNP 2407.0* 6214.0* 4252.0*    Lipid Panel  No results found for this basename: chol, trig, hdl, cholhdl, vldl, ldlcalc      Imaging:  Dg Chest 2 View  12/25/2012  *RADIOLOGY REPORT*  Clinical Data: 78 year old female with shortness of breath, weakness, difficulty urinating.  CHEST - 2 VIEW  Comparison: 09/22/2012 and earlier.  Findings: Stable lung volumes. Stable cardiomegaly and mediastinal contours.  Pulmonary vascular congestion with prominent central pulmonary vasculature is stable.  Linear areas of scarring atelectasis greater in the left lung also appears stable.  No pneumothorax.  No pleural effusion or consolidation. Visualized tracheal air column is within normal limits.  Stable visualized osseous structures.  IMPRESSION: Pulmonary vascular congestion stable to mildly increased such that mild interstitial edema may be present.  Otherwise stable chest with cardiomegaly and atelectasis.   Original Report Authenticated By: Erskine Speed, M.D.       Assessment/Plan:   Principal Problem:   Hypotension Active Problems:   Chronic respiratory failure with hypoxia  DM2 (diabetes mellitus, type 2)   Elevated troponin - most likely related to Cor Pulmonale.   Unspecified hypothyroidism   Normal coronary arteries at cath 2011   Obesity, morbid (more than 100 lbs over ideal weight or BMI > 40)   Pulmonary hypertension, moderate to severe; Severe by Echo 09/2012   Diastolic CHF, chronic   UTI (urinary tract infection)   Hypotension, unspecified   Acute renal failure   Dyspnea on exertion   Urinary retention   Leukocytosis, unspecified   Dysuria  Doing better. Tolerating resumption of some meds yesterday. Will further titrate carvedilol to 6.25 mg bid and losartan to 25  mg bid. Cr at baseline. BNP better but remains elevated; will add back a  reduced dose of bumex today at 1 mg daily with other medication adjustments. Recommend keep today with possible dc from cardiovascular standpoint tomorrow.Lennette Bihari, MD, [facility] 12/27/2012, 9:00 AM

## 2012-12-27 NOTE — Progress Notes (Signed)
Pt refused heparin injections, she stated it is causing her to bleed.

## 2012-12-27 NOTE — Progress Notes (Signed)
Internal Medicine Teaching Service Attending Note Date: 12/27/2012  Patient name: [name]  Medical record number: [id number]  Date of birth: 16-Feb-1935    This patient has been seen and discussed with the house staff. Please see their note for complete details. I concur with their findings with the following additions/corrections:  Patient improving clinically along with her low sodium and renal function.  Will slowly start titrating and adding back her cardiac medications today including coreg, bumex and losartan.  Monitor for any clinical changes.   MULLEN, EMILY 12/27/2012, 11:19 AM

## 2012-12-27 NOTE — Progress Notes (Signed)
Subjective: Patient states that she feels better. Sleeps well. Denies PND. She has SOB with exertion and states that her SOB with activity is at her baseline. No acute event overnight.  Objective: Vital signs in last 24 hours: Filed Vitals:   12/26/12 2130 12/27/12 0412 12/27/12 0414 12/27/12 0415  BP: 100/60 97/59 109/71 106/66  Pulse: 89 81 89 91  Temp:  97.3 F (36.3 C)    TempSrc:  Oral    Resp:  18 18 18   Height:      Weight:    228 lb 6.4 oz (103.602 kg)  SpO2:  90% 90% 88%   Weight change: 3 lb 6.4 oz (1.542 kg)  Intake/Output Summary (Last 24 hours) at 12/27/12 0919 Last data filed at 12/27/12 0500  Gross per 24 hour  Intake    560 ml  Output    625 ml  Net    -65 ml   Vitals reviewed.  General: resting in bed, NAD  HEENT: [place]/at, no scleral icterus  Cardiac: RRR, no rubs, murmurs or gallops  Pulm: B/L lung sounds diminished. Right basilar mild rales noted. No wheezing Abd: soft, mild ttp LLQ at site sq shots ecchymosis noted there, nondistended, BS present  Ext: warm and well perfused, non pitting edema  Neuro: alert and oriented X3, grossly neurologically intact  Lab Results: Basic Metabolic Panel:  Recent Labs Lab 12/25/12 1405 12/26/12 0117 12/27/12 0540  Raef Sprigg  --  129* 134*  K  --  4.5 4.5  CL  --  97 102  CO2  --  22 25  GLUCOSE  --  173* 118*  BUN  --  33* 29*  CREATININE  --  1.49* 1.04  CALCIUM  --  9.0 9.7  MG 1.7  --  2.2   Liver Function Tests:  Recent Labs Lab 12/25/12 0742  AST 41*  ALT 28  ALKPHOS 154*  BILITOT 0.7  PROT 6.5  ALBUMIN 3.4*   CBC:  Recent Labs Lab 12/25/12 0742 12/26/12 0117 12/27/12 0540  WBC 14.2* 10.6* 7.9  NEUTROABS 11.4*  --  5.1  HGB 15.3* 14.0 14.5  HCT 45.2 40.6 43.1  MCV 88.1 86.6 87.8  PLT 140* 112* 120*   Cardiac Enzymes:  Recent Labs Lab 12/25/12 1405 12/25/12 1906 12/26/12 0117  TROPONINI 0.84* 0.99* 0.78*   BNP:  Recent Labs Lab 12/25/12 1906 12/27/12 0540  PROBNP 6214.0*  4252.0*   D-Dimer: No results found for this basename: DDIMER,  in the last 168 hours CBG:  Recent Labs Lab 12/25/12 2129 12/26/12 0557 12/26/12 1141 12/26/12 1637 12/26/12 2110  GLUCAP 210* 137* 184* 181* 149*   Urinalysis:  Recent Labs Lab 12/25/12 0725  COLORURINE AMBER*  LABSPEC 1.023  PHURINE 5.0  GLUCOSEU 100*  HGBUR NEGATIVE  BILIRUBINUR MODERATE*  KETONESUR 15*  PROTEINUR NEGATIVE  UROBILINOGEN 1.0  NITRITE NEGATIVE  LEUKOCYTESUR NEGATIVE   Micro Results: Recent Results (from the past 240 hour(s))  URINE CULTURE     Status: None   Collection Time    12/25/12  7:25 AM      Result Value Range Status   Specimen Description URINE, CATHETERIZED   Final   Special Requests CX ADDED 5/2 1345   Final   Culture  Setup Time 12/25/2012 16:44   Final   Colony Count NO GROWTH   Final   Culture NO GROWTH   Final   Report Status 12/26/2012 FINAL   Final   Studies/Results: No results found. Medications:  I have reviewed the patient's current medications. Scheduled Meds: . aspirin  81 mg Oral Daily  . brimonidine  1 drop Both Eyes BID  . bumetanide  1 mg Oral Daily  . carvedilol  6.25 mg Oral BID WC  . ciprofloxacin  250 mg Oral Q18H  . ezetimibe  10 mg Oral QHS  . heparin  5,000 Units Subcutaneous Q8H  . insulin aspart  0-5 Units Subcutaneous QHS  . insulin aspart  0-9 Units Subcutaneous TID WC  . insulin glargine  5 Units Subcutaneous QHS  . levothyroxine  150 mcg Oral QAC breakfast  . loratadine  10 mg Oral Daily  . losartan  25 mg Oral BID  . omeprazole  20 mg Oral q morning - 10a  . simvastatin  10 mg Oral q1800  . sodium chloride  3 mL Intravenous Q12H  . sodium chloride  3 mL Intravenous Q12H  . Travoprost (BAK Free)  1 drop Both Eyes BID   Continuous Infusions:  PRN Meds:.sodium chloride, acetaminophen, ALPRAZolam, ondansetron (ZOFRAN) IV, ondansetron, oxyCODONE-acetaminophen, sodium chloride Assessment/Plan:  78 y.o with significant medical  history presents with urinary retention, hypotension found to have AKI and elevated troponin.   1. Orthostatic hypotension with history of HTN , resolved - slowly medication titration - will increase Coreg and Losartan dosage today pre cardiology recs. - Bumex 1 mg daily today  2. Acute on CKD, likely pre-renal. Resolved.   3. Dyspnea on breath with exertion- at baseline  4. Elevated troponin , likely related CHF exacerbation and worsening of renal function. -0.84 on admission. Trop 0.84-->0.99-->0.78. - appreciate cardiology inputs.   5. Chronic congestive heart failure (right heart failure secondary to pulmonary HTN), dry weight 213 lbs  -  Received 3 L IVF at ED. Weight up to 228. BNP initially at ~6000 and now trending down - Patient SOB at baseline.  - appreciate cardiology consult - daily weights, strict intake and output  - Bumex 1 daly today  6. Dysuria C/o dysuria and unable to urinate. I+O ruled out Urinary retention.  -initially treated with Cipro 250 mg Q18 hours. Urine Cx NGTD - D/C cipro today   7. Leukocytosis, resolved  8. Hyponatremia, likely due to the recent history of inadequate oral intake and diarrhea  - correcting - follow BMP  9. COPD on 3 L O2 continous and Pulmonary HTN (moderate to severe), at baseline   10. DM 2 (8.6% 09/2012)  - hold home oral meds -Lantus 5 units and SSI--Good CBG control  11. DVT px  -Heparin sq  12. F/E/N  -Try to avoid IVF  -Will monitor and trend BMET and replace prn  -Carb mod diet    Dispo: Disposition is deferred at this time, awaiting improvement of current medical problems.  Anticipated discharge tomorrow. Will need to touch base with cardiology for D/C meds. Will need educate patient to have only cardiology titrate her cardiac meds.   The patient does have a current PCP (FUSCO,LAWRENCE J., MD), therefore is not requiring OPC follow-up after discharge.   The patient does not have transportation limitations that  hinder transportation to clinic appointments.  .Services Needed at time of discharge: Y = Yes, Blank = No PT:   OT:   RN:   Equipment:   Other:     LOS: 2 days   Precilla Purnell 12/27/2012, 9:19 AM

## 2012-12-27 NOTE — Care Management Note (Signed)
    Page 1 of 1   12/27/2012     12:51:20 PM   CARE MANAGEMENT NOTE 12/27/2012  Patient:  [name], [name]   Account Number:  [id number]  Date Initiated:  12/27/2012  Documentation initiated by:  GRAVES-BIGELOW,Mckenna Boruff  Subjective/Objective Assessment:   Pt admitted with Decreased urination and hypotension. Plan for home today.     Action/Plan:   CM will need HHPT and Aide for services. CM will fax orders to [facility] for services. SOC to begin within 24-48 hours post d/c.   Anticipated DC Date:  12/27/2012   Anticipated DC Plan:  HOME W HOME HEALTH SERVICES      DC Planning Services  CM consult      [facility] Choice  HOME HEALTH   Choice offered to / List presented to:  C-1 Patient        HH arranged  HH-2 PT  HH-4 NURSE'S AIDE      HH agency  [facility]   Status of service:  Completed, signed off Medicare Important Message given?   (If response is "NO", the following Medicare IM given date fields will be blank) Date Medicare IM given:   Date Additional Medicare IM given:    Discharge Disposition:  HOME W HOME HEALTH SERVICES  Per UR Regulation:  Reviewed for med. necessity/level of care/duration of stay  If discussed at Long Length of Stay Meetings, dates discussed:    Comments:

## 2012-12-28 LAB — BASIC METABOLIC PANEL
Chloride: 100 mEq/L (ref 96–112)
GFR calc Af Amer: 73 mL/min — ABNORMAL LOW (ref >90)
GFR calc non Af Amer: 63 mL/min — ABNORMAL LOW (ref >90)
Glucose, Bld: 126 mg/dL — ABNORMAL HIGH (ref 70–99)
Potassium: 4.1 mEq/L (ref 3.5–5.1)
Sodium: 136 mEq/L (ref 135–145)

## 2012-12-28 LAB — GLUCOSE, CAPILLARY: Glucose-Capillary: 228 mg/dL — ABNORMAL HIGH (ref 70–99)

## 2012-12-28 MED ORDER — CARVEDILOL 3.125 MG PO TABS: 6.2500 mg | ORAL_TABLET | Freq: Two times a day (BID) | ORAL | Status: DC

## 2012-12-28 MED ORDER — BUMETANIDE 1 MG PO TABS
1.0000 mg | ORAL_TABLET | Freq: Two times a day (BID) | ORAL | Status: DC
  Filled 2012-12-28 (×3): qty 1

## 2012-12-28 MED ORDER — LOSARTAN POTASSIUM 25 MG PO TABS: 25.0000 mg | ORAL_TABLET | Freq: Two times a day (BID) | ORAL | Status: DC

## 2012-12-28 MED ORDER — BUMETANIDE 2 MG PO TABS: 1.0000 mg | ORAL_TABLET | Freq: Two times a day (BID) | ORAL | Status: DC

## 2012-12-28 NOTE — Progress Notes (Signed)
Internal Medicine Teaching Service Attending Note Date: 12/28/2012  Patient name: [name]  Medical record number: [id number]  Date of birth: June 05, 1935    This patient has been seen and discussed with the house staff. Please see their note for complete details. I concur with their findings with the following additions/corrections:  Patient is clinically improved. Patient has tolerated adding back of her cardiac medications. Patient will be discharged home today with followup in [place].   Lars Mage 12/28/2012, 10:06 AM

## 2012-12-28 NOTE — Progress Notes (Signed)
Subjective: Patient denies sob, denies chest pain.  She is ready to go home and has a cardiology outpatient appointment Wednesday.    Objective: Vital signs in last 24 hours: Filed Vitals:   12/28/12 0605 12/28/12 0606 12/28/12 0607 12/28/12 0610  BP: 120/65 124/71 117/64 132/73  Pulse: 88 89 92 96  Temp: 98.5 F (36.9 C)     TempSrc: Oral     Resp: 18 18 20 20   Height:      Weight: 226 lb 8 oz (102.74 kg)     SpO2: 92%      Weight change: -1 lb 14.4 oz (-0.862 kg)  Intake/Output Summary (Last 24 hours) at 12/28/12 1134 Last data filed at 12/28/12 1000  Gross per 24 hour  Intake    880 ml  Output   2300 ml  Net  -1420 ml   Vitals reviewed.  General: resting in recliner, NAD  HEENT: [place]/at, no scleral icterus  Cardiac: RRR, no rubs, murmurs or gallops  Pulm:ctab Abd: soft, mild ttp LLQ at site sq shots ecchymosis noted there, nondistended, BS present  Ext: warm and well perfused, non pitting edema  Neuro: alert and oriented X3, grossly neurologically intact  Lab Results: Basic Metabolic Panel:  Recent Labs Lab 12/25/12 1405  12/27/12 0540 12/28/12 0500  NA  --   < > 134* 136  K  --   < > 4.5 4.1  CL  --   < > 102 100  CO2  --   < > 25 27  GLUCOSE  --   < > 118* 126*  BUN  --   < > 29* 23  CREATININE  --   < > 1.04 0.88  CALCIUM  --   < > 9.7 9.7  MG 1.7  --  2.2  --   < > = values in this interval not displayed. Liver Function Tests:  Recent Labs Lab 12/25/12 0742  AST 41*  ALT 28  ALKPHOS 154*  BILITOT 0.7  PROT 6.5  ALBUMIN 3.4*   CBC:  Recent Labs Lab 12/25/12 0742 12/26/12 0117 12/27/12 0540  WBC 14.2* 10.6* 7.9  NEUTROABS 11.4*  --  5.1  HGB 15.3* 14.0 14.5  HCT 45.2 40.6 43.1  MCV 88.1 86.6 87.8  PLT 140* 112* 120*   Cardiac Enzymes:  Recent Labs Lab 12/25/12 1405 12/25/12 1906 12/26/12 0117  TROPONINI 0.84* 0.99* 0.78*   BNP:  Recent Labs Lab 12/25/12 1906 12/27/12 0540 12/28/12 0500  PROBNP 6214.0* 4252.0* 3161.0*    CBG:  Recent Labs Lab 12/26/12 1637 12/26/12 2110 12/27/12 0649 12/27/12 1117 12/27/12 1619 12/27/12 2108  GLUCAP 181* 149* 118* 156* 147* 152*   Urinalysis:  Recent Labs Lab 12/25/12 0725  COLORURINE AMBER*  LABSPEC 1.023  PHURINE 5.0  GLUCOSEU 100*  HGBUR NEGATIVE  BILIRUBINUR MODERATE*  KETONESUR 15*  PROTEINUR NEGATIVE  UROBILINOGEN 1.0  NITRITE NEGATIVE  LEUKOCYTESUR NEGATIVE   Micro Results: Recent Results (from the past 240 hour(s))  URINE CULTURE     Status: None   Collection Time    12/25/12  7:25 AM      Result Value Range Status   Specimen Description URINE, CATHETERIZED   Final   Special Requests CX ADDED 5/2 1345   Final   Culture  Setup Time 12/25/2012 16:44   Final   Colony Count NO GROWTH   Final   Culture NO GROWTH   Final   Report Status 12/26/2012 FINAL   Final  Studies/Results: No results found. Medications: I have reviewed the patient's current medications. Scheduled Meds: . aspirin  81 mg Oral Daily  . brimonidine  1 drop Both Eyes BID  . bumetanide  1 mg Oral BID  . carvedilol  6.25 mg Oral BID WC  . ezetimibe  10 mg Oral QHS  . heparin  5,000 Units Subcutaneous Q8H  . insulin aspart  0-5 Units Subcutaneous QHS  . insulin aspart  0-9 Units Subcutaneous TID WC  . insulin glargine  5 Units Subcutaneous QHS  . levothyroxine  150 mcg Oral QAC breakfast  . loratadine  10 mg Oral Daily  . losartan  25 mg Oral BID  . omeprazole  20 mg Oral q morning - 10a  . simvastatin  10 mg Oral q1800  . sodium chloride  3 mL Intravenous Q12H  . sodium chloride  3 mL Intravenous Q12H  . Travoprost (BAK Free)  1 drop Both Eyes BID   Continuous Infusions:  PRN Meds:.sodium chloride, acetaminophen, ALPRAZolam, ondansetron (ZOFRAN) IV, ondansetron, oxyCODONE-acetaminophen, sodium chloride Assessment/Plan:  78 y.o with significant medical history presents with urinary retention, hypotension found to have AKI and elevated troponin.   1.  Orthostatic hypotension with history of HTN , resolved - slowly medication titration - Coreg 6.25 bid and Losartan 35 bid,  Bumex 1 mg bid (this will be titrated up outpatient) per cards, resumed Cardiazem at d/c   2. Acute on CKD, resolved -likely prerenal  3. Dyspnea on breath with exertion- at baseline on 3 L [place]  4. Elevated troponin, likely multifactorial related CHF exacerbation, acute worsening of renal function, chronic cor pulmonale -0.84 on admission. Trop 0.84-->0.99-->0.78. -appreciate cardiology inputs.   5. Chronic congestive heart failure (right heart failure secondary to pulmonary HTN), dry weight 213 lbs  -Weight up to 228 now down to 226. BNP initially at ~6000 and now trending down ~3100 - Patient SOB at baseline.  - appreciate cardiology consult - daily weights, strict intake and output  - Bumex 1 bid   6. Dysuria, resolved  -C/o dysuria and unable to urinate on admission. I+O ruled out Urinary retention.  -initially treated with Cipro 250 mg Q18 hours. Urine Cx NGTD -D/C cipro after 2 doses and she was given Rocephin in the ED  7. Hyponatremia, resolved  8. COPD on 3 L O2 continous and Pulmonary HTN (moderate to severe), at baseline   9. DM 2 (8.6% 09/2012)  -hold home oral meds until d/c  -Lantus 5 units and SSI--Good CBG control  10. DVT px  -Heparin sq   11. F/E/N  -Try to avoid IVF  -Will monitor and trend BMET and replace prn  -Carb mod diet    Dispo: dc today home   The patient does have a current PCP (FUSCO,LAWRENCE J., MD), therefore is not requiring OPC follow-up after discharge.   The patient does not have transportation limitations that hinder transportation to clinic appointments.  .Services Needed at time of discharge: Y = Yes, Blank = No PT: Home PT  OT:   RN:   Equipment: Rolling walker   Other:     LOS: 3 days   Annett Gula [phone] 12/28/2012, 11:34 AM

## 2012-12-28 NOTE — Progress Notes (Signed)
Resp Care Note,Pt refusing CPAP.

## 2012-12-28 NOTE — Progress Notes (Signed)
[facility]  Subjective: No SOB at rest, but gets dyspneic after ambulating the halls.   Objective: Vital signs in last 24 hours: Temp:  [97.3 F (36.3 C)-98.5 F (36.9 C)] 98.5 F (36.9 C) (05/05 0605) Pulse Rate:  [87-96] 96 (05/05 0610) Resp:  [18-20] 20 (05/05 0610) BP: (114-132)/(64-75) 132/73 mmHg (05/05 0610) SpO2:  [92 %-93 %] 92 % (05/05 0605) Weight:  [226 lb 8 oz (102.74 kg)] 226 lb 8 oz (102.74 kg) (05/05 0605) Last BM Date: 12/26/12  Intake/Output from previous day: 05/04 0701 - 05/05 0700 In: 963 [P.O.:960; I.V.:3] Out: 2150 [Urine:2150] Intake/Output this shift: Total I/O In: 240 [P.O.:240] Out: 200 [Urine:200]  Medications Current Facility-Administered Medications  Medication Dose Route Frequency Provider Last Rate Last Dose  . 0.9 %  sodium chloride infusion  250 mL Intravenous PRN Na Li, MD      . acetaminophen (TYLENOL) tablet 650 mg  650 mg Oral Q6H PRN Annett Gula, MD      . ALPRAZolam Prudy Feeler) tablet 0.5 mg  0.5 mg Oral QID PRN Dede Query, MD   0.5 mg at 12/27/12 2116  . aspirin EC tablet 81 mg  81 mg Oral Daily Na Li, MD   81 mg at 12/27/12 1116  . brimonidine (ALPHAGAN) 0.2 % ophthalmic solution 1 drop  1 drop Both Eyes BID Lars Mage, MD   1 drop at 12/27/12 2112  . bumetanide (BUMEX) tablet 1 mg  1 mg Oral Daily Lennette Bihari, MD   1 mg at 12/27/12 1123  . carvedilol (COREG) tablet 6.25 mg  6.25 mg Oral BID WC Lennette Bihari, MD   6.25 mg at 12/28/12 0617  . ezetimibe (ZETIA) tablet 10 mg  10 mg Oral QHS Na Li, MD   10 mg at 12/27/12 2113  . heparin injection 5,000 Units  5,000 Units Subcutaneous Q8H Dede Query, MD   5,000 Units at 12/27/12 [phone]  . insulin aspart (novoLOG) injection 0-5 Units  0-5 Units Subcutaneous QHS Dede Query, MD   2 Units at 12/25/12 2241  . insulin aspart (novoLOG) injection 0-9 Units  0-9 Units Subcutaneous TID WC Na Li, MD   1 Units at 12/28/12 [phone]  . insulin glargine (LANTUS) injection 5 Units  5 Units  Subcutaneous QHS Dede Query, MD   5 Units at 12/27/12 2121  . levothyroxine (SYNTHROID, LEVOTHROID) tablet 150 mcg  150 mcg Oral QAC breakfast Na Li, MD   150 mcg at 12/28/12 0617  . loratadine (CLARITIN) tablet 10 mg  10 mg Oral Daily Na Li, MD   10 mg at 12/27/12 1116  . losartan (COZAAR) tablet 25 mg  25 mg Oral BID Lennette Bihari, MD   25 mg at 12/27/12 2119  . omeprazole (PRILOSEC) capsule 20 mg  20 mg Oral q morning - 10a Lars Mage, MD   20 mg at 12/27/12 1117  . ondansetron (ZOFRAN) tablet 4 mg  4 mg Oral Q6H PRN Na Li, MD       Or  . ondansetron (ZOFRAN) injection 4 mg  4 mg Intravenous Q6H PRN Na Li, MD      . oxyCODONE-acetaminophen (PERCOCET/ROXICET) 5-325 MG per tablet 1 tablet  1 tablet Oral Q4H PRN Lollie Sails, MD   1 tablet at 12/26/12 2139  . simvastatin (ZOCOR) tablet 10 mg  10 mg Oral q1800 Na Li, MD   10 mg at 12/27/12 1719  . sodium chloride 0.9 % injection  3 mL  3 mL Intravenous Q12H Na Li, MD      . sodium chloride 0.9 % injection 3 mL  3 mL Intravenous Q12H Na Li, MD   3 mL at 12/27/12 2122  . sodium chloride 0.9 % injection 3 mL  3 mL Intravenous PRN Na Dierdre Searles, MD      . Travoprost (BAK Free) (TRAVATAN) 0.004 % ophthalmic solution SOLN 1 drop  1 drop Both Eyes BID Na Li, MD   1 drop at 12/27/12 2112    PE: General appearance: alert, cooperative, no distress and moderately obese Lungs: faint bibasilar crackles, L > R Heart: regular rate and rhythm Extremities: no LEE Pulses: 2+ and symmetric Skin: warm and dry Neurologic: Grossly normal  Lab Results:   Recent Labs  12/26/12 0117 12/27/12 0540  WBC 10.6* 7.9  HGB 14.0 14.5  HCT 40.6 43.1  PLT 112* 120*   BMET  Recent Labs  12/26/12 0117 12/27/12 0540 12/28/12 0500  NA 129* 134* 136  K 4.5 4.5 4.1  CL 97 102 100  CO2 22 25 27   GLUCOSE 173* 118* 126*  BUN 33* 29* 23  CREATININE 1.49* 1.04 0.88  CALCIUM 9.0 9.7 9.7   BNP (last 3 results)  Recent Labs  12/25/12 1906 12/27/12 0540 12/28/12 0500   PROBNP 6214.0* 4252.0* 3161.0*    Assessment/Plan  Principal Problem:   Hypotension Active Problems:   Chronic respiratory failure with hypoxia   DM2 (diabetes mellitus, type 2)   Elevated troponin - most likely related to Cor Pulmonale.   Unspecified hypothyroidism   Normal coronary arteries at cath 2011   Obesity, morbid (more than 100 lbs over ideal weight or BMI > 40)   Pulmonary hypertension, moderate to severe; Severe by Echo 09/2012   Diastolic CHF, chronic   UTI (urinary tract infection)   Hypotension, unspecified   Acute renal failure   Dyspnea on exertion   Urinary retention   Leukocytosis, unspecified   Dysuria  Plan: BP has improved. No further hypotension. Coreg was resumed the other day. Pt tolerating well. Renal function is back to baseline. Low dose Losartan, 25 mg restarted.Pt's home dose is 100 mg daily. Will need to titrate up. BNP was 4,252 yesterday. Bumex was restarted, but at a low dose of 1 mg (home dose is 3 mg). BNP today is improved to 3,161. SOB has improved. The patient's weight today is 226 lbs. Her dry weight is ~216 pounds. Will need to slowly titrate up diuretic as BP allows. Pt can likely be discharged home today, with upward titration of diuretic as an OP. Pt states that she has a follow-up appointment in [place] already scheduled for this Wednesday.     LOS: 3 days    Brittainy M. Delmer Islam 12/28/2012 9:22 AM    Patient seen and examined. Agree with assessment and plan. Pt has tolerated increased losartan to 25 mg bid yesterday, and resumption of increased coreg and bumex. Cr 0.88 today. Would keep off diltiazem. Titrate bumex to 1 mg bid today. Further titrate carvedilol and losartan as outpatient.   Lennette Bihari, MD, [facility] 12/28/2012 10:15 AM

## 2012-12-28 NOTE — Progress Notes (Addendum)
Physical Therapy Treatment Patient Details Name: [name] MRN: [id number] DOB: 12-05-1934 Today's Date: 12/28/2012 Time: 4098-1191 PT Time Calculation (min): 23 min  PT Assessment / Plan / Recommendation Comments on Treatment Session  Pt making progress with mobility.  Able to increase ambulation distance but still present with SOB; pt reports she feels she is at baseline with her activity tolerance. Pt did desat to 84% 3L when returned to room from ambulating.  Cont'd to reitierate importance pacing herself with activities.      Follow Up Recommendations  Home health PT [facility] Aide)     Does the patient have the potential to tolerate intense rehabilitation     Barriers to Discharge        Equipment Recommendations  Rolling walker with 5" wheels    Recommendations for Other Services OT consult  Frequency Min 3X/week   Plan Discharge plan remains appropriate    Precautions / Restrictions Restrictions Weight Bearing Restrictions: No   Pertinent Vitals/Pain Pt on 3L 02 entire session.  91% 3L upon arrival.  Desated to 84% 3L with ambulation. 94% 3L at end of session.      Mobility  Bed Mobility Bed Mobility: Not assessed Transfers Transfers: Sit to Stand;Stand to Sit Sit to Stand: 6: Modified independent (Device/Increase time);With armrests;From chair/3-in-1;With upper extremity assist Stand to Sit: 6: Modified independent (Device/Increase time);With upper extremity assist;With armrests;To chair/3-in-1 Ambulation/Gait Ambulation/Gait Assistance: 4: Min guard Ambulation Distance (Feet): 80 Feet Assistive device: Rolling walker Ambulation/Gait Assistance Details: Cues for pursed lip breathing, self monitoring of activity tolerance & need for rest break.  Pt reports she feels she is at baseline with mobility & her activity tolerance.   Gait Pattern: Step-through pattern;Decreased stride length Stairs: No Wheelchair Mobility Wheelchair Mobility: No    Exercises General  Exercises - Lower Extremity Long Arc Quad: AROM;Both;15 reps;Strengthening;Seated Hip Flexion/Marching: Both;15 reps;Seated Toe Raises: Both;15 reps Heel Raises: AROM;15 reps;Seated     PT Goals Acute Rehab PT Goals Time For Goal Achievement: 01/09/13 Potential to Achieve Goals: Good Pt will go Supine/Side to Sit: Independently Pt will go Sit to Supine/Side: Independently Pt will go Sit to Stand: Independently PT Goal: Sit to Stand - Progress: Progressing toward goal Pt will go Stand to Sit: Independently PT Goal: Stand to Sit - Progress: Progressing toward goal Pt will Ambulate: >150 feet;with modified independence PT Goal: Ambulate - Progress: Progressing toward goal  Visit Information  Last PT Received On: 12/28/12 Assistance Needed: +1    Subjective Data      Cognition  Cognition Arousal/Alertness: Awake/alert Behavior During Therapy: WFL for tasks assessed/performed Overall Cognitive Status: Within Functional Limits for tasks assessed    Balance  Static Standing Balance Static Standing - Balance Support: No upper extremity supported Static Standing - Level of Assistance: 6: Modified independent (Device/Increase time) Static Standing - Comment/# of Minutes: 2 mins  End of Session PT - End of Session Equipment Utilized During Treatment: Oxygen Activity Tolerance: Patient tolerated treatment well Patient left: in chair;with call bell/phone within reach Nurse Communication: Mobility status     Verdell Face, [place] [phone] 12/28/2012

## 2012-12-28 NOTE — Discharge Summary (Signed)
Internal Medicine Teaching [facility] Discharge Note  Name: RITIKA HELLICKSON MRN: [id number] DOB: 03/02/1935 78 y.o.  Date of Admission: 12/25/2012  6:39 AM Date of Discharge: 12/28/2012 Attending Physician: Lars Mage, MD  Discharge Diagnosis: 1. Orthostatic Hypotension 2. Acute on chronic renal failure 3. Chronic respiratory failure with hypoxia, history of COPD on 3 L continuous O2, and dyspnea on exertion 4. Elevated troponin - most likely related to Cor Pulmonale 5. Chronic Right sided heart failure secondary to cor pulmonale and diastolic dysfunction (grade 1) 6. Urinary retention 7. Dysuria with concern for UTI 8.  Leukocytosis, unspecified 9. Pulmonary hypertension, moderate to severe; Severe by Echo 09/2012 likely due to noncompliance with cpap for OSA 10.  DM2 (diabetes mellitus, type 2) 11. Resolved hyponatremia   Discharge Medications:   Medication List    TAKE these medications       ALLERGY RELIEF 180 MG tablet  Generic drug:  fexofenadine  Take 180 mg by mouth daily.     ALPHAGAN P OP  Place 1 drop into both eyes 2 (two) times daily.     ALPRAZolam 0.5 MG tablet  Commonly known as:  XANAX  Take 0.5 mg by mouth 4 (four) times daily as needed for anxiety.     aspirin 81 MG EC tablet  Take 81 mg by mouth daily.     bumetanide 2 MG tablet  Commonly known as:  BUMEX  Take 0.5 tablets (1 mg total) by mouth 2 (two) times daily.     carvedilol 3.125 MG tablet  Commonly known as:  COREG  Take 2 tablets (6.25 mg total) by mouth 2 (two) times daily with a meal.     diltiazem 180 MG 24 hr capsule  Commonly known as:  CARDIZEM CD  Take 180 mg by mouth daily.     docusate sodium 50 MG capsule  Commonly known as:  COLACE  Take 50 mg by mouth 2 (two) times daily.     ezetimibe 10 MG tablet  Commonly known as:  ZETIA  Take 10 mg by mouth at bedtime.     fish oil-omega-3 fatty acids 1000 MG capsule  Take 1 g by mouth 2 (two) times daily.     glipiZIDE 10  MG tablet  Commonly known as:  GLUCOTROL  Take 10 mg by mouth 2 (two) times daily before a meal.     lansoprazole 30 MG capsule  Commonly known as:  PREVACID  Take 30 mg by mouth daily.     levothyroxine 150 MCG tablet  Commonly known as:  SYNTHROID, LEVOTHROID  Take 150 mcg by mouth daily.     linagliptin 5 MG Tabs tablet  Commonly known as:  TRADJENTA  Take 5 mg by mouth daily.     losartan 25 MG tablet  Commonly known as:  COZAAR  Take 1 tablet (25 mg total) by mouth 2 (two) times daily.     oxyCODONE-acetaminophen 5-325 MG per tablet  Commonly known as:  PERCOCET/ROXICET  Take 1 tablet by mouth every 4 (four) hours as needed for pain.     potassium chloride SA 20 MEQ tablet  Commonly known as:  K-DUR,KLOR-CON  Take 20 mEq by mouth 2 (two) times daily.     pravastatin 20 MG tablet  Commonly known as:  PRAVACHOL  Take 20 mg by mouth at bedtime.     Travoprost (BAK Free) 0.004 % Soln ophthalmic solution  Commonly known as:  TRAVATAN  Place 1 drop into both  eyes 2 (two) times daily.        Disposition and follow-up:   Ms.Chudney T Brum was discharged from [facility] in stable condition.  At the hospital follow up visit please address  1) Repeat BMET, CBC 2) Per cardiology will titrate up Bumex outpatient   Follow-up Appointments:  Discharge Orders   Future Orders Complete By Expires     Diet - low sodium heart healthy  As directed     Discharge instructions  As directed     Comments:      Please follow with Cardiology 12/30/12 scheduled appointment  Please follow with Dr. Sherwood Gambler 01/04/13 at 9:30 am [phone] Please see medication changes and read over information  Pleasure taking care of you this hospital stay    Increase activity slowly  As directed        Consultations: Treatment Team:  Chrystie Nose, MD, Dr. Tresa Endo  [facility] Cardiology    Procedures Performed:  Dg Chest 2 View  12/25/2012  *RADIOLOGY REPORT*  Clinical Data:  78 year old female with shortness of breath, weakness, difficulty urinating.  CHEST - 2 VIEW  Comparison: 09/22/2012 and earlier.  Findings: Stable lung volumes. Stable cardiomegaly and mediastinal contours.  Pulmonary vascular congestion with prominent central pulmonary vasculature is stable.  Linear areas of scarring atelectasis greater in the left lung also appears stable.  No pneumothorax.  No pleural effusion or consolidation. Visualized tracheal air column is within normal limits.  Stable visualized osseous structures.  IMPRESSION: Pulmonary vascular congestion stable to mildly increased such that mild interstitial edema may be present.  Otherwise stable chest with cardiomegaly and atelectasis.   Original Report Authenticated By: Erskine Speed, M.D.     2D Echo:  10/14/12 Study Conclusions  - Left ventricle: The cavity size was normal. Wall thickness was increased in a pattern of moderate LVH. There was moderate concentric hypertrophy. Systolic function was normal. The estimated ejection fraction was in the range of 55% to 65%. Although no diagnostic regional wall motion abnormality was identified, this possibility cannot be completely excluded on the basis of this study. Doppler parameters are consistent with abnormal left ventricular relaxation (grade 1 diastolic dysfunction). - Ventricular septum: These changes are consistent with RV volume and pressure overload. - Aortic valve: Mildly calcified annulus. Trileaflet; normal thickness, mildly calcified leaflets. - Mitral valve: Calcified annulus. Mildly thickened leaflets . - Right ventricle: The cavity size was severely dilated. Wall thickness was normal. Systolic function was mildly reduced. The estimated ejection fraction was , by visual assessment. - Right atrium: The atrium was moderately to severely dilated. - Pulmonary arteries: PA peak pressure: 78mm Hg (S). Impressions:  - Consistent with cor pulmonale, with elevated  right-sided filling pressure and normal left-sided filling pressure. Pulmonary hypertension, with mild RV failure, due to primary pulmonary hypertension. Transthoracic echocardiography. M-mode, complete 2D, spectral Doppler, and color Doppler. Height: Height: 154.9cm. Height: 61in. Weight: Weight: 112.5kg. Weight: 247.5lb. Body mass index: BMI: 46.9kg/m^2. Body surface area: BSA: 2.44m^2. Blood pressure: 128/79. Patient status: Inpatient. Location: ICU/CCU     Cardiac Cath: none   Admission HPI:  Chief Complaint: Decreased urination  History of Present Illness:  78 y.o with significant cardiac and pulmonary medical history presented for decreased urination x 24 hours. She has had decreased urination despite having increased Bumex from 2 mg bid to 3 mg bid recently. She also reports her weight has increased recently (dry weight ~213 per patient) associated with sob with exertion, abdominal distension (though  abdominal distension improved within the last 2 weeks). Shortness of breath has been chronic x at least 2 years though worsening x 2 weeks and she can only walk a few steps before feeling short of breath. At home she is on home O2 3L continuously. She was found to be hypotensive on admission. Of note she had recent resolution of multiple bouts of diarrhea for 3 days. Diarrhea resolved 12/24/12. Diarrhea was associated with nausea which is also resolved. Other review of systems positive cold chills, dysuria noted 1 day prior to admission, abdominal pain (resolved, previously suprapubic and 9/10 without radiation no 0/10), mild epigastric pain x years, gait problem (supposed to walk with a walker), resolved lightheadedness (noted last night and this morning). She denies sick contacts.  Admission Physical Exam:  VS 95, 85-90 on 4L O2, 23, 89/49 (59)-->101/62 (69) (per patient baseline sbp 120s)  Blood pressure 107/56, pulse 93, temperature 98.4 F (36.9 C), temperature source Rectal, resp. rate  19, height 5\' 1"  (1.549 m), weight 227 lb 8.2 oz (103.2 kg), SpO2 88.00%.  Vitals reviewed.  General: resting in bed, NAD, alert and oriented x 3  HEENT: PERRL b/l, no scleral icterus, chewing tobacco in mouth  Cardiac: RRR, no rubs, murmurs or gallops  Pulm: fine crackles b/l  Abd: soft, obese, mild ttp epigastric region, nondistended, BS present  Ext: cool to touch feet (chronic issue), 1+ edema, intact distal pulses  Neuro: alert and oriented X3, moving all 4 extremities, neurologically intact  Skin: intact   Review of Systems:  General: +cold chills, denies fever, appetite normal, weight increase (dry weight ~213 per patient weight 5/1 was 219 and weight on admission 5/2 was 225)  HEENT: denies dysphagia  Cardiac: denies chest pain, heartburn  Pulm: +sob with exertion (chronic x at least 2 years though worsening x 2 weeks, on home O2 3L), denies orthopnea  Abd/GU: +dysuria noted 1 day prior to admission), +decreased urination x 24 hours prior to admission, abdominal pain (resolved, previously suprapubic and 9/10 without radiation), mild epigastric pain x years, +abdominal distension (though improved within 2 weeks); denies blood in urine or stool, resolved diarrhea (noted x 3 days with multiple bouts of diarrhea and nausea now both resolved and stopped 12/24/12), denies constipation, denies vomiting  Ext: denies swelling in lower extremities  Neuro: +gait problem (supposed to walk with a walker), resolved lightheadedness (noted last night and this morning)  Other: denies sick contacts    Hospital Course by problem list: 1. Orthostatic Hypotension 2. Acute on chronic renal failure 3. Chronic respiratory failure with hypoxia, history of COPD on 3 L continuous O2, and dyspnea on exertion 4. Elevated troponin - most likely related to Cor Pulmonale 5. Chronic Right sided heart failure secondary to cor pulmonale and diastolic dysfunction (grade 1) 6. Urinary retention 7. Dysuria with concern  for UTI 8.  Leukocytosis, unspecified 9. Pulmonary hypertension, moderate to severe; Severe by Echo 09/2012 likely due to noncompliance with cpap for OSA 10.  DM2 (diabetes mellitus, type 2) 11. Resolved hyponatremia   78 year old with significant medical history presents with urinary retention, hypotension found to have acute renal failure and elevated troponin on admission   1. Orthostatic hypotension with history of hypertension Blood pressure was 85/38 on admission. Likely multifactorial due to volume depletion (prior to admission 3 days of diarrhea which stopped 12/24/12) and recent increased in dosage of diuretics (patient was taking Bumex 3 mg bid just increased from 2 mg bid within the last  few weeks).  She was bolused 3L normal saline in the ED. We avoided further fluids due to history of heart failure and pulmonary hypertension.  We held her home Losartan, Bumex, Cardiazem, Coreg.  She was orthostatic on admission though the vitals were not recorded incorrectly in documentation.  Her sitting to standing systolic blood pressure went from 96 to 99 though recorded lying 81/47 (HR 94) lying, 66/37 (HR 97 sitting), 96/59 (HR 98) standing.  We ordered orthostatics every shift.  We slowly resumed her home blood pressure medications and diuretics.  Coreg 3.125 mg bid to 6.25 bid and Losartan 25 bid, Bumex 1 mg bid (this will be titrated up outpatient) per cardiology, resumed Cardiazem at discharge.    2. Acute on chronic kidney disease  Creatine 2.00 on admission.  Baseline ~1.0-1.3. Likely prerenal due to volume depletion for GI loss (diarrhea) and diuretics.  She had decreased urination prior to admission for 24 hours despite having increased Bumex from 2 mg to 3 mg bid recently. An in an out catheter was done in the ED with 50 mL of fluid and the patient voided after catherization with no problem.  Etiology was likely prerenal due to Losartan, Bumex, and diarrhea.  Renal failure resolved by discharge.     3.Chronic respiratory failure with hypoxia, history of COPD on 3 L continuous O2, and dyspnea on exertion Etiology unclear. Could multifactorial be cardiac or pulmonary as patient has chronic right heart failure due to pulmonary hypertension, obesity, COPD.  Trended cardiac enzymes. By discharge she was on her home O2 3 L continuous.   4. Elevated troponin Troponin 0.84-->0.99-->0.78. Likely multifactorial related fluid overload this admission and possible CHF exacerbation, acute worsening of renal function, chronic cor pulmonale due to pulmonary hypertension, age.  [facility] Cardiology saw the patient this admission and thought elevation less likely due to ischemia and likely secondary to cor pulmonale, pulmonary hypertension. She has had elevated troponins in 09/2012 0.4-0.8 during an admission for heart failure   5. Chronic right congestive heart failure (secondary to pulmonary hypertension) and diastolic dysfunction (grade 1), dry weight 213 lbs  Patient had normal cardiac cath in 2011. Last admission was 09/2012. Her cardiologist is Dr. Rennis Golden who she recently saw ~2 weeks ago and her Bumex dose was increased from 2 mg bid to 3 mg bid. Per patient dry weight is 213. Weight 5/1 was 219 and weight on admission 5/2 was 225 pounds. ProBNP was 6214.0 on 5/2, 4252.0 on 5/4 and 3161.0 on 12/28/12 likely due to fluid overload while in the ED this admission.  Patient did not have complaints of chest pain.  We consulted Cardiology, did daily weights, strict intake and output.  We resumed her Losartan at a lower dose 25 mg bid, Bumex 1 mg bid at a lower dose (cardiology will titrate up outpatient), Cardiazem (resumed at discharge), Coreg 3.125 mg bid then 6.25 mg bid by discharge.  Discharge weight was 226 pounds.    6. Urinary retention  Noted on admission. In and out cath in the ED with 50 cc of urine out and patient voided without difficulty after in and out catheter.   7. Dysuria, resolved  Initially  treated with Rocephin in the ED x 1 dose.  She was then given Cipro 250 mg Q18 hours x 3 doses. Urine Culture was no growth to date.     8. Leukocytosis, resolved  WBC 14.2 on admission by discharge normalized. Could be due to acute stress no growth on  urine culture.     9. Pulmonary HTN (moderate to severe) Likely due to OSA noncompliant with cpap at home and this admission.   10. DM 2 (8.6% 09/2012)  Held home oral medications until discharge.  We started Lantus 5 units and sliding scale insulin.    11. Hyponatremia, resolved   She was given Heparin subcutaneous for DVT prophylaxis.  Though she intermittently refused.    Discharge Vitals:  BP 132/73  Pulse 96  Temp(Src) 98.5 F (36.9 C) (Oral)  Resp 20  Ht 5\' 1"  (1.549 m)  Wt 226 lb 8 oz (102.74 kg)  BMI 42.82 kg/m2  SpO2 92%  Discharge physical exam: Vitals reviewed.  General: resting in recliner, NAD  HEENT: [place]/at, no scleral icterus  Cardiac: RRR, no rubs, murmurs or gallops  Pulm:ctab  Abd: soft, mild ttp LLQ at site sq shots ecchymosis noted there, nondistended, BS present  Ext: warm and well perfused, non pitting edema  Neuro: alert and oriented X3, grossly neurologically intact  Discharge Labs:  Results for OYINDAMOLA, KEY (MRN [id number]) as of 12/28/2012 11:59  Ref. Range 12/25/2012 07:42 12/25/2012 14:05 12/26/2012 01:17 12/27/2012 05:40 12/28/2012 05:00  Sodium Latest Range: 135-145 mEq/L 135  129 (L) 134 (L) 136  Potassium Latest Range: 3.5-5.1 mEq/L 5.1  4.5 4.5 4.1  Chloride Latest Range: 96-112 mEq/L 96  97 102 100  CO2 Latest Range: 19-32 mEq/L 25  22 25 27   BUN Latest Range: 6-23 mg/dL 32 (H)  33 (H) 29 (H) 23  Creatinine Latest Range: 0.50-1.10 mg/dL 4.09 (H)  8.11 (H) 9.14 0.88  Calcium Latest Range: 8.4-10.5 mg/dL 9.5  9.0 9.7 9.7  GFR calc non Af Amer Latest Range: >90 mL/min 23 (L)  33 (L) 51 (L) 63 (L)  GFR calc Af Amer Latest Range: >90 mL/min 27 (L)  38 (L) 59 (L) 73 (L)  Glucose Latest Range: 70-99  mg/dL 782 (H)  956 (H) 213 (H) 126 (H)  Magnesium Latest Range: 1.5-2.5 mg/dL  1.7  2.2   Alkaline Phosphatase Latest Range: 39-117 U/L 154 (H)      Albumin Latest Range: 3.5-5.2 g/dL 3.4 (L)      AST Latest Range: 0-37 U/L 41 (H)      ALT Latest Range: 0-35 U/L 28      Total Protein Latest Range: 6.0-8.3 g/dL 6.5      Total Bilirubin Latest Range: 0.3-1.2 mg/dL 0.7       Results for CHARIZMA, GARDINER (MRN [id number]) as of 12/28/2012 11:59  Ref. Range 12/25/2012 14:05 12/25/2012 19:06 12/26/2012 01:17 12/27/2012 05:40 12/28/2012 05:00  Troponin I Latest Range: <0.30 ng/mL 0.84 (HH) 0.99 (HH) 0.78 (HH)    Pro B Natriuretic peptide (BNP) Latest Range: 0-450 pg/mL  6214.0 (H)  4252.0 (H) 3161.0 (H)  Results for SAVI, LASTINGER (MRN [id number]) as of 12/28/2012 11:59  Ref. Range 12/25/2012 07:50  Lactic Acid, Venous Latest Range: 0.5-2.2 mmol/L 1.99   Results for CATHRINE, KRIZAN (MRN [id number]) as of 12/28/2012 11:59  Ref. Range 12/25/2012 07:42 12/26/2012 01:17 12/27/2012 05:40  WBC Latest Range: 4.0-10.5 K/uL 14.2 (H) 10.6 (H) 7.9  RBC Latest Range: 3.87-5.11 MIL/uL 5.13 (H) 4.69 4.91  Hemoglobin Latest Range: 12.0-15.0 g/dL 53.6 (H) 64.4 03.4  HCT Latest Range: 36.0-46.0 % 45.2 40.6 43.1  MCV Latest Range: 78.0-100.0 fL 88.1 86.6 87.8  MCH Latest Range: 26.0-34.0 pg 29.8 29.9 29.5  MCHC Latest Range: 30.0-36.0 g/dL 74.2 59.5 63.8  RDW Latest Range: 11.5-15.5 %  14.6 14.7 14.2  Platelets Latest Range: 150-400 K/uL 140 (L) 112 (L) 120 (L)  Neutrophils Relative Latest Range: 43-77 % 81 (H)  65  Lymphocytes Relative Latest Range: 12-46 % 10 (L)  22  Monocytes Relative Latest Range: 3-12 % 9  10  Eosinophils Relative Latest Range: 0-5 % 0  2  Basophils Relative Latest Range: 0-1 % 0  0  NEUT# Latest Range: 1.7-7.7 K/uL 11.4 (H)  5.1  Lymphocytes Absolute Latest Range: 0.7-4.0 K/uL 1.4  1.7  Monocytes Absolute Latest Range: 0.1-1.0 K/uL 1.3 (H)  0.8  Eosinophils Absolute Latest Range: 0.0-0.7 K/uL 0.0  0.2   Basophils Absolute Latest Range: 0.0-0.1 K/uL 0.0  0.0   Results for ARTHELLA, HEADINGS (MRN [id number]) as of 12/28/2012 11:59  Ref. Range 12/25/2012 07:25 12/25/2012 22:44 12/25/2012 22:45  Color, Urine Latest Range: YELLOW  AMBER (A)    APPearance Latest Range: CLEAR  TURBID (A)    Specific Gravity, Urine Latest Range: 1.005-1.030  1.023    pH Latest Range: 5.0-8.0  5.0    Glucose Latest Range: NEGATIVE mg/dL 782 (A)    Bilirubin Urine Latest Range: NEGATIVE  MODERATE (A)    Ketones, ur Latest Range: NEGATIVE mg/dL 15 (A)    Protein Latest Range: NEGATIVE mg/dL NEGATIVE    Urobilinogen, UA Latest Range: 0.0-1.0 mg/dL 1.0    Nitrite Latest Range: NEGATIVE  NEGATIVE    Leukocytes, UA Latest Range: NEGATIVE  NEGATIVE    Hgb urine dipstick Latest Range: NEGATIVE  NEGATIVE    Urine-Other No range found AMORPHOUS URATES/PHOSPHATES    Squamous Epithelial / LPF Latest Range: RARE  RARE    Bacteria, UA Latest Range: RARE  FEW (A)    Crystals Latest Range: NEGATIVE  CA OXALATE CRYSTALS (A)    Sodium, Ur No range found  <10   Urea Nitrogen, Ur No range found   558   Specimen Description  URINE, CATHETERIZED   Special Requests  CX ADDED 5/2 1345   Culture Setup Time  12/25/2012 16:44   Colony Count  NO GROWTH   Culture  NO GROWTH   Report Status  12/26/2012 FINAL   Results for MAKENAH, KARAS (MRN [id number]) as of 12/29/2012 19:49  Ref. Range 12/28/2012 05:00  Sodium Latest Range: 135-145 mEq/L 136  Potassium Latest Range: 3.5-5.1 mEq/L 4.1  Chloride Latest Range: 96-112 mEq/L 100  CO2 Latest Range: 19-32 mEq/L 27  BUN Latest Range: 6-23 mg/dL 23  Creatinine Latest Range: 0.50-1.10 mg/dL 5.78  Calcium Latest Range: 8.4-10.5 mg/dL 9.7  GFR calc non Af Amer Latest Range: >90 mL/min 63 (L)  GFR calc Af Amer Latest Range: >90 mL/min 73 (L)  Glucose Latest Range: 70-99 mg/dL 469 (H)   Results for ECE, CUMBERLAND (MRN [id number]) as of 12/29/2012 19:49  Ref. Range 12/28/2012 05:00  Pro B  Natriuretic peptide (BNP) Latest Range: 0-450 pg/mL 3161.0 (H)  Results for OPIE, FANTON (MRN [id number]) as of 12/29/2012 19:49  Ref. Range 12/27/2012 05:40  WBC Latest Range: 4.0-10.5 K/uL 7.9  RBC Latest Range: 3.87-5.11 MIL/uL 4.91  Hemoglobin Latest Range: 12.0-15.0 g/dL 53.6  HCT Latest Range: 36.0-46.0 % 43.1  MCV Latest Range: 78.0-100.0 fL 87.8  MCH Latest Range: 26.0-34.0 pg 29.5  MCHC Latest Range: 30.0-36.0 g/dL 64.4  RDW Latest Range: 11.5-15.5 % 14.2  Platelets Latest Range: 150-400 K/uL 120 (L)    Signed: Annett Gula 12/29/2012, 5:50 PM   Time Spent on Discharge: >30 minutes  Services  Ordered on Discharge: PT, RN aid  Equipment Ordered on Discharge: rolling walker

## 2012-12-28 NOTE — Progress Notes (Signed)
Went over all d/c info with pt.  

## 2012-12-28 NOTE — Progress Notes (Signed)
Pt discharged per w/c with all belongings and own 02 tank. Accompanied by daughter and volunteer. Pt has d/c info packet and HF packet with exit care notes.

## 2012-12-28 NOTE — Evaluation (Signed)
Occupational Therapy Evaluation and Discharge Patient Details Name: [name] MRN: [id number] DOB: [birth year] Today's Date: 12/28/2012 Time: 4098-1191 OT Time Calculation (min): 18 min  OT Assessment / Plan / Recommendation Clinical Impression  This 78 yo female admitted with inablity to urinate. Pt with hx of HF, COPD and uses O2 at home on 3 liters. Pt presents to acute OT at her baseline. No OT needs identified, will sign off.    OT Assessment  Patient does not need any further OT services    Follow Up Recommendations  No OT follow up       Equipment Recommendations  None recommended by OT          Precautions / Restrictions Precautions Precautions: None Restrictions Weight Bearing Restrictions: No       ADL  Equipment Used: Rolling walker (O2 at 3 liters) Transfers/Ambulation Related to ADLs: S for all in her room with RW use and O2 at 3 liters ADL Comments: Pt at an I/Mod I level for all BADLs. I went over energy conservation handout with her.        Visit Information  Last OT Received On: 12/28/12 Assistance Needed: +1    Subjective Data  Subjective: I am ready to go home today   Prior Functioning     Home Living Lives With:  (brother) Available Help at Discharge: Family;Available PRN/intermittently (son lives nearby, daughter takes her to grocery store) Type of Home: Mobile home Home Access: Ramped entrance Home Layout: One level Bathroom Shower/Tub: Tub/shower unit (pt sits on side of tub to bath) Bathroom Toilet: Standard Home Adaptive Equipment: Bedside commode/3-in-1;Hand-held shower hose;Walker - rolling;Walker - standard (O2 at 3 liters) Prior Function Level of Independence: Independent with assistive device(s) Able to Take Stairs?: No Driving: No Vocation: Retired Musician: No difficulties Dominant Hand: Right         Vision/Perception Vision - History Baseline Vision: Wears glasses all the time Patient  Visual Report: No change from baseline   Cognition  Cognition Arousal/Alertness: Awake/alert Behavior During Therapy: WFL for tasks assessed/performed Overall Cognitive Status: Within Functional Limits for tasks assessed    Extremity/Trunk Assessment Right Upper Extremity Assessment RUE ROM/Strength/Tone: Within functional levels Left Upper Extremity Assessment LUE ROM/Strength/Tone: Within functional levels     Mobility Bed Mobility Bed Mobility: Not assessed Details for Bed Mobility Assistance: Pt up in recliner upon arrival Transfers Transfers: Sit to Stand;Stand to Sit Sit to Stand: 5: Supervision;With upper extremity assist;With armrests;From chair/3-in-1 Stand to Sit: 5: Supervision;With upper extremity assist;With armrests;To chair/3-in-1           End of Session OT - End of Session Equipment Utilized During Treatment: Gait belt Activity Tolerance: Patient tolerated treatment well Patient left: in chair;with nursing in room Nurse Communication:  (Pt is ready to go from OT standpoint without follow up)       Evette Georges [phone] 12/28/2012, 10:34 AM

## 2013-01-08 ENCOUNTER — Emergency Department (HOSPITAL_COMMUNITY)
Admission: EM | Admit: 2013-01-08 | Discharge: 2013-01-08 | Disposition: A | Discharge status: Home / Self Care | Payer: Medicare Other | Source: Home / Self Care | Attending: Emergency Medicine | Admitting: Emergency Medicine

## 2013-01-08 ENCOUNTER — Emergency Department (HOSPITAL_COMMUNITY): Payer: Medicare Other

## 2013-01-08 ENCOUNTER — Encounter (HOSPITAL_COMMUNITY): Payer: Self-pay | Admitting: Cardiology

## 2013-01-08 DIAGNOSIS — F411 Generalized anxiety disorder: Secondary | ICD-10-CM | POA: Diagnosis present

## 2013-01-08 DIAGNOSIS — Z7982 Long term (current) use of aspirin: Secondary | ICD-10-CM | POA: Insufficient documentation

## 2013-01-08 DIAGNOSIS — J4489 Other specified chronic obstructive pulmonary disease: Secondary | ICD-10-CM | POA: Diagnosis present

## 2013-01-08 DIAGNOSIS — E079 Disorder of thyroid, unspecified: Secondary | ICD-10-CM | POA: Insufficient documentation

## 2013-01-08 DIAGNOSIS — N189 Chronic kidney disease, unspecified: Secondary | ICD-10-CM | POA: Diagnosis present

## 2013-01-08 DIAGNOSIS — E669 Obesity, unspecified: Secondary | ICD-10-CM | POA: Insufficient documentation

## 2013-01-08 DIAGNOSIS — J9610 Chronic respiratory failure, unspecified whether with hypoxia or hypercapnia: Secondary | ICD-10-CM | POA: Insufficient documentation

## 2013-01-08 DIAGNOSIS — Z8679 Personal history of other diseases of the circulatory system: Secondary | ICD-10-CM | POA: Insufficient documentation

## 2013-01-08 DIAGNOSIS — J45901 Unspecified asthma with (acute) exacerbation: Secondary | ICD-10-CM | POA: Insufficient documentation

## 2013-01-08 DIAGNOSIS — G4733 Obstructive sleep apnea (adult) (pediatric): Secondary | ICD-10-CM | POA: Insufficient documentation

## 2013-01-08 DIAGNOSIS — I10 Essential (primary) hypertension: Secondary | ICD-10-CM | POA: Insufficient documentation

## 2013-01-08 DIAGNOSIS — R0602 Shortness of breath: Secondary | ICD-10-CM | POA: Insufficient documentation

## 2013-01-08 DIAGNOSIS — I509 Heart failure, unspecified: Secondary | ICD-10-CM | POA: Diagnosis present

## 2013-01-08 DIAGNOSIS — E875 Hyperkalemia: Secondary | ICD-10-CM | POA: Diagnosis present

## 2013-01-08 DIAGNOSIS — I5032 Chronic diastolic (congestive) heart failure: Secondary | ICD-10-CM | POA: Diagnosis present

## 2013-01-08 DIAGNOSIS — E119 Type 2 diabetes mellitus without complications: Secondary | ICD-10-CM | POA: Insufficient documentation

## 2013-01-08 DIAGNOSIS — J449 Chronic obstructive pulmonary disease, unspecified: Secondary | ICD-10-CM | POA: Diagnosis present

## 2013-01-08 DIAGNOSIS — Z79899 Other long term (current) drug therapy: Secondary | ICD-10-CM | POA: Insufficient documentation

## 2013-01-08 DIAGNOSIS — I129 Hypertensive chronic kidney disease with stage 1 through stage 4 chronic kidney disease, or unspecified chronic kidney disease: Secondary | ICD-10-CM | POA: Diagnosis present

## 2013-01-08 DIAGNOSIS — Z6841 Body Mass Index (BMI) 40.0 and over, adult: Secondary | ICD-10-CM

## 2013-01-08 DIAGNOSIS — E662 Morbid (severe) obesity with alveolar hypoventilation: Secondary | ICD-10-CM | POA: Diagnosis present

## 2013-01-08 DIAGNOSIS — N179 Acute kidney failure, unspecified: Secondary | ICD-10-CM | POA: Diagnosis present

## 2013-01-08 DIAGNOSIS — I2789 Other specified pulmonary heart diseases: Secondary | ICD-10-CM | POA: Diagnosis present

## 2013-01-08 DIAGNOSIS — H409 Unspecified glaucoma: Secondary | ICD-10-CM | POA: Insufficient documentation

## 2013-01-08 DIAGNOSIS — R609 Edema, unspecified: Secondary | ICD-10-CM

## 2013-01-08 DIAGNOSIS — Z8719 Personal history of other diseases of the digestive system: Secondary | ICD-10-CM | POA: Insufficient documentation

## 2013-01-08 DIAGNOSIS — J9600 Acute respiratory failure, unspecified whether with hypoxia or hypercapnia: Principal | ICD-10-CM | POA: Diagnosis present

## 2013-01-08 DIAGNOSIS — Z8619 Personal history of other infectious and parasitic diseases: Secondary | ICD-10-CM | POA: Insufficient documentation

## 2013-01-08 DIAGNOSIS — Z880 Allergy status to penicillin: Secondary | ICD-10-CM | POA: Insufficient documentation

## 2013-01-08 LAB — CBC
HCT: 45.1 % (ref 36.0–46.0)
Hemoglobin: 14.8 g/dL (ref 12.0–15.0)
MCHC: 32.8 g/dL (ref 30.0–36.0)

## 2013-01-08 LAB — BASIC METABOLIC PANEL
BUN: 20 mg/dL (ref 6–23)
GFR calc non Af Amer: 54 mL/min — ABNORMAL LOW (ref >90)
Glucose, Bld: 237 mg/dL — ABNORMAL HIGH (ref 70–99)
Potassium: 4.6 mEq/L (ref 3.5–5.1)

## 2013-01-08 NOTE — ED Notes (Signed)
Pt returned from radiology.

## 2013-01-08 NOTE — ED Notes (Signed)
Pt went to PCP and told her to come to ED to get admitted for fluid overload. Pt wears 3 liters of oxygen at home. Pt reports she is more swollen and feels more sob than normal. Pt has 3+ pitting edema in bilateral lower extremities. Pt in nad, skin warm and dry, resp e/u.

## 2013-01-08 NOTE — ED Notes (Signed)
Pt reports she was sent here by PCP office because swelling noted to bilateral legs. States she normally wears 3l [place] at home and is currently using same amount. Denies any pain at this time. Pt reports that they recently added a new diabetic medication and she has not been feeling well since.

## 2013-01-08 NOTE — ED Notes (Signed)
Pt reports she was recently admitted for the same thing along with a UTI and was released about 2 weeks ago .

## 2013-01-08 NOTE — ED Notes (Signed)
O2 sats remaining in high 80s, Placed pt on 4Liters/min, pt denies feeling more sob than when she came in.

## 2013-01-09 NOTE — ED Provider Notes (Signed)
History     CSN: [id number]  Arrival date & time 01/08/13  1433   First MD Initiated Contact with Patient 01/08/13 [phone]      Chief Complaint  Patient presents with  . Leg Swelling    (Consider location/radiation/quality/duration/timing/severity/associated sxs/prior treatment) HPI... peripheral edema for several years, worse the past few days. This is not a new problem.  No chest pain or dyspnea.  Patient has multiple comorbidities well-documented in past medical history.   Nothing makes symptoms better or worse. Severity is moderate.  Past Medical History  Diagnosis Date  . Diabetes mellitus   . Hypertension   . Shingles   . GERD (gastroesophageal reflux disease)   . Abnormal heart rhythms   . COPD (chronic obstructive pulmonary disease)     on 3 L continuous   . Thyroid disease     hypthyroidism  . Pulmonary hypertension, moderate to severe     No evidence of CAD on Cath 2011  . OSA on CPAP     not compliant with cpap  . Obesity, morbid (more than 100 lbs over ideal weight or BMI > 40)   . Chronic respiratory failure with hypoxia 01/23/2012  . Glaucoma   . CHF (congestive heart failure)     cardiologist Dr. Rennis Golden    Past Surgical History  Procedure Laterality Date  . Tubal ligation    . Ovary removed    . Knee arthroscopy      left    Family History  Problem Relation Age of Onset  . Heart attack Father 28  . Heart disease Mother 18  . Lymphoma Daughter   . Diabetes Mother   . Diabetes      siblings, son  . Coronary artery disease Brother   . Stroke Brother     History  Substance Use Topics  . Smoking status: Never Smoker   . Smokeless tobacco: Current User    Types: Snuff  . Alcohol Use: No    OB History   Grav Para Term Preterm Abortions TAB SAB Ect Mult Living                  Review of Systems  All other systems reviewed and are negative.    Allergies  Diphenhydramine hcl; Aloe vera; and Penicillins  Home Medications   Current  Outpatient Rx  Name  Route  Sig  Dispense  Refill  . ALPRAZolam (XANAX) 0.5 MG tablet   Oral   Take 0.5 mg by mouth 4 (four) times daily as needed for anxiety.          Marland Kitchen aspirin 81 MG EC tablet   Oral   Take 81 mg by mouth daily.         . Brimonidine Tartrate (ALPHAGAN P OP)   Both Eyes   Place 1 drop into both eyes 2 (two) times daily.          . bumetanide (BUMEX) 2 MG tablet   Oral   Take 0.5 tablets (1 mg total) by mouth 2 (two) times daily.   60 tablet   1   . carvedilol (COREG) 3.125 MG tablet   Oral   Take 2 tablets (6.25 mg total) by mouth 2 (two) times daily with a meal.   60 tablet   1   . diltiazem (CARDIZEM CD) 180 MG 24 hr capsule   Oral   Take 180 mg by mouth daily.         Marland Kitchen  docusate sodium (COLACE) 50 MG capsule   Oral   Take 50 mg by mouth 2 (two) times daily.         Marland Kitchen ezetimibe (ZETIA) 10 MG tablet   Oral   Take 10 mg by mouth at bedtime.           . fexofenadine (ALLERGY RELIEF) 180 MG tablet   Oral   Take 180 mg by mouth daily.         . fish oil-omega-3 fatty acids 1000 MG capsule   Oral   Take 1 g by mouth 2 (two) times daily.           Marland Kitchen glipiZIDE (GLUCOTROL) 10 MG tablet   Oral   Take 10 mg by mouth 2 (two) times daily before a meal.           . lansoprazole (PREVACID) 30 MG capsule   Oral   Take 30 mg by mouth daily.           Marland Kitchen levothyroxine (SYNTHROID, LEVOTHROID) 150 MCG tablet   Oral   Take 150 mcg by mouth daily.         Marland Kitchen linagliptin (TRADJENTA) 5 MG TABS tablet   Oral   Take 5 mg by mouth daily.         Marland Kitchen losartan (COZAAR) 25 MG tablet   Oral   Take 1 tablet (25 mg total) by mouth 2 (two) times daily.   60 tablet   1   . oxyCODONE-acetaminophen (PERCOCET/ROXICET) 5-325 MG per tablet   Oral   Take 1 tablet by mouth every 4 (four) hours as needed for pain.         . potassium chloride SA (K-DUR,KLOR-CON) 20 MEQ tablet   Oral   Take 20 mEq by mouth 2 (two) times daily.         .  pravastatin (PRAVACHOL) 20 MG tablet   Oral   Take 20 mg by mouth at bedtime.          . Travoprost, BAK Free, (TRAVATAMN) 0.004 % SOLN ophthalmic solution   Both Eyes   Place 1 drop into both eyes 2 (two) times daily.             BP 108/65  Pulse 87  Temp(Src) 97.7 F (36.5 C) (Oral)  Resp 16  SpO2 90%  Physical Exam  Nursing note and vitals reviewed. Constitutional: She is oriented to person, place, and time.  Obese, no respiratory distress.  HENT:  Head: Normocephalic and atraumatic.  Eyes: Conjunctivae and EOM are normal. Pupils are equal, round, and reactive to light.  Neck: Normal range of motion. Neck supple.  Cardiovascular: Normal rate, regular rhythm and normal heart sounds.   Pulmonary/Chest: Effort normal and breath sounds normal.  No rales  Abdominal: Soft. Bowel sounds are normal.  Musculoskeletal: Normal range of motion.  Neurological: She is alert and oriented to person, place, and time.  Skin:  3+ peripheral edema  Psychiatric: She has a normal mood and affect.    ED Course  Procedures (including critical care time)  Labs Reviewed  BASIC METABOLIC PANEL - Abnormal; Notable for the following:    Glucose, Bld 237 (*)    GFR calc non Af Amer 54 (*)    GFR calc Af Amer 63 (*)    All other components within normal limits  PRO B NATRIURETIC PEPTIDE - Abnormal; Notable for the following:    Pro B Natriuretic peptide (BNP) 2632.0 (*)  All other components within normal limits  CBC   Dg Chest 2 View  01/08/2013   *RADIOLOGY REPORT*  Clinical Data: Shortness of breath.  COPD.  CHF.  Hypertension.  CHEST - 2 VIEW  Comparison: 12/25/2012  Findings: Hyperinflation/COPD. Lateral view degraded by patient arm position.  Mildly degraded exam due to AP portable technique and patient body habitus.  Cardiomegaly accentuated by AP portable technique.  No definite pleural fluid. No pneumothorax.  No congestive failure.  Low lung volumes with resultant pulmonary  interstitial prominence. Mild linear scarring in the left midlung.  Volume loss at right lung base.  IMPRESSION: Cardiomegaly and low lung volumes with patchy bilateral scarring. No definite acute findings.  Decreased sensitivity and specificity exam due to technique related factors, as described above.   Original Report Authenticated By: Jeronimo Greaves, M.D.     1. Peripheral edema       MDM   Chest x-ray shows no evidence of pulmonary edema. Patient is hemodynamically stable.  Can discharge with primary care followup.  Pulse ox noted to be low, but patient is not dyspneic      noted to be low,   Donnetta Hutching, MD 01/09/13 1217

## 2013-01-11 ENCOUNTER — Inpatient Hospital Stay (HOSPITAL_COMMUNITY)
Admission: EM | Admit: 2013-01-11 | Discharge: 2013-01-16 | DRG: 189 | Disposition: A | Discharge status: Home / Self Care | Payer: Medicare Other | Attending: Pulmonary Disease | Admitting: Pulmonary Disease

## 2013-01-11 ENCOUNTER — Emergency Department (HOSPITAL_COMMUNITY): Payer: Medicare Other

## 2013-01-11 ENCOUNTER — Encounter (HOSPITAL_COMMUNITY): Payer: Self-pay | Admitting: Emergency Medicine

## 2013-01-11 DIAGNOSIS — R300 Dysuria: Secondary | ICD-10-CM

## 2013-01-11 DIAGNOSIS — J9611 Chronic respiratory failure with hypoxia: Secondary | ICD-10-CM

## 2013-01-11 DIAGNOSIS — I2789 Other specified pulmonary heart diseases: Secondary | ICD-10-CM

## 2013-01-11 DIAGNOSIS — E039 Hypothyroidism, unspecified: Secondary | ICD-10-CM

## 2013-01-11 DIAGNOSIS — N390 Urinary tract infection, site not specified: Secondary | ICD-10-CM

## 2013-01-11 DIAGNOSIS — I959 Hypotension, unspecified: Secondary | ICD-10-CM

## 2013-01-11 DIAGNOSIS — E119 Type 2 diabetes mellitus without complications: Secondary | ICD-10-CM

## 2013-01-11 DIAGNOSIS — I5032 Chronic diastolic (congestive) heart failure: Secondary | ICD-10-CM

## 2013-01-11 DIAGNOSIS — I5033 Acute on chronic diastolic (congestive) heart failure: Secondary | ICD-10-CM

## 2013-01-11 DIAGNOSIS — I272 Pulmonary hypertension, unspecified: Secondary | ICD-10-CM

## 2013-01-11 DIAGNOSIS — J9600 Acute respiratory failure, unspecified whether with hypoxia or hypercapnia: Principal | ICD-10-CM

## 2013-01-11 DIAGNOSIS — I2729 Other secondary pulmonary hypertension: Secondary | ICD-10-CM

## 2013-01-11 DIAGNOSIS — E079 Disorder of thyroid, unspecified: Secondary | ICD-10-CM

## 2013-01-11 DIAGNOSIS — N179 Acute kidney failure, unspecified: Secondary | ICD-10-CM

## 2013-01-11 DIAGNOSIS — R339 Retention of urine, unspecified: Secondary | ICD-10-CM

## 2013-01-11 DIAGNOSIS — R0600 Dyspnea, unspecified: Secondary | ICD-10-CM

## 2013-01-11 DIAGNOSIS — D72829 Elevated white blood cell count, unspecified: Secondary | ICD-10-CM

## 2013-01-11 DIAGNOSIS — R0902 Hypoxemia: Secondary | ICD-10-CM

## 2013-01-11 DIAGNOSIS — J9610 Chronic respiratory failure, unspecified whether with hypoxia or hypercapnia: Secondary | ICD-10-CM

## 2013-01-11 LAB — COMPREHENSIVE METABOLIC PANEL
ALT: 15 U/L (ref 0–35)
AST: 26 U/L (ref 0–37)
Albumin: 3.8 g/dL (ref 3.5–5.2)
Alkaline Phosphatase: 138 U/L — ABNORMAL HIGH (ref 39–117)
Calcium: 9.8 mg/dL (ref 8.4–10.5)
GFR calc Af Amer: 29 mL/min — ABNORMAL LOW (ref >90)
Potassium: 5.5 mEq/L — ABNORMAL HIGH (ref 3.5–5.1)
Sodium: 135 mEq/L (ref 135–145)
Total Protein: 6.5 g/dL (ref 6.0–8.3)

## 2013-01-11 LAB — CBC WITH DIFFERENTIAL/PLATELET
Basophils Absolute: 0.1 10*3/uL (ref 0.0–0.1)
Basophils Relative: 1 % (ref 0–1)
Eosinophils Absolute: 0.1 10*3/uL (ref 0.0–0.7)
Eosinophils Relative: 1 % (ref 0–5)
MCH: 30 pg (ref 26.0–34.0)
MCV: 90.3 fL (ref 78.0–100.0)
Neutrophils Relative %: 70 % (ref 43–77)
Platelets: 196 10*3/uL (ref 150–400)
RBC: 5.24 MIL/uL — ABNORMAL HIGH (ref 3.87–5.11)
RDW: 14.5 % (ref 11.5–15.5)

## 2013-01-11 LAB — GRAM STAIN

## 2013-01-11 LAB — URINALYSIS, ROUTINE W REFLEX MICROSCOPIC
Hgb urine dipstick: NEGATIVE
Specific Gravity, Urine: 1.03 (ref 1.005–1.030)
pH: 5 (ref 5.0–8.0)

## 2013-01-11 LAB — POCT I-STAT 3, ART BLOOD GAS (G3+)
Acid-base deficit: 5 mmol/L — ABNORMAL HIGH (ref 0.0–2.0)
Bicarbonate: 20.6 mEq/L (ref 20.0–24.0)
O2 Saturation: 94 %
Patient temperature: 98.6
TCO2: 22 mmol/L (ref 0–100)

## 2013-01-11 LAB — URINE MICROSCOPIC-ADD ON

## 2013-01-11 LAB — CG4 I-STAT (LACTIC ACID): Lactic Acid, Venous: 2.96 mmol/L — ABNORMAL HIGH (ref 0.5–2.2)

## 2013-01-11 MED ORDER — TRAVOPROST (BAK FREE) 0.004 % OP SOLN
1.0000 [drp] | Freq: Two times a day (BID) | OPHTHALMIC | Status: DC
  Administered 2013-01-12 – 2013-01-13 (×4): 1 [drp] via OPHTHALMIC
  Filled 2013-01-11: qty 2.5

## 2013-01-11 MED ORDER — SIMVASTATIN 10 MG PO TABS
10.0000 mg | ORAL_TABLET | Freq: Every day | ORAL | Status: DC
  Administered 2013-01-12 – 2013-01-15 (×4): 10 mg via ORAL
  Filled 2013-01-11 (×5): qty 1

## 2013-01-11 MED ORDER — HEPARIN (PORCINE) IN NACL 100-0.45 UNIT/ML-% IJ SOLN
1200.0000 [IU]/h | INTRAMUSCULAR | Status: DC
  Administered 2013-01-12: 1200 [IU]/h via INTRAVENOUS
  Filled 2013-01-11 (×2): qty 250

## 2013-01-11 MED ORDER — OMEGA-3 FATTY ACIDS 1000 MG PO CAPS: 1.0000 g | ORAL_CAPSULE | Freq: Two times a day (BID) | ORAL | Status: DC

## 2013-01-11 MED ORDER — FUROSEMIDE 10 MG/ML IJ SOLN: 40.0000 mg | Freq: Two times a day (BID) | INTRAMUSCULAR | Status: DC

## 2013-01-11 MED ORDER — ALBUTEROL SULFATE (5 MG/ML) 0.5% IN NEBU: 2.5000 mg | INHALATION_SOLUTION | RESPIRATORY_TRACT | Status: DC | PRN

## 2013-01-11 MED ORDER — IPRATROPIUM BROMIDE 0.02 % IN SOLN: 0.5000 mg | RESPIRATORY_TRACT | Status: DC | PRN

## 2013-01-11 MED ORDER — IPRATROPIUM BROMIDE 0.02 % IN SOLN
1.5000 mg | Freq: Once | RESPIRATORY_TRACT | Status: AC
  Administered 2013-01-11: 1.5 mg via RESPIRATORY_TRACT
  Filled 2013-01-11: qty 7.5

## 2013-01-11 MED ORDER — LEVOTHYROXINE SODIUM 150 MCG PO TABS
150.0000 ug | ORAL_TABLET | Freq: Every day | ORAL | Status: DC
  Administered 2013-01-12 – 2013-01-16 (×5): 150 ug via ORAL
  Filled 2013-01-11 (×6): qty 1

## 2013-01-11 MED ORDER — ALBUTEROL SULFATE (5 MG/ML) 0.5% IN NEBU: 2.5000 mg | INHALATION_SOLUTION | RESPIRATORY_TRACT | Status: DC

## 2013-01-11 MED ORDER — BRIMONIDINE TARTRATE 0.2 % OP SOLN
1.0000 [drp] | Freq: Two times a day (BID) | OPHTHALMIC | Status: DC
  Administered 2013-01-12 – 2013-01-15 (×8): 1 [drp] via OPHTHALMIC
  Filled 2013-01-11: qty 5

## 2013-01-11 MED ORDER — SODIUM CHLORIDE 0.9 % IV BOLUS (SEPSIS)
250.0000 mL | Freq: Once | INTRAVENOUS | Status: AC
  Administered 2013-01-11: 250 mL via INTRAVENOUS

## 2013-01-11 MED ORDER — EZETIMIBE 10 MG PO TABS
10.0000 mg | ORAL_TABLET | Freq: Every day | ORAL | Status: DC
  Administered 2013-01-12 – 2013-01-15 (×5): 10 mg via ORAL
  Filled 2013-01-11 (×6): qty 1

## 2013-01-11 MED ORDER — FUROSEMIDE 10 MG/ML IJ SOLN
40.0000 mg | Freq: Once | INTRAMUSCULAR | Status: AC
  Administered 2013-01-11: 40 mg via INTRAVENOUS
  Filled 2013-01-11: qty 4

## 2013-01-11 MED ORDER — ASPIRIN EC 81 MG PO TBEC
81.0000 mg | DELAYED_RELEASE_TABLET | Freq: Every day | ORAL | Status: DC
Start: 2013-01-12 — End: 2013-01-16
  Administered 2013-01-12 – 2013-01-16 (×5): 81 mg via ORAL
  Filled 2013-01-11 (×5): qty 1

## 2013-01-11 MED ORDER — HEPARIN SODIUM (PORCINE) 5000 UNIT/ML IJ SOLN: 5000.0000 [IU] | Freq: Three times a day (TID) | INTRAMUSCULAR | Status: DC

## 2013-01-11 MED ORDER — IPRATROPIUM BROMIDE 0.02 % IN SOLN: 0.5000 mg | RESPIRATORY_TRACT | Status: DC

## 2013-01-11 MED ORDER — HEPARIN BOLUS VIA INFUSION
4000.0000 [IU] | Freq: Once | INTRAVENOUS | Status: AC
  Administered 2013-01-12: 4000 [IU] via INTRAVENOUS

## 2013-01-11 MED ORDER — PANTOPRAZOLE SODIUM 40 MG PO TBEC
40.0000 mg | DELAYED_RELEASE_TABLET | Freq: Every day | ORAL | Status: DC
  Administered 2013-01-12 – 2013-01-16 (×5): 40 mg via ORAL
  Filled 2013-01-11 (×5): qty 1

## 2013-01-11 MED ORDER — ALBUTEROL SULFATE (5 MG/ML) 0.5% IN NEBU
10.0000 mg | INHALATION_SOLUTION | Freq: Once | RESPIRATORY_TRACT | Status: AC
  Administered 2013-01-11: 10 mg via RESPIRATORY_TRACT
  Filled 2013-01-11: qty 2

## 2013-01-11 MED ORDER — INSULIN ASPART 100 UNIT/ML ~~LOC~~ SOLN
0.0000 [IU] | SUBCUTANEOUS | Status: DC
  Administered 2013-01-12: 8 [IU] via SUBCUTANEOUS
  Administered 2013-01-12: 3 [IU] via SUBCUTANEOUS
  Administered 2013-01-12: 2 [IU] via SUBCUTANEOUS
  Administered 2013-01-12: 11 [IU] via SUBCUTANEOUS
  Administered 2013-01-12: 2 [IU] via SUBCUTANEOUS
  Administered 2013-01-13 (×2): 3 [IU] via SUBCUTANEOUS
  Administered 2013-01-13: 2 [IU] via SUBCUTANEOUS
  Administered 2013-01-13: 3 [IU] via SUBCUTANEOUS
  Administered 2013-01-14 (×3): 2 [IU] via SUBCUTANEOUS
  Administered 2013-01-14: 1 [IU] via SUBCUTANEOUS
  Administered 2013-01-14 – 2013-01-15 (×3): 2 [IU] via SUBCUTANEOUS

## 2013-01-11 NOTE — ED Notes (Signed)
Pt alert and interactive on arrival to room, DOE and at rest. Pt able to ambulate from wheelchair to bed.

## 2013-01-11 NOTE — ED Notes (Signed)
Pt tolerating 4L [place] at SpO2 89%

## 2013-01-11 NOTE — ED Notes (Signed)
Dr. Herma Carson at bedside, requesting remove Bipap and monitor patient

## 2013-01-11 NOTE — ED Notes (Signed)
Bipap removed, pt tolerating non-rebreather at 10L without difficulty, SpO2 89%.. Dr. Herma Carson at bedside

## 2013-01-11 NOTE — ED Notes (Signed)
X-ray at bedside

## 2013-01-11 NOTE — H&P (Addendum)
PULMONARY  / CRITICAL CARE MEDICINE  Name: [name] MRN: [id number] DOB: 02-14-1935    ADMISSION DATE:  01/11/2013 CONSULTATION DATE:  01/11/2013  REFERRING MD :  EDP PRIMARY SERVICE:  PCCM  CHIEF COMPLAINT:  Hypoxemic respiratory failure  BRIEF PATIENT DESCRIPTION: 78 yo with past medical history of OSA (non-compliant with CPAP), chronic hypoxemia (chronically 3L [place] with baseline saturation 88-90%), diastolic heart failure and pulmonary hypertension (PAP 78 torr by TTE) brought to [facility] ED with complaints of acute dyspnea..  In ED found to be hypoxic requiring NIMV/BiPAP.  PCCM was consulted.  SIGNIFICANT EVENTS / STUDIES:  03/10/2012  PFT >>> No obstructive / restrictive disease, moderately decreased DLCO but normal when corrected to lung volume 10/14/2012  TTE >>> Diastolic dysfunction, PAP 78 torr  LINES / TUBES:  CULTURES:  ANTIBIOTICS:  The patient is mechanically ventilated with BiPAP and unable to provide history, which was obtained for available medical records.  HISTORY OF PRESENT ILLNESS:  78 yo with past medical history of OSA (non-compliant with CPAP), chronic hypoxemia (chronically 3L [place] with baseline saturation 88-90%), diastolic heart failure and pulmonary hypertension (PAP 78 torr by TTE) brought to [facility] ED with complaints of acute dyspnea..  In ED found to be hypoxic requiring NIMV/BiPAP.  PCCM was consulted.  PAST MEDICAL HISTORY :  Past Medical History  Diagnosis Date  . Diabetes mellitus   . Hypertension   . Shingles   . GERD (gastroesophageal reflux disease)   . Abnormal heart rhythms   . COPD (chronic obstructive pulmonary disease)     on 3 L continuous   . Thyroid disease     hypthyroidism  . Pulmonary hypertension, moderate to severe     No evidence of CAD on Cath 2011  . OSA on CPAP     not compliant with cpap  . Obesity, morbid (more than 100 lbs over ideal weight or BMI > 40)   . Chronic respiratory failure with hypoxia 01/23/2012  . Glaucoma    . CHF (congestive heart failure)     cardiologist Dr. Rennis Golden   Past Surgical History  Procedure Laterality Date  . Tubal ligation    . Ovary removed    . Knee arthroscopy      left   Prior to Admission medications   Medication Sig Start Date End Date Taking? Authorizing Provider  ALPRAZolam Prudy Feeler) 0.5 MG tablet Take 0.5 mg by mouth 4 (four) times daily as needed for anxiety.     Historical Provider, MD  aspirin 81 MG EC tablet Take 81 mg by mouth daily. 10/20/12   Brittainy Simmons, PA-C  Brimonidine Tartrate (ALPHAGAN P OP) Place 1 drop into both eyes 2 (two) times daily.     Historical Provider, MD  bumetanide (BUMEX) 2 MG tablet Take 0.5 tablets (1 mg total) by mouth 2 (two) times daily. 12/28/12   Annett Gula, MD  carvedilol (COREG) 3.125 MG tablet Take 2 tablets (6.25 mg total) by mouth 2 (two) times daily with a meal. 12/28/12   Annett Gula, MD  diltiazem (CARDIZEM CD) 180 MG 24 hr capsule Take 180 mg by mouth daily. 10/20/12   Brittainy Simmons, PA-C  docusate sodium (COLACE) 50 MG capsule Take 50 mg by mouth 2 (two) times daily.    Historical Provider, MD  ezetimibe (ZETIA) 10 MG tablet Take 10 mg by mouth at bedtime.      Historical Provider, MD  fexofenadine (ALLERGY RELIEF) 180 MG tablet Take 180 mg by  mouth daily.    Historical Provider, MD  fish oil-omega-3 fatty acids 1000 MG capsule Take 1 g by mouth 2 (two) times daily.      Historical Provider, MD  glipiZIDE (GLUCOTROL) 10 MG tablet Take 10 mg by mouth 2 (two) times daily before a meal.      Historical Provider, MD  lansoprazole (PREVACID) 30 MG capsule Take 30 mg by mouth daily.      Historical Provider, MD  levothyroxine (SYNTHROID, LEVOTHROID) 150 MCG tablet Take 150 mcg by mouth daily.    Historical Provider, MD  linagliptin (TRADJENTA) 5 MG TABS tablet Take 5 mg by mouth daily.    Historical Provider, MD  losartan (COZAAR) 25 MG tablet Take 1 tablet (25 mg total) by mouth 2 (two) times daily. 12/28/12   Annett Gula,  MD  oxyCODONE-acetaminophen (PERCOCET/ROXICET) 5-325 MG per tablet Take 1 tablet by mouth every 4 (four) hours as needed for pain.    Historical Provider, MD  potassium chloride SA (K-DUR,KLOR-CON) 20 MEQ tablet Take 20 mEq by mouth 2 (two) times daily. 10/20/12   Brittainy Simmons, PA-C  pravastatin (PRAVACHOL) 20 MG tablet Take 20 mg by mouth at bedtime.     Historical Provider, MD  Travoprost, BAK Free, (TRAVATAMN) 0.004 % SOLN ophthalmic solution Place 1 drop into both eyes 2 (two) times daily.      Historical Provider, MD   Allergies  Allergen Reactions  . Diphenhydramine Hcl Other (See Comments)    Glaucoma prevents patient from taking this medication   . Aloe Vera Rash  . Penicillins Swelling and Rash   FAMILY HISTORY:  Family History  Problem Relation Age of Onset  . Heart attack Father 9  . Heart disease Mother 43  . Lymphoma Daughter   . Diabetes Mother   . Diabetes      siblings, son  . Coronary artery disease Brother   . Stroke Brother    SOCIAL HISTORY:  reports that she has never smoked. Her smokeless tobacco use includes Snuff. She reports that she does not drink alcohol or use illicit drugs.  REVIEW OF SYSTEMS:  Unable to provide.  INTERVAL HISTORY:  VITAL SIGNS: Temp:  [97.8 F (36.6 C)-99.3 F (37.4 C)] 99.3 F (37.4 C) (05/19 2204) Pulse Rate:  [75-85] 75 (05/19 2245) Resp:  [12-23] 17 (05/19 2245) BP: (80-115)/(40-67) 81/52 mmHg (05/19 2245) SpO2:  [67 %-97 %] 97 % (05/19 2245) FiO2 (%):  [70 %] 70 % (05/19 2219)  HEMODYNAMICS:   VENTILATOR SETTINGS: Vent Mode:  [-]  FiO2 (%):  [70 %] 70 %  INTAKE / OUTPUT: Intake/Output   None     PHYSICAL EXAMINATION: General:  Obese, increased work of breathing Neuro:  Awake, alert, nonfocal HEENT:  PERRL, BiPAP mask on Cardiovascular:  RRR, no m/r/g Lungs:  Bilateral diminished air entry, few rales, no w/r Abdomen:  Soft, nontender, bowel sounds diminished Musculoskeletal:  Moves all extremities,  bilateral pitting LE edema Skin:  Intact  LABS:  Recent Labs Lab 01/08/13 1510 01/11/13 2040 01/11/13 2115 01/11/13 2121  HGB 14.8 15.7*  --   --   WBC 6.9 10.6*  --   --   PLT 164 196  --   --   NA 139 135  --   --   K 4.6 5.5*  --   --   CL 104 97  --   --   CO2 25 21  --   --   GLUCOSE 237* 227*  --   --  BUN 20 27*  --   --   CREATININE 0.99 1.90*  --   --   CALCIUM 9.8 9.8  --   --   AST  --  26  --   --   ALT  --  15  --   --   ALKPHOS  --  138*  --   --   BILITOT  --  0.6  --   --   PROT  --  6.5  --   --   ALBUMIN  --  3.8  --   --   LATICACIDVEN  --   --   --  2.96*  PROBNP 2632.0* 5411.0*  --   --   PHART  --   --  7.335*  --   PCO2ART  --   --  38.7  --   PO2ART  --   --  73.0*  --    No results found for this basename: GLUCAP,  in the last 168 hours  CXR:  5/19 >>> No overt airspace disease, pulmonary vascular congestion  ASSESSMENT / PLAN:  PULMONARY A:  Severe pulmonary hypertension (PAP 78 torr by TTE, primarty?) with R sided congestive heart failure.  OSA, non-compliant with CPAP.  Chronic hypoxia (baseline SpO2 88-90 on 3L [place]).  Acute on chronic hypoxemic renal failure. Possible pulmonary embolism. P:   Gaol SpO2>88-90 Supplemental oxygen PRN BiPAP for now, will attempt to titrate to off if work of breathing allows No indication for bronchodilators / systemic steroids Renal function prohibitive of chest CT angio Empirical Heparin gtt TTE Venous Doppler BL LE Would discuss with cardiology in AM re repeat R heart cath if renal function improves  CARDIOVASCULAR A: Diastolic heart failure, chronic.  Hypotension. P:  Goal MAP>55-60 Trend troponin / lactate Hold Coreg, Cardizem, Losartan Continue ASA, Zetia, Pravachol  RENAL A:  Acute on chronic renal failure.  Mild hyperkalemia. P:   Trend BMP Hold Preadmission Bumex Lasix 40 IV q12h Hold K supplements   GASTROINTESTINAL A:  No active issues. P:   NPO as om BiPAP Protonix as on  Prevacid preadmission  HEMATOLOGIC A:  Hemoconcentration.  Empirical anticoagulation for presumed pulmonary embolism. P:  Trend CBC Trend APTT per Pharmacy as on Heparin gtt  INFECTIOUS A:  No evidence of acute infection. P:   PCT  ENDOCRINE  A:  Morbid obesity.  DM.  Hyperglycemia.  Hypothyroidism.  P:   SSI Hold Linagliptin, Glipizide Synthroid  NEUROLOGIC A:  No active issues. P:   Hold Xanax for now  TODAY'S SUMMARY: 78 yo with past medical history of OSA (non-compliant with CPAP), chronic hypoxemia (chronically 3L [place] with baseline saturation 88-90%), diastolic heart failure and pulmonary hypertension (PAP 78 torr by TTE) brought to [facility] ED with complaints of acute dyspnea..  In ED found to be hypoxic requiring NIMV/BiPAP.  PCCM was consulted.  Tonight - attempt to titrate BiPAP to off.  Accept SpO2 88-90.  Empirical Heparin gtt for possible PE.  TTE/Venous Doppler in AM.  I have personally obtained a history, examined the patient, evaluated laboratory and imaging results, formulated the assessment and plan and placed orders.  CRITICAL CARE:  The patient is critically ill with multiple organ systems failure and requires high complexity decision making for assessment and support, frequent evaluation and titration of therapies, application of advanced monitoring technologies and extensive interpretation of multiple databases. Critical Care Time devoted to patient care services described in this note is 60 minutes.  Lonia Farber, MD Pulmonary and Critical Care Medicine [facility] Pager: [phone]  01/11/2013, 11:23 PM

## 2013-01-11 NOTE — Progress Notes (Signed)
ANTICOAGULATION CONSULT NOTE - Initial Consult  Pharmacy Consult for Heparin Indication: r/o PE  Allergies  Allergen Reactions  . Diphenhydramine Hcl Other (See Comments)    Glaucoma prevents patient from taking this medication   . Aloe Vera Rash  . Penicillins Swelling and Rash    Patient Measurements: Height: 5\' 1"  (154.9 cm) Weight: 226 lb (102.513 kg) IBW/kg (Calculated) : 47.8 Heparin Dosing Weight: 75 kg  Vital Signs: Temp: 99.3 F (37.4 C) (05/19 2204) Temp src: Oral (05/19 2029) BP: 85/56 mmHg (05/19 2315) Pulse Rate: 74 (05/19 2315)  Labs:  Recent Labs  01/11/13 2040  HGB 15.7*  HCT 47.3*  PLT 196  CREATININE 1.90*    Estimated Creatinine Clearance: 28.2 ml/min (by C-G formula based on Cr of 1.9).   Medical History: Past Medical History  Diagnosis Date  . Diabetes mellitus   . Hypertension   . Shingles   . GERD (gastroesophageal reflux disease)   . Abnormal heart rhythms   . COPD (chronic obstructive pulmonary disease)     on 3 L continuous   . Thyroid disease     hypthyroidism  . Pulmonary hypertension, moderate to severe     No evidence of CAD on Cath 2011  . OSA on CPAP     not compliant with cpap  . Obesity, morbid (more than 100 lbs over ideal weight or BMI > 40)   . Chronic respiratory failure with hypoxia 01/23/2012  . Glaucoma   . CHF (congestive heart failure)     cardiologist Dr. Rennis Golden    Medications:  Xanax  ASA  Bumex  Coreg  Cardizem  Colace  Zetia  Allegra  Lovaza  Glucotrol  Prevacid  Synthroid  Cozaar  Percocet  KCl  Pravachol  Travatan  Assessment: 78 yo female with SOB, possible PE, for heparin  Goal of Therapy:  Heparin level 0.3-0.7 units/ml Monitor platelets by anticoagulation protocol: Yes   Plan:  Heparin 4000 units IV bolus, then 1200 units/hr Check heparin level in 8 hours.  Eddie Candle 01/11/2013,11:38 PM

## 2013-01-11 NOTE — Progress Notes (Signed)
Pt removed from bipap by RN per Dr.Z.  Pt was placed on a NRB.  RT changed NRB to 6lpm [place].  Pt is in no distress at this time and sats are 90%.  RT will continue to monitor.

## 2013-01-11 NOTE — ED Notes (Signed)
PT. REPORTS EXERTIONAL DYSPNEA  WITH GENERALIZED WEAKNESS / LOW O2 SAT ( 76 % 3 LPM/[place] [facility] OXYGEN) TODAY ALSO REPORTS URINARY RETENTION , HISTORY OF COPD AND CHF.

## 2013-01-11 NOTE — ED Notes (Signed)
Stat gram stain results: WBC present, poly and mono; Gram Positive Cocci in pairs, and Gram Positive Rods and Chains; Squamous Epi cells present. Per Lab. Admitting MD aware

## 2013-01-11 NOTE — ED Notes (Signed)
EDP and respiratory at bedside 

## 2013-01-11 NOTE — ED Notes (Signed)
Pt tolerating bipap well, alert and interactive.

## 2013-01-11 NOTE — ED Notes (Signed)
Pt placed on Bipap

## 2013-01-11 NOTE — ED Notes (Signed)
Respiratory notified for breathing treatment.

## 2013-01-11 NOTE — ED Notes (Signed)
PT. PLACED ON 4 LPM /[place] AT TRIAGE , WILL MOVE TO  ER BED ASAP.

## 2013-01-12 ENCOUNTER — Inpatient Hospital Stay (HOSPITAL_COMMUNITY): Payer: Medicare Other

## 2013-01-12 DIAGNOSIS — R0602 Shortness of breath: Secondary | ICD-10-CM

## 2013-01-12 DIAGNOSIS — R609 Edema, unspecified: Secondary | ICD-10-CM

## 2013-01-12 DIAGNOSIS — I369 Nonrheumatic tricuspid valve disorder, unspecified: Secondary | ICD-10-CM

## 2013-01-12 DIAGNOSIS — I5033 Acute on chronic diastolic (congestive) heart failure: Secondary | ICD-10-CM

## 2013-01-12 DIAGNOSIS — I509 Heart failure, unspecified: Secondary | ICD-10-CM

## 2013-01-12 LAB — GLUCOSE, CAPILLARY
Glucose-Capillary: 139 mg/dL — ABNORMAL HIGH (ref 70–99)
Glucose-Capillary: 143 mg/dL — ABNORMAL HIGH (ref 70–99)
Glucose-Capillary: 155 mg/dL — ABNORMAL HIGH (ref 70–99)
Glucose-Capillary: 161 mg/dL — ABNORMAL HIGH (ref 70–99)

## 2013-01-12 LAB — BASIC METABOLIC PANEL
BUN: 28 mg/dL — ABNORMAL HIGH (ref 6–23)
CO2: 19 mEq/L (ref 19–32)
Calcium: 9.2 mg/dL (ref 8.4–10.5)
Chloride: 98 mEq/L (ref 96–112)
Creatinine, Ser: 1.76 mg/dL — ABNORMAL HIGH (ref 0.50–1.10)
GFR calc Af Amer: 31 mL/min — ABNORMAL LOW (ref >90)

## 2013-01-12 LAB — TROPONIN I: Troponin I: <0.3 ng/mL (ref <0.30)

## 2013-01-12 LAB — CBC WITH DIFFERENTIAL/PLATELET
Basophils Relative: 1 % (ref 0–1)
Eosinophils Relative: 1 % (ref 0–5)
HCT: 46.7 % — ABNORMAL HIGH (ref 36.0–46.0)
Hemoglobin: 15.1 g/dL — ABNORMAL HIGH (ref 12.0–15.0)
MCHC: 32.3 g/dL (ref 30.0–36.0)
MCV: 89.8 fL (ref 78.0–100.0)
Monocytes Absolute: 1 10*3/uL (ref 0.1–1.0)
Monocytes Relative: 11 % (ref 3–12)
Neutro Abs: 5.7 10*3/uL (ref 1.7–7.7)

## 2013-01-12 LAB — POCT I-STAT 3, ART BLOOD GAS (G3+)
pCO2 arterial: 43.1 mmHg (ref 35.0–45.0)
pO2, Arterial: 49 mmHg — ABNORMAL LOW (ref 80.0–100.0)

## 2013-01-12 LAB — HEPARIN LEVEL (UNFRACTIONATED)
Heparin Unfractionated: 0.35 IU/mL (ref 0.30–0.70)
Heparin Unfractionated: 0.52 IU/mL (ref 0.30–0.70)

## 2013-01-12 LAB — MRSA PCR SCREENING: MRSA by PCR: NEGATIVE

## 2013-01-12 LAB — PROCALCITONIN: Procalcitonin: <0.1 ng/mL

## 2013-01-12 LAB — LACTIC ACID, PLASMA: Lactic Acid, Venous: 1.8 mmol/L (ref 0.5–2.2)

## 2013-01-12 MED ORDER — SODIUM POLYSTYRENE SULFONATE 15 GM/60ML PO SUSP: 30.0000 g | Freq: Once | ORAL | Status: DC

## 2013-01-12 MED ORDER — FUROSEMIDE 10 MG/ML IJ SOLN
40.0000 mg | Freq: Two times a day (BID) | INTRAMUSCULAR | Status: DC
  Administered 2013-01-12 – 2013-01-15 (×6): 40 mg via INTRAVENOUS
  Filled 2013-01-12 (×8): qty 4

## 2013-01-12 MED ORDER — TECHNETIUM TC 99M DIETHYLENETRIAME-PENTAACETIC ACID: 40.0000 | Freq: Once | INTRAVENOUS | Status: AC | PRN

## 2013-01-12 MED ORDER — FUROSEMIDE 10 MG/ML IJ SOLN
40.0000 mg | Freq: Two times a day (BID) | INTRAMUSCULAR | Status: DC
  Administered 2013-01-12: 40 mg via INTRAVENOUS
  Filled 2013-01-12 (×2): qty 4

## 2013-01-12 MED ORDER — TECHNETIUM TO 99M ALBUMIN AGGREGATED
6.0000 | Freq: Once | INTRAVENOUS | Status: AC | PRN
  Administered 2013-01-12: 6 via INTRAVENOUS

## 2013-01-12 MED ORDER — OMEGA-3-ACID ETHYL ESTERS 1 G PO CAPS
1.0000 g | ORAL_CAPSULE | Freq: Two times a day (BID) | ORAL | Status: DC
  Administered 2013-01-12 – 2013-01-16 (×10): 1 g via ORAL
  Filled 2013-01-12 (×11): qty 1

## 2013-01-12 MED ORDER — HEPARIN (PORCINE) IN NACL 100-0.45 UNIT/ML-% IJ SOLN
1250.0000 [IU]/h | INTRAMUSCULAR | Status: DC
  Administered 2013-01-12: 1250 [IU]/h via INTRAVENOUS
  Filled 2013-01-12 (×3): qty 250

## 2013-01-12 MED ORDER — FUROSEMIDE 10 MG/ML IJ SOLN: 40.0000 mg | Freq: Two times a day (BID) | INTRAMUSCULAR | Status: DC

## 2013-01-12 MED ORDER — SODIUM CHLORIDE 0.9 % IV SOLN: INTRAVENOUS | Status: DC

## 2013-01-12 NOTE — ED Notes (Signed)
Attempted report 

## 2013-01-12 NOTE — Progress Notes (Signed)
ANTICOAGULATION CONSULT NOTE - Follow Up Consult  Pharmacy Consult for Heparin Indication: r/o PE  Allergies  Allergen Reactions  . Diphenhydramine Hcl Other (See Comments)    Glaucoma prevents patient from taking this medication   . Aloe Vera Rash  . Penicillins Swelling and Rash   Patient Measurements: Height: 5\' 1"  (154.9 cm) Weight: 236 lb 15.9 oz (107.5 kg) IBW/kg (Calculated) : 47.8 Heparin Dosing Weight: Kari kg  Vital Signs: Temp: 98.3 F (36.8 C) (05/20 1400) Temp src: Oral (05/20 1223) BP: 113/93 mmHg (05/20 2000) Pulse Rate: 82 (05/20 2000) Labs:  Recent Labs  01/11/13 2040 01/12/13 0025 01/12/13 0520 01/12/13 0859 01/12/13 1315 01/12/13 1842  HGB 15.7* 15.1*  --   --   --   --   HCT 47.3* 46.7*  --   --   --   --   PLT 196 144*  --   --   --   --   HEPARINUNFRC  --   --   --  0.35  --  0.52  CREATININE 1.90* 1.76*  --   --   --   --   TROPONINI  --  <0.30 <0.30  --  <0.30  --    Estimated Creatinine Clearance: 31.3 ml/min (by C-G formula based on Cr of 1.76).  Medications:  Infusions:  . sodium chloride 20 mL/hr at 01/12/13 1023  . heparin 1,250 Units/hr (01/12/13 1738)   Assessment: [name] admitted with acute SOB concerning for possible PE started on IV heparin. Heparin level is therapeutic at 0.52 on 1250 units/hr. H/H stable. Platelets 144 (decreased this AM)- will monitor closely. No bleeding noted per RN. Noted VQ scan negative for PE, dopplers neg for DVT.  Goal of Therapy:  Heparin level 0.3-0.7 units/ml Monitor platelets by anticoagulation protocol: Yes   Plan:  1. Continue heparin at 1250 units/hr.  2. CBC and heparin level daily.  3. Consider d/c heparin in a.m. as VQ scan and doppler negative for VTE.  Christoper Fabian, PharmD, BCPS Clinical pharmacist, pager [phone] 01/12/2013,8:52 PM

## 2013-01-12 NOTE — Progress Notes (Signed)
Doppler and vq negative for PE  Plan Dc heparin   Dr. Kalman Shan, M.D., [facility].C.P Pulmonary and Critical Care Medicine Staff Physician [facility] [place] [facility]monary and Critical [facility] Pager: [phone], If no answer or between  15:00h - 7:00h: call [phone]  01/12/2013 10:45 PM

## 2013-01-12 NOTE — Progress Notes (Signed)
Bilateral lower extremity venous duplex:  No evidence of DVT, superficial thrombosis, or Baker's Cyst.   

## 2013-01-12 NOTE — ED Provider Notes (Signed)
History     CSN: [id number]  Arrival date & time 01/11/13  2022   First MD Initiated Contact with Patient 01/11/13 2041      Chief Complaint  Patient presents with  . Shortness of Breath  . Urinary Retention    Patient is a 78 y.o. female presenting with shortness of breath.  Shortness of Breath Severity:  Severe Onset quality:  Sudden Duration:  8 hours Timing:  Constant Progression:  Worsening Chronicity:  New Context comment:  At rest; she began having difficulty breathing when she returned from a primary care physcian followup visit earlier today. Relieved by:  Nothing Worsened by:  Activity and exertion Ineffective treatments:  None tried Associated symptoms: no abdominal pain, no chest pain, no cough, no diaphoresis, no fever, no headaches, no hemoptysis, no neck pain, no PND, no rash, no sputum production, no syncope, no vomiting and no wheezing   Risk factors: no hx of PE/DVT   Risk factors comment:  History of CHF, COPD, DM 2, HTN, pulmonary HTN, OSA  She has had some significant lower extremity edema over the past few days.   Patient reports that she has been compliant with her Bumex and her CHF diet.    EMS reports that on their arrival, patient was in severe respiratory distress, hypoxic to 73% on her usual 3 L O2 by nasal canula.    Past Medical History  Diagnosis Date  . Diabetes mellitus   . Hypertension   . Shingles   . GERD (gastroesophageal reflux disease)   . Abnormal heart rhythms   . COPD (chronic obstructive pulmonary disease)     on 3 L continuous   . Thyroid disease     hypthyroidism  . Pulmonary hypertension, moderate to severe     No evidence of CAD on Cath 2011  . OSA on CPAP     not compliant with cpap  . Obesity, morbid (more than 100 lbs over ideal weight or BMI > 40)   . Chronic respiratory failure with hypoxia 01/23/2012  . Glaucoma   . CHF (congestive heart failure)     cardiologist Dr. Rennis Golden    Past Surgical History   Procedure Laterality Date  . Tubal ligation    . Ovary removed    . Knee arthroscopy      left    Family History  Problem Relation Age of Onset  . Heart attack Father 27  . Heart disease Mother 13  . Lymphoma Daughter   . Diabetes Mother   . Diabetes      siblings, son  . Coronary artery disease Brother   . Stroke Brother     History  Substance Use Topics  . Smoking status: Never Smoker   . Smokeless tobacco: Current User    Types: Snuff  . Alcohol Use: No    OB History   Grav Para Term Preterm Abortions TAB SAB Ect Mult Living                  Review of Systems  Constitutional: Negative for fever, chills and diaphoresis.  HENT: Negative for congestion, rhinorrhea, neck pain and neck stiffness.   Respiratory: Positive for shortness of breath. Negative for cough, hemoptysis, sputum production and wheezing.   Cardiovascular: Negative for chest pain, leg swelling, syncope and PND.  Gastrointestinal: Negative for nausea, vomiting, abdominal pain and diarrhea.  Genitourinary: Positive for difficulty urinating. Negative for dysuria, urgency, frequency, flank pain, vaginal bleeding and vaginal discharge.  Skin: Negative for rash.  Neurological: Negative for weakness, numbness and headaches.  All other systems reviewed and are negative.    Allergies  Diphenhydramine hcl; Aloe vera; and Penicillins  Home Medications   Current Outpatient Rx  Name  Route  Sig  Dispense  Refill  . ALPRAZolam (XANAX) 0.5 MG tablet   Oral   Take 0.5 mg by mouth 4 (four) times daily as needed for anxiety.          Marland Kitchen aspirin 81 MG EC tablet   Oral   Take 81 mg by mouth daily.         . brimonidine (ALPHAGAN P) 0.1 % SOLN   Both Eyes   Place 1 drop into both eyes daily.         . bumetanide (BUMEX) 2 MG tablet   Oral   Take 0.5 tablets (1 mg total) by mouth 2 (two) times daily.   60 tablet   1   . carvedilol (COREG) 3.125 MG tablet   Oral   Take 2 tablets (6.25 mg  total) by mouth 2 (two) times daily with a meal.   60 tablet   1   . diltiazem (CARDIZEM CD) 180 MG 24 hr capsule   Oral   Take 180 mg by mouth daily.         Marland Kitchen ezetimibe (ZETIA) 10 MG tablet   Oral   Take 10 mg by mouth at bedtime.           Marland Kitchen glipiZIDE (GLUCOTROL) 10 MG tablet   Oral   Take 10 mg by mouth 2 (two) times daily before a meal.           . lansoprazole (PREVACID) 30 MG capsule   Oral   Take 30 mg by mouth daily.           Marland Kitchen levothyroxine (SYNTHROID, LEVOTHROID) 150 MCG tablet   Oral   Take 150 mcg by mouth daily.         Marland Kitchen linagliptin (TRADJENTA) 5 MG TABS tablet   Oral   Take 5 mg by mouth daily.         Marland Kitchen losartan (COZAAR) 25 MG tablet   Oral   Take 1 tablet (25 mg total) by mouth 2 (two) times daily.   60 tablet   1   . nitrofurantoin, macrocrystal-monohydrate, (MACROBID) 100 MG capsule   Oral   Take 100 mg by mouth 2 (two) times daily. Started 01/08/13 for 7 day treatment         . omega-3 acid ethyl esters (LOVAZA) 1 G capsule   Oral   Take 1 g by mouth 2 (two) times daily.         Marland Kitchen oxyCODONE-acetaminophen (PERCOCET/ROXICET) 5-325 MG per tablet   Oral   Take 1 tablet by mouth every 4 (four) hours as needed for pain.         . potassium chloride SA (K-DUR,KLOR-CON) 20 MEQ tablet   Oral   Take 20 mEq by mouth 2 (two) times daily.         . pravastatin (PRAVACHOL) 20 MG tablet   Oral   Take 20 mg by mouth at bedtime.          . Travoprost, BAK Free, (TRAVATAN) 0.004 % SOLN ophthalmic solution   Both Eyes   Place 1 drop into both eyes at bedtime.           BP 81/50  Pulse 78  Temp(Src) 99.3 F (37.4 C) (Oral)  Resp 19  Ht 5\' 1"  (1.549 m)  Wt 226 lb (102.513 kg)  BMI 42.72 kg/m2  SpO2 84%  Physical Exam  Nursing note and vitals reviewed. Constitutional: She is oriented to person, place, and time. She appears well-developed and well-nourished. She appears distressed (respiratory distress).  Hypoxic to 76% on  3 L O2 by nasal canula  HENT:  Head: Normocephalic and atraumatic.  Mouth/Throat: Oropharynx is clear and moist.  Eyes: Conjunctivae and EOM are normal. Pupils are equal, round, and reactive to light. No scleral icterus.  Neck: Normal range of motion. Neck supple. No JVD present.  Cardiovascular: Normal rate, regular rhythm, normal heart sounds and intact distal pulses.  Exam reveals no gallop and no friction rub.   No murmur heard. Pulmonary/Chest: No accessory muscle usage. Tachypnea noted. She is in respiratory distress. She has decreased breath sounds (diffusely). She has no wheezes. She has no rhonchi. She has no rales.  Abdominal: Soft. Bowel sounds are normal. She exhibits no distension. There is no tenderness. There is no rebound and no guarding.  Musculoskeletal: She exhibits no edema.       Right lower leg: She exhibits edema (1+ pitting edema to mid calf).       Left lower leg: She exhibits edema (1+ pitting edema to mid calf).  Neurological: She is alert and oriented to person, place, and time. No cranial nerve deficit. She exhibits normal muscle tone. Coordination normal.  Skin: Skin is warm and dry. She is not diaphoretic.    ED Course  Procedures (including critical care time)  Labs Reviewed  PRO B NATRIURETIC PEPTIDE - Abnormal; Notable for the following:    Pro B Natriuretic peptide (BNP) 5411.0 (*)    All other components within normal limits  CBC WITH DIFFERENTIAL - Abnormal; Notable for the following:    WBC 10.6 (*)    RBC 5.24 (*)    Hemoglobin 15.7 (*)    HCT 47.3 (*)    Monocytes Absolute 1.2 (*)    All other components within normal limits  COMPREHENSIVE METABOLIC PANEL - Abnormal; Notable for the following:    Potassium 5.5 (*)    Glucose, Bld 227 (*)    BUN 27 (*)    Creatinine, Ser 1.90 (*)    Alkaline Phosphatase 138 (*)    GFR calc non Af Amer 25 (*)    GFR calc Af Amer 29 (*)    All other components within normal limits  URINALYSIS, ROUTINE W  REFLEX MICROSCOPIC - Abnormal; Notable for the following:    Color, Urine RED (*)    APPearance CLOUDY (*)    Glucose, UA 100 (*)    Bilirubin Urine MODERATE (*)    Ketones, ur 15 (*)    Leukocytes, UA MODERATE (*)    All other components within normal limits  URINE MICROSCOPIC-ADD ON - Abnormal; Notable for the following:    Squamous Epithelial / LPF FEW (*)    Bacteria, UA MANY (*)    Casts GRANULAR CAST (*)    All other components within normal limits  POCT I-STAT 3, BLOOD GAS (G3+) - Abnormal; Notable for the following:    pH, Arterial 7.335 (*)    pO2, Arterial 73.0 (*)    Acid-base deficit 5.0 (*)    All other components within normal limits  CG4 I-STAT (LACTIC ACID) - Abnormal; Notable for the following:    Lactic Acid, Venous 2.96 (*)  All other components within normal limits  GRAM STAIN  URINE CULTURE  CULTURE, BLOOD (ROUTINE X 2)  CULTURE, BLOOD (ROUTINE X 2)  CBC WITH DIFFERENTIAL  BASIC METABOLIC PANEL  PRO B NATRIURETIC PEPTIDE  TROPONIN I  LACTIC ACID, PLASMA  PROCALCITONIN  POCT I-STAT TROPONIN I   Dg Chest Portable 1 View  01/11/2013   *RADIOLOGY REPORT*  Clinical Data: Shortness of breath.  PORTABLE CHEST - 1 VIEW  Comparison: 01/08/2013.  Findings: The heart is enlarged but stable.  The mediastinal and hilar contours are prominent but unchanged.  Mild central vascular congestion without overt pulmonary edema.  Underlying chronic lung changes are noted.  No pleural effusion.  IMPRESSION:  1.  Cardiac enlargement and mild central vascular congestion without overt pulmonary edema. 2.  Underlying chronic lung changes.   Original Report Authenticated By: Rudie Meyer, M.D.    Date: 01/12/2013  Rate: 82  Rhythm: normal sinus rhythm  QRS Axis: right  Intervals: PR prolonged  ST/T Wave abnormalities: nonspecific T wave changes  Conduction Disutrbances:first-degree A-V block   Narrative Interpretation:   Old EKG Reviewed: unchanged    1. Acute respiratory  failure   2. Acute renal failure   3. Chronic respiratory failure with hypoxia   4. DM2 (diabetes mellitus, type 2)   5. Pulmonary hypertension, moderate to severe       MDM  78 year old female with history of congestive heart failure, COPD on 3 L of oxygen by nasal cannula at home, pulmonary hypertension and recent admission for CHF exacerbation presents complaining of respiratory distress. Onset approximately 8 hours prior to arrival. States that she was previously well and went to a primary care physician followup appointment today. At that time she was having no respiratory difficulty and was sent home. Shortly after she arrived at home, around noon, she started having difficulty breathing which has progressively worsened over the past several hours. EMS was called and on arrival she was in significant respiratory distress, hypoxic to 76% on her usual 3 L by nasal cannula, speaking in half sentences. She was placed her on a nonrebreather for transport. On arrival to the emergency department she continues to be hypoxic to the upper 60s and low 70s on nonrebreather. She has tachypnea, speaks in partial sentences, and though her initial blood pressure is normal, she shortly developed hypotension to the 80s over 50s.  She is placed on BiPAP and given gentle IV fluid rehydration with 250 cc boluses x2. Her blood pressure improved to the 90s over 50s systolic. Respiratory distress improved on BiPAP. She was given a continuous neb with Atrovent. Her chest x-ray demonstrates cardiomegaly but no overt pulmonary edema and no pneumonia. Core temperature was not elevated. Although this could be a CHF exacerbation, or COPD exacerbation, her exam is not definitive, she is not wheezing significantly, she appears somewhat volume overloaded with some pitting edema in her lower extremities but her chest is relatively clear.  I feel that a pulmonary embolus is definitely possible; she is not an ideal candidate to  receive a contrast load due to her acute kidney injury and she is not stable for the scanner this time. She will be heparinized. She is admitted to the critical care team for further management.        Toney Sang, MD 01/12/13 [phone]

## 2013-01-12 NOTE — H&P (Addendum)
PULMONARY  / CRITICAL CARE MEDICINE  Name: [name] MRN: [id number] DOB: December 15, 1934    ADMISSION DATE:  01/11/2013 CONSULTATION DATE:  01/11/2013  REFERRING MD :  EDP PRIMARY SERVICE:  PCCM  CHIEF COMPLAINT:  Hypoxemic respiratory failure  BRIEF PATIENT DESCRIPTION: 78 yo with past medical history of OSA (non-compliant with CPAP), chronic hypoxemia (chronically 3L [place] with baseline saturation 88-90%), diastolic heart failure and pulmonary hypertension (PAP 78 torr by TTE) brought to [facility] ED with complaints of acute dyspnea..  In ED found to be hypoxic requiring NIMV/BiPAP.  PCCM was consulted.  LINES / TUBES:  CULTURES: Results for orders placed during the hospital encounter of 01/11/13  GRAM STAIN     Status: None   Collection Time    01/11/13 10:21 PM      Result Value Range Status   Specimen Description URINE, RANDOM   Final   Special Requests NONE   Final   Gram Stain     Final   Value: WBC PRESENT,BOTH PMN AND MONONUCLEAR     GRAM POSITIVE COCCI IN PAIRS     GRAM POSITIVE RODS IN CHAINS     SQUAMOUS EPITHELIAL CELLS PRESENT     CYTOSPIN SLIDE   Report Status 01/11/2013 FINAL   Final  MRSA PCR SCREENING     Status: None   Collection Time    01/12/13 12:42 AM      Result Value Range Status   MRSA by PCR NEGATIVE  NEGATIVE Final   Comment:            The GeneXpert MRSA Assay (FDA     approved for NASAL specimens     only), is one component of a     comprehensive MRSA colonization     surveillance program. It is not     intended to diagnose MRSA     infection nor to guide or     monitor treatment for     MRSA infections.     ANTIBIOTICS: Anti-infectives   None      SIGNIFICANT EVENTS / STUDIES:  03/10/2012  PFT >>> No obstructive / restrictive disease, moderately decreased DLCO but normal when corrected to lung volume 10/14/2012  TTE >>> Diastolic dysfunction, PAP 78 torr 10/16/12 - VQ - Low PROB for PE    INTERVAL HISTORY:   01/12/2013: Has had some  soft blood pressures requiring fluid in the emergency department but per patient this is her baseline. In addition pulse oximetry around 84% on 3 L nasal cannula which also patient thinks could be baseline. She did not use BiPAP overnight. Currently no complaints and is awaiting Doppler legs and 2-D echocardiogram   VITAL SIGNS: Temp:  [97.7 F (36.5 C)-99.3 F (37.4 C)] 97.7 F (36.5 C) (05/20 0751) Pulse Rate:  [74-85] 80 (05/20 0700) Resp:  [12-23] 17 (05/20 0700) BP: (76-115)/(40-67) 76/53 mmHg (05/20 0500) SpO2:  [67 %-97 %] 86 % (05/20 0700) FiO2 (%):  [70 %] 70 % (05/19 2219) Weight:  [102.513 kg (226 lb)-107.5 kg (236 lb 15.9 oz)] 107.5 kg (236 lb 15.9 oz) (05/20 0436)  HEMODYNAMICS:   VENTILATOR SETTINGS: Vent Mode:  [-]  FiO2 (%):  [70 %] 70 %  INTAKE / OUTPUT: Intake/Output     05/19 0701 - 05/20 0700 05/20 0701 - 05/21 0700   I.V. (mL/kg) 84 (0.8)    Total Intake(mL/kg) 84 (0.8)    Urine (mL/kg/hr) 150    Total Output 150     Net -  66            PHYSICAL EXAMINATION: General:  Obese,  no distress looks chronically ill Neuro:  Awake, alert, nonfocal. RASS sedation score 0. CAM-ICU negative for delirium  HEENT:  PERRL, BiPAP mask on Cardiovascular:  RRR, no m/r/g Lungs:  Bilateral diminished air entry, few rales, no w/r Abdomen:  Soft, nontender, bowel sounds diminished Musculoskeletal:  Moves all extremities, bilateral pitting LE edema Skin:  Intact  LABS: See individual assessment and plan  Imaging x48 hours Dg Chest Portable 1 View  01/11/2013   *RADIOLOGY REPORT*  Clinical Data: Shortness of breath.  PORTABLE CHEST - 1 VIEW  Comparison: 01/08/2013.  Findings: The heart is enlarged but stable.  The mediastinal and hilar contours are prominent but unchanged.  Mild central vascular congestion without overt pulmonary edema.  Underlying chronic lung changes are noted.  No pleural effusion.  IMPRESSION:  1.  Cardiac enlargement and mild central vascular congestion  without overt pulmonary edema. 2.  Underlying chronic lung changes.   Original Report Authenticated By: Rudie Meyer, M.D.    ASSESSMENT / PLAN:  PULMONARY  Recent Labs Lab 01/11/13 2115  PHART 7.335*  PCO2ART 38.7  PO2ART 73.0*  HCO3 20.6  TCO2 22  O2SAT 94.0    A:  Severe pulmonary hypertension (PAP 78 torr by TTE, primarty?) with R sided congestive heart failure.  OSA, non-compliant with CPAP.  Chronic hypoxia (baseline SpO2 88-90 on 3L [place]).  Acute on chronic hypoxemic renal failure. Possible pulmonary embolism.  - 01/12/2013: Clinically I feel that everything is chronic with acute worsening diastolic heart failure . She did have pulmonary embolism ruled out February 2014 but once again the possibility exists . She is refusing BiPAP and appears to be chronically hypoxemic   P:   -  check ABG - Await Doppler legs, 2-D echocardiogram and will obtain VQ scan once again - Supplemental oxygen PRN; okay to have pulse ox greater than 84%  -BiPAP as needed but she will probably refuse  - No indication for bronchodilators / systemic steroids - Empirical Heparin gtt  CARDIOVASCULAR  Recent Labs Lab 01/08/13 1510 01/11/13 2040 01/12/13 0025  PROBNP 2632.0* 5411.0* 4591.0*     Recent Labs Lab 01/12/13 0025 01/12/13 0520  TROPONINI <0.30 <0.30    A: Diastolic heart failure.  Hypotension.  -01/12/2013: She's able to tolerate diuresis with soft blood pressure  P:  Goal MAP>55 Hold Coreg, Cardizem, Losartan Continue lasix but keep eye on bp Continue ASA, Zetia, Pravachol  depending on course might need cardiology consultation  RENAL  Recent Labs Lab 01/08/13 1510 01/11/13 2040 01/12/13 0025  NA 139 135 133*  K 4.6 5.5* 5.3*  CL 104 97 98  CO2 25 21 19   GLUCOSE 237* 227* 196*  BUN 20 27* 28*  CREATININE 0.99 1.90* 1.76*  CALCIUM 9.8 9.8 9.2   Intake/Output     05/19 0701 - 05/20 0700 05/20 0701 - 05/21 0700   I.V. (mL/kg) 84 (0.8)    Total Intake(mL/kg) 84  (0.8)    Urine (mL/kg/hr) 150    Total Output 150     Net -66            A:  Acute on chronic renal failure.  Mild hyperkalemia. - 01/12/2013: Creatinine and potassium are improving  P:   Trend BMP Hold Preadmission Bumex  Continue lasix Hold K supplements   GASTROINTESTINAL  Recent Labs Lab 01/11/13 2040  AST 26  ALT 15  ALKPHOS  138*  BILITOT 0.6  PROT 6.5  ALBUMIN 3.8    A:  No active issues. P:   Heart healthy diet  Protonix as on Prevacid preadmission  HEMATOLOGIC  Recent Labs Lab 01/08/13 1510 01/11/13 2040 01/12/13 0025  HGB 14.8 15.7* 15.1*  HCT 45.1 47.3* 46.7*  WBC 6.9 10.6* 8.7  PLT 164 196 144*   No results found for this basename: INR,  in the last 168 hours  A:  Hemoconcentration.  Empirical anticoagulation for presumed pulmonary embolism. P:  Trend CBC Trend APTT per Pharmacy as on Heparin gtt  INFECTIOUS  Recent Labs Lab 01/11/13 2121 01/12/13 0024 01/12/13 0025 01/12/13 0513 01/12/13 0520  LATICACIDVEN 2.96* 1.8  --   --  1.3  PROCALCITON  --   --  <0.10 <0.10  --     Recent Labs Lab 01/08/13 1510 01/11/13 2040 01/12/13 0025  WBC 6.9 10.6* 8.7    A:  No evidence of acute infection. P:   PCT Monitor off antibiotics   ENDOCRINE  CBG (last 3)   Recent Labs  01/12/13 0104 01/12/13 0519 01/12/13 0735  GLUCAP 155* 143* 139*     A:  Morbid obesity.  DM.  Hyperglycemia.  Hypothyroidism.  P:   SSI Hold Linagliptin, Glipizide Synthroid  NEUROLOGIC A:  No active issues. P:   Hold Xanax for now   global  01/12/2013: Unclear if that are chronic issues and acute and chronic issues versus new issues. We'll keep her in the ICU one more day to sort this out     The patient is critically ill with multiple organ systems failure and requires high complexity decision making for assessment and support, frequent evaluation and titration of therapies, application of advanced monitoring technologies and extensive  interpretation of multiple databases.   Critical Care Time devoted to patient care services described in this note is 35 minutes.  Dr. Kalman Shan, M.D., [facility].C.P Pulmonary and Critical Care Medicine Staff Physician [facility] Pager: [phone], If no answer or between  15:00h - 7:00h: call [phone]  01/12/2013 9:03 AM

## 2013-01-12 NOTE — ED Provider Notes (Signed)
I reviewed and interpreted the EKG during the patient's evaluation in the ED and agree with the resident's interpretation. I saw and evaluated the patient, reviewed the resident's note and I agree with the findings and plan. CRITICAL CARE Performed by: Celene Kras Total critical care time: 35 Critical care time was exclusive of separately billable procedures and treating other patients. Critical care was necessary to treat or prevent imminent or life-threatening deterioration. Critical care was time spent personally by me on the following activities: development of treatment plan with patient and/or surrogate as well as nursing, discussions with consultants, evaluation of patient's response to treatment, examination of patient, obtaining history from patient or surrogate, ordering and performing treatments and interventions, ordering and review of laboratory studies, ordering and review of radiographic studies, pulse oximetry and re-evaluation of patient's condition.  The patient presented to the emergency room with acute shortness of breath. On exam she had some wheezing and crackles. Her chest x-ray did not show any evidence of significant pulmonary edema. Patient does have right-sided heart failure symptoms.  We were concerned about the possibility of pulmonary embolism. She was empirically started on heparin. The patient had borderline blood pressures. She didn't respond to gentle hydration however were concerned about the possibility of fluid overload. Patient was not stable enough to undergo any further imaging tests. She was placed on BiPAP with significant improvement of her respiratory difficulty. Pulmonary critical care was counseled to for admission.   Celene Kras, MD 01/12/13 [phone]

## 2013-01-12 NOTE — Care Management Note (Signed)
    Page 1 of 2   01/13/2013     11:45:47 AM   CARE MANAGEMENT NOTE 01/13/2013  Patient:  [name], [name]   Account Number:  [id number]  Date Initiated:  01/12/2013  Documentation initiated by:  [facility]  Subjective/Objective Assessment:   Admitted with resp failure - on bipap.  last admission dc'd with [facility] with [facility] - PT and NA     Action/Plan:   Anticipated DC Date:  01/19/2013   Anticipated DC Plan:  SKILLED NURSING FACILITY  In-house referral  Clinical Social Worker      DC Planning Services  CM consult      Choice offered to / List presented to:  C-1 Patient        HH arranged  HH-1 RN  HH-10 DISEASE MANAGEMENT  HH-2 PT  HH-4 NURSE'S AIDE      HH agency  [facility]   Status of service:  In process, will continue to follow Medicare Important Message given?   (If response is "NO", the following Medicare IM given date fields will be blank) Date Medicare IM given:   Date Additional Medicare IM given:    Discharge Disposition:    Per UR Regulation:  Reviewed for med. necessity/level of care/duration of stay  If discussed at Long Length of Stay Meetings, dates discussed:    Comments:  ContactErnie Avena Daughter [phone]  01-13-13 11:20am Avie Arenas, RNBSN - [phone] PT suggesting HH. patient sitting up in chair - oxygen on. Wants to return home with [facility] - is current with PT would also like HH RN and NA - Orders written for [facility] PT, RN and NA. Referral placed to [facility] at [facility].  01-12-13 8:40am Avie Arenas, RNBSN [phone] Readmission - was home with [facility] - ?? need for higher level initially on dc.  SW consult placed.

## 2013-01-12 NOTE — Progress Notes (Signed)
ANTICOAGULATION CONSULT NOTE - Follow Up Consult  Pharmacy Consult for Heparin Indication: r/o PE  Allergies  Allergen Reactions  . Diphenhydramine Hcl Other (See Comments)    Glaucoma prevents patient from taking this medication   . Aloe Vera Rash  . Penicillins Swelling and Rash   Patient Measurements: Height: 5\' 1"  (154.9 cm) Weight: 236 lb 15.9 oz (107.5 kg) IBW/kg (Calculated) : 47.8 Heparin Dosing Weight: 75 kg Vital Signs: Temp: 97.7 F (36.5 C) (05/20 0751) Temp src: Oral (05/20 0751) BP: 93/55 mmHg (05/20 0904) Pulse Rate: 82 (05/20 1000) Labs:  Recent Labs  01/11/13 2040 01/12/13 0025 01/12/13 0520 01/12/13 0859  HGB 15.7* 15.1*  --   --   HCT 47.3* 46.7*  --   --   PLT 196 144*  --   --   HEPARINUNFRC  --   --   --  0.35  CREATININE 1.90* 1.76*  --   --   TROPONINI  --  <0.30 <0.30  --    Estimated Creatinine Clearance: 31.3 ml/min (by C-G formula based on Cr of 1.76).  Medications:  Infusions:  . heparin 1,200 Units/hr (01/12/13 0004)   Assessment: 77 YOF admitted with acute SOB concerning for possible PE started on IV heparin. Heparin level is therapeutic at 0.35 on 1200 units/hr. As at low end of range, will increase slightly to maintain within goal and confirm with PM level. H/H stable. Platelets 144 (decreased this AM)- will monitor closely. No bleeding noted per RN.   Goal of Therapy:  Heparin level 0.3-0.7 units/ml Monitor platelets by anticoagulation protocol: Yes   Plan:  1. Increase heparin to 1250 units/hr.  2. Recheck heparin level in 8 hours.  3. Monitor CBC and heparin level daily.   Fayne Norrie 01/12/2013,10:17 AM

## 2013-01-12 NOTE — Progress Notes (Signed)
  Echocardiogram 2D Echocardiogram has been performed.  Emaly Boschert 01/12/2013, 11:12 AM 

## 2013-01-13 ENCOUNTER — Encounter: Payer: Self-pay | Admitting: Pharmacist Clinician (PhC)/ Clinical Pharmacy Specialist

## 2013-01-13 DIAGNOSIS — R7989 Other specified abnormal findings of blood chemistry: Secondary | ICD-10-CM

## 2013-01-13 LAB — CBC WITH DIFFERENTIAL/PLATELET
Eosinophils Absolute: 0.1 10*3/uL (ref 0.0–0.7)
Eosinophils Relative: 2 % (ref 0–5)
HCT: 41.4 % (ref 36.0–46.0)
Lymphocytes Relative: 24 % (ref 12–46)
Lymphs Abs: 1.3 10*3/uL (ref 0.7–4.0)
MCH: 29.4 pg (ref 26.0–34.0)
MCV: 88.8 fL (ref 78.0–100.0)
Monocytes Absolute: 0.7 10*3/uL (ref 0.1–1.0)
Monocytes Relative: 12 % (ref 3–12)
Platelets: 121 10*3/uL — ABNORMAL LOW (ref 150–400)
RBC: 4.66 MIL/uL (ref 3.87–5.11)
WBC: 5.6 10*3/uL (ref 4.0–10.5)

## 2013-01-13 LAB — GLUCOSE, CAPILLARY
Glucose-Capillary: 76 mg/dL (ref 70–99)
Glucose-Capillary: 115 mg/dL — ABNORMAL HIGH (ref 70–99)
Glucose-Capillary: 94 mg/dL (ref 70–99)
Glucose-Capillary: 169 mg/dL — ABNORMAL HIGH (ref 70–99)
Glucose-Capillary: 152 mg/dL — ABNORMAL HIGH (ref 70–99)

## 2013-01-13 LAB — BASIC METABOLIC PANEL
BUN: 29 mg/dL — ABNORMAL HIGH (ref 6–23)
Chloride: 100 mEq/L (ref 96–112)
Creatinine, Ser: 1.2 mg/dL — ABNORMAL HIGH (ref 0.50–1.10)
GFR calc Af Amer: 50 mL/min — ABNORMAL LOW (ref >90)
Glucose, Bld: 124 mg/dL — ABNORMAL HIGH (ref 70–99)

## 2013-01-13 LAB — URINE CULTURE

## 2013-01-13 MED ORDER — TRAVOPROST (BAK FREE) 0.004 % OP SOLN
1.0000 [drp] | Freq: Every day | OPHTHALMIC | Status: DC
  Administered 2013-01-13 – 2013-01-15 (×3): 1 [drp] via OPHTHALMIC

## 2013-01-13 MED ORDER — DEXTROSE 5 % IV SOLN: 1.0000 g | Freq: Once | INTRAVENOUS | Status: DC

## 2013-01-13 MED ORDER — MAGNESIUM SULFATE IN D5W 10-5 MG/ML-% IV SOLN
1.0000 g | Freq: Once | INTRAVENOUS | Status: AC
  Administered 2013-01-13: 1 g via INTRAVENOUS
  Filled 2013-01-13: qty 100

## 2013-01-13 MED ORDER — HEPARIN SODIUM (PORCINE) 5000 UNIT/ML IJ SOLN
5000.0000 [IU] | Freq: Two times a day (BID) | INTRAMUSCULAR | Status: DC
  Administered 2013-01-13 (×2): 5000 [IU] via SUBCUTANEOUS
  Filled 2013-01-13 (×8): qty 1

## 2013-01-13 MED ORDER — WHITE PETROLATUM GEL
Status: AC
  Administered 2013-01-13: 11:00:00
  Filled 2013-01-13: qty 5

## 2013-01-13 NOTE — Progress Notes (Signed)
Patient has been placed on telemetry; CMT has been notified.  Lorretta Harp RN

## 2013-01-13 NOTE — Progress Notes (Signed)
Patient has arrived to unit, report received; will continue to monitor patient. Lorretta Harp RN

## 2013-01-13 NOTE — Progress Notes (Signed)
Clinical Social Worker received referral for SNF.  CSW reviewed chart; PT currently recommending HH.  CSW to sign off, please re consult if needed.    Angelia Mould, MSW, [place] [phone]

## 2013-01-13 NOTE — Progress Notes (Signed)
Occupational Therapy Evaluation Patient Details Name: [name] MRN: [id number] DOB: 04-16-1935 Today's Date: 01/13/2013 Time: 1650-     OT Assessment / Plan / Recommendation Clinical Impression  78 yo admitted with respiratory distress and to r/o PE. PTA, Pt lived independently with brother and was independent with ADL and Mod I with mobility @ RW level.Pt will benefit from skilled OT services to facilitate D/C home with HHOT due to below deficits.will address AE and E conservation techniques.    OT Assessment  Patient needs continued OT Services    Follow Up Recommendations  Home health OT    Barriers to Discharge None    Equipment Recommendations  None recommended by OT    Recommendations for Other Services    Frequency  Min 2X/week    Precautions / Restrictions Precautions Precautions: None Restrictions Weight Bearing Restrictions: No   Pertinent Vitals/Pain desat to 83 4L O2. 2 min to rebound to 96. Dyspnea 2/4. No c/o pain    ADL  Equipment Used: Gait belt;Rolling walker Transfers/Ambulation Related to ADLs: S RW level ADL Comments: May benefit from AE    OT Diagnosis: Generalized weakness  OT Problem List: Decreased strength;Decreased activity tolerance;Decreased knowledge of use of DME or AE;Cardiopulmonary status limiting activity OT Treatment Interventions: Self-care/ADL training;Therapeutic exercise;Energy conservation;DME and/or AE instruction;Therapeutic activities;Patient/family education   OT Goals Acute Rehab OT Goals OT Goal Formulation: With patient Time For Goal Achievement: 01/27/13 Potential to Achieve Goals: Good ADL Goals Pt Will Transfer to Toilet: with modified independence;Ambulation;3-in-1;with DME ADL Goal: Toilet Transfer - Progress: Goal set today Additional ADL Goal #1: PT will verbalize 3 Energy conservation techniques for ADL ADL Goal: Additional Goal #1 - Progress: Goal set today Additional ADL Goal #2: Pt will use AE for LB  ADL to assess use to improve energy conservation techniques ADL Goal: Additional Goal #2 - Progress: Goal set today  Visit Information  Last OT Received On: 01/13/13 Assistance Needed: +1    Subjective Data      Prior Functioning     Home Living Lives With: Other (Comment) (lives with disabled brother) Available Help at Discharge: Family;Available PRN/intermittently (son lives nearby and daughter takes pt to store) Type of Home: Mobile home Home Access: Ramped entrance Home Layout: One level Bathroom Shower/Tub: Tub/shower unit;Other (comment) (pt sits on side of tub to bathe) Bathroom Toilet: Standard Bathroom Accessibility: Yes How Accessible: Accessible via walker Home Adaptive Equipment: Bedside commode/3-in-1;Hand-held shower hose;Walker - rolling;Walker - standard;Raised toilet seat with rails Prior Function Level of Independence: Independent with assistive device(s) Able to Take Stairs?: No Driving: No Vocation: Retired Musician: No difficulties Dominant Hand: Right         Vision/Perception     Cognition  Cognition Arousal/Alertness: Awake/alert Behavior During Therapy: WFL for tasks assessed/performed Overall Cognitive Status: Within Functional Limits for tasks assessed    Extremity/Trunk Assessment Right Upper Extremity Assessment RUE ROM/Strength/Tone: WFL for tasks assessed RUE Sensation: WFL - Light Touch;WFL - Proprioception RUE Coordination: WFL - gross/fine motor Left Upper Extremity Assessment LUE ROM/Strength/Tone: WFL for tasks assessed LUE Sensation: WFL - Light Touch;WFL - Proprioception LUE Coordination: WFL - gross/fine motor Right Lower Extremity Assessment RLE ROM/Strength/Tone: WFL for tasks assessed Left Lower Extremity Assessment LLE ROM/Strength/Tone: WFL for tasks assessed;Deficits LLE ROM/Strength/Tone Deficits: numbness and decreased great toe extension from remote fracture to toe LLE Sensation: WFL - Light  Touch;WFL - Proprioception Trunk Assessment Trunk Assessment: Other exceptions Trunk Exceptions: obese     Mobility Bed Mobility  Bed Mobility: Not assessed Details for Bed Mobility Assistance: Pt up in recliner upon arrival Transfers Sit to Stand: 4: Min assist;With upper extremity assist;From chair/3-in-1 Stand to Sit: 4: Min assist;With upper extremity assist;With armrests;To chair/3-in-1 Details for Transfer Assistance: pt with cues to push up with arms, assist to control descent into chair     Exercise     Balance Balance Balance Assessed:  (WFL for ADL) Static Sitting Balance Static Sitting - Level of Assistance: Not tested (comment) Static Standing Balance Static Standing - Balance Support: No upper extremity supported Static Standing - Level of Assistance: 5: Stand by assistance   End of Session OT - End of Session Equipment Utilized During Treatment: Gait belt Activity Tolerance: Patient tolerated treatment well Patient left: in bed;with call bell/phone within reach;with family/visitor present Nurse Communication: Mobility status  GO     Elyse Prevo,HILLARY 01/13/2013, 5:25 PM [facility], OTR/L  [phone] 01/13/2013

## 2013-01-13 NOTE — Progress Notes (Signed)
Mg 1.7 reported to eMD. Pt does not meet inclusion criteria for electrolyte replacement protocol secondary to no urine out put documented.

## 2013-01-13 NOTE — Progress Notes (Signed)
PULMONARY  / CRITICAL CARE MEDICINE  Name: [name] MRN: [id number] DOB: 1934-10-13    ADMISSION DATE:  01/11/2013 CONSULTATION DATE:  01/11/2013  REFERRING MD :  EDP PRIMARY SERVICE:  PCCM  CHIEF COMPLAINT:  Hypoxemic respiratory failure  BRIEF PATIENT DESCRIPTION: 78 yo with past medical history of OSA (non-compliant with CPAP), chronic hypoxemia (chronically 3L [place] with baseline saturation 88-90%), diastolic heart failure and pulmonary hypertension (PAP 78 torr by TTE) brought to [facility] ED with complaints of acute dyspnea..  In ED found to be hypoxic requiring NIMV/BiPAP.  PCCM was consulted.  LINES / TUBES:  CULTURES: Results for orders placed during the hospital encounter of 01/11/13  CULTURE, BLOOD (ROUTINE X 2)     Status: None   Collection Time    01/11/13  8:55 PM      Result Value Range Status   Specimen Description BLOOD ARM RIGHT   Final   Special Requests BOTTLES DRAWN AEROBIC AND ANAEROBIC 5CC   Final   Culture  Setup Time 01/12/2013 01:50   Final   Culture     Final   Value:        BLOOD CULTURE RECEIVED NO GROWTH TO DATE CULTURE WILL BE HELD FOR 5 DAYS BEFORE ISSUING A FINAL NEGATIVE REPORT   Report Status PENDING   Incomplete  GRAM STAIN     Status: None   Collection Time    01/11/13 10:21 PM      Result Value Range Status   Specimen Description URINE, RANDOM   Final   Special Requests NONE   Final   Gram Stain     Final   Value: WBC PRESENT,BOTH PMN AND MONONUCLEAR     GRAM POSITIVE COCCI IN PAIRS     GRAM POSITIVE RODS IN CHAINS     SQUAMOUS EPITHELIAL CELLS PRESENT     CYTOSPIN SLIDE   Report Status 01/11/2013 FINAL   Final  URINE CULTURE     Status: None   Collection Time    01/11/13 10:22 PM      Result Value Range Status   Specimen Description URINE, RANDOM   Final   Special Requests NONE   Final   Culture  Setup Time 01/12/2013 04:58   Final   Colony Count 50,000 COLONIES/ML   Final   Culture     Final   Value: Multiple bacterial morphotypes  present, none predominant. Suggest appropriate recollection if clinically indicated.   Report Status 01/13/2013 FINAL   Final  CULTURE, BLOOD (ROUTINE X 2)     Status: None   Collection Time    01/11/13 10:38 PM      Result Value Range Status   Specimen Description BLOOD HAND RIGHT   Final   Special Requests BOTTLES DRAWN AEROBIC ONLY 5CC   Final   Culture  Setup Time 01/12/2013 05:42   Final   Culture     Final   Value:        BLOOD CULTURE RECEIVED NO GROWTH TO DATE CULTURE WILL BE HELD FOR 5 DAYS BEFORE ISSUING A FINAL NEGATIVE REPORT   Report Status PENDING   Incomplete  MRSA PCR SCREENING     Status: None   Collection Time    01/12/13 12:42 AM      Result Value Range Status   MRSA by PCR NEGATIVE  NEGATIVE Final   Comment:            The GeneXpert MRSA Assay (FDA  approved for NASAL specimens     only), is one component of a     comprehensive MRSA colonization     surveillance program. It is not     intended to diagnose MRSA     infection nor to guide or     monitor treatment for     MRSA infections.     ANTIBIOTICS: Anti-infectives   None      SIGNIFICANT EVENTS / STUDIES:  03/10/2012  PFT >>> No obstructive / restrictive disease, moderately decreased DLCO but normal when corrected to lung volume 10/14/2012  TTE >>> Diastolic dysfunction, PAP 78 torr 10/16/12 - VQ - Low PROB for PE .................. 01/12/2013: VQ scan low probability for pulmonary embolism. Doppler negative for DVT -> heparin stopped 01/12/2013: Echo with severe right ventricle dilation but normal left ventricle ejection fraction     INTERVAL HISTORY: 01/13/2013: Improved blood pressure and respiratory status. She is not wearing CPAP. She says that she does not have sleep apnea as told her by a sleep specialist in [place] and she does not need CPAP.     VITAL SIGNS: Temp:  [97.5 F (36.4 C)-98.3 F (36.8 C)] 98.1 F (36.7 C) (05/21 0821) Pulse Rate:  [81-115] 100 (05/21 1000) Resp:   [13-27] 20 (05/21 1000) BP: (86-125)/(38-93) 125/80 mmHg (05/21 0900) SpO2:  [83 %-93 %] 87 % (05/21 1000)  HEMODYNAMICS:   VENTILATOR SETTINGS:    INTAKE / OUTPUT: Intake/Output     05/20 0701 - 05/21 0700 05/21 0701 - 05/22 0700   P.O. 640    I.V. (mL/kg) 770.6 (7.2) 32.5 (0.3)   Total Intake(mL/kg) 1410.6 (13.1) 32.5 (0.3)   Urine (mL/kg/hr) 1625 (0.6)    Total Output 1625     Net -214.4 +32.5          PHYSICAL EXAMINATION: General:  Obese,  no distress looks chronically ill. Looks improved compared to yesterday Neuro:  Awake, alert, nonfocal. RASS sedation score 0. CAM-ICU negative for delirium  HEENT:  PERRL, BiPAP mask on Cardiovascular:  RRR, no m/r/g Lungs:  Bilateral diminished air entry, few rales, no w/r Abdomen:  Soft, nontender, bowel sounds diminished Musculoskeletal:  Moves all extremities, bilateral pitting LE edema Skin:  Intact  LABS: See individual assessment and plan  Imaging x48 hours Nm Pulmonary Perf And Vent  01/12/2013   *RADIOLOGY REPORT*  Clinical Data: Shortness of breath  NM PULMONARY VENTILATION AND PERFUSION SCAN  Views:  Anterior, posterior, right lateral, left lateral, RPO, LPO, RAO, LAO - ventilation and perfusion  Radiopharmaceutical: Technetium 56m DTPA - ventilation; technetium 74m macroaggregated albumin - perfusion  Dose:  40.0 mCi - ventilation; 6.0 mCi - perfusion  Route of administration:  Inhalation - ventilation; intravenous - perfusion  Comparison: Chest radiograph Jan 11, 2013  Findings: There is homogeneous and symmetric uptake of radiotracer on the ventilation study.  On the perfusion study, uptake of radiotracer is homogeneous and symmetric bilaterally.  There is no appreciable ventilation / perfusion mismatch.  IMPRESSION: Essentially normal ventilation and perfusion lung scans.  Very low probability of pulmonary embolus.   Original Report Authenticated By: Bretta Bang, M.D.   Dg Chest Portable 1 View  01/11/2013    *RADIOLOGY REPORT*  Clinical Data: Shortness of breath.  PORTABLE CHEST - 1 VIEW  Comparison: 01/08/2013.  Findings: The heart is enlarged but stable.  The mediastinal and hilar contours are prominent but unchanged.  Mild central vascular congestion without overt pulmonary edema.  Underlying chronic lung changes are noted.  No pleural effusion.  IMPRESSION:  1.  Cardiac enlargement and mild central vascular congestion without overt pulmonary edema. 2.  Underlying chronic lung changes.   Original Report Authenticated By: Rudie Meyer, M.D.    ASSESSMENT / PLAN:  PULMONARY  Recent Labs Lab 01/11/13 2115 01/12/13 0923  PHART 7.335* 7.326*  PCO2ART 38.7 43.1  PO2ART 73.0* 49.0*  HCO3 20.6 22.5  TCO2 22 24  O2SAT 94.0 81.0    A:  Severe pulmonary hypertension (PAP 78 torr by TTE, primarty?) with R sided congestive heart failure.  OSA, non-compliant with CPAP.  Chronic hypoxia (baseline SpO2 88-90 on 3L [place]).  Acute on chronic hypoxemic renal failure. Possible pulmonary embolism.  - 01/13/2013: Clinically I feel that everything is chronic with acute worsening diastolic heart failure and cor pulmonale. Pulmonary embolism not likely and heparin stop order given yesterday  P:   -  DC heparin  -  Supplemental oxygen PRN; okay to have pulse ox greater than 84%  -- No indication for bronchodilators / systemic steroids - needs opd sleep wporkup eval ? In [place] versus [place]  CARDIOVASCULAR  Recent Labs Lab 01/08/13 1510 01/11/13 2040 01/12/13 0025  PROBNP 2632.0* 5411.0* 4591.0*      Recent Labs Lab 01/12/13 0025 01/12/13 0520 01/12/13 1315  TROPONINI <0.30 <0.30 <0.30    A: Diastolic heart failure.  Hypotension.  -01/12/2013: She's able to tolerate diuresis with soft blood pressure  - 01/13/13- BP ok on lasix  P:  Goal MAP>55 Hold Coreg, Cardizem, Losartan Continue lasix but keep eye on bp Continue ASA, Zetia, Pravachol  depending on course might need cardiology  consultation  RENAL  Recent Labs Lab 01/08/13 1510 01/11/13 2040 01/12/13 0025 01/13/13 0415  NA 139 135 133* 135  K 4.6 5.5* 5.3* 4.0  CL 104 97 98 100  CO2 25 21 19 22   GLUCOSE 237* 227* 196* 124*  BUN 20 27* 28* 29*  CREATININE 0.99 1.90* 1.76* 1.20*  CALCIUM 9.8 9.8 9.2 8.8  MG  --   --   --  1.7  PHOS  --   --   --  4.4   Intake/Output     05/20 0701 - 05/21 0700 05/21 0701 - 05/22 0700   P.O. 640    I.V. (mL/kg) 770.6 (7.2) 32.5 (0.3)   Total Intake(mL/kg) 1410.6 (13.1) 32.5 (0.3)   Urine (mL/kg/hr) 1625 (0.6)    Total Output 1625     Net -214.4 +32.5          A:  Acute on chronic renal failure.  Mild hyperkalemia. - 01/13/2013: Creatinine improving . Mild low mag  P:   Trend BMP Hold Preadmission Bumex  Continue lasix Hold K supplements   GASTROINTESTINAL  Recent Labs Lab 01/11/13 2040  AST 26  ALT 15  ALKPHOS 138*  BILITOT 0.6  PROT 6.5  ALBUMIN 3.8    A:  No active issues. P:   Heart healthy diet  Protonix as on Prevacid preadmission  HEMATOLOGIC  Recent Labs Lab 01/11/13 2040 01/12/13 0025 01/13/13 0415  HGB 15.7* 15.1* 13.7  HCT 47.3* 46.7* 41.4  WBC 10.6* 8.7 5.6  PLT 196 144* 121*   No results found for this basename: INR,  in the last 168 hours  A:  Mild low platelet ? Due to heparin  P:  Trend CBC off heparin    INFECTIOUS  Recent Labs Lab 01/12/13 0024 01/12/13 0025 01/12/13 0513 01/12/13 0520 01/12/13 1315 01/13/13 0415  LATICACIDVEN 1.8  --   --  1.3 1.8  --   PROCALCITON  --  <0.10 <0.10  --   --  <0.10    Recent Labs Lab 01/08/13 1510 01/11/13 2040 01/12/13 0025 01/13/13 0415  WBC 6.9 10.6* 8.7 5.6    A:  No evidence of acute infection. P:   PCT Monitor off antibiotics   ENDOCRINE  CBG (last 3)   Recent Labs  01/12/13 2345 01/13/13 0421 01/13/13 0820  GLUCAP 161* 115* 94     A:  Morbid obesity.  DM.  Hyperglycemia.  Hypothyroidism.  P:   SSI Hold Linagliptin,  Glipizide Synthroid  NEUROLOGIC A:  No active issues. P:   Hold Xanax for now   global  01/12/2013: Unclear if that are chronic issues and acute and chronic issues versus new issues. We'll keep her in the ICU one more day to sort this out   01/13/13 - tx to tele under PCCM. Needs sleep apneal issues sorted.      Dr. Kalman Shan, M.D., [facility].C.P Pulmonary and Critical Care Medicine Staff Physician  System [place] [facility] Pager: [phone], If no answer or between  15:00h - 7:00h: call [phone]  01/13/2013 10:55 AM

## 2013-01-13 NOTE — Progress Notes (Signed)
Physical Therapy Evaluation Patient Details Name: [name] MRN: [id number] DOB: 1934/10/30 Today's Date: 01/13/2013 Time: 4098-1191 PT Time Calculation (min): 22 min  PT Assessment / Plan / Recommendation Clinical Impression  Pt admit with acute respiratory failure and r/o PE.  Will benefit from PT to address endurance issues.  Will need HHPT f/u.  Continue PT to incr endurance.      PT Assessment  Patient needs continued PT services    Follow Up Recommendations  Home health PT;Other (comment) (HH aide)                Equipment Recommendations  None recommended by PT         Frequency Min 3X/week    Precautions / Restrictions Precautions Precautions: None Restrictions Weight Bearing Restrictions: No   Pertinent Vitals/Pain VSS, no pain      Mobility  Bed Mobility Bed Mobility: Not assessed Details for Bed Mobility Assistance: Pt up in recliner upon arrival Transfers Transfers: Sit to Stand;Stand to Sit Sit to Stand: 4: Min assist;With upper extremity assist;From chair/3-in-1 Stand to Sit: 4: Min assist;With upper extremity assist;With armrests;To chair/3-in-1 Details for Transfer Assistance: pt with cues to push up with arms, assist to control descent into chair Ambulation/Gait Ambulation/Gait Assistance: 4: Min guard Ambulation Distance (Feet): 120 Feet Assistive device: Rolling walker Ambulation/Gait Assistance Details: cues needed to slow down and maintain pursed lip breathing.  DOE 3/4.  Pt ambulates safely with RW.  Came back to room and use 3N1 prior to return to chair. Gait Pattern: Step-through pattern;Decreased stride length Stairs: No Wheelchair Mobility Wheelchair Mobility: No         PT Diagnosis: Difficulty walking;Generalized weakness  PT Problem List: Decreased strength;Decreased activity tolerance;Decreased balance;Decreased safety awareness;Cardiopulmonary status limiting activity;Obesity;Decreased mobility PT Treatment Interventions:  DME instruction;Gait training;Functional mobility training;Therapeutic activities;Therapeutic exercise;Balance training;Patient/family education   PT Goals Acute Rehab PT Goals PT Goal Formulation: With patient Time For Goal Achievement: 01/27/13 Potential to Achieve Goals: Good Pt will go Supine/Side to Sit: Independently PT Goal: Supine/Side to Sit - Progress: Goal set today Pt will go Sit to Supine/Side: Independently PT Goal: Sit to Supine/Side - Progress: Goal set today Pt will go Sit to Stand: Independently PT Goal: Sit to Stand - Progress: Goal set today Pt will go Stand to Sit: Independently PT Goal: Stand to Sit - Progress: Goal set today Pt will Ambulate: >150 feet;with modified independence PT Goal: Ambulate - Progress: Goal set today  Visit Information  Last PT Received On: 01/13/13 Assistance Needed: +1    Subjective Data  Subjective: "I want to try and walk." Patient Stated Goal: to go home and get HHPT   Prior Functioning  Home Living Lives With: Other (Comment) (lives with disabled brother) Available Help at Discharge: Family;Available PRN/intermittently (son lives nearby and daughter takes pt to store) Type of Home: Mobile home Home Access: Ramped entrance Home Layout: One level Bathroom Shower/Tub: Tub/shower unit;Other (comment) (pt sits on side of tub to bathe) Bathroom Toilet: Standard Home Adaptive Equipment: Bedside commode/3-in-1;Hand-held shower hose;Walker - rolling;Walker - standard;Raised toilet seat with rails Prior Function Level of Independence: Independent with assistive device(s) Able to Take Stairs?: No Driving: No Vocation: Retired Musician: No difficulties Dominant Hand: Right    Cognition  Cognition Arousal/Alertness: Awake/alert Behavior During Therapy: WFL for tasks assessed/performed Overall Cognitive Status: Within Functional Limits for tasks assessed    Extremity/Trunk Assessment Right Lower Extremity  Assessment RLE ROM/Strength/Tone: [name] for tasks assessed Left Lower Extremity Assessment  LLE ROM/Strength/Tone: [facility] for tasks assessed;Deficits LLE ROM/Strength/Tone Deficits: numbness and decreased great toe extension from remote fracture to toe LLE Sensation: WFL - Light Touch;WFL - Proprioception Trunk Assessment Trunk Assessment: Other exceptions Trunk Exceptions: obese   Balance Static Sitting Balance Static Sitting - Level of Assistance: Not tested (comment) Static Standing Balance Static Standing - Balance Support: No upper extremity supported Static Standing - Level of Assistance: 5: Stand by assistance Static Standing - Comment/# of Minutes: limited endurance.  Cleaned self when stood from 3N1  End of Session PT - End of Session Equipment Utilized During Treatment: Gait belt;Oxygen Activity Tolerance: Patient tolerated treatment well Patient left: in chair;with call bell/phone within reach Nurse Communication: Mobility status       INGOLD,Montravious Weigelt 01/13/2013, 10:04 AM [facility] [phone] (pager)

## 2013-01-14 LAB — CBC
Hemoglobin: 14 g/dL (ref 12.0–15.0)
Platelets: 126 10*3/uL — ABNORMAL LOW (ref 150–400)
RBC: 4.82 MIL/uL (ref 3.87–5.11)
WBC: 6.3 10*3/uL (ref 4.0–10.5)

## 2013-01-14 LAB — BASIC METABOLIC PANEL
CO2: 24 mEq/L (ref 19–32)
Chloride: 101 mEq/L (ref 96–112)
Glucose, Bld: 119 mg/dL — ABNORMAL HIGH (ref 70–99)
Sodium: 138 mEq/L (ref 135–145)

## 2013-01-14 LAB — GLUCOSE, CAPILLARY
Glucose-Capillary: 121 mg/dL — ABNORMAL HIGH (ref 70–99)
Glucose-Capillary: 135 mg/dL — ABNORMAL HIGH (ref 70–99)

## 2013-01-14 LAB — PHOSPHORUS: Phosphorus: 3.3 mg/dL (ref 2.3–4.6)

## 2013-01-14 LAB — MAGNESIUM: Magnesium: 1.9 mg/dL (ref 1.5–2.5)

## 2013-01-14 MED ORDER — ALPRAZOLAM 0.25 MG PO TABS
0.2500 mg | ORAL_TABLET | Freq: Once | ORAL | Status: AC
  Administered 2013-01-14: 0.25 mg via ORAL
  Filled 2013-01-14: qty 1

## 2013-01-14 NOTE — Clinical Documentation Improvement (Signed)
CHF DOCUMENTATION CLARIFICATION QUERY  CLINICAL DOCUMENTATION QUERIES ARE NOT PART OF THE PERMANENT MEDICAL RECORD   Please update your documentation within the medical record to reflect your response to this query.                                                                                     01/14/13  Dr. Delton Coombes,  In a better effort to capture your patient's severity of illness, reflect appropriate length of stay and utilization of resources, a review of the patient medical record has revealed the following information:  A: Diastolic heart failure. Hypotension.  P:  Goal MAP>55  Hold Coreg, Cardizem, Losartan  Continue lasix but keep eye on bp > tolerating so far, negative 4.2L overall  Continue ASA, Zetia, Pravachol  Original Note by Candie Chroman, NP filed at 01/14/2013 12:01 PM    Based on your clinical judgment, please document the ACUITY of the Diastolic Heart Failure monitored and treated this admission in the progress notes and discharge summary:  ACUITY:  - Acute  - Acute on Chronic  - Chronic    In responding to this query please exercise your independent judgment.     The fact that a query is asked, does not imply that any particular answer is desired or expected.    Reviewed: 01/20/13 - query not addressed in pn or dc summary.  Asher Muir has posted RQ.  Mathis Dad RN.  Thank You,  Jerral Ralph  RN BSN CCDS Certified Clinical Documentation Specialist: Cell   [phone]  Health Information Management [place]   TO RESPOND TO THE THIS QUERY, FOLLOW THE INSTRUCTIONS BELOW:  1. If needed, update documentation for the patient's encounter via the notes activity.  2. Access this query again and click edit on the In [employer].  3. After updating, or not, click F2 to complete all highlighted (required) fields concerning your review. Select "additional documentation in the medical record" OR "no additional documentation provided".   4. Click Sign note button.  5. The deficiency will fall out of your In Basket *Please let us know if you are not able to complete this workflow by phone or e-mail (listed below).

## 2013-01-14 NOTE — Progress Notes (Signed)
PULMONARY  / CRITICAL CARE MEDICINE  Name: [name] MRN: [id number] DOB: 08/09/1935    ADMISSION DATE:  01/11/2013 CONSULTATION DATE:  01/11/2013  REFERRING MD :  EDP PRIMARY SERVICE:  PCCM  CHIEF COMPLAINT:  Hypoxemic respiratory failure  BRIEF PATIENT DESCRIPTION: 78 yo with past medical history of OSA (non-compliant with CPAP), Negative sleep study 5-13,chronic hypoxemia (chronically 3L [place] with baseline saturation 88-90%), diastolic heart failure and pulmonary hypertension (PAP 78 torr by TTE) brought to [facility] ED with complaints of acute dyspnea..  In ED found to be hypoxic requiring NIMV/BiPAP.  PCCM was consulted.  LINES / TUBES:  CULTURES: 5-19 bcx2>> 5-19 uc >> multiple morphologies  ANTIBIOTICS:  SIGNIFICANT EVENTS / STUDIES:  03/10/2012  PFT >>> No obstructive / restrictive disease, moderately decreased DLCO but normal when corrected to lung volume 10/14/2012  TTE >>> Diastolic dysfunction, PAP 78 torr 10/16/12 - VQ - Low PROB for PE .................. 01/12/2013: VQ scan low probability for pulmonary embolism. Doppler negative for DVT -> heparin stopped 01/12/2013: Echo with severe right ventricle dilation but normal left ventricle ejection fraction   INTERVAL HISTORY: Not wearing CPAP    VITAL SIGNS: Temp:  [98 F (36.7 C)-98.4 F (36.9 C)] 98 F (36.7 C) (05/22 1030) Pulse Rate:  [81-99] 81 (05/22 1030) Resp:  [18-23] 18 (05/22 1030) BP: (114-132)/(63-80) 117/78 mmHg (05/22 1030) SpO2:  [90 %-93 %] 91 % (05/22 1030) Weight:  [103.012 kg (227 lb 1.6 oz)-105.28 kg (232 lb 1.6 oz)] 103.012 kg (227 lb 1.6 oz) (05/22 0440)  HEMODYNAMICS:   VENTILATOR SETTINGS:    INTAKE / OUTPUT: Intake/Output     05/21 0701 - 05/22 0700 05/22 0701 - 05/23 0700   P.O. 240 120   I.V. (mL/kg) 32.5 (0.3)    Total Intake(mL/kg) 272.5 (2.6) 120 (1.2)   Urine (mL/kg/hr) 2900 (1.2)    Stool 1 (0)    Total Output 2901     Net -2628.5 +120          PHYSICAL  EXAMINATION: General:  Obese,  no distress looks chronically ill. Neuro:  Awake, alert, nonfocal.   HEENT:  PERRL,  Cardiovascular:  RRR, no m/r/g Lungs:  Bilateral diminished air entry, few rales, no w/r Abdomen:  Soft, nontender, bowel sounds diminished Musculoskeletal:  Moves all extremities, bilateral pitting LE edema Skin:  Intact    Imaging x48 hours Nm Pulmonary Perf And Vent  01/12/2013   *RADIOLOGY REPORT*  Clinical Data: Shortness of breath  NM PULMONARY VENTILATION AND PERFUSION SCAN  Views:  Anterior, posterior, right lateral, left lateral, RPO, LPO, RAO, LAO - ventilation and perfusion  Radiopharmaceutical: Technetium 88m DTPA - ventilation; technetium 37m macroaggregated albumin - perfusion  Dose:  40.0 mCi - ventilation; 6.0 mCi - perfusion  Route of administration:  Inhalation - ventilation; intravenous - perfusion  Comparison: Chest radiograph Jan 11, 2013  Findings: There is homogeneous and symmetric uptake of radiotracer on the ventilation study.  On the perfusion study, uptake of radiotracer is homogeneous and symmetric bilaterally.  There is no appreciable ventilation / perfusion mismatch.  IMPRESSION: Essentially normal ventilation and perfusion lung scans.  Very low probability of pulmonary embolus.   Original Report Authenticated By: Bretta Bang, M.D.    ASSESSMENT / PLAN:  PULMONARY  Recent Labs Lab 01/11/13 2115 01/12/13 0923  PHART 7.335* 7.326*  PCO2ART 38.7 43.1  PO2ART 73.0* 49.0*  HCO3 20.6 22.5  TCO2 22 24  O2SAT 94.0 81.0    A:  Severe pulmonary  hypertension (PAP 78 torr by TTE) with R sided congestive heart failure.  OHS, not on NIPPV. PSG from 6/'13 > no OSA.  Chronic hypoxia (baseline SpO2 88-90 on 3L [place]).  Acute on chronic hypoxemic renal failure. No pulmonary embolism by V/Q.  P:   -  Supplemental oxygen - No indication for bronchodilators / systemic steroids - consider repeat PSG, ? Central apneas or benefit from BiPAP. Will need to  discuss w Dr Craige Cotta  CARDIOVASCULAR  Recent Labs Lab 01/08/13 1510 01/11/13 2040 01/12/13 0025  PROBNP 2632.0* 5411.0* 4591.0*      Recent Labs Lab 01/12/13 0025 01/12/13 0520 01/12/13 1315  TROPONINI <0.30 <0.30 <0.30    A: Diastolic heart failure.  Hypotension.  P:  Goal MAP>55 Hold Coreg, Cardizem, Losartan Continue lasix but keep eye on bp > tolerating so far, negative 4.2L overall Continue ASA, Zetia, Pravachol  RENAL  Recent Labs Lab 01/08/13 1510 01/11/13 2040 01/12/13 0025 01/13/13 0415 01/14/13 0505  NA 139 135 133* 135 138  K 4.6 5.5* 5.3* 4.0 4.1  CL 104 97 98 100 101  CO2 25 21 19 22 24   GLUCOSE 237* 227* 196* 124* 119*  BUN 20 27* 28* 29* 21  CREATININE 0.99 1.90* 1.76* 1.20* 0.88  CALCIUM 9.8 9.8 9.2 8.8 9.7  MG  --   --   --  1.7 1.9  PHOS  --   --   --  4.4 3.3   Intake/Output     05/21 0701 - 05/22 0700 05/22 0701 - 05/23 0700   P.O. 240 120   I.V. (mL/kg) 32.5 (0.3)    Total Intake(mL/kg) 272.5 (2.6) 120 (1.2)   Urine (mL/kg/hr) 2900 (1.2)    Stool 1 (0)    Total Output 2901     Net -2628.5 +120          A:  Acute on chronic renal failure.  Mild hyperkalemia. Lab Results  Component Value Date   CREATININE 0.88 01/14/2013   CREATININE 1.20* 01/13/2013   CREATININE 1.76* 01/12/2013    Recent Labs Lab 01/12/13 0025 01/13/13 0415 01/14/13 0505  K 5.3* 4.0 4.1   P:   Trend BMP Holding Preadmission Bumex  Continue lasix as ordered and follow UOP, S Cr Hold K supplements   GASTROINTESTINAL  Recent Labs Lab 01/11/13 2040  AST 26  ALT 15  ALKPHOS 138*  BILITOT 0.6  PROT 6.5  ALBUMIN 3.8    A:  No active issues. P:   Heart healthy diet  Protonix as on Prevacid preadmission  HEMATOLOGIC  Recent Labs Lab 01/12/13 0025 01/13/13 0415 01/14/13 0505  HGB 15.1* 13.7 14.0  HCT 46.7* 41.4 42.6  WBC 8.7 5.6 6.3  PLT 144* 121* 126*   No results found for this basename: INR,  in the last 168 hours  A:  Mild low  platelet ? Due to heparin  P:  Heparin restarted 5/21   INFECTIOUS  A:  No evidence of acute infection. P:   Monitor off antibiotics   ENDOCRINE  CBG (last 3)   Recent Labs  01/13/13 2046 01/14/13 0044 01/14/13 0439  GLUCAP 152* 135* 121*     A:  Morbid obesity.  DM.  Hyperglycemia.  Hypothyroidism.   Recent Labs  01/13/13 2046 01/14/13 0044 01/14/13 0439  GLUCAP 152* 135* 121*   tsh 1.2   P:   SSI Hold Linagliptin, Glipizide Synthroid  NEUROLOGIC A:  No active issues. P:   Hold Xanax for now  Brett Canales Minor ACNP [facility] PCCM Pager [phone] till 3 pm If no answer page [phone] 01/14/2013, 11:53 AM   Levy Pupa, MD, PhD 01/14/2013, 2:58 PM [facility] [phone] or if no answer [phone]

## 2013-01-15 LAB — GLUCOSE, CAPILLARY
Glucose-Capillary: 119 mg/dL — ABNORMAL HIGH (ref 70–99)
Glucose-Capillary: 123 mg/dL — ABNORMAL HIGH (ref 70–99)
Glucose-Capillary: 202 mg/dL — ABNORMAL HIGH (ref 70–99)
Glucose-Capillary: 137 mg/dL — ABNORMAL HIGH (ref 70–99)

## 2013-01-15 LAB — BASIC METABOLIC PANEL
BUN: 16 mg/dL (ref 6–23)
Calcium: 9.8 mg/dL (ref 8.4–10.5)
Chloride: 99 mEq/L (ref 96–112)
Creatinine, Ser: 0.8 mg/dL (ref 0.50–1.10)
GFR calc Af Amer: 82 mL/min — ABNORMAL LOW (ref >90)
GFR calc non Af Amer: 70 mL/min — ABNORMAL LOW (ref >90)

## 2013-01-15 LAB — CBC
HCT: 43.7 % (ref 36.0–46.0)
MCH: 28.8 pg (ref 26.0–34.0)
MCHC: 32.3 g/dL (ref 30.0–36.0)
MCV: 89.2 fL (ref 78.0–100.0)
RDW: 14.3 % (ref 11.5–15.5)

## 2013-01-15 MED ORDER — INSULIN ASPART 100 UNIT/ML ~~LOC~~ SOLN
0.0000 [IU] | Freq: Three times a day (TID) | SUBCUTANEOUS | Status: DC
  Administered 2013-01-15: 3 [IU] via SUBCUTANEOUS
  Administered 2013-01-16: 4 [IU] via SUBCUTANEOUS

## 2013-01-15 MED ORDER — GLIPIZIDE 10 MG PO TABS
10.0000 mg | ORAL_TABLET | Freq: Two times a day (BID) | ORAL | Status: DC
Start: 2013-01-15 — End: 2013-01-16
  Administered 2013-01-15 – 2013-01-16 (×2): 10 mg via ORAL
  Filled 2013-01-15 (×4): qty 1

## 2013-01-15 MED ORDER — CARVEDILOL 6.25 MG PO TABS
6.2500 mg | ORAL_TABLET | Freq: Two times a day (BID) | ORAL | Status: DC
  Administered 2013-01-15 – 2013-01-16 (×2): 6.25 mg via ORAL
  Filled 2013-01-15 (×4): qty 1

## 2013-01-15 MED ORDER — ALPRAZOLAM 0.25 MG PO TABS
0.2500 mg | ORAL_TABLET | Freq: Once | ORAL | Status: AC
  Administered 2013-01-15: 0.25 mg via ORAL
  Filled 2013-01-15: qty 1

## 2013-01-15 MED ORDER — BUMETANIDE 1 MG PO TABS
1.0000 mg | ORAL_TABLET | Freq: Two times a day (BID) | ORAL | Status: DC
  Administered 2013-01-15 – 2013-01-16 (×2): 1 mg via ORAL
  Filled 2013-01-15 (×4): qty 1

## 2013-01-15 MED ORDER — LINAGLIPTIN 5 MG PO TABS
5.0000 mg | ORAL_TABLET | Freq: Every day | ORAL | Status: DC
  Administered 2013-01-15 – 2013-01-16 (×2): 5 mg via ORAL
  Filled 2013-01-15 (×2): qty 1

## 2013-01-15 NOTE — Progress Notes (Signed)
Utilization Review Completed Bridney Guadarrama J. Tieshia Rettinger, RN, BSN, NCM [phone]  

## 2013-01-15 NOTE — Progress Notes (Signed)
Physical Therapy Treatment Patient Details Name: [name] MRN: [id number] DOB: December 03, 1934 Today's Date: 01/15/2013 Time: 1191-4782 PT Time Calculation (min): 13 min  PT Assessment / Plan / Recommendation Comments on Treatment Session  Pt very pleasant & willing to participate in therapy.  Increased ambulation distance but still presents with SOB with exertion.  3/4 dyspnea.      Follow Up Recommendations  Home health PT;Other (comment) [facility] aide)     Does the patient have the potential to tolerate intense rehabilitation     Barriers to Discharge        Equipment Recommendations  None recommended by PT    Recommendations for Other Services    Frequency Min 3X/week   Plan Discharge plan remains appropriate    Precautions / Restrictions Precautions Precautions: None Restrictions Weight Bearing Restrictions: No   Pertinent Vitals/Pain 02 sats dropped to 88% 3L with ambulation; increased to 4L with sats remaining 91-92% remainder of ambulation.      Mobility  Bed Mobility Bed Mobility: Not assessed Details for Bed Mobility Assistance: Pt up in chair upon arrival Transfers Transfers: Sit to Stand;Stand to Sit Sit to Stand: 6: Modified independent (Device/Increase time);With upper extremity assist;With armrests;From chair/3-in-1 Stand to Sit: 6: Modified independent (Device/Increase time);With upper extremity assist;With armrests;To chair/3-in-1 Ambulation/Gait Ambulation/Gait Assistance: 5: Supervision Ambulation Distance (Feet): 160 Feet Assistive device: Rolling walker Ambulation/Gait Assistance Details: cues to reinforce pursed lip breathing & encouragement to take standing rest break due to SOB.  3/4 dyspnea.   Gait Pattern: Step-through pattern;Decreased stride length Stairs: No Wheelchair Mobility Wheelchair Mobility: No      PT Goals Acute Rehab PT Goals Time For Goal Achievement: 01/27/13 Potential to Achieve Goals: Good Pt will go Supine/Side to Sit:  Independently Pt will go Sit to Supine/Side: Independently Pt will go Sit to Stand: Independently PT Goal: Sit to Stand - Progress: Progressing toward goal Pt will go Stand to Sit: Independently PT Goal: Stand to Sit - Progress: Progressing toward goal Pt will Ambulate: >150 feet;with modified independence PT Goal: Ambulate - Progress: Progressing toward goal  Visit Information  Last PT Received On: 01/15/13 Assistance Needed: +1    Subjective Data      Cognition  Cognition Arousal/Alertness: Awake/alert Behavior During Therapy: WFL for tasks assessed/performed Overall Cognitive Status: Within Functional Limits for tasks assessed    Balance  Static Standing Balance Static Standing - Balance Support: No upper extremity supported Static Standing - Level of Assistance: 6: Modified independent (Device/Increase time) Static Standing - Comment/# of Minutes: 1 minute  End of Session PT - End of Session Equipment Utilized During Treatment: Gait belt;Oxygen Activity Tolerance: Patient tolerated treatment well Patient left: in chair;with call bell/phone within reach Nurse Communication: Mobility status     Verdell Face, [place] [phone] 01/15/2013

## 2013-01-15 NOTE — Progress Notes (Signed)
Occupational Therapy Treatment Patient Details Name: [name] MRN: [id number] DOB: Oct 11, 1934 Today's Date: 01/15/2013 Time: 4098-1191 OT Time Calculation (min): 19 min  OT Assessment / Plan / Recommendation Comments on Treatment Session This 78 yo female admiited with respiratory distress and to r/o PE. PTA, Pt lived independently with brother and was independent with ADL and Mod I with mobility @ RW level.Pt will benefit from skilled OT services to facilitate D/C home with HHOT. Pt is making excellent progress.    Follow Up Recommendations  Home health OT       Equipment Recommendations  None recommended by OT       Frequency Min 2X/week   Plan Discharge plan remains appropriate    Precautions / Restrictions Precautions Precautions: None Restrictions Weight Bearing Restrictions: No   Pertinent Vitals/Pain Sats dropped to 88% on 3 liters after going to the bathroom, washing hands at sink, and back to chair. Returned to 91 % with <30 seconds of purse lipped breathing.    ADL  Grooming: Performed;Supervision/safety Where Assessed - Grooming: Unsupported standing Lower Body Dressing: Performed;Supervision/safety (with AE) Where Assessed - Lower Body Dressing: Unsupported sit to stand Toilet Transfer: Performed;Supervision/safety Toilet Transfer Method: Sit to Barista: Raised toilet seat with arms (or 3-in-1 over toilet) Toileting - Clothing Manipulation and Hygiene: Performed;Independent Where Assessed - Toileting Clothing Manipulation and Hygiene: Standing Transfers/Ambulation Related to ADLs: S for all at RW level ADL Comments: Educated pt on AE use and let her return demo with doff/don socks; made her aware where she could get the sock aid and a long handled sponge      OT Goals ADL Goals ADL Goal: Toilet Transfer - Progress: Progressing toward goals ADL Goal: Additional Goal #2 - Progress: Met  Visit Information  Last OT Received On:  01/15/13 Assistance Needed: +1          Cognition  Cognition Arousal/Alertness: Awake/alert Behavior During Therapy: WFL for tasks assessed/performed Overall Cognitive Status: Within Functional Limits for tasks assessed    Mobility  Bed Mobility Details for Bed Mobility Assistance: Pt up in chair upon arrival Transfers Transfers: Sit to Stand;Stand to Sit Sit to Stand: 5: Supervision;With upper extremity assist;With armrests;From chair/3-in-1 Stand to Sit: 5: Supervision;With upper extremity assist;With armrests;To chair/3-in-1          End of Session OT - End of Session Equipment Utilized During Treatment:  (RW) Activity Tolerance: Patient tolerated treatment well Patient left: in chair;with call bell/phone within reach       Evette Georges  [phone]  01/15/2013, 10:21 AM

## 2013-01-15 NOTE — Progress Notes (Signed)
PULMONARY  / CRITICAL CARE MEDICINE  Name: [name] MRN: [id number] DOB: 1935-04-26    ADMISSION DATE:  01/11/2013 CONSULTATION DATE:  01/11/2013  REFERRING MD :  EDP PRIMARY SERVICE:  PCCM  CHIEF COMPLAINT:  Hypoxemic respiratory failure  BRIEF PATIENT DESCRIPTION: 78 yo with past medical history of OSA (non-compliant with CPAP), Negative sleep study 5-13,chronic hypoxemia (chronically 3L [place] with baseline saturation 88-90%), diastolic heart failure and pulmonary hypertension (PAP 78 torr by TTE) brought to [facility] ED with complaints of acute dyspnea..  In ED found to be hypoxic requiring NIMV/BiPAP.  PCCM was consulted.  LINES / TUBES:  CULTURES: 5-19 bcx2>> 5-19 uc >> multiple morphologies  ANTIBIOTICS:  SIGNIFICANT EVENTS / STUDIES:  03/10/2012  PFT >>> No obstructive / restrictive disease, moderately decreased DLCO but normal when corrected to lung volume 10/14/2012  TTE >>> Diastolic dysfunction, PAP 78 torr 10/16/12 - VQ - Low PROB for PE .................. 01/12/2013: VQ scan low probability for pulmonary embolism. Doppler negative for DVT -> heparin stopped 01/12/2013: Echo with severe right ventricle dilation but normal left ventricle ejection fraction   INTERVAL HISTORY: Not wearing CPAP    VITAL SIGNS: Temp:  [97.4 F (36.3 C)-98.8 F (37.1 C)] 97.4 F (36.3 C) (05/23 1013) Pulse Rate:  [99-107] 107 (05/23 1013) Resp:  [18-20] 18 (05/23 1013) BP: (112-122)/(59-77) 122/72 mmHg (05/23 1013) SpO2:  [93 %-94 %] 94 % (05/23 1013) Weight:  [101.197 kg (223 lb 1.6 oz)] 101.197 kg (223 lb 1.6 oz) (05/23 0450)  HEMODYNAMICS:   VENTILATOR SETTINGS:    INTAKE / OUTPUT: Intake/Output     05/22 0701 - 05/23 0700 05/23 0701 - 05/24 0700   P.O. 1140 220   I.V. (mL/kg)     Total Intake(mL/kg) 1140 (11.3) 220 (2.2)   Urine (mL/kg/hr) 5250 (2.2) 500 (1)   Stool     Total Output 5250 500   Net -4110 -280        Stool Occurrence 2 x      PHYSICAL  EXAMINATION: General:  Obese,  no distress looks chronically ill. Neuro:  Awake, alert, nonfocal.   HEENT:  PERRL,  Cardiovascular:  RRR, no m/r/g Lungs:  Bilateral diminished air entry, few rales, no w/r Abdomen:  Soft, nontender, bowel sounds diminished Musculoskeletal:  Moves all extremities, bilateral pitting LE edema Skin:  Intact    Imaging x48 hours No results found.  ASSESSMENT / PLAN:  PULMONARY  Recent Labs Lab 01/11/13 2115 01/12/13 0923  PHART 7.335* 7.326*  PCO2ART 38.7 43.1  PO2ART 73.0* 49.0*  HCO3 20.6 22.5  TCO2 22 24  O2SAT 94.0 81.0    A:  Severe pulmonary hypertension (PAP 78 torr by TTE) with R sided congestive heart failure.  OHS, not on NIPPV. PSG from 6/'13 > no OSA.  Chronic hypoxia (baseline SpO2 88-90 on 3L ).  Acute on chronic hypoxemic renal failure. No pulmonary embolism by V/Q.  P:   -  Supplemental oxygen - No indication for bronchodilators / systemic steroids - consider repeat PSG, ? Central apneas or benefit from BiPAP. Will need to discuss w Dr Craige Cotta as an outpt  CARDIOVASCULAR  Recent Labs Lab 01/08/13 1510 01/11/13 2040 01/12/13 0025  PROBNP 2632.0* 5411.0* 4591.0*      Recent Labs Lab 01/12/13 0025 01/12/13 0520 01/12/13 1315  TROPONINI <0.30 <0.30 <0.30    A: Diastolic heart failure.  negative 7.2L overall for hospitalization Hypertension  P:  Goal MAP>55 Stop IV lasix Restart home Coreg, bumex  5/23 If BP will tolerate on 5/24 then restart her home diltiazem On Losartan and tolerating Continue ASA, Zetia, Pravachol  RENAL  Recent Labs Lab 01/11/13 2040 01/12/13 0025 01/13/13 0415 01/14/13 0505 01/15/13 0450  NA 135 133* 135 138 138  K 5.5* 5.3* 4.0 4.1 4.0  CL 97 98 100 101 99  CO2 21 19 22 24 25   GLUCOSE 227* 196* 124* 119* 116*  BUN 27* 28* 29* 21 16  CREATININE 1.90* 1.76* 1.20* 0.88 0.80  CALCIUM 9.8 9.2 8.8 9.7 9.8  MG  --   --  1.7 1.9 1.9  PHOS  --   --  4.4 3.3 3.4   Intake/Output      05/22 0701 - 05/23 0700 05/23 0701 - 05/24 0700   P.O. 1140 220   I.V. (mL/kg)     Total Intake(mL/kg) 1140 (11.3) 220 (2.2)   Urine (mL/kg/hr) 5250 (2.2) 500 (1)   Stool     Total Output 5250 500   Net -4110 -280        Stool Occurrence 2 x      A:  Acute on chronic renal failure.  Mild hyperkalemia. Lab Results  Component Value Date   CREATININE 0.80 01/15/2013   CREATININE 0.88 01/14/2013   CREATININE 1.20* 01/13/2013    Recent Labs Lab 01/13/13 0415 01/14/13 0505 01/15/13 0450  K 4.0 4.1 4.0   P:   Trend BMP Holding Preadmission Bumex  Continue lasix as ordered and follow UOP, S Cr Hold K supplements   GASTROINTESTINAL  Recent Labs Lab 01/11/13 2040  AST 26  ALT 15  ALKPHOS 138*  BILITOT 0.6  PROT 6.5  ALBUMIN 3.8    A:  No active issues. P:   Heart healthy diet  Protonix as on Prevacid preadmission  HEMATOLOGIC  Recent Labs Lab 01/13/13 0415 01/14/13 0505 01/15/13 0450  HGB 13.7 14.0 14.1  HCT 41.4 42.6 43.7  WBC 5.6 6.3 5.4  PLT 121* 126* 120*   No results found for this basename: INR,  in the last 168 hours  A:  Mild low platelet ? Due to heparin  P:  Heparin restarted 5/21   INFECTIOUS  A:  No evidence of acute infection. P:   Monitor off antibiotics   ENDOCRINE  CBG (last 3)   Recent Labs  01/15/13 0415 01/15/13 0722 01/15/13 1038  GLUCAP 119* 123* 202*     A:  Morbid obesity.  DM.  Hyperglycemia.  Hypothyroidism.   Recent Labs  01/15/13 0415 01/15/13 0722 01/15/13 1038  GLUCAP 119* 123* 202*   tsh 1.2   P:   SSI Restart Linagliptin, Glipizide on 4/23 Synthroid  NEUROLOGIC A:  No active issues. P:   Low dose xanax  GLOBAL:  ? Home on 5/24 if she tolerates re-start of her Po medications  Levy Pupa, MD, PhD 01/15/2013, 12:11 PM [facility] [phone] or if no answer [phone]

## 2013-01-15 NOTE — Progress Notes (Signed)
The patient asking for medication for anxiety.   Tolerated Xanax yesterday; repeat one dose tonight.

## 2013-01-16 ENCOUNTER — Encounter: Payer: Self-pay | Admitting: Internal Medicine

## 2013-01-16 LAB — BASIC METABOLIC PANEL
CO2: 31 mEq/L (ref 19–32)
Calcium: 9.5 mg/dL (ref 8.4–10.5)
Creatinine, Ser: 0.87 mg/dL (ref 0.50–1.10)
GFR calc Af Amer: 74 mL/min — ABNORMAL LOW (ref >90)

## 2013-01-16 LAB — GLUCOSE, CAPILLARY

## 2013-01-16 MED ORDER — POTASSIUM CHLORIDE 20 MEQ PO PACK: 40.0000 meq | PACK | Freq: Once | ORAL | Status: DC

## 2013-01-16 MED ORDER — POTASSIUM CHLORIDE 20 MEQ/15ML (10%) PO LIQD
40.0000 meq | Freq: Once | ORAL | Status: AC
  Administered 2013-01-16: 40 meq via ORAL
  Filled 2013-01-16: qty 30

## 2013-01-16 NOTE — Discharge Summary (Signed)
Physician Discharge Summary  Patient ID: [name] MRN: [id number] DOB/AGE: Mar 21, 1935 78 y.o.  Admit date: 01/11/2013 Discharge date: 01/16/2013  Problem List Active Problems:   Acute respiratory failure  HPI: 78 yo with past medical history of OSA (non-compliant with CPAP), chronic hypoxemia (chronically 3L [place] with baseline saturation 88-90%), diastolic heart failure and pulmonary hypertension (PAP 78 torr by TTE) brought to [facility] ED with complaints of acute dyspnea.. In ED found to be hypoxic requiring NIMV/BiPAP. PCCM was consulted. Note she does not have OSA by sleep study 5-13.  Hospital Course: CULTURES:  5-19 bcx2>>  5-19 uc >> multiple morphologies  ANTIBIOTICS:  SIGNIFICANT EVENTS / STUDIES:  03/10/2012 PFT >>> No obstructive / restrictive disease, moderately decreased DLCO but normal when corrected to lung volume  10/14/2012 TTE >>> Diastolic dysfunction, PAP 78 torr  10/16/12 - VQ - Low PROB for PE  ..................  01/12/2013: VQ scan low probability for pulmonary embolism. Doppler negative for DVT -> heparin stopped  01/12/2013: Echo with severe right ventricle dilation but normal left ventricle ejection fraction   ASSESSMENT / PLAN:  PULMONARY   Recent Labs  Lab  01/11/13 2115  01/12/13 0923   PHART  7.335*  7.326*   PCO2ART  38.7  43.1   PO2ART  73.0*  49.0*   HCO3  20.6  22.5   TCO2  22  24   O2SAT  94.0  81.0    A: Severe pulmonary hypertension (PAP 78 torr by TTE) with R sided congestive heart failure. The pt does not have hypercarbia of significance, therefore this is not OHS. PSG from 6/'13 > no OSA. Chronic hypoxia (baseline SpO2 88-90 on 3L [place]). No pulmonary embolism by V/Q.  P:  - The pt has remained stable and is diuresing well. This is all probably due to biventricular failure. Is stable to go home today  CARDIOVASCULAR   Recent Labs  Lab  01/11/13 2040  01/12/13 0025   PROBNP  5411.0*  4591.0*     Recent Labs  Lab  01/12/13 0025   01/12/13 0520  01/12/13 1315   TROPONINI  <0.30  <0.30  <0.30    A: Diastolic heart failure. negative 7.2L overall for hospitalization  Hypertension  P:  Goal MAP>55  Stop IV lasix  Restart home Coreg, bumex 5/23  Ok to restart home diltiazem at d/c  On Losartan and tolerating  Continue ASA, Zetia, Pravachol  A: Morbid obesity. DM. Hyperglycemia. Hypothyroidism.   Recent Labs   01/15/13 1647  01/15/13 1946  01/16/13 0552   GLUCAP  137*  116*  99    tsh 1.2  P:  SSI  Restart Linagliptin, Glipizide on 4/23  Synthroid  Pt is stable for d/c home on current diuretic regimen. BP stable, so will add back her home cardiazem. Will need to see our NP in office next week(instructions given), with a bmet to f/u on renal function and potassium. Would also d/c her home on KCL qday. Needs apptm with Dr. Craige Cotta about 6-8 weeks.       Labs at discharge Lab Results  Component Value Date   CREATININE 0.87 01/16/2013   BUN 15 01/16/2013   NA 139 01/16/2013   K 3.4* 01/16/2013   CL 99 01/16/2013   CO2 31 01/16/2013   Lab Results  Component Value Date   WBC 5.4 01/15/2013   HGB 14.1 01/15/2013   HCT 43.7 01/15/2013   MCV 89.2 01/15/2013   PLT 120* 01/15/2013  Lab Results  Component Value Date   ALT 15 01/11/2013   AST 26 01/11/2013   ALKPHOS 138* 01/11/2013   BILITOT 0.6 01/11/2013   Lab Results  Component Value Date   INR 1.01 06/23/2009    Current radiology studies No results found.  Disposition:  01-Home or Self Care  Discharge Orders   Future Appointments Provider Department Dept Phone   01/19/2013 11:45 AM Chrystie Nose, MD [facility] [phone]   02/22/2013 1:45 PM Coralyn Helling, MD [facility] [phone]   Future Orders Complete By Expires     Discharge patient  As directed         Medication List    STOP taking these medications       nitrofurantoin (macrocrystal-monohydrate) 100 MG capsule  Commonly known  as:  MACROBID     omega-3 acid ethyl esters 1 G capsule  Commonly known as:  LOVAZA      TAKE these medications       ALPRAZolam 0.5 MG tablet  Commonly known as:  XANAX  Take 0.5 mg by mouth 4 (four) times daily as needed for anxiety.     aspirin 81 MG EC tablet  Take 81 mg by mouth daily.     brimonidine 0.1 % Soln  Commonly known as:  ALPHAGAN P  Place 1 drop into both eyes daily.     bumetanide 2 MG tablet  Commonly known as:  BUMEX  Take 0.5 tablets (1 mg total) by mouth 2 (two) times daily.     carvedilol 3.125 MG tablet  Commonly known as:  COREG  Take 2 tablets (6.25 mg total) by mouth 2 (two) times daily with a meal.     diltiazem 180 MG 24 hr capsule  Commonly known as:  CARDIZEM CD  Take 180 mg by mouth daily.     ezetimibe 10 MG tablet  Commonly known as:  ZETIA  Take 10 mg by mouth at bedtime.     glipiZIDE 10 MG tablet  Commonly known as:  GLUCOTROL  Take 10 mg by mouth 2 (two) times daily before a meal.     lansoprazole 30 MG capsule  Commonly known as:  PREVACID  Take 30 mg by mouth daily.     levothyroxine 150 MCG tablet  Commonly known as:  SYNTHROID, LEVOTHROID  Take 150 mcg by mouth daily.     linagliptin 5 MG Tabs tablet  Commonly known as:  TRADJENTA  Take 5 mg by mouth daily.     losartan 25 MG tablet  Commonly known as:  COZAAR  Take 1 tablet (25 mg total) by mouth 2 (two) times daily.     oxyCODONE-acetaminophen 5-325 MG per tablet  Commonly known as:  PERCOCET/ROXICET  Take 1 tablet by mouth every 4 (four) hours as needed for pain.     potassium chloride SA 20 MEQ tablet  Commonly known as:  K-DUR,KLOR-CON  Take 20 mEq by mouth 2 (two) times daily.     pravastatin 20 MG tablet  Commonly known as:  PRAVACHOL  Take 20 mg by mouth at bedtime.     Travoprost (BAK Free) 0.004 % Soln ophthalmic solution  Commonly known as:  TRAVATAN  Place 1 drop into both eyes at bedtime.           Follow-up Information   Follow up with  PARRETT,TAMMY, NP. (call and make appt for 7 days post discharge with bmet(blood work))  Contact information:   [address] [place] [address] [phone]       Follow up with Cassell Smiles., MD. (make an appointment for 1-2 weeks)    Contact information:   [address] [place] [address] [phone]       Follow up with Chrystie Nose, MD. (Make an appointment to see in near future.)    Contact information:   [address] [place] [address] [phone]        Discharged Condition: good  Time spent on discharge greater than 40 minutes.  Vital signs at Discharge. Temp:  [97.6 F (36.4 C)-98.1 F (36.7 C)] 97.9 F (36.6 C) (05/24 0517) Pulse Rate:  [91-104] 91 (05/24 0517) Resp:  [18] 18 (05/24 0517) BP: (118-127)/(70-81) 120/76 mmHg (05/24 0517) SpO2:  [90 %-94 %] 94 % (05/24 0517) Weight:  [99.383 kg (219 lb 1.6 oz)] 99.383 kg (219 lb 1.6 oz) (05/24 0517) Office follow up Special Information or instructions. She will need bmet on follow up with Rubye Oaks NP to evaluate renal function and electrolytes. Note she needs to follow up with DR. Sood 4-8 weeks. Signed: Brett Canales Minor ACNP Adolph Pollack PCCM Pager [phone] till 3 pm If no answer page [phone] 01/16/2013, 1:32 PM

## 2013-01-16 NOTE — Progress Notes (Signed)
PULMONARY  / CRITICAL CARE MEDICINE  Name: [name] MRN: [id number] DOB: 10-20-1934    ADMISSION DATE:  01/11/2013 CONSULTATION DATE:  01/11/2013  REFERRING MD :  EDP PRIMARY SERVICE:  PCCM  CHIEF COMPLAINT:  Hypoxemic respiratory failure  BRIEF PATIENT DESCRIPTION: 78 yo with past medical history of OSA (non-compliant with CPAP), Negative sleep study 5-13,chronic hypoxemia (chronically 3L [place] with baseline saturation 88-90%), diastolic heart failure and pulmonary hypertension (PAP 78 torr by TTE) brought to [facility] ED with complaints of acute dyspnea..  In ED found to be hypoxic requiring NIMV/BiPAP.  PCCM was consulted.  LINES / TUBES:  CULTURES: 5-19 bcx2>> 5-19 uc >> multiple morphologies  ANTIBIOTICS:  SIGNIFICANT EVENTS / STUDIES:  03/10/2012  PFT >>> No obstructive / restrictive disease, moderately decreased DLCO but normal when corrected to lung volume 10/14/2012  TTE >>> Diastolic dysfunction, PAP 78 torr 10/16/12 - VQ - Low PROB for PE .................. 01/12/2013: VQ scan low probability for pulmonary embolism. Doppler negative for DVT -> heparin stopped 01/12/2013: Echo with severe right ventricle dilation but normal left ventricle ejection fraction   INTERVAL HISTORY: Stable overnight with no increased wob.  Diuresing well.  No new complaints.    VITAL SIGNS: Temp:  [97.4 F (36.3 C)-98.1 F (36.7 C)] 97.9 F (36.6 C) (05/24 0517) Pulse Rate:  [91-107] 91 (05/24 0517) Resp:  [18] 18 (05/24 0517) BP: (118-127)/(70-81) 120/76 mmHg (05/24 0517) SpO2:  [90 %-94 %] 94 % (05/24 0517) Weight:  [99.383 kg (219 lb 1.6 oz)] 99.383 kg (219 lb 1.6 oz) (05/24 0517)  HEMODYNAMICS:   VENTILATOR SETTINGS:    INTAKE / OUTPUT: Intake/Output     05/23 0701 - 05/24 0700 05/24 0701 - 05/25 0700   P.O. 1160    Total Intake(mL/kg) 1160 (11.7)    Urine (mL/kg/hr) 3650 (1.5)    Total Output 3650     Net -2490            PHYSICAL EXAMINATION: General:  Obese,  no  distress looks Neuro:  Awake, alert, nonfocal.   HEENT:  No LN or TMG,  Cardiovascular:  RRR, no m/r/g Lungs: clear bs except bibasilar crackles Abdomen:  Soft, nontender, bowel sounds nl Musculoskeletal:  Moves all extremities, bilateral pitting LE edema Skin:  Intact    Imaging x48 hours No results found.  ASSESSMENT / PLAN:  PULMONARY  Recent Labs Lab 01/11/13 2115 01/12/13 0923  PHART 7.335* 7.326*  PCO2ART 38.7 43.1  PO2ART 73.0* 49.0*  HCO3 20.6 22.5  TCO2 22 24  O2SAT 94.0 81.0    A:  Severe pulmonary hypertension (PAP 78 torr by TTE) with R sided congestive heart failure.  The pt does not have hypercarbia of significance, therefore this is not OHS. PSG from 6/'13 > no OSA.  Chronic hypoxia (baseline SpO2 88-90 on 3L [place]).   No pulmonary embolism by V/Q.  P:   -  The pt has remained stable and is diuresing well.  This is all probably due to biventricular failure.  Is stable to go home today  CARDIOVASCULAR  Recent Labs Lab 01/11/13 2040 01/12/13 0025  PROBNP 5411.0* 4591.0*      Recent Labs Lab 01/12/13 0025 01/12/13 0520 01/12/13 1315  TROPONINI <0.30 <0.30 <0.30    A: Diastolic heart failure.  negative 7.2L overall for hospitalization Hypertension  P:  Goal MAP>55 Stop IV lasix Restart home Coreg, bumex 5/23 Ok to restart home diltiazem at d/c On Losartan and tolerating Continue ASA, Zetia, Pravachol  A:  Morbid obesity.  DM.  Hyperglycemia.  Hypothyroidism.   Recent Labs  01/15/13 1647 01/15/13 1946 01/16/13 0552  GLUCAP 137* 116* 99   tsh 1.2   P:   SSI Restart Linagliptin, Glipizide on 4/23 Synthroid  Pt is stable for d/c home on current diuretic regimen.  BP stable, so will add back her home cardiazem.  Will need to see our NP in office next week, with a bmet to f/u on renal function and potassium.  Would also d/c her home on KCL qday.  Needs apptm with Dr. Craige Cotta about 6-8 weeks.

## 2013-01-16 NOTE — Progress Notes (Signed)
[name] to be D/C'd Home per MD order.  Discussed with the patient and all questions fully answered.    Medication List    STOP taking these medications       nitrofurantoin (macrocrystal-monohydrate) 100 MG capsule  Commonly known as:  MACROBID     omega-3 acid ethyl esters 1 G capsule  Commonly known as:  LOVAZA      TAKE these medications       ALPRAZolam 0.5 MG tablet  Commonly known as:  XANAX  Take 0.5 mg by mouth 4 (four) times daily as needed for anxiety.     aspirin 81 MG EC tablet  Take 81 mg by mouth daily.     brimonidine 0.1 % Soln  Commonly known as:  ALPHAGAN P  Place 1 drop into both eyes daily.     bumetanide 2 MG tablet  Commonly known as:  BUMEX  Take 0.5 tablets (1 mg total) by mouth 2 (two) times daily.     carvedilol 3.125 MG tablet  Commonly known as:  COREG  Take 2 tablets (6.25 mg total) by mouth 2 (two) times daily with a meal.     diltiazem 180 MG 24 hr capsule  Commonly known as:  CARDIZEM CD  Take 180 mg by mouth daily.     ezetimibe 10 MG tablet  Commonly known as:  ZETIA  Take 10 mg by mouth at bedtime.     glipiZIDE 10 MG tablet  Commonly known as:  GLUCOTROL  Take 10 mg by mouth 2 (two) times daily before a meal.     lansoprazole 30 MG capsule  Commonly known as:  PREVACID  Take 30 mg by mouth daily.     levothyroxine 150 MCG tablet  Commonly known as:  SYNTHROID, LEVOTHROID  Take 150 mcg by mouth daily.     linagliptin 5 MG Tabs tablet  Commonly known as:  TRADJENTA  Take 5 mg by mouth daily.     losartan 25 MG tablet  Commonly known as:  COZAAR  Take 1 tablet (25 mg total) by mouth 2 (two) times daily.     oxyCODONE-acetaminophen 5-325 MG per tablet  Commonly known as:  PERCOCET/ROXICET  Take 1 tablet by mouth every 4 (four) hours as needed for pain.     potassium chloride SA 20 MEQ tablet  Commonly known as:  K-DUR,KLOR-CON  Take 20 mEq by mouth 2 (two) times daily.     pravastatin 20 MG tablet   Commonly known as:  PRAVACHOL  Take 20 mg by mouth at bedtime.     Travoprost (BAK Free) 0.004 % Soln ophthalmic solution  Commonly known as:  TRAVATAN  Place 1 drop into both eyes at bedtime.        VVS, Skin clean, dry and intact without evidence of skin break down, no evidence of skin tears noted. IV catheter discontinued intact. Site without signs and symptoms of complications. Dressing and pressure applied.  An After Visit Summary was printed and given to the patient. Patient escorted via WC, and D/C home via private auto.  [name] 01/16/2013 2:44 PM

## 2013-01-17 NOTE — Discharge Summary (Signed)
Pt seen and examined on d/c day.  Please see my full note.

## 2013-01-18 LAB — CULTURE, BLOOD (ROUTINE X 2): Culture: NO GROWTH

## 2013-01-19 ENCOUNTER — Encounter: Payer: Self-pay | Admitting: Internal Medicine

## 2013-01-19 ENCOUNTER — Ambulatory Visit (INDEPENDENT_AMBULATORY_CARE_PROVIDER_SITE_OTHER): Payer: Medicare Other | Admitting: Internal Medicine

## 2013-01-19 VITALS — BP 98/60 | Wt 220.0 lb

## 2013-01-19 DIAGNOSIS — I272 Pulmonary hypertension, unspecified: Secondary | ICD-10-CM

## 2013-01-19 DIAGNOSIS — I509 Heart failure, unspecified: Secondary | ICD-10-CM

## 2013-01-19 DIAGNOSIS — J9600 Acute respiratory failure, unspecified whether with hypoxia or hypercapnia: Secondary | ICD-10-CM

## 2013-01-19 DIAGNOSIS — E079 Disorder of thyroid, unspecified: Secondary | ICD-10-CM

## 2013-01-19 DIAGNOSIS — I5033 Acute on chronic diastolic (congestive) heart failure: Secondary | ICD-10-CM

## 2013-01-19 DIAGNOSIS — I2789 Other specified pulmonary heart diseases: Secondary | ICD-10-CM

## 2013-01-19 DIAGNOSIS — N179 Acute kidney failure, unspecified: Secondary | ICD-10-CM

## 2013-01-19 MED ORDER — DILTIAZEM HCL ER COATED BEADS 120 MG PO CP24: 120.0000 mg | ORAL_CAPSULE | Freq: Every day | ORAL | Status: DC

## 2013-01-19 NOTE — Patient Instructions (Signed)
Your physician recommends that you schedule a follow-up appointment in: 6 months  

## 2013-01-19 NOTE — Progress Notes (Signed)
OFFICE NOTE  Chief Complaint:  Hospital followup  Primary Care Physician: Cassell Smiles., MD  HPI:  [name] is a 78 year old female who has history of right heart failure, morbid obesity, pulmonary hypertension and obstructive sleep apnea intolerant to CPAP. She is on home oxygen, has been following her weight and BNP. In February 2014, she was admitted for acute-on-chronic exacerbation of right heart failure. BNP was over 5000. She was diuresed aggressively down to about 216 pounds and has remained fairly stable. Her weight has crept up slightly, now up to 221 pounds and she did note that she has had some worsening shortness of breath. Most of her shortness of breath is more noticeable when lying down and she started to have to cut her socks so that they do not leave sock lines.  After this I increased her Bumex to 3 mg twice daily in April, however she was then admitted with acute on chronic renal failure. We were not notified of this hospitalization. She then followed up with Dr. Royann Shivers in our office on 12/28/2012. He felt that she was euvolemic at the time as her BNP was down to 750 and she said no signs of volume overload. Her weight was stable at 216. Subsequent to that she was recently admitted again for exacerbation of right heart failure and pulmonary hypertension. She was not felt to have OHS, however she does have sleep apnea and is noncompliant. She was diuresed over 7 L, or BiPAP for a short period time, eventually improved. She returns today for followup visit and reports her breathing is at baseline. She is now on 3.5 L per minute oxygen nasal cannula.   PMHx:  Past Medical History  Diagnosis Date  . Diabetes mellitus   . Hypertension   . Shingles   . GERD (gastroesophageal reflux disease)   . Abnormal heart rhythms   . COPD (chronic obstructive pulmonary disease)     on 3 L continuous   . Thyroid disease     hypthyroidism  . Pulmonary hypertension, moderate to  severe     No evidence of CAD on Cath 2011  . OSA on CPAP     not compliant with cpap; study 01/29/2012 - shows AHI of 2.5  . Obesity, morbid (more than 100 lbs over ideal weight or BMI > 40)   . Chronic respiratory failure with hypoxia 01/23/2012  . Glaucoma   . CHF (congestive heart failure)     Echo at [facility] 10/14/2012 see notes  . Acute on chronic renal failure     Past Surgical History  Procedure Laterality Date  . Tubal ligation    . Ovary removed    . Knee arthroscopy      left  . Cardiovascular stress test  09/15/2006    EF 81%; normal perfusion all regions; LV normal in size; no scintigraphic evidence of inducible myocardial ischemia  . Cardiac catheterization  02/21/2010    EF 65%; PA pressure 86/21 with mean of 44; normal coronary arteries, primary pumonary hypertension; no significant wall motion abnormalities    FAMHx:  Family History  Problem Relation Age of Onset  . Heart attack Father 53  . Heart disease Mother 36  . Diabetes Mother   . Lymphoma Daughter   . Diabetes      siblings, son  . Coronary artery disease Brother   . Diabetes Brother   . Stroke Brother   . Diabetes Brother   . Diabetes Sister   . Hypertension  Sister   . Diabetes Sister   . Heart disease Sister   . Hypertension Child   . Sleep apnea Child   . Hypertension Child   . Hypertension Child   . Arthritis/Rheumatoid Child   . Hyperlipidemia Child   . Hypertension Child     SOCHx:   reports that she has never smoked. Her smokeless tobacco use includes Snuff. She reports that she does not drink alcohol or use illicit drugs.  ALLERGIES:  Allergies  Allergen Reactions  . Diphenhydramine Hcl Other (See Comments)    Glaucoma prevents patient from taking this medication   . Aloe Vera Rash  . Penicillins Swelling and Rash    ROS: A comprehensive review of systems was negative except for: Constitutional: positive for fatigue and Weight Cardiovascular: positive for Right heart failure,  edema  HOME MEDS: Current Outpatient Prescriptions  Medication Sig Dispense Refill  . ALPRAZolam (XANAX) 0.5 MG tablet Take 0.5 mg by mouth 4 (four) times daily as needed for anxiety.       Marland Kitchen aspirin 81 MG EC tablet Take 81 mg by mouth daily.      . brimonidine (ALPHAGAN P) 0.1 % SOLN Place 1 drop into both eyes daily.      . bumetanide (BUMEX) 2 MG tablet Take 0.5 tablets (1 mg total) by mouth 2 (two) times daily.  60 tablet  1  . carvedilol (COREG) 3.125 MG tablet Take 2 tablets (6.25 mg total) by mouth 2 (two) times daily with a meal.  60 tablet  1  . ezetimibe (ZETIA) 10 MG tablet Take 10 mg by mouth at bedtime.        Marland Kitchen glipiZIDE (GLUCOTROL) 10 MG tablet Take 10 mg by mouth 2 (two) times daily before a meal.        . lansoprazole (PREVACID) 30 MG capsule Take 30 mg by mouth daily.        Marland Kitchen levothyroxine (SYNTHROID, LEVOTHROID) 150 MCG tablet Take 150 mcg by mouth daily.      Marland Kitchen linagliptin (TRADJENTA) 5 MG TABS tablet Take 5 mg by mouth daily.      Marland Kitchen losartan (COZAAR) 25 MG tablet Take 1 tablet (25 mg total) by mouth 2 (two) times daily.  60 tablet  1  . metFORMIN (GLUCOPHAGE) 500 MG tablet Take 500 mg by mouth daily.      Marland Kitchen oxyCODONE-acetaminophen (PERCOCET/ROXICET) 5-325 MG per tablet Take 1 tablet by mouth every 4 (four) hours as needed for pain.      . potassium chloride SA (K-DUR,KLOR-CON) 20 MEQ tablet Take 20 mEq by mouth 2 (two) times daily.      . pravastatin (PRAVACHOL) 20 MG tablet Take 20 mg by mouth at bedtime.       . Travoprost, BAK Free, (TRAVATAN) 0.004 % SOLN ophthalmic solution Place 1 drop into both eyes at bedtime.      Marland Kitchen diltiazem (CARDIZEM CD) 120 MG 24 hr capsule Take 1 capsule (120 mg total) by mouth daily.  30 capsule  6   No current facility-administered medications for this visit.    LABS/IMAGING: No results found for this or any previous visit (from the past 48 hour(s)). No results found.  VITALS: BP 98/60  Wt 220 lb (99.791 kg)  BMI 41.59  kg/m2  EXAM: General appearance: alert and no distress Neck: no adenopathy, no carotid bruit, no JVD, supple, symmetrical, trachea midline and thyroid not enlarged, symmetric, no tenderness/mass/nodules Lungs: diminished breath sounds bilaterally, dullness to percussion bibasilar  and wheezes bilaterally Heart: regular rate and rhythm, S1, S2 normal, no murmur, click, rub or gallop Abdomen: Morbidly obese, soft, no fluid wave Extremities: edema 1-2+ edema Pulses: 2+ and symmetric Skin: Pale, warm dry Neurologic: Grossly normal  EKG: Not performed  ASSESSMENT: 1. Acute on chronic congestive right heart failure 2. Severe pulmonary hypertension 3. Obstructive sleep apnea, noncompliant 4. Morbid obesity  PLAN: 1.   [name] was recently hospitalized for acute on chronic right heart failure exacerbation. She was diureses and currently weighs 220 pounds. Her dry weight prior to this was 216 pounds, however this was at the expense of worsening kidney disease. She reports mild improvement in breathing but continues to get markedly short of breath with minimal exertion. I suspect a lot of this is due to severe pulmonary hypertension. She is now on oxygen 3.5 L per minute continuous. She is scheduled for followup with Dr. Craige Cotta in pulmonary.  Her medication list shows both torsemide and bumetanide. I will go head and discontinue torsemide and Kercher did continue bumetanide 1 mg twice a day. We will see her back in the office in 6 months. We appreciate you contacting [facility] for any future hospitalizations [name] may have concerning right heart failure.  Chrystie Nose, MD, [facility] Attending Cardiologist [facility]  Maryl Blalock C 01/19/2013, 5:30 PM

## 2013-01-21 ENCOUNTER — Telehealth: Payer: Self-pay | Admitting: Pulmonary Disease

## 2013-01-21 NOTE — Telephone Encounter (Signed)
Noted  

## 2013-01-21 NOTE — Telephone Encounter (Signed)
I spoke with Byrd Hesselbach. She was calling to give update on pt. She was d/c from hospital 01/17/13. Per Byrd Hesselbach pt has no increase SOB, no wheezing, no chest tx, no increase in coughing. Pt is doing better. Her weight is down. Pt has pending appt with VS on 02/22/13. I advised will forward as an FYI to VS.

## 2013-02-01 ENCOUNTER — Encounter: Payer: Self-pay | Admitting: Pulmonary Disease

## 2013-02-16 ENCOUNTER — Other Ambulatory Visit (HOSPITAL_COMMUNITY): Payer: Self-pay | Admitting: Internal Medicine

## 2013-02-22 ENCOUNTER — Ambulatory Visit (INDEPENDENT_AMBULATORY_CARE_PROVIDER_SITE_OTHER): Payer: Medicare Other | Admitting: Pulmonary Disease

## 2013-02-22 ENCOUNTER — Encounter: Payer: Self-pay | Admitting: Pulmonary Disease

## 2013-02-22 VITALS — BP 118/64 | HR 76 | Temp 97.2°F | Ht 61.0 in | Wt 223.2 lb

## 2013-02-22 DIAGNOSIS — J9611 Chronic respiratory failure with hypoxia: Secondary | ICD-10-CM

## 2013-02-22 DIAGNOSIS — J9610 Chronic respiratory failure, unspecified whether with hypoxia or hypercapnia: Secondary | ICD-10-CM

## 2013-02-22 DIAGNOSIS — I272 Pulmonary hypertension, unspecified: Secondary | ICD-10-CM

## 2013-02-22 DIAGNOSIS — R0902 Hypoxemia: Secondary | ICD-10-CM

## 2013-02-22 DIAGNOSIS — R0600 Dyspnea, unspecified: Secondary | ICD-10-CM

## 2013-02-22 DIAGNOSIS — I2789 Other specified pulmonary heart diseases: Secondary | ICD-10-CM

## 2013-02-22 DIAGNOSIS — R0609 Other forms of dyspnea: Secondary | ICD-10-CM

## 2013-02-22 NOTE — Assessment & Plan Note (Signed)
Related to OHS, morbid obesity, diastolic CHF, and deconditioning.

## 2013-02-22 NOTE — Assessment & Plan Note (Signed)
Related to OHS.  She has continued difficulty with her oxygen.  Will increase to 6 to 8 liters, and arrange for oximyzer.  Will arrange for ONO with 6 liters.

## 2013-02-22 NOTE — Progress Notes (Signed)
Chief Complaint  Patient presents with  . Follow-up    Pt states she has good and bad days with her breathing. Pt c/o slight cough w/ white phlem. denies any wheezing, no chest tx, no CP. Pt uses 3 liters 02 24/7. Pt entered exam room 78% 3 liters pulse. pt placed on 4 liters cont  was at 90% after sitting for a few minutes    CC: [name]  History of Present Illness: [name] is a 78 y.o. female with dyspnea, OHS, morbid obesity, diastolic CHF and deconditioning.  She was in hospital recently for respiratory failure from CHF.  She is doing better.  She still gets winded with minimal activity.  She uses 3 liters pulsed oxygen with exertion.  She denies chest pain, cough, or wheeze.  Her leg swelling is better.  Her SpO2 on 3 liters pulsed was in 70%'s today.  She dropped to mid 80%'s on 6 liters with exertion.  Tests: Echo 09/19/09>>mild LVH, EF 55%, diastolic dysfx, mild MR CXR 01/23/12>>minimal scar in lingula, otherwise clear lungs PSG 01/29/12>>AHI 2.5, PLMI 28.1. Needed 3 liters oxygen. PFT 03/10/12>>FEV1 1.65 (100%), FEV1% 78, TLC 3.96 (95%), DLCO 53, no BD 01/12/13 Echo >> PA 83 mmHg, mod LVH, EF 65 to 70% Doppler legs 01/12/13 >> no DVT V/Q scan 01/12/13 >> no PE  She  has a past medical history of Diabetes mellitus; Hypertension; Shingles; GERD (gastroesophageal reflux disease); Abnormal heart rhythms; Thyroid disease; Pulmonary hypertension, moderate to severe; OSA on CPAP; Obesity, morbid (more than 100 lbs over ideal weight or BMI > 40); Chronic respiratory failure with hypoxia (01/23/2012); Glaucoma; CHF (congestive heart failure); and Acute on chronic renal failure.  She  has past surgical history that includes Tubal ligation; ovary removed; Knee arthroscopy; Cardiovascular stress test (09/15/2006); and Cardiac catheterization (02/21/2010).   Outpatient Encounter Prescriptions as of 02/22/2013  Medication Sig Dispense Refill  . ALPRAZolam (XANAX) 0.5 MG tablet Take 0.5  mg by mouth 4 (four) times daily as needed for anxiety.       Marland Kitchen aspirin 81 MG EC tablet Take 81 mg by mouth daily.      . brimonidine (ALPHAGAN P) 0.1 % SOLN Place 1 drop into both eyes daily.      . bumetanide (BUMEX) 2 MG tablet Take 0.5 tablets (1 mg total) by mouth 2 (two) times daily.  60 tablet  1  . carvedilol (COREG) 3.125 MG tablet Take 2 tablets (6.25 mg total) by mouth 2 (two) times daily with a meal.  60 tablet  1  . diltiazem (CARDIZEM CD) 120 MG 24 hr capsule Take 1 capsule (120 mg total) by mouth daily.  30 capsule  6  . ezetimibe (ZETIA) 10 MG tablet Take 10 mg by mouth at bedtime.        Marland Kitchen glipiZIDE (GLUCOTROL) 10 MG tablet Take 10 mg by mouth 2 (two) times daily before a meal.        . lansoprazole (PREVACID) 30 MG capsule Take 30 mg by mouth daily.        Marland Kitchen levothyroxine (SYNTHROID, LEVOTHROID) 150 MCG tablet Take 150 mcg by mouth daily.      Marland Kitchen linagliptin (TRADJENTA) 5 MG TABS tablet Take 5 mg by mouth daily.      Marland Kitchen losartan (COZAAR) 25 MG tablet Take 1 tablet (25 mg total) by mouth 2 (two) times daily.  60 tablet  1  . metFORMIN (GLUCOPHAGE) 500 MG tablet Take 500 mg by mouth daily.      Marland Kitchen  oxyCODONE-acetaminophen (PERCOCET/ROXICET) 5-325 MG per tablet Take 1 tablet by mouth every 4 (four) hours as needed for pain.      . potassium chloride SA (K-DUR,KLOR-CON) 20 MEQ tablet Take 20 mEq by mouth 2 (two) times daily.      . pravastatin (PRAVACHOL) 20 MG tablet Take 20 mg by mouth at bedtime.       . Travoprost, BAK Free, (TRAVATAN) 0.004 % SOLN ophthalmic solution Place 1 drop into both eyes at bedtime.       No facility-administered encounter medications on file as of 02/22/2013.    Allergies  Allergen Reactions  . Diphenhydramine Hcl Other (See Comments)    Glaucoma prevents patient from taking this medication   . Aloe Vera Rash  . Penicillins Swelling and Rash    Physical Exam:  General - Using supplemental oxygen  HEENT - No sinus tenderness, MP 3, no oral  exudate, no LAN  Cardiac - s1s2 regular, no murmur  Chest - decreased breath sounds, no wheeze/rales  Abdomen - obese, soft, non tender  Extremities - 1+ ankle edema Neurologic - normal strength Skin - no rashes  Psychiatric - normal mood, behavior   Assessment/Plan:  Coralyn Helling, MD [facility] Pager:  [phone] 02/22/2013, 1:57 PM

## 2013-02-22 NOTE — Patient Instructions (Signed)
Will increase oxygen to 6 to 8 liters  Will arrange for overnight oxygen test with 6 liters oxygen  Follow up in 6 weeks

## 2013-02-22 NOTE — Assessment & Plan Note (Signed)
Will re-assess once her oxygen set up is stable.

## 2013-02-24 ENCOUNTER — Other Ambulatory Visit: Payer: Self-pay | Admitting: Internal Medicine

## 2013-03-08 ENCOUNTER — Emergency Department (HOSPITAL_COMMUNITY): Payer: Medicare Other

## 2013-03-08 ENCOUNTER — Encounter (HOSPITAL_COMMUNITY): Payer: Self-pay | Admitting: *Deleted

## 2013-03-08 ENCOUNTER — Inpatient Hospital Stay (HOSPITAL_COMMUNITY)
Admission: EM | Admit: 2013-03-08 | Discharge: 2013-03-17 | DRG: 291 | Disposition: A | Discharge status: Home / Self Care | Payer: Medicare Other | Attending: Internal Medicine | Admitting: Internal Medicine

## 2013-03-08 DIAGNOSIS — I5032 Chronic diastolic (congestive) heart failure: Secondary | ICD-10-CM | POA: Diagnosis present

## 2013-03-08 DIAGNOSIS — N289 Disorder of kidney and ureter, unspecified: Secondary | ICD-10-CM | POA: Diagnosis present

## 2013-03-08 DIAGNOSIS — N179 Acute kidney failure, unspecified: Secondary | ICD-10-CM | POA: Diagnosis not present

## 2013-03-08 DIAGNOSIS — I50810 Right heart failure, unspecified: Secondary | ICD-10-CM

## 2013-03-08 DIAGNOSIS — F411 Generalized anxiety disorder: Secondary | ICD-10-CM | POA: Diagnosis present

## 2013-03-08 DIAGNOSIS — D696 Thrombocytopenia, unspecified: Secondary | ICD-10-CM | POA: Diagnosis not present

## 2013-03-08 DIAGNOSIS — H409 Unspecified glaucoma: Secondary | ICD-10-CM | POA: Diagnosis present

## 2013-03-08 DIAGNOSIS — J9610 Chronic respiratory failure, unspecified whether with hypoxia or hypercapnia: Secondary | ICD-10-CM

## 2013-03-08 DIAGNOSIS — R0600 Dyspnea, unspecified: Secondary | ICD-10-CM

## 2013-03-08 DIAGNOSIS — G4733 Obstructive sleep apnea (adult) (pediatric): Secondary | ICD-10-CM | POA: Diagnosis present

## 2013-03-08 DIAGNOSIS — Z9851 Tubal ligation status: Secondary | ICD-10-CM

## 2013-03-08 DIAGNOSIS — Z888 Allergy status to other drugs, medicaments and biological substances status: Secondary | ICD-10-CM

## 2013-03-08 DIAGNOSIS — Z833 Family history of diabetes mellitus: Secondary | ICD-10-CM

## 2013-03-08 DIAGNOSIS — E785 Hyperlipidemia, unspecified: Secondary | ICD-10-CM | POA: Diagnosis present

## 2013-03-08 DIAGNOSIS — Z7982 Long term (current) use of aspirin: Secondary | ICD-10-CM

## 2013-03-08 DIAGNOSIS — K219 Gastro-esophageal reflux disease without esophagitis: Secondary | ICD-10-CM | POA: Diagnosis present

## 2013-03-08 DIAGNOSIS — I272 Pulmonary hypertension, unspecified: Secondary | ICD-10-CM | POA: Diagnosis present

## 2013-03-08 DIAGNOSIS — M549 Dorsalgia, unspecified: Secondary | ICD-10-CM | POA: Diagnosis present

## 2013-03-08 DIAGNOSIS — Z8261 Family history of arthritis: Secondary | ICD-10-CM

## 2013-03-08 DIAGNOSIS — I2789 Other specified pulmonary heart diseases: Secondary | ICD-10-CM | POA: Diagnosis present

## 2013-03-08 DIAGNOSIS — G8929 Other chronic pain: Secondary | ICD-10-CM | POA: Diagnosis present

## 2013-03-08 DIAGNOSIS — I129 Hypertensive chronic kidney disease with stage 1 through stage 4 chronic kidney disease, or unspecified chronic kidney disease: Secondary | ICD-10-CM | POA: Diagnosis present

## 2013-03-08 DIAGNOSIS — I5033 Acute on chronic diastolic (congestive) heart failure: Principal | ICD-10-CM | POA: Diagnosis present

## 2013-03-08 DIAGNOSIS — E875 Hyperkalemia: Secondary | ICD-10-CM | POA: Diagnosis not present

## 2013-03-08 DIAGNOSIS — R0902 Hypoxemia: Secondary | ICD-10-CM

## 2013-03-08 DIAGNOSIS — Z8249 Family history of ischemic heart disease and other diseases of the circulatory system: Secondary | ICD-10-CM

## 2013-03-08 DIAGNOSIS — J189 Pneumonia, unspecified organism: Secondary | ICD-10-CM | POA: Diagnosis present

## 2013-03-08 DIAGNOSIS — Z880 Allergy status to penicillin: Secondary | ICD-10-CM

## 2013-03-08 DIAGNOSIS — E119 Type 2 diabetes mellitus without complications: Secondary | ICD-10-CM | POA: Diagnosis present

## 2013-03-08 DIAGNOSIS — Z823 Family history of stroke: Secondary | ICD-10-CM

## 2013-03-08 DIAGNOSIS — E039 Hypothyroidism, unspecified: Secondary | ICD-10-CM | POA: Diagnosis present

## 2013-03-08 DIAGNOSIS — J9819 Other pulmonary collapse: Secondary | ICD-10-CM | POA: Diagnosis present

## 2013-03-08 DIAGNOSIS — Z79899 Other long term (current) drug therapy: Secondary | ICD-10-CM

## 2013-03-08 DIAGNOSIS — Z6841 Body Mass Index (BMI) 40.0 and over, adult: Secondary | ICD-10-CM

## 2013-03-08 DIAGNOSIS — F17200 Nicotine dependence, unspecified, uncomplicated: Secondary | ICD-10-CM | POA: Diagnosis present

## 2013-03-08 DIAGNOSIS — E079 Disorder of thyroid, unspecified: Secondary | ICD-10-CM

## 2013-03-08 DIAGNOSIS — I2729 Other secondary pulmonary hypertension: Secondary | ICD-10-CM | POA: Diagnosis present

## 2013-03-08 DIAGNOSIS — J9620 Acute and chronic respiratory failure, unspecified whether with hypoxia or hypercapnia: Secondary | ICD-10-CM | POA: Diagnosis present

## 2013-03-08 DIAGNOSIS — IMO0001 Reserved for inherently not codable concepts without codable children: Secondary | ICD-10-CM

## 2013-03-08 DIAGNOSIS — I509 Heart failure, unspecified: Secondary | ICD-10-CM | POA: Diagnosis present

## 2013-03-08 DIAGNOSIS — N189 Chronic kidney disease, unspecified: Secondary | ICD-10-CM | POA: Diagnosis present

## 2013-03-08 DIAGNOSIS — Z9981 Dependence on supplemental oxygen: Secondary | ICD-10-CM

## 2013-03-08 DIAGNOSIS — J9611 Chronic respiratory failure with hypoxia: Secondary | ICD-10-CM | POA: Diagnosis present

## 2013-03-08 DIAGNOSIS — E876 Hypokalemia: Secondary | ICD-10-CM | POA: Diagnosis not present

## 2013-03-08 DIAGNOSIS — E662 Morbid (severe) obesity with alveolar hypoventilation: Secondary | ICD-10-CM | POA: Diagnosis present

## 2013-03-08 DIAGNOSIS — Z807 Family history of other malignant neoplasms of lymphoid, hematopoietic and related tissues: Secondary | ICD-10-CM

## 2013-03-08 DIAGNOSIS — J9600 Acute respiratory failure, unspecified whether with hypoxia or hypercapnia: Secondary | ICD-10-CM

## 2013-03-08 DIAGNOSIS — I959 Hypotension, unspecified: Secondary | ICD-10-CM | POA: Diagnosis present

## 2013-03-08 LAB — COMPREHENSIVE METABOLIC PANEL
ALT: 10 U/L (ref 0–35)
AST: 19 U/L (ref 0–37)
CO2: 24 mEq/L (ref 19–32)
Calcium: 9.5 mg/dL (ref 8.4–10.5)
Chloride: 103 mEq/L (ref 96–112)
Creatinine, Ser: 1.37 mg/dL — ABNORMAL HIGH (ref 0.50–1.10)
GFR calc Af Amer: 43 mL/min — ABNORMAL LOW (ref >90)
GFR calc non Af Amer: 37 mL/min — ABNORMAL LOW (ref >90)
Glucose, Bld: 149 mg/dL — ABNORMAL HIGH (ref 70–99)
Sodium: 138 mEq/L (ref 135–145)
Total Bilirubin: 0.5 mg/dL (ref 0.3–1.2)

## 2013-03-08 LAB — POCT I-STAT 3, ART BLOOD GAS (G3+)
Acid-base deficit: 2 mmol/L (ref 0.0–2.0)
Bicarbonate: 24.1 mEq/L — ABNORMAL HIGH (ref 20.0–24.0)
Patient temperature: 98.6
TCO2: 25 mmol/L (ref 0–100)
pH, Arterial: 7.332 — ABNORMAL LOW (ref 7.350–7.450)

## 2013-03-08 LAB — CBC WITH DIFFERENTIAL/PLATELET
Eosinophils Relative: 2 % (ref 0–5)
HCT: 46.2 % — ABNORMAL HIGH (ref 36.0–46.0)
Lymphocytes Relative: 20 % (ref 12–46)
Lymphs Abs: 1.7 10*3/uL (ref 0.7–4.0)
MCV: 89.9 fL (ref 78.0–100.0)
Monocytes Absolute: 0.9 10*3/uL (ref 0.1–1.0)
Neutro Abs: 5.9 10*3/uL (ref 1.7–7.7)
RBC: 5.14 MIL/uL — ABNORMAL HIGH (ref 3.87–5.11)
WBC: 8.7 10*3/uL (ref 4.0–10.5)

## 2013-03-08 LAB — PRO B NATRIURETIC PEPTIDE: Pro B Natriuretic peptide (BNP): 4244 pg/mL — ABNORMAL HIGH (ref 0–450)

## 2013-03-08 MED ORDER — OXYCODONE-ACETAMINOPHEN 5-325 MG PO TABS
1.0000 | ORAL_TABLET | Freq: Two times a day (BID) | ORAL | Status: DC
  Administered 2013-03-09 – 2013-03-17 (×17): 1 via ORAL
  Filled 2013-03-08 (×18): qty 1

## 2013-03-08 MED ORDER — GLIPIZIDE 10 MG PO TABS
10.0000 mg | ORAL_TABLET | Freq: Two times a day (BID) | ORAL | Status: DC
  Administered 2013-03-09 – 2013-03-17 (×17): 10 mg via ORAL
  Filled 2013-03-08 (×20): qty 1

## 2013-03-08 MED ORDER — DILTIAZEM HCL ER COATED BEADS 120 MG PO CP24: 120.0000 mg | ORAL_CAPSULE | Freq: Every day | ORAL | Status: DC

## 2013-03-08 MED ORDER — ASPIRIN 81 MG PO CHEW
81.0000 mg | CHEWABLE_TABLET | Freq: Every day | ORAL | Status: DC
  Administered 2013-03-09 – 2013-03-17 (×9): 81 mg via ORAL
  Filled 2013-03-08 (×11): qty 1

## 2013-03-08 MED ORDER — LEVOTHYROXINE SODIUM 150 MCG PO TABS
150.0000 ug | ORAL_TABLET | Freq: Every day | ORAL | Status: DC
  Administered 2013-03-09 – 2013-03-17 (×9): 150 ug via ORAL
  Filled 2013-03-08 (×11): qty 1

## 2013-03-08 MED ORDER — LEVOFLOXACIN IN D5W 750 MG/150ML IV SOLN
750.0000 mg | INTRAVENOUS | Status: DC
  Administered 2013-03-08: 750 mg via INTRAVENOUS
  Filled 2013-03-08 (×2): qty 150

## 2013-03-08 MED ORDER — VANCOMYCIN HCL 10 G IV SOLR
1500.0000 mg | Freq: Once | INTRAVENOUS | Status: AC
  Administered 2013-03-08: 1500 mg via INTRAVENOUS
  Filled 2013-03-08: qty 1500

## 2013-03-08 MED ORDER — HEPARIN SODIUM (PORCINE) 5000 UNIT/ML IJ SOLN
5000.0000 [IU] | Freq: Three times a day (TID) | INTRAMUSCULAR | Status: DC
  Administered 2013-03-13: 5000 [IU] via SUBCUTANEOUS
  Filled 2013-03-08 (×28): qty 1

## 2013-03-08 MED ORDER — CARVEDILOL 6.25 MG PO TABS
6.2500 mg | ORAL_TABLET | Freq: Two times a day (BID) | ORAL | Status: DC
  Administered 2013-03-08 – 2013-03-17 (×13): 6.25 mg via ORAL
  Filled 2013-03-08 (×21): qty 1

## 2013-03-08 MED ORDER — LINAGLIPTIN 5 MG PO TABS
5.0000 mg | ORAL_TABLET | Freq: Every day | ORAL | Status: DC
  Administered 2013-03-09 – 2013-03-17 (×9): 5 mg via ORAL
  Filled 2013-03-08 (×9): qty 1

## 2013-03-08 MED ORDER — METOLAZONE 5 MG PO TABS
5.0000 mg | ORAL_TABLET | Freq: Two times a day (BID) | ORAL | Status: DC
  Administered 2013-03-08 – 2013-03-16 (×16): 5 mg via ORAL
  Filled 2013-03-08 (×18): qty 1

## 2013-03-08 MED ORDER — LOSARTAN POTASSIUM 25 MG PO TABS
25.0000 mg | ORAL_TABLET | Freq: Two times a day (BID) | ORAL | Status: DC
  Administered 2013-03-08 – 2013-03-15 (×9): 25 mg via ORAL
  Filled 2013-03-08 (×17): qty 1

## 2013-03-08 MED ORDER — EZETIMIBE 10 MG PO TABS
10.0000 mg | ORAL_TABLET | Freq: Every day | ORAL | Status: DC
  Administered 2013-03-08 – 2013-03-16 (×9): 10 mg via ORAL
  Filled 2013-03-08 (×11): qty 1

## 2013-03-08 MED ORDER — SIMVASTATIN 10 MG PO TABS
10.0000 mg | ORAL_TABLET | Freq: Every day | ORAL | Status: DC
  Administered 2013-03-08 – 2013-03-16 (×9): 10 mg via ORAL
  Filled 2013-03-08 (×10): qty 1

## 2013-03-08 MED ORDER — INSULIN ASPART 100 UNIT/ML ~~LOC~~ SOLN
0.0000 [IU] | Freq: Every day | SUBCUTANEOUS | Status: DC
  Administered 2013-03-11: 5 [IU] via SUBCUTANEOUS

## 2013-03-08 MED ORDER — ALPRAZOLAM 0.5 MG PO TABS
0.5000 mg | ORAL_TABLET | Freq: Two times a day (BID) | ORAL | Status: DC
  Administered 2013-03-09 – 2013-03-17 (×17): 0.5 mg via ORAL
  Filled 2013-03-08 (×17): qty 1

## 2013-03-08 MED ORDER — DEXTROSE 5 % IV SOLN
2.0000 g | INTRAVENOUS | Status: DC
  Administered 2013-03-08: 2 g via INTRAVENOUS
  Filled 2013-03-08: qty 2

## 2013-03-08 MED ORDER — INSULIN ASPART 100 UNIT/ML ~~LOC~~ SOLN
0.0000 [IU] | Freq: Three times a day (TID) | SUBCUTANEOUS | Status: DC
  Administered 2013-03-09: 2 [IU] via SUBCUTANEOUS
  Administered 2013-03-09 – 2013-03-10 (×2): 3 [IU] via SUBCUTANEOUS
  Administered 2013-03-10 – 2013-03-11 (×3): 2 [IU] via SUBCUTANEOUS
  Administered 2013-03-12 (×2): 3 [IU] via SUBCUTANEOUS
  Administered 2013-03-13: 5 [IU] via SUBCUTANEOUS
  Administered 2013-03-13: 3 [IU] via SUBCUTANEOUS
  Administered 2013-03-14 (×2): via SUBCUTANEOUS
  Administered 2013-03-15 – 2013-03-16 (×4): 3 [IU] via SUBCUTANEOUS
  Administered 2013-03-17: 2 [IU] via SUBCUTANEOUS

## 2013-03-08 MED ORDER — BRIMONIDINE TARTRATE 0.2 % OP SOLN
1.0000 [drp] | Freq: Every day | OPHTHALMIC | Status: DC
  Administered 2013-03-09 – 2013-03-17 (×9): 1 [drp] via OPHTHALMIC
  Filled 2013-03-08: qty 5

## 2013-03-08 MED ORDER — PANTOPRAZOLE SODIUM 40 MG PO TBEC
40.0000 mg | DELAYED_RELEASE_TABLET | Freq: Every day | ORAL | Status: DC
  Administered 2013-03-09 – 2013-03-17 (×9): 40 mg via ORAL
  Filled 2013-03-08 (×9): qty 1

## 2013-03-08 MED ORDER — BUMETANIDE 0.25 MG/ML IJ SOLN
2.0000 mg | Freq: Two times a day (BID) | INTRAMUSCULAR | Status: DC
  Administered 2013-03-09 – 2013-03-16 (×15): 2 mg via INTRAVENOUS
  Filled 2013-03-08 (×17): qty 8

## 2013-03-08 MED ORDER — TRAVOPROST (BAK FREE) 0.004 % OP SOLN
1.0000 [drp] | Freq: Every day | OPHTHALMIC | Status: DC
  Administered 2013-03-08 – 2013-03-16 (×9): 1 [drp] via OPHTHALMIC
  Filled 2013-03-08: qty 2.5

## 2013-03-08 MED ORDER — DILTIAZEM HCL ER COATED BEADS 120 MG PO CP24
120.0000 mg | ORAL_CAPSULE | Freq: Every day | ORAL | Status: DC
  Administered 2013-03-09 – 2013-03-17 (×6): 120 mg via ORAL
  Filled 2013-03-08 (×10): qty 1

## 2013-03-08 NOTE — Progress Notes (Addendum)
ANTIBIOTIC CONSULT NOTE - INITIAL  Pharmacy Consult for Cefepime -> Levaquin Indication: suspected PNA  Allergies  Allergen Reactions  . Darvocet (Propoxyphene-Acetaminophen) Nausea And Vomiting  . Diphenhydramine Hcl Other (See Comments)    Glaucoma prevents patient from taking this medication   . Aloe Vera Rash  . Penicillins Swelling and Rash    Patient Measurements:   Wt ~ 223 lb (100 kg)   Ht : 61 in  Vital Signs: Temp: 97.6 F (36.4 C) (07/14 1747) Temp src: Oral (07/14 1747) BP: 108/65 mmHg (07/14 1829) Pulse Rate: 75 (07/14 1829) Intake/Output from previous day:   Intake/Output from this shift:    Labs:  Recent Labs  03/08/13 1827  WBC 8.7  HGB 15.2*  PLT 139*  CREATININE 1.37*   The CrCl is unknown because both a height and weight (above a minimum accepted value) are required for this calculation. No results found for this basename: VANCOTROUGH, VANCOPEAK, VANCORANDOM, GENTTROUGH, GENTPEAK, GENTRANDOM, TOBRATROUGH, TOBRAPEAK, TOBRARND, AMIKACINPEAK, AMIKACINTROU, AMIKACIN,  in the last 72 hours   Microbiology: No results found for this or any previous visit (from the past 720 hour(s)).  Medical History: Past Medical History  Diagnosis Date  . Diabetes mellitus   . Hypertension   . Shingles   . GERD (gastroesophageal reflux disease)   . Abnormal heart rhythms   . Thyroid disease     hypthyroidism  . Pulmonary hypertension, moderate to severe     No evidence of CAD on Cath 2011  . OSA on CPAP     not compliant with cpap; study 01/29/2012 - shows AHI of 2.5  . Obesity, morbid (more than 100 lbs over ideal weight or BMI > 40)   . Chronic respiratory failure with hypoxia 01/23/2012    Related to OHS  . Glaucoma   . CHF (congestive heart failure)     Echo at [facility] 10/14/2012 see notes  . Acute on chronic renal failure     Medications:  See electronic med rec  Assessment: 78 year old female presents with edema and SOB. Pt was supposed to start taking  Bactrim today for ?lung infection. Noted pt with PCN allergy (swelling and rash) but has tolerated dose of IM ceftriaxone 12/2012. To begin cefepime for suspected PNA. CrCl ~40-45 ml/min.  Goal of Therapy:  Eradication of infection  Plan:  1. Cefepime 2 gm IV q24h. 2. Will f/u microbiological data, renal function, pt's clinical condition  Christoper Fabian, PharmD, BCPS Clinical pharmacist, pager [phone] 03/08/2013,8:09 PM  Addendum [phone]): Admitting MD changed from cefepime to levaquin.  Plan: 1. Levaquin 750mg  IV q48h. 2. Will f/u microbiological data, renal function, pt's clinical condition  Christoper Fabian, PharmD, BCPS Clinical pharmacist, pager [phone] 03/08/2013   10:43 PM

## 2013-03-08 NOTE — H&P (Signed)
PULMONARY  / CRITICAL CARE MEDICINE  Name: [name] MRN: [id number] DOB: Mar 15, 1935    ADMISSION DATE:  03/08/2013 CONSULTATION DATE:  03/08/2013  REFERRING MD :  EDP PRIMARY SERVICE:  PCCM  CHIEF COMPLAINT:  Dyspnea  BRIEF PATIENT DESCRIPTION: 78 yo with morbid obesity, OSA/OHS (not on CPAP/BiPAP), chronic hypoxemic respiratory failure on 6-8 L/minof oxygen, chronic diastolic CHF and pulmonary hypertension (Dr. Evlyn Courier office patient) presenting to [facility] ED with progressive dyspnea and symmetrical lower extremity edema.  Reports orthopnea and paroxysmal nocturnal dyspnea.  Reports weight gain form 219 lb baseline to 233 lb recently.  Also reports dry cough which is new.  Denies fever, chills, purulent sputum, hemoptysis.  There were no palpitations or chest pain.  SIGNIFICANT EVENTS / STUDIES:   LINES / TUBES:  CULTURES:  ANTIBIOTICS: Cefepime 7/14 x 1 Levofloxacin 7/15 >>>  HISTORY OF PRESENT ILLNESS:  78 yo with morbid obesity, OSA/OHS (not on CPAP/BiPAP), chronic hypoxemic respiratory failure on 6-8 L/minof oxygen, chronic diastolic CHF and pulmonary hypertension (Dr. Evlyn Courier office patient) presenting to [facility] ED with progressive dyspnea and symmetrical lower extremity edema.  Reports orthopnea and paroxysmal nocturnal dyspnea.  Reports weight gain form 219 lb baseline to 233 lb recently.  Also reports dry cough which is new.  Denies fever, chills, purulent sputum, hemoptysis.  PAST MEDICAL HISTORY :  Past Medical History  Diagnosis Date  . Diabetes mellitus   . Hypertension   . Shingles   . GERD (gastroesophageal reflux disease)   . Abnormal heart rhythms   . Thyroid disease     hypthyroidism  . Pulmonary hypertension, moderate to severe     No evidence of CAD on Cath 2011  . OSA on CPAP     not compliant with cpap; study 01/29/2012 - shows AHI of 2.5  . Obesity, morbid (more than 100 lbs over ideal weight or BMI > 40)   . Chronic respiratory failure with hypoxia 01/23/2012     Related to OHS  . Glaucoma   . CHF (congestive heart failure)     Echo at [facility] 10/14/2012 see notes  . Acute on chronic renal failure    Past Surgical History  Procedure Laterality Date  . Tubal ligation    . Ovary removed    . Knee arthroscopy      left  . Cardiovascular stress test  09/15/2006    EF 81%; normal perfusion all regions; LV normal in size; no scintigraphic evidence of inducible myocardial ischemia  . Cardiac catheterization  02/21/2010    EF 65%; PA pressure 86/21 with mean of 44; normal coronary arteries, primary pumonary hypertension; no significant wall motion abnormalities   Prior to Admission medications   Medication Sig Start Date End Date Taking? Authorizing Provider  ALPRAZolam Prudy Feeler) 0.5 MG tablet Take 0.5 mg by mouth 2 (two) times daily.    Yes Historical Provider, MD  aspirin 81 MG EC tablet Take 81 mg by mouth daily. 10/20/12  Yes Brittainy Simmons, PA-C  brimonidine (ALPHAGAN P) 0.1 % SOLN Place 1 drop into both eyes daily.   Yes Historical Provider, MD  bumetanide (BUMEX) 2 MG tablet Take 3 mg by mouth 2 (two) times daily. 12/28/12  Yes Annett Gula, MD  carvedilol (COREG) 3.125 MG tablet Take 2 tablets (6.25 mg total) by mouth 2 (two) times daily with a meal. 12/28/12  Yes Annett Gula, MD  diltiazem (CARDIZEM CD) 120 MG 24 hr capsule Take 120 mg by mouth daily. 01/19/13  Yes Chrystie Nose, MD  ezetimibe (ZETIA) 10 MG tablet Take 10 mg by mouth at bedtime.     Yes Historical Provider, MD  glipiZIDE (GLUCOTROL) 10 MG tablet Take 10 mg by mouth 2 (two) times daily before a meal.     Yes Historical Provider, MD  lansoprazole (PREVACID) 30 MG capsule Take 30 mg by mouth every morning.    Yes Historical Provider, MD  levothyroxine (SYNTHROID, LEVOTHROID) 150 MCG tablet Take 150 mcg by mouth daily before breakfast.    Yes Historical Provider, MD  linagliptin (TRADJENTA) 5 MG TABS tablet Take 5 mg by mouth daily.   Yes Historical Provider, MD  losartan (COZAAR)  25 MG tablet Take 1 tablet (25 mg total) by mouth 2 (two) times daily. 12/28/12  Yes Annett Gula, MD  oxyCODONE-acetaminophen (PERCOCET/ROXICET) 5-325 MG per tablet Take 1 tablet by mouth 2 (two) times daily. May take 1 additional dose if needed for pain   Yes Historical Provider, MD  potassium chloride SA (K-DUR,KLOR-CON) 20 MEQ tablet Take 20 mEq by mouth 2 (two) times daily. 10/20/12  Yes Brittainy Simmons, PA-C  pravastatin (PRAVACHOL) 20 MG tablet Take 20 mg by mouth at bedtime.    Yes Historical Provider, MD  sulfamethoxazole-trimethoprim (BACTRIM DS) 800-160 MG per tablet Take 1 tablet by mouth 2 (two) times daily. Take for 10 days. Scheduled to begin taking on 03/08/13. 03/08/13  Yes Historical Provider, MD  Travoprost, BAK Free, (TRAVATAN) 0.004 % SOLN ophthalmic solution Place 1 drop into both eyes at bedtime.   Yes Historical Provider, MD   Allergies  Allergen Reactions  . Darvocet (Propoxyphene-Acetaminophen) Nausea And Vomiting  . Diphenhydramine Hcl Other (See Comments)    Glaucoma prevents patient from taking this medication   . Aloe Vera Rash  . Penicillins Swelling and Rash   FAMILY HISTORY:  Family History  Problem Relation Age of Onset  . Heart attack Father 50  . Heart disease Mother 72  . Diabetes Mother   . Lymphoma Daughter   . Diabetes      siblings, son  . Coronary artery disease Brother   . Diabetes Brother   . Stroke Brother   . Diabetes Brother   . Diabetes Sister   . Hypertension Sister   . Diabetes Sister   . Heart disease Sister   . Hypertension Child   . Sleep apnea Child   . Hypertension Child   . Hypertension Child   . Arthritis/Rheumatoid Child   . Hyperlipidemia Child   . Hypertension Child    SOCIAL HISTORY:  reports that she has never smoked. Her smokeless tobacco use includes Snuff. She reports that she does not drink alcohol or use illicit drugs.  REVIEW OF SYSTEMS:   Constitutional: Negative for fever, chills, weight loss, and  diaphoresis. Positive for fatigue. HENT: Negative for hearing loss, ear pain, nosebleeds, congestion, sore throat, neck pain, tinnitus and ear discharge.   Eyes: Negative for blurred vision, double vision, photophobia, pain, discharge and redness.  Respiratory: Negative hemoptysis, sputum production, wheezing and stridor.  Positive for dyspnea, dry cough. Cardiovascular: Negative for chest pain, palpitations, claudication.  Positive for leg swelling, orthopnea and PND.  Gastrointestinal: Negative for heartburn, nausea, vomiting, abdominal pain, diarrhea, constipation, blood in stool and melena.  Genitourinary: Negative for dysuria, urgency, frequency, hematuria and flank pain.  Musculoskeletal: Negative for myalgias, joint pain and falls. Positive for chronic back pain. Skin: Negative for itching and rash.  Neurological: Negative for dizziness, tingling, tremors, sensory  change, speech change, focal weakness, seizures, loss of consciousness, weakness and headaches.  Endo/Heme/Allergies: Negative for environmental allergies and polydipsia. Does not bruise/bleed easily.  SUBJECTIVE:   VITAL SIGNS: Temp:  [97.6 F (36.4 C)] 97.6 F (36.4 C) (07/14 1747) Pulse Rate:  [75-76] 75 (07/14 1829) Resp:  [18-22] 18 (07/14 1829) BP: (108-115)/(59-65) 108/65 mmHg (07/14 1829) SpO2:  [85 %-86 %] 85 % (07/14 1829)  PHYSICAL EXAMINATION: General:  Morbidly obese, no acute distress Neuro:  Awake, alert, oriented, nonfocal HEENT:  PERRL Neck:  No overt JVD Cardiovascular:  Regular, no murmurs, distant heart sounds Lungs:  Bilateral air entry, diminished R lower lung field Abdomen:  Obese, soft Musculoskeletal:  Moves all extremities, 3+ symmetrical pitting pedal edema Skin:  Intact   Recent Labs Lab 03/08/13 1827  NA 138  K 5.2*  CL 103  CO2 24  BUN 31*  CREATININE 1.37*  GLUCOSE 149*    Recent Labs Lab 03/08/13 1827  HGB 15.2*  HCT 46.2*  WBC 8.7  PLT 139*   Dg Chest 2  View  03/08/2013   *RADIOLOGY REPORT*  Clinical Data: Shortness of breath  CHEST - 2 VIEW  Comparison: 01/11/2013  Findings: Bilateral pleural effusions, right greater than left. New right infrahilar consolidation / atelectasis.  Low lung volumes.  Mild cardiomegaly.  Mild central pulmonary vascular prominence.  Atheromatous aorta.  IMPRESSION:  1.  Cardiomegaly with bilateral pleural effusions and   right lower lobe atelectasis/consolidation.   Original Report Authenticated By: D. Andria Rhein, MD   ASSESSMENT / PLAN:  Acute exacerbation of chronic diastolic CHF Bilateral pleural effusions RLL atelectasis secondary to effusion, less likely CAP Morbid obesity/OSA/OHS Pulmonary hypertension Acute on chronic hypoxemic respiratory failure HTN DM Hypothyroidism Chronic renal failure Hyperkalemia GERD Chronic back pain Glaucoma  -->  Admit to SDU/cardiac monitorin/pulse oxymetry -->  Supplemental oxygen, goals SpO2>92  -->  Change Bumex to 2 mg IV q12h -->  Add Zaroxolyn 5 q12h -->  Empirical Levofloxacin but low threshold to d/c -->  PCT -->  Hold K supplements -->  Continue preadmission rx -->  SSI/CBG -->  Heparin [place] for DVT Px -->  Repeat labs in AM  Lonia Farber, MD Pulmonary and Critical Care Medicine [facility] Pager: [phone]  03/08/2013, 10:05 PM

## 2013-03-08 NOTE — ED Notes (Signed)
Pt is here with increased weight gain and shortness of breath.  Pt has cough and that is new for her.  Pt is usually on 6L and sats are usually between 80-90% but she has had to increase her oxygen to 7L

## 2013-03-08 NOTE — ED Provider Notes (Signed)
History    CSN: [id number] Arrival date & time 03/08/13  1725  First MD Initiated Contact with Patient 03/08/13 1804     Chief Complaint  Patient presents with  . shortness of breath   . Fluid retention    (Consider location/radiation/quality/duration/timing/severity/associated sxs/prior Treatment) HPI [name] 78 y.o. with multiple medical problems outlined below presents emergency department with worsening shortness of breath. She is on baseline oxygen at 6 L nasal cannula home. She had increase this to 7 L. She's been having worsening shortness of breath over the past 4-5 days. She reports normally been able to walk 20-30 feet prior to becoming short of breath. She now becomes short of breath with 3-4 steps. Shortness of breath is improved with rest. Also worsened with lying flat. She's also had increasing lower extremity swelling bilaterally. She denies chest pain, cough, fever, chills, lower extremity pain. Past Medical History  Diagnosis Date  . Diabetes mellitus   . Hypertension   . Shingles   . GERD (gastroesophageal reflux disease)   . Abnormal heart rhythms   . Thyroid disease     hypthyroidism  . Pulmonary hypertension, moderate to severe     No evidence of CAD on Cath 2011  . OSA on CPAP     not compliant with cpap; study 01/29/2012 - shows AHI of 2.5  . Obesity, morbid (more than 100 lbs over ideal weight or BMI > 40)   . Chronic respiratory failure with hypoxia 01/23/2012    Related to OHS  . Glaucoma   . CHF (congestive heart failure)     Echo at [facility] 10/14/2012 see notes  . Acute on chronic renal failure    Past Surgical History  Procedure Laterality Date  . Tubal ligation    . Ovary removed    . Knee arthroscopy      left  . Cardiovascular stress test  09/15/2006    EF 81%; normal perfusion all regions; LV normal in size; no scintigraphic evidence of inducible myocardial ischemia  . Cardiac catheterization  02/21/2010    EF 65%; PA pressure 86/21 with  mean of 44; normal coronary arteries, primary pumonary hypertension; no significant wall motion abnormalities   Family History  Problem Relation Age of Onset  . Heart attack Father 51  . Heart disease Mother 16  . Diabetes Mother   . Lymphoma Daughter   . Diabetes      siblings, son  . Coronary artery disease Brother   . Diabetes Brother   . Stroke Brother   . Diabetes Brother   . Diabetes Sister   . Hypertension Sister   . Diabetes Sister   . Heart disease Sister   . Hypertension Child   . Sleep apnea Child   . Hypertension Child   . Hypertension Child   . Arthritis/Rheumatoid Child   . Hyperlipidemia Child   . Hypertension Child    History  Substance Use Topics  . Smoking status: Never Smoker   . Smokeless tobacco: Current User    Types: Snuff  . Alcohol Use: No   OB History   Grav Para Term Preterm Abortions TAB SAB Ect Mult Living                 Review of Systems  Constitutional: Negative for fever, chills, diaphoresis, activity change and appetite change.  HENT: Negative for sore throat, rhinorrhea, sneezing, drooling and trouble swallowing.   Eyes: Negative for discharge and redness.  Respiratory: Positive  for shortness of breath. Negative for cough, chest tightness, wheezing and stridor.   Cardiovascular: Positive for leg swelling. Negative for chest pain.  Gastrointestinal: Negative for nausea, vomiting, abdominal pain, diarrhea, constipation and blood in stool.  Genitourinary: Negative for difficulty urinating.  Musculoskeletal: Negative for myalgias and arthralgias.  Skin: Negative for pallor.  Neurological: Negative for dizziness, syncope, speech difficulty, weakness, light-headedness and headaches.  Hematological: Negative for adenopathy. Does not bruise/bleed easily.  Psychiatric/Behavioral: Negative for confusion and agitation.    Allergies  Darvocet; Diphenhydramine hcl; Aloe vera; and Penicillins  Home Medications   No current outpatient  prescriptions on file. BP 105/60  Pulse 84  Temp(Src) 97.4 F (36.3 C) (Oral)  Resp 18  Ht 5\' 1"  (1.549 m)  Wt 236 lb 15.9 oz (107.5 kg)  BMI 44.8 kg/m2  SpO2 82% Physical Exam  Constitutional: She is oriented to person, place, and time. She appears well-developed and well-nourished. No distress.  Morbidly obese  HENT:  Head: Normocephalic and atraumatic.  Right Ear: External ear normal.  Left Ear: External ear normal.  Eyes: Conjunctivae and EOM are normal. Right eye exhibits no discharge. Left eye exhibits no discharge.  Neck: Normal range of motion. Neck supple. No JVD present.  Cardiovascular: Normal rate, regular rhythm and normal heart sounds.  Exam reveals no gallop and no friction rub.   No murmur heard. Pulmonary/Chest: No accessory muscle usage or stridor. Tachypnea (Respiratory rate 22) noted. Not bradypneic. No respiratory distress. She has no decreased breath sounds. She has no wheezes. She has rhonchi in the right lower field and the left lower field. She has no rales. She exhibits no tenderness.  Abdominal: Soft. Bowel sounds are normal. She exhibits no distension. There is no tenderness. There is no rebound and no guarding.  Musculoskeletal: Normal range of motion. She exhibits edema (3+ pretibial bilaterally).  Homans negative bilaterally  Neurological: She is alert and oriented to person, place, and time.  Skin: Skin is warm. No rash noted. She is not diaphoretic.  Psychiatric: She has a normal mood and affect. Her behavior is normal.    ED Course  Procedures (including critical care time) Labs Reviewed  PRO B NATRIURETIC PEPTIDE - Abnormal; Notable for the following:    Pro B Natriuretic peptide (BNP) 4244.0 (*)    All other components within normal limits  CBC WITH DIFFERENTIAL - Abnormal; Notable for the following:    RBC 5.14 (*)    Hemoglobin 15.2 (*)    HCT 46.2 (*)    Platelets 139 (*)    All other components within normal limits  COMPREHENSIVE  METABOLIC PANEL - Abnormal; Notable for the following:    Potassium 5.2 (*)    Glucose, Bld 149 (*)    BUN 31 (*)    Creatinine, Ser 1.37 (*)    Alkaline Phosphatase 122 (*)    GFR calc non Af Amer 37 (*)    GFR calc Af Amer 43 (*)    All other components within normal limits  POCT I-STAT 3, BLOOD GAS (G3+) - Abnormal; Notable for the following:    pH, Arterial 7.332 (*)    pCO2 arterial 45.5 (*)    pO2, Arterial 53.0 (*)    Bicarbonate 24.1 (*)    All other components within normal limits  MRSA PCR SCREENING  CBC  BASIC METABOLIC PANEL  MAGNESIUM  PHOSPHORUS  PROCALCITONIN  PROCALCITONIN  POCT I-STAT TROPONIN I   Results for orders placed during the hospital encounter of  03/08/13  PRO B NATRIURETIC PEPTIDE      Result Value Range   Pro B Natriuretic peptide (BNP) 4244.0 (*) 0 - 450 pg/mL  CBC WITH DIFFERENTIAL      Result Value Range   WBC 8.7  4.0 - 10.5 K/uL   RBC 5.14 (*) 3.87 - 5.11 MIL/uL   Hemoglobin 15.2 (*) 12.0 - 15.0 g/dL   HCT 09.8 (*) 11.9 - 14.7 %   MCV 89.9  78.0 - 100.0 fL   MCH 29.6  26.0 - 34.0 pg   MCHC 32.9  30.0 - 36.0 g/dL   RDW 82.9  56.2 - 13.0 %   Platelets 139 (*) 150 - 400 K/uL   Neutrophils Relative % 67  43 - 77 %   Neutro Abs 5.9  1.7 - 7.7 K/uL   Lymphocytes Relative 20  12 - 46 %   Lymphs Abs 1.7  0.7 - 4.0 K/uL   Monocytes Relative 10  3 - 12 %   Monocytes Absolute 0.9  0.1 - 1.0 K/uL   Eosinophils Relative 2  0 - 5 %   Eosinophils Absolute 0.2  0.0 - 0.7 K/uL   Basophils Relative 1  0 - 1 %   Basophils Absolute 0.1  0.0 - 0.1 K/uL  COMPREHENSIVE METABOLIC PANEL      Result Value Range   Sodium 138  135 - 145 mEq/L   Potassium 5.2 (*) 3.5 - 5.1 mEq/L   Chloride 103  96 - 112 mEq/L   CO2 24  19 - 32 mEq/L   Glucose, Bld 149 (*) 70 - 99 mg/dL   BUN 31 (*) 6 - 23 mg/dL   Creatinine, Ser 8.65 (*) 0.50 - 1.10 mg/dL   Calcium 9.5  8.4 - 78.4 mg/dL   Total Protein 6.2  6.0 - 8.3 g/dL   Albumin 3.5  3.5 - 5.2 g/dL   AST 19  0 - 37  U/L   ALT 10  0 - 35 U/L   Alkaline Phosphatase 122 (*) 39 - 117 U/L   Total Bilirubin 0.5  0.3 - 1.2 mg/dL   GFR calc non Af Amer 37 (*) >90 mL/min   GFR calc Af Amer 43 (*) >90 mL/min  POCT I-STAT TROPONIN I      Result Value Range   Troponin i, poc 0.02  0.00 - 0.08 ng/mL   Comment 3           POCT I-STAT 3, BLOOD GAS (G3+)      Result Value Range   pH, Arterial 7.332 (*) 7.350 - 7.450   pCO2 arterial 45.5 (*) 35.0 - 45.0 mmHg   pO2, Arterial 53.0 (*) 80.0 - 100.0 mmHg   Bicarbonate 24.1 (*) 20.0 - 24.0 mEq/L   TCO2 25  0 - 100 mmol/L   O2 Saturation 84.0     Acid-base deficit 2.0  0.0 - 2.0 mmol/L   Patient temperature 98.6 F     Collection site BRACHIAL ARTERY     Drawn by Operator     Sample type ARTERIAL      Dg Chest 2 View  03/08/2013   *RADIOLOGY REPORT*  Clinical Data: Shortness of breath  CHEST - 2 VIEW  Comparison: 01/11/2013  Findings: Bilateral pleural effusions, right greater than left. New right infrahilar consolidation / atelectasis.  Low lung volumes.  Mild cardiomegaly.  Mild central pulmonary vascular prominence.  Atheromatous aorta.  IMPRESSION:  1.  Cardiomegaly with bilateral pleural effusions  and   right lower lobe atelectasis/consolidation.   Original Report Authenticated By: D. Andria Rhein, MD   1. Acute respiratory failure   2. Chronic respiratory failure with hypoxia   3. Diastolic CHF, acute on chronic   4. DM2 (diabetes mellitus, type 2)   5. Obesity, morbid (more than 100 lbs over ideal weight or BMI > 40)   6. Pulmonary hypertension, moderate to severe   7. Thyroid disease     Date: 03/08/2013  Rate: 76  Rhythm: normal sinus rhythm  QRS Axis: right  Intervals: PR prolonged  ST/T Wave abnormalities: nonspecific ST changes and nonspecific T wave changes  Conduction Disutrbances:none  Narrative Interpretation: NSR with nonspecific changes  Old EKG Reviewed: unchanged   MDM  [name] 78 y.o. with pulm HTN, CHF, and MI presents for  worsening SOB. AFVSS. intial O2 sat 86%. Goal reportedly 88-92%. At bedside at 90% on 6L [place]. No resp distress. Crackles at bilateral lung bases. BNP elevated. CXR concerning for PNA. Trop neg. ECG unchanged from previous. PO2 low but similar to baseline. PCO2 45. Concern for CHF exacerbation and pneumonia. Patient started on hospital-acquired antibiotics. Patient closely followed by pulmonary critical care who agreed to admit the patient. I discussed this patient's care with my attending, Dr. Rubin Payor.     Sena Hitch, MD 03/08/13 2352

## 2013-03-09 ENCOUNTER — Telehealth: Payer: Self-pay | Admitting: Internal Medicine

## 2013-03-09 DIAGNOSIS — J9620 Acute and chronic respiratory failure, unspecified whether with hypoxia or hypercapnia: Secondary | ICD-10-CM

## 2013-03-09 LAB — CBC
Hemoglobin: 14.2 g/dL (ref 12.0–15.0)
MCH: 29.2 pg (ref 26.0–34.0)
MCV: 89.5 fL (ref 78.0–100.0)
RBC: 4.86 MIL/uL (ref 3.87–5.11)

## 2013-03-09 LAB — BASIC METABOLIC PANEL
CO2: 28 mEq/L (ref 19–32)
Calcium: 9.3 mg/dL (ref 8.4–10.5)
Creatinine, Ser: 1.4 mg/dL — ABNORMAL HIGH (ref 0.50–1.10)
Glucose, Bld: 118 mg/dL — ABNORMAL HIGH (ref 70–99)

## 2013-03-09 LAB — GLUCOSE, CAPILLARY
Glucose-Capillary: 195 mg/dL — ABNORMAL HIGH (ref 70–99)
Glucose-Capillary: 108 mg/dL — ABNORMAL HIGH (ref 70–99)

## 2013-03-09 LAB — PROCALCITONIN: Procalcitonin: <0.1 ng/mL

## 2013-03-09 LAB — PHOSPHORUS: Phosphorus: 4.7 mg/dL — ABNORMAL HIGH (ref 2.3–4.6)

## 2013-03-09 NOTE — Consult Note (Signed)
Reason for Consult: Acute on Chronic Diastolic HF Referring Physician: Critical Care  HPI: The patient is a 78 y/o, morbidly obese female, followed by Dr. Rennis Golden, with a history of right heart failure, pulmonary hypertension and obstructive sleep apnea, intolerant to CPAP. She has had multiple admissions this year for acute on chronic diastolic HF. Her last echo was on 01/12/2013, which demonstrated normal LV function, with an EF of 65-70% and grade I diastolic dysfunction. She presented back to [facility] yesterday with a complaint of progressive resting dyspnea and bilateral lower extremity edema. She notes associated weight gain, orthopnea and PND. She denies any chest pain. She reports both medical and recommended dietary compliance. Since being in the hospital and receiving treatment, she notes significant improvement in her symptoms.    Past Medical History  Diagnosis Date  . Diabetes mellitus   . Hypertension   . Shingles   . GERD (gastroesophageal reflux disease)   . Abnormal heart rhythms   . Thyroid disease     hypthyroidism  . Pulmonary hypertension, moderate to severe     No evidence of CAD on Cath 2011  . OSA on CPAP     not compliant with cpap; study 01/29/2012 - shows AHI of 2.5  . Obesity, morbid (more than 100 lbs over ideal weight or BMI > 40)   . Chronic respiratory failure with hypoxia 01/23/2012    Related to OHS  . Glaucoma   . CHF (congestive heart failure)     Echo at [facility] 10/14/2012 see notes  . Acute on chronic renal failure     Past Surgical History  Procedure Laterality Date  . Tubal ligation    . Ovary removed    . Knee arthroscopy      left  . Cardiovascular stress test  09/15/2006    EF 81%; normal perfusion all regions; LV normal in size; no scintigraphic evidence of inducible myocardial ischemia  . Cardiac catheterization  02/21/2010    EF 65%; PA pressure 86/21 with mean of 44; normal coronary arteries, primary pumonary hypertension; no significant wall motion  abnormalities    Family History  Problem Relation Age of Onset  . Heart attack Father 93  . Heart disease Mother 69  . Diabetes Mother   . Lymphoma Daughter   . Diabetes      siblings, son  . Coronary artery disease Brother   . Diabetes Brother   . Stroke Brother   . Diabetes Brother   . Diabetes Sister   . Hypertension Sister   . Diabetes Sister   . Heart disease Sister   . Hypertension Child   . Sleep apnea Child   . Hypertension Child   . Hypertension Child   . Arthritis/Rheumatoid Child   . Hyperlipidemia Child   . Hypertension Child     Social History:  reports that she has never smoked. Her smokeless tobacco use includes Snuff. She reports that she does not drink alcohol or use illicit drugs.  Allergies:  Allergies  Allergen Reactions  . Darvocet (Propoxyphene-Acetaminophen) Nausea And Vomiting  . Diphenhydramine Hcl Other (See Comments)    Glaucoma prevents patient from taking this medication   . Aloe Vera Rash  . Penicillins Swelling and Rash    Medications:  Prior to Admission medications   Medication Sig Start Date End Date Taking? Authorizing Provider  ALPRAZolam Prudy Feeler) 0.5 MG tablet Take 0.5 mg by mouth 2 (two) times daily.    Yes Historical Provider, MD  aspirin 81  MG EC tablet Take 81 mg by mouth daily. 10/20/12  Yes Brittainy Simmons, PA-C  brimonidine (ALPHAGAN P) 0.1 % SOLN Place 1 drop into both eyes daily.   Yes Historical Provider, MD  bumetanide (BUMEX) 2 MG tablet Take 3 mg by mouth 2 (two) times daily. 12/28/12  Yes Annett Gula, MD  carvedilol (COREG) 3.125 MG tablet Take 2 tablets (6.25 mg total) by mouth 2 (two) times daily with a meal. 12/28/12  Yes Annett Gula, MD  diltiazem (CARDIZEM CD) 120 MG 24 hr capsule Take 120 mg by mouth daily. 01/19/13  Yes Chrystie Nose, MD  ezetimibe (ZETIA) 10 MG tablet Take 10 mg by mouth at bedtime.     Yes Historical Provider, MD  glipiZIDE (GLUCOTROL) 10 MG tablet Take 10 mg by mouth 2 (two) times daily  before a meal.     Yes Historical Provider, MD  lansoprazole (PREVACID) 30 MG capsule Take 30 mg by mouth every morning.    Yes Historical Provider, MD  levothyroxine (SYNTHROID, LEVOTHROID) 150 MCG tablet Take 150 mcg by mouth daily before breakfast.    Yes Historical Provider, MD  linagliptin (TRADJENTA) 5 MG TABS tablet Take 5 mg by mouth daily.   Yes Historical Provider, MD  losartan (COZAAR) 25 MG tablet Take 1 tablet (25 mg total) by mouth 2 (two) times daily. 12/28/12  Yes Annett Gula, MD  oxyCODONE-acetaminophen (PERCOCET/ROXICET) 5-325 MG per tablet Take 1 tablet by mouth 2 (two) times daily. May take 1 additional dose if needed for pain   Yes Historical Provider, MD  potassium chloride SA (K-DUR,KLOR-CON) 20 MEQ tablet Take 20 mEq by mouth 2 (two) times daily. 10/20/12  Yes Brittainy Simmons, PA-C  pravastatin (PRAVACHOL) 20 MG tablet Take 20 mg by mouth at bedtime.    Yes Historical Provider, MD  sulfamethoxazole-trimethoprim (BACTRIM DS) 800-160 MG per tablet Take 1 tablet by mouth 2 (two) times daily. Take for 10 days. Scheduled to begin taking on 03/08/13. 03/08/13  Yes Historical Provider, MD  Travoprost, BAK Free, (TRAVATAN) 0.004 % SOLN ophthalmic solution Place 1 drop into both eyes at bedtime.   Yes Historical Provider, MD     Results for orders placed during the hospital encounter of 03/08/13 (from the past 48 hour(s))  PRO B NATRIURETIC PEPTIDE     Status: Abnormal   Collection Time    03/08/13  6:26 PM      Result Value Range   Pro B Natriuretic peptide (BNP) 4244.0 (*) 0 - 450 pg/mL  CBC WITH DIFFERENTIAL     Status: Abnormal   Collection Time    03/08/13  6:27 PM      Result Value Range   WBC 8.7  4.0 - 10.5 K/uL   RBC 5.14 (*) 3.87 - 5.11 MIL/uL   Hemoglobin 15.2 (*) 12.0 - 15.0 g/dL   HCT 95.6 (*) 21.3 - 08.6 %   MCV 89.9  78.0 - 100.0 fL   MCH 29.6  26.0 - 34.0 pg   MCHC 32.9  30.0 - 36.0 g/dL   RDW 57.8  46.9 - 62.9 %   Platelets 139 (*) 150 - 400 K/uL    Neutrophils Relative % 67  43 - 77 %   Neutro Abs 5.9  1.7 - 7.7 K/uL   Lymphocytes Relative 20  12 - 46 %   Lymphs Abs 1.7  0.7 - 4.0 K/uL   Monocytes Relative 10  3 - 12 %   Monocytes Absolute  0.9  0.1 - 1.0 K/uL   Eosinophils Relative 2  0 - 5 %   Eosinophils Absolute 0.2  0.0 - 0.7 K/uL   Basophils Relative 1  0 - 1 %   Basophils Absolute 0.1  0.0 - 0.1 K/uL  COMPREHENSIVE METABOLIC PANEL     Status: Abnormal   Collection Time    03/08/13  6:27 PM      Result Value Range   Sodium 138  135 - 145 mEq/L   Potassium 5.2 (*) 3.5 - 5.1 mEq/L   Chloride 103  96 - 112 mEq/L   CO2 24  19 - 32 mEq/L   Glucose, Bld 149 (*) 70 - 99 mg/dL   BUN 31 (*) 6 - 23 mg/dL   Creatinine, Ser 1.61 (*) 0.50 - 1.10 mg/dL   Calcium 9.5  8.4 - 09.6 mg/dL   Total Protein 6.2  6.0 - 8.3 g/dL   Albumin 3.5  3.5 - 5.2 g/dL   AST 19  0 - 37 U/L   ALT 10  0 - 35 U/L   Alkaline Phosphatase 122 (*) 39 - 117 U/L   Total Bilirubin 0.5  0.3 - 1.2 mg/dL   GFR calc non Af Amer 37 (*) >90 mL/min   GFR calc Af Amer 43 (*) >90 mL/min   Comment:            The eGFR has been calculated     using the CKD EPI equation.     This calculation has not been     validated in all clinical     situations.     eGFR's persistently     <90 mL/min signify     possible Chronic Kidney Disease.  PROCALCITONIN     Status: None   Collection Time    03/08/13  6:30 PM      Result Value Range   Procalcitonin <0.10     Comment:            Interpretation:     PCT (Procalcitonin) <= 0.5 ng/mL:     Systemic infection (sepsis) is not likely.     Local bacterial infection is possible.     (NOTE)             ICU PCT Algorithm               Non ICU PCT Algorithm        ----------------------------     ------------------------------             PCT < 0.25 ng/mL                 PCT < 0.1 ng/mL         Stopping of antibiotics            Stopping of antibiotics           strongly encouraged.               strongly encouraged.         ----------------------------     ------------------------------           PCT level decrease by               PCT < 0.25 ng/mL           >= 80% from peak PCT           OR PCT 0.25 - 0.5 ng/mL  Stopping of antibiotics                                                 encouraged.         Stopping of antibiotics               encouraged.        ----------------------------     ------------------------------           PCT level decrease by              PCT >= 0.25 ng/mL           < 80% from peak PCT            AND PCT >= 0.5 ng/mL            Continuing antibiotics                                                  encouraged.           Continuing antibiotics                encouraged.        ----------------------------     ------------------------------         PCT level increase compared          PCT > 0.5 ng/mL             with peak PCT AND              PCT >= 0.5 ng/mL             Escalation of antibiotics                                              strongly encouraged.          Escalation of antibiotics            strongly encouraged.  POCT I-STAT TROPONIN I     Status: None   Collection Time    03/08/13  6:53 PM      Result Value Range   Troponin i, poc 0.02  0.00 - 0.08 ng/mL   Comment 3            Comment: Due to the release kinetics of cTnI,     a negative result within the first hours     of the onset of symptoms does not rule out     myocardial infarction with certainty.     If myocardial infarction is still suspected,     repeat the test at appropriate intervals.  POCT I-STAT 3, BLOOD GAS (G3+)     Status: Abnormal   Collection Time    03/08/13  7:23 PM      Result Value Range   pH, Arterial 7.332 (*) 7.350 - 7.450   pCO2 arterial 45.5 (*) 35.0 - 45.0 mmHg   pO2, Arterial 53.0 (*) 80.0 - 100.0 mmHg   Bicarbonate 24.1 (*) 20.0 - 24.0 mEq/L   TCO2 25  0 - 100 mmol/L   O2  Saturation 84.0     Acid-base deficit 2.0  0.0 - 2.0 mmol/L   Patient temperature 98.6 F      Collection site BRACHIAL ARTERY     Drawn by Operator     Sample type ARTERIAL    MRSA PCR SCREENING     Status: None   Collection Time    03/08/13 11:20 PM      Result Value Range   MRSA by PCR NEGATIVE  NEGATIVE   Comment:            The GeneXpert MRSA Assay (FDA     approved for NASAL specimens     only), is one component of a     comprehensive MRSA colonization     surveillance program. It is not     intended to diagnose MRSA     infection nor to guide or     monitor treatment for     MRSA infections.  GLUCOSE, CAPILLARY     Status: Abnormal   Collection Time    03/08/13 11:58 PM      Result Value Range   Glucose-Capillary 195 (*) 70 - 99 mg/dL  CBC     Status: Abnormal   Collection Time    03/09/13  5:00 AM      Result Value Range   WBC 6.7  4.0 - 10.5 K/uL   RBC 4.86  3.87 - 5.11 MIL/uL   Hemoglobin 14.2  12.0 - 15.0 g/dL   HCT 40.9  81.1 - 91.4 %   MCV 89.5  78.0 - 100.0 fL   MCH 29.2  26.0 - 34.0 pg   MCHC 32.6  30.0 - 36.0 g/dL   RDW 78.2  95.6 - 21.3 %   Platelets 125 (*) 150 - 400 K/uL  BASIC METABOLIC PANEL     Status: Abnormal   Collection Time    03/09/13  5:00 AM      Result Value Range   Sodium 137  135 - 145 mEq/L   Potassium 4.0  3.5 - 5.1 mEq/L   Comment: DELTA CHECK NOTED   Chloride 100  96 - 112 mEq/L   CO2 28  19 - 32 mEq/L   Glucose, Bld 118 (*) 70 - 99 mg/dL   BUN 30 (*) 6 - 23 mg/dL   Creatinine, Ser 0.86 (*) 0.50 - 1.10 mg/dL   Calcium 9.3  8.4 - 57.8 mg/dL   GFR calc non Af Amer 36 (*) >90 mL/min   GFR calc Af Amer 41 (*) >90 mL/min   Comment:            The eGFR has been calculated     using the CKD EPI equation.     This calculation has not been     validated in all clinical     situations.     eGFR's persistently     <90 mL/min signify     possible Chronic Kidney Disease.  MAGNESIUM     Status: None   Collection Time    03/09/13  5:00 AM      Result Value Range   Magnesium 2.0  1.5 - 2.5 mg/dL  PHOSPHORUS     Status:  Abnormal   Collection Time    03/09/13  5:00 AM      Result Value Range   Phosphorus 4.7 (*) 2.3 - 4.6 mg/dL  PROCALCITONIN     Status: None   Collection Time    03/09/13  5:00  AM      Result Value Range   Procalcitonin <0.10     Comment:            Interpretation:     PCT (Procalcitonin) <= 0.5 ng/mL:     Systemic infection (sepsis) is not likely.     Local bacterial infection is possible.     (NOTE)             ICU PCT Algorithm               Non ICU PCT Algorithm        ----------------------------     ------------------------------             PCT < 0.25 ng/mL                 PCT < 0.1 ng/mL         Stopping of antibiotics            Stopping of antibiotics           strongly encouraged.               strongly encouraged.        ----------------------------     ------------------------------           PCT level decrease by               PCT < 0.25 ng/mL           >= 80% from peak PCT           OR PCT 0.25 - 0.5 ng/mL          Stopping of antibiotics                                                 encouraged.         Stopping of antibiotics               encouraged.        ----------------------------     ------------------------------           PCT level decrease by              PCT >= 0.25 ng/mL           < 80% from peak PCT            AND PCT >= 0.5 ng/mL            Continuing antibiotics                                                  encouraged.           Continuing antibiotics                encouraged.        ----------------------------     ------------------------------         PCT level increase compared          PCT > 0.5 ng/mL             with peak PCT AND              PCT >= 0.5 ng/mL  Escalation of antibiotics                                              strongly encouraged.          Escalation of antibiotics            strongly encouraged.  GLUCOSE, CAPILLARY     Status: Abnormal   Collection Time    03/09/13 12:05 PM      Result Value Range    Glucose-Capillary 154 (*) 70 - 99 mg/dL   Comment 1 Notify RN      Dg Chest 2 View  03/08/2013   *RADIOLOGY REPORT*  Clinical Data: Shortness of breath  CHEST - 2 VIEW  Comparison: 01/11/2013  Findings: Bilateral pleural effusions, right greater than left. New right infrahilar consolidation / atelectasis.  Low lung volumes.  Mild cardiomegaly.  Mild central pulmonary vascular prominence.  Atheromatous aorta.  IMPRESSION:  1.  Cardiomegaly with bilateral pleural effusions and   right lower lobe atelectasis/consolidation.   Original Report Authenticated By: D. Andria Rhein, MD    Review of Systems  Respiratory: Positive for shortness of breath.   Cardiovascular: Positive for orthopnea, leg swelling and PND. Negative for chest pain.  All other systems reviewed and are negative.   Blood pressure 98/61, pulse 75, temperature 98.2 F (36.8 C), temperature source Oral, resp. rate 18, height 5\' 1"  (1.549 m), weight 236 lb 15.9 oz (107.5 kg), SpO2 86.00%. Physical Exam  Constitutional: She is oriented to person, place, and time. She appears well-developed and well-nourished. No distress.  HENT:  Head: Normocephalic and atraumatic.  Eyes: Conjunctivae and EOM are normal. Pupils are equal, round, and reactive to light.  Neck: JVD (mild) present.  Cardiovascular: Normal rate, regular rhythm, normal heart sounds and intact distal pulses.  Exam reveals no gallop and no friction rub.   No murmur heard. Pulses:      Radial pulses are 2+ on the right side, and 2+ on the left side.       Dorsalis pedis pulses are 1+ on the right side, and 1+ on the left side.  Respiratory: Effort normal. No respiratory distress. She has no wheezes. She has rales (bilateral bases).  GI: Soft. Bowel sounds are normal. She exhibits no distension and no mass. There is no tenderness.  Musculoskeletal: She exhibits edema (bilateral 2+ lower extremity edema).  Neurological: She is alert and oriented to person, place, and  time.  Skin: Skin is warm and dry. She is not diaphoretic.  Psychiatric: She has a normal mood and affect. Her behavior is normal.    Assessment/Plan: Principal Problem:   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 - With Acute on Chronic exaverbation Active Problems:   DM2 (diabetes mellitus, type 2)   Obesity, morbid (more than 100 lbs over ideal weight or BMI > 40)   Pulmonary hypertension, moderate to severe; Severe by Echo 09/2012   OSA (obstructive sleep apnea)  Plan: Admitted for A/C diastolic HF and bilateral pleural effusions. BNP of 4,244. She is currently on 6L of supplemental O2 with O2 sats in the upper 80s- low 90s. Continue with IV diuretics. Agree with increasing Bumex dose and supplementing with metolazone. Continue with I/Os. Net loss since admission ~ 1 L. Continue daily weights. Her last office weight on 01/19/13 was 220 lbs. Weight today is 236 lbs.  Recheck BNP in 1-2 days. Monitor renal function. Replete K+ as needed. Dr. Rennis Golden has been made aware that patient has been admitted. We will continue to follow along.    Allayne Butcher, PA-C 03/09/2013, 1:54 PM     Agree with note written by Boyce Medici  PAC  Pt well known to our practice with diastolic HF and multiple admissions. Also has OSA. Diuresing. Meds adjusted. Still wet on exam. CXR shows Right pleural effusion and interstitial edema. BNP elevated as well. Continue to diurese. Would benefit from HF consult and OP clinic. We will arrange. Runell Gess 03/09/2013 4:00 PM

## 2013-03-09 NOTE — Telephone Encounter (Signed)
Message forwarded to Dr. Hilty.  

## 2013-03-09 NOTE — ED Provider Notes (Signed)
I saw and evaluated the patient, reviewed the resident's note and I agree with the findings and plan and agree with their ECG interpretation. Patient with shortness of breath. She some chronic relatively high flow oxygen at home. X-ray showed possible pneumonia. May also have pulmonary hypertension upon. Will be admitted.  [name]. Rubin Payor, MD 03/09/13 [phone]

## 2013-03-09 NOTE — Progress Notes (Signed)
PULMONARY  / CRITICAL CARE MEDICINE  Name: [name] MRN: [id number] DOB: [birth year]    ADMISSION DATE:  03/08/2013 CONSULTATION DATE:  03/08/2013  REFERRING MD :  EDP PRIMARY SERVICE:  PCCM  CHIEF COMPLAINT:  Dyspnea  BRIEF PATIENT DESCRIPTION: 78 yo with morbid obesity, OSA/OHS (not on CPAP/BiPAP), chronic hypoxemic respiratory failure on 6-8 L/minof oxygen, chronic diastolic CHF and pulmonary hypertension (Dr. Evlyn Courier office patient) presenting to [facility] ED with progressive dyspnea and symmetrical lower extremity edema.  Reports orthopnea and paroxysmal nocturnal dyspnea.  Reports weight gain form 219 lb baseline to 233 lb recently.  Also reports dry cough which is new.  Denies fever, chills, purulent sputum, hemoptysis.  There were no palpitations or chest pain.  SIGNIFICANT EVENTS / STUDIES:   LINES / TUBES:  CULTURES:  ANTIBIOTICS: Cefepime 7/14 x 1 Levofloxacin 7/15 >>>7/15  SUBJECTIVE:   VITAL SIGNS: Temp:  [97.3 F (36.3 C)-97.6 F (36.4 C)] 97.6 F (36.4 C) (07/15 0407) Pulse Rate:  [70-84] 76 (07/15 0829) Resp:  [17-22] 17 (07/15 0800) BP: (83-115)/(46-76) 91/46 mmHg (07/15 1053) SpO2:  [80 %-88 %] 88 % (07/15 0800) Weight:  [107.5 kg (236 lb 15.9 oz)] 107.5 kg (236 lb 15.9 oz) (07/15 0407)  PHYSICAL EXAMINATION: General:  Morbidly obese, no acute distress Neuro:  Awake, alert, oriented, nonfocal HEENT:  PERRL Neck:  No overt JVD Cardiovascular:  Regular, no murmurs, distant heart sounds, ankle edema Lungs:  Bilateral air entry, crackles posterior base Abdomen:  Obese, soft, active BS Musculoskeletal:  Moves all extremities, 3+ symmetrical pitting pedal edema Skin:  Intact   Recent Labs Lab 03/08/13 1827 03/09/13 0500  NA 138 137  K 5.2* 4.0  CL 103 100  CO2 24 28  BUN 31* 30*  CREATININE 1.37* 1.40*  GLUCOSE 149* 118*    Recent Labs Lab 03/08/13 1827 03/09/13 0500  HGB 15.2* 14.2  HCT 46.2* 43.5  WBC 8.7 6.7  PLT 139* 125*   Dg Chest  2 View  03/08/2013   *RADIOLOGY REPORT*  Clinical Data: Shortness of breath  CHEST - 2 VIEW  Comparison: 01/11/2013  Findings: Bilateral pleural effusions, right greater than left. New right infrahilar consolidation / atelectasis.  Low lung volumes.  Mild cardiomegaly.  Mild central pulmonary vascular prominence.  Atheromatous aorta.  IMPRESSION:  1.  Cardiomegaly with bilateral pleural effusions and   right lower lobe atelectasis/consolidation.   Original Report Authenticated By: D. Andria Rhein, MD   ASSESSMENT / PLAN:  Acute exacerbation of chronic diastolic CHF Bilateral pleural effusions RLL atelectasis secondary to effusion, less likely CAP Morbid obesity/OSA/OHS Pulmonary hypertension Acute on chronic hypoxemic respiratory failure HTN DM Hypothyroidism Chronic renal failure Hyperkalemia GERD Chronic back pain Glaucoma  - D/C Levofloxacin - Continue to maintain negative fluid balance - Bipap at night - PT/OT to work with patient - F/U BMP - Monitor and replete potassium as necessary - Follow CBG - Continue SSI - D/C oral hypoglycemic agents with change in renal function, and hypoglycemia  Today's Summary: Pt reports feeling better with fluid removal. Will continue aggressive diuresis today with a goal of net negative.   Benjamin Hocutt S-ACNP   I have interviewed and examined the patient and reviewed the database. I have formulated the assessment and plan as reflected in the note above with amendments made by me.  Billy Fischer, MD;  PCCM service; Mobile [phone]

## 2013-03-09 NOTE — Progress Notes (Signed)
Utilization Review Completed. 03/09/2013  

## 2013-03-09 NOTE — Telephone Encounter (Signed)
Daughter called to notify Dr Rennis Golden that Jeronica is a pt in [facility]! Said he told them that from now on if she was admitted- please call his office to notify him!

## 2013-03-10 ENCOUNTER — Encounter (HOSPITAL_COMMUNITY): Payer: Self-pay | Admitting: Pulmonary Disease

## 2013-03-10 DIAGNOSIS — I959 Hypotension, unspecified: Secondary | ICD-10-CM

## 2013-03-10 DIAGNOSIS — G4733 Obstructive sleep apnea (adult) (pediatric): Secondary | ICD-10-CM

## 2013-03-10 DIAGNOSIS — J189 Pneumonia, unspecified organism: Secondary | ICD-10-CM | POA: Diagnosis present

## 2013-03-10 LAB — GLUCOSE, CAPILLARY
Glucose-Capillary: 93 mg/dL (ref 70–99)
Glucose-Capillary: 159 mg/dL — ABNORMAL HIGH (ref 70–99)
Glucose-Capillary: 160 mg/dL — ABNORMAL HIGH (ref 70–99)

## 2013-03-10 LAB — PROCALCITONIN: Procalcitonin: <0.1 ng/mL

## 2013-03-10 NOTE — Progress Notes (Addendum)
Subjective:  SOB improving  Objective:  Vital Signs in the last 24 hours: Temp:  [97.6 F (36.4 C)-98.4 F (36.9 C)] 98.2 F (36.8 C) (07/16 0800) Pulse Rate:  [72-78] 78 (07/16 0812) Resp:  [14-21] 15 (07/16 0800) BP: (81-103)/(43-85) 90/53 mmHg (07/16 0812) SpO2:  [86 %-92 %] 92 % (07/16 0800) Weight:  [236 lb 15.9 oz (107.5 kg)] 236 lb 15.9 oz (107.5 kg) (07/16 0419)  Intake/Output from previous day:  Intake/Output Summary (Last 24 hours) at 03/10/13 0847 Last data filed at 03/10/13 0653  Gross per 24 hour  Intake    480 ml  Output   1750 ml  Net  -1270 ml    Physical Exam: General appearance: alert, cooperative, no distress and morbidly obese Lungs: basilar rales bilat Heart: regular rate and rhythm Extremities: 2+ edema   Rate: 78  Rhythm: normal sinus rhythm  Lab Results:  Recent Labs  03/08/13 1827 03/09/13 0500  WBC 8.7 6.7  HGB 15.2* 14.2  PLT 139* 125*    Recent Labs  03/08/13 1827 03/09/13 0500  NA 138 137  K 5.2* 4.0  CL 103 100  CO2 24 28  GLUCOSE 149* 118*  BUN 31* 30*  CREATININE 1.37* 1.40*   No results found for this basename: TROPONINI, CK, MB,  in the last 72 hours Hepatic Function Panel  Recent Labs  03/08/13 1827  PROT 6.2  ALBUMIN 3.5  AST 19  ALT 10  ALKPHOS 122*  BILITOT 0.5   Imaging: Imaging results have been reviewed  Cardiac Studies:  Assessment/Plan:   Principal Problem:   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 - With Acute on Chronic exaverbation Active Problems:   Pulmonary hypertension, moderate to severe; Severe by Echo 09/2012   Chronic respiratory failure with hypoxia   DM2 (diabetes mellitus, type 2)   Obesity, morbid (more than 100 lbs over ideal weight or BMI > 40)   Hypotension   Acute renal insufficiency   OSA (obstructive sleep apnea)- C-pap intol   Pneumonia, organism unspecified- on Levaquin Q 48hrs   Unspecified hypothyroidism   Normal coronary arteries at cath 2011  Diastolic CHF, chronic- EF 65-70% May 2014    PLAN: Consider CHF consult as mentioned by Dr Allyson Sabal in this complicated chronic heart failure pt with multiple co morbidities. Hold Diltiazem, Coreg, and Cozaar for systolic B/P less than 100 (B/P 90 this am).  She is diuresing though her wgt remains unchanged.   [name] PA-C Beeper [phone] 03/10/2013, 8:47 AM  I have seen and evaluated the patient this AM along with [name], PA. I agree with his findings, examination as well as impression recommendations.  Morbidly obese woman with ? OSA/OHS readmitted for recurrent RHF. Responding to IV diuretic -- I suspect that with her RHF, she would have some mild hepatic congestion & peritoneal edeam/ascites that reduces bioavailability of PO diuretics. Admitted for IV diuresis.  Renal Fxn stable - continue with IV diuresis & follow renal Fxn & electrolytes.    Recheck pro-BNP in ~2 more days. BiPAP o/n per PCCM    Agree with avoiding oral hypoglycemics given the labile renal Fxn.  She is well known to Dr. Rennis Golden - would defer decision re: involving CHF service to him, but I do feel that Pulm HTN & RHF management may be better treated through CHF clinic for intermittent IV Diuresis to avoid further hospitalizations.  Dr. Shirlee Latch is covering CHF Service, will alert him to potential consult.  ? Benefit  of adding inotrope support to facilitate diuresis given her relative hypotension -- having to hold CCB, BB & ARB for BPs consistently in 80s-90s.   MD Time with pt: 10 min  HARDING,DAVID W, M.D., M.S. [facility] [address] [place], [place]  [address]  [phone] Pager # [phone] 03/10/2013 11:09 AM

## 2013-03-10 NOTE — Progress Notes (Signed)
PULMONARY  / CRITICAL CARE MEDICINE  Name: [name] MRN: [id number] DOB: 1935/02/28    ADMISSION DATE:  03/08/2013 CONSULTATION DATE:  03/08/2013  REFERRING MD :  EDP PRIMARY SERVICE:  PCCM  CHIEF COMPLAINT:  Dyspnea  BRIEF PATIENT DESCRIPTION:  78 yo female with dyspnea and lower extremity edema from cor pulmonale and acute on chronic diastolic dysfx with hx of OHS.  SIGNIFICANT EVENTS: 7/14 Admit, cardiology consult  STUDIES:  PSG 01/29/12>>AHI 2.5, PLMI 28.1. Needed 3 liters oxygen.  PFT 03/10/12>>FEV1 1.65 (100%), FEV1% 78, TLC 3.96 (95%), DLCO 53, no BD  01/12/13 Echo >> PA 83 mmHg, mod LVH, EF 65 to 70%  Doppler legs 01/12/13 >> no DVT  V/Q scan 01/12/13 >> no PE  ANTIBIOTICS: Cefepime 7/14 x 1 Levofloxacin 7/15 >>>7/15  SUBJECTIVE:  Breathing better.  Less leg swelling.  Denies chest pain or cough.  VITAL SIGNS: Temp:  [97.6 F (36.4 C)-98.4 F (36.9 C)] 98.2 F (36.8 C) (07/16 0800) Pulse Rate:  [72-78] 78 (07/16 0812) Resp:  [14-21] 15 (07/16 0800) BP: (81-103)/(43-85) 88/51 mmHg (07/16 1042) SpO2:  [86 %-92 %] 92 % (07/16 0800) Weight:  [236 lb 15.9 oz (107.5 kg)] 236 lb 15.9 oz (107.5 kg) (07/16 0419) Oximyzer at 6 liters  I/O last 3 completed shifts: In: 600 [P.O.:600] Out: 2925 [Urine:2925]  Weight: Wt Readings from Last 3 Encounters:  03/10/13 236 lb 15.9 oz (107.5 kg)  02/22/13 223 lb 3.2 oz (101.243 kg)  01/19/13 220 lb (99.791 kg)    PHYSICAL EXAMINATION: General: No distress, sitting in chair Neuro: Alert, follows commands HEENT: No sinus tenderness Cardiovascular:  regular Lungs: basilar crackles Abdomen: soft, non tender Musculoskeletal: 2+ edema Skin: no rashes  Labs: CBC Recent Labs     03/08/13  1827  03/09/13  0500  WBC  8.7  6.7  HGB  15.2*  14.2  HCT  46.2*  43.5  PLT  139*  125*    BMET Recent Labs     03/08/13  1827  03/09/13  0500  NA  138  137  K  5.2*  4.0  CL  103  100  CO2  24  28  BUN  31*  30*   CREATININE  1.37*  1.40*  GLUCOSE  149*  118*    Electrolytes Recent Labs     03/08/13  1827  03/09/13  0500  CALCIUM  9.5  9.3  MG   --   2.0  PHOS   --   4.7*    Sepsis Markers Recent Labs     03/08/13  1830  03/09/13  0500  03/10/13  0347  PROCALCITON  <0.10  <0.10  <0.10    ABG Recent Labs     03/08/13  1923  PHART  7.332*  PCO2ART  45.5*  PO2ART  53.0*    Liver Enzymes Recent Labs     03/08/13  1827  AST  19  ALT  10  ALKPHOS  122*  BILITOT  0.5  ALBUMIN  3.5    Cardiac Enzymes Recent Labs     03/08/13  1826  PROBNP  4244.0*    Glucose Recent Labs     03/08/13  2358  03/09/13  0828  03/09/13  1205  03/09/13  1645  03/09/13  2107  03/10/13  0803  GLUCAP  195*  116*  154*  149*  108*  93    Imaging Dg Chest 2 View  03/08/2013   *  RADIOLOGY REPORT*  Clinical Data: Shortness of breath  CHEST - 2 VIEW  Comparison: 01/11/2013  Findings: Bilateral pleural effusions, right greater than left. New right infrahilar consolidation / atelectasis.  Low lung volumes.  Mild cardiomegaly.  Mild central pulmonary vascular prominence.  Atheromatous aorta.  IMPRESSION:  1.  Cardiomegaly with bilateral pleural effusions and   right lower lobe atelectasis/consolidation.   Original Report Authenticated By: D. Andria Rhein, MD      ASSESSMENT / PLAN:  A: Acute on chronic respiratory failure with acute pulmonary edema 2nd to cor pulmonale from OHS. Sleep study from June 2013 was negative for sleep apnea. Atelectasis. P: -continue high flow oxygen with goal SpO2 > 88% -?if she should have bubble study to r/o shunt -bronchial hygiene -f/u CXR -doubt infection >> d/c levaquin  A: Acute on chronic diastolic heart failure. Hyperlipidemia. P: -per cardiology -SQ heparin for DVT prevention  A: DM type II. Hypothyroidism. P: -continue glucotrol, tradjenta -SSI -continue levothyroxine  A: CKD. P: -monitor renal fx  A: Thrombocytopenia. P: -f/u CBC  intermittently  A: Glaucoma. P: -continue travatan, alphagan  A: Anxiety. Pain control. P: -prn percocet -xanax BID  Discussed with Dr. Morton Stall, MD [facility] Pulmonary/Critical Care 03/10/2013, 11:53 AM Pager:  [phone] After 3pm call: [phone]

## 2013-03-11 DIAGNOSIS — I319 Disease of pericardium, unspecified: Secondary | ICD-10-CM

## 2013-03-11 LAB — BASIC METABOLIC PANEL
BUN: 36 mg/dL — ABNORMAL HIGH (ref 6–23)
CO2: 28 mEq/L (ref 19–32)
Calcium: 9.1 mg/dL (ref 8.4–10.5)
Chloride: 99 mEq/L (ref 96–112)
Creatinine, Ser: 1.5 mg/dL — ABNORMAL HIGH (ref 0.50–1.10)
GFR calc Af Amer: 38 mL/min — ABNORMAL LOW (ref >90)
GFR calc non Af Amer: 33 mL/min — ABNORMAL LOW (ref >90)
Glucose, Bld: 117 mg/dL — ABNORMAL HIGH (ref 70–99)
Potassium: 3.5 mEq/L (ref 3.5–5.1)
Sodium: 138 mEq/L (ref 135–145)

## 2013-03-11 LAB — GLUCOSE, CAPILLARY: Glucose-Capillary: 126 mg/dL — ABNORMAL HIGH (ref 70–99)

## 2013-03-11 LAB — PRO B NATRIURETIC PEPTIDE: Pro B Natriuretic peptide (BNP): 4207 pg/mL — ABNORMAL HIGH (ref 0–450)

## 2013-03-11 NOTE — Progress Notes (Signed)
Echo Lab  2D Echocardiogram completed.  Hawthorne Day L Shaira Sova, RDCS 03/11/2013 11:50 AM

## 2013-03-11 NOTE — Clinical Documentation Improvement (Signed)
THIS DOCUMENT IS NOT A PERMANENT PART OF THE MEDICAL RECORD  Please update your documentation with the medical record to reflect your response to this query. If you need help knowing how to do this please call [phone].  03/11/13   Dear Dr. Coralyn Helling Marton Redwood,  In a better effort to capture your patient's severity of illness, reflect appropriate length of stay and utilization of resources, a review of the patient medical record has revealed the following indicators.    Based on your clinical judgment, please clarify and document in a progress note and/or discharge summary the clinical condition associated with the following supporting information:  You may use possible, probable, or suspect with inpatient documentation. possible, probable, suspected diagnoses MUST be documented at the time of discharge.   In responding to this query please exercise your independent judgment.  The fact that a query is asked, does not imply that any particular answer is desired or expected.  Possible Clinical Conditions?   _______CKD Stage II - GFR 60-80 _______CKD Stage III - GFR 30-59 _______CKD Stage IV - GFR 15-29 _______CKD Stage V - GFR < 15 _______ESRD (End Stage Renal Disease) _______Other condition_____________ _______Cannot Clinically determine   Supporting Information:  Risk Factors: Acute on chronic renal failure, CKD   Signs & Symptoms: Per 7/15 consult note   BUN 30 ,  Creatinine, Ser 1.40,  GFR 36   Treatment: monitor renal function per 03/10/13 progress note.   Reviewed:  no additional documentation provided   Thank You,  Darlene H. Excell Seltzer RN, BSN, CCM  Clinical Documentation Specialist Phone : [phone]

## 2013-03-11 NOTE — Progress Notes (Signed)
Subjective:  She says her SOB and edema are better than when she came in  Objective:  Vital Signs in the last 24 hours: Temp:  [97 F (36.1 C)-97.9 F (36.6 C)] 97.9 F (36.6 C) (07/17 0417) Pulse Rate:  [72-83] 83 (07/17 0417) Resp:  [14-22] 20 (07/17 0417) BP: (86-104)/(46-73) 94/58 mmHg (07/17 0417) SpO2:  [83 %-91 %] 90 % (07/17 0417) Weight:  [236 lb 15.9 oz (107.5 kg)] 236 lb 15.9 oz (107.5 kg) (07/17 0417)  Intake/Output from previous day:  Intake/Output Summary (Last 24 hours) at 03/11/13 0805 Last data filed at 03/11/13 0420  Gross per 24 hour  Intake   1080 ml  Output   1900 ml  Net   -820 ml    Physical Exam: General appearance: alert, cooperative, no distress and morbidly obese Lungs: rales 1/3 way up bilateraly Heart: regular rate and rhythm Extremities: 1-2+ edema   Rate: 82  Rhythm: normal sinus rhythm  Lab Results:  Recent Labs  03/08/13 1827 03/09/13 0500  WBC 8.7 6.7  HGB 15.2* 14.2  PLT 139* 125*    Recent Labs  03/09/13 0500 03/11/13 0625  NA 137 138  K 4.0 3.5  CL 100 99  CO2 28 28  GLUCOSE 118* 117*  BUN 30* 36*  CREATININE 1.40* 1.50*   No results found for this basename: TROPONINI, CK, MB,  in the last 72 hours Hepatic Function Panel  Recent Labs  03/08/13 1827  PROT 6.2  ALBUMIN 3.5  AST 19  ALT 10  ALKPHOS 122*  BILITOT 0.5   No results found for this basename: CHOL,  in the last 72 hours No results found for this basename: INR,  in the last 72 hours  Imaging: Imaging results have been reviewed  Cardiac Studies:  Assessment/Plan:   Principal Problem:   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 Her wgt is unchanged. Sleep study reportedly negative June 2013.   [name] PA-C Beeper [phone] 03/11/2013, 8:05 AM

## 2013-03-11 NOTE — Progress Notes (Signed)
Pt. Seen and examined. Agree with the NP/PA-C note as written.  She is ~3L net negative, renal function is worsening. BNP is stable, which may speak to a lack of utility in her. No significant weight change. Will order limited echo bubble study and review - if she has a R/L shunt, I would expect it to be intrapulmonary.  May need to slow up diuresis. Changed diet to low sodium with fluid intake restriction of 1.5L.  Chrystie Nose, MD, [facility] Attending Cardiologist [facility]

## 2013-03-11 NOTE — Progress Notes (Signed)
PULMONARY  / CRITICAL CARE MEDICINE  Name: [name] MRN: [id number] DOB: Jun 03, 1935    ADMISSION DATE:  03/08/2013 CONSULTATION DATE:  03/08/2013  REFERRING MD :  EDP PRIMARY SERVICE:  PCCM  CHIEF COMPLAINT:  Dyspnea  BRIEF PATIENT DESCRIPTION:  78 yo female with dyspnea and lower extremity edema from cor pulmonale and acute on chronic diastolic dysfx with hx of OHS.  SIGNIFICANT EVENTS: 7/14 Admit, cardiology consult  STUDIES:  PSG 01/29/12>>AHI 2.5, PLMI 28.1. Needed 3 liters oxygen.  PFT 03/10/12>>FEV1 1.65 (100%), FEV1% 78, TLC 3.96 (95%), DLCO 53, no BD  01/12/13 Echo >> PA 83 mmHg, mod LVH, EF 65 to 70%  Doppler legs 01/12/13 >> no DVT  V/Q scan 01/12/13 >> no PE  ANTIBIOTICS: Cefepime 7/14 x 1 Levofloxacin 7/15 >>>7/15  SUBJECTIVE:  Feels better.  VITAL SIGNS: Temp:  [97 F (36.1 C)-98.1 F (36.7 C)] 98.1 F (36.7 C) (07/17 0841) Pulse Rate:  [72-85] 85 (07/17 0841) Resp:  [14-23] 23 (07/17 0841) BP: (86-109)/(46-73) 109/68 mmHg (07/17 1058) SpO2:  [83 %-91 %] 88 % (07/17 0841) Weight:  [236 lb 15.9 oz (107.5 kg)] 236 lb 15.9 oz (107.5 kg) (07/17 0417) Oximyzer at 6 liters  I/O last 3 completed shifts: In: 1080 [P.O.:1080] Out: 3900 [Urine:3900]  Weight: Wt Readings from Last 3 Encounters:  03/11/13 236 lb 15.9 oz (107.5 kg)  02/22/13 223 lb 3.2 oz (101.243 kg)  01/19/13 220 lb (99.791 kg)    PHYSICAL EXAMINATION: General: No distress, sitting in chair Neuro: Alert, follows commands HEENT: No sinus tenderness Cardiovascular:  regular Lungs: decreased breath sounds, no wheeze Abdomen: soft, non tender Musculoskeletal: 2+ edema Skin: no rashes  Labs: CBC Recent Labs     03/08/13  1827  03/09/13  0500  WBC  8.7  6.7  HGB  15.2*  14.2  HCT  46.2*  43.5  PLT  139*  125*    BMET Recent Labs     03/08/13  1827  03/09/13  0500  03/11/13  0625  NA  138  137  138  K  5.2*  4.0  3.5  CL  103  100  99  CO2  24  28  28   BUN  31*  30*   36*  CREATININE  1.37*  1.40*  1.50*  GLUCOSE  149*  118*  117*    Electrolytes Recent Labs     03/08/13  1827  03/09/13  0500  03/11/13  0625  CALCIUM  9.5  9.3  9.1  MG   --   2.0   --   PHOS   --   4.7*   --     Sepsis Markers Recent Labs     03/08/13  1830  03/09/13  0500  03/10/13  0347  PROCALCITON  <0.10  <0.10  <0.10    ABG Recent Labs     03/08/13  1923  PHART  7.332*  PCO2ART  45.5*  PO2ART  53.0*    Liver Enzymes Recent Labs     03/08/13  1827  AST  19  ALT  10  ALKPHOS  122*  BILITOT  0.5  ALBUMIN  3.5    Cardiac Enzymes Recent Labs     03/08/13  1826  03/11/13  0625  PROBNP  4244.0*  4207.0*    Glucose Recent Labs     03/09/13  2107  03/10/13  0803  03/10/13  1221  03/10/13  1721  03/10/13  2141  03/11/13  0840  GLUCAP  108*  93  146*  159*  160*  106*    Imaging No results found.    ASSESSMENT / PLAN:  A: Acute on chronic respiratory failure with acute pulmonary edema 2nd to cor pulmonale from OHS. Sleep study from June 2013 was negative for sleep apnea. Atelectasis. P: -continue high flow oxygen with goal SpO2 > 88% -f/u bubble study to r/o shunt >> if positive, then consider CT angiogram -bronchial hygiene -f/u CXR  A: Acute on chronic diastolic heart failure. Hyperlipidemia. P: -per cardiology -SQ heparin for DVT prevention  A: DM type II. Hypothyroidism. P: -continue glucotrol, tradjenta -SSI -continue levothyroxine  A: CKD. P: -monitor renal fx while getting lasix  A: Thrombocytopenia. P: -f/u CBC intermittently  A: Glaucoma. P: -continue travatan, alphagan  A: Anxiety. Pain control. P: -prn percocet -xanax BID  Updated family at bedside.  Coralyn Helling, MD [facility] Pulmonary/Critical Care 03/11/2013, 11:26 AM Pager:  [phone] After 3pm call: [phone]

## 2013-03-12 LAB — CBC
MCH: 28.4 pg (ref 26.0–34.0)
MCHC: 32.3 g/dL (ref 30.0–36.0)
MCV: 88.1 fL (ref 78.0–100.0)
Platelets: 118 10*3/uL — ABNORMAL LOW (ref 150–400)
RDW: 14 % (ref 11.5–15.5)
WBC: 7.2 10*3/uL (ref 4.0–10.5)

## 2013-03-12 LAB — GLUCOSE, CAPILLARY
Glucose-Capillary: 80 mg/dL (ref 70–99)
Glucose-Capillary: 101 mg/dL — ABNORMAL HIGH (ref 70–99)
Glucose-Capillary: 171 mg/dL — ABNORMAL HIGH (ref 70–99)
Glucose-Capillary: 73 mg/dL (ref 70–99)

## 2013-03-12 LAB — BASIC METABOLIC PANEL
CO2: 34 mEq/L — ABNORMAL HIGH (ref 19–32)
Calcium: 9.3 mg/dL (ref 8.4–10.5)
Creatinine, Ser: 1.2 mg/dL — ABNORMAL HIGH (ref 0.50–1.10)

## 2013-03-12 MED ORDER — POTASSIUM CHLORIDE CRYS ER 20 MEQ PO TBCR
40.0000 meq | EXTENDED_RELEASE_TABLET | Freq: Once | ORAL | Status: AC
  Administered 2013-03-12: 40 meq via ORAL
  Filled 2013-03-12: qty 2

## 2013-03-12 NOTE — Progress Notes (Signed)
Subjective:  She says she feels better  Objective:  Vital Signs in the last 24 hours: Temp:  [97.8 F (36.6 C)-98.1 F (36.7 C)] 97.9 F (36.6 C) (07/18 0425) Pulse Rate:  [75-85] 81 (07/18 0823) Resp:  [13-20] 15 (07/18 0600) BP: (92-117)/(54-73) 108/66 mmHg (07/18 0823) SpO2:  [87 %-94 %] 93 % (07/18 0600) Weight:  [103 kg (227 lb 1.2 oz)] 103 kg (227 lb 1.2 oz) (07/18 0500)  Intake/Output from previous day:  Intake/Output Summary (Last 24 hours) at 03/12/13 0843 Last data filed at 03/12/13 0600  Gross per 24 hour  Intake    480 ml  Output   2275 ml  Net  -1795 ml    Physical Exam: General appearance: alert, cooperative, no distress and morbidly obese Lungs: basilar rales Heart: regular rate and rhythm Extremities: 1-2 edema   Rate: 82  Rhythm: normal sinus rhythm  Lab Results:  Recent Labs  03/12/13 0620  WBC 7.2  HGB 14.1  PLT PENDING    Recent Labs  03/11/13 0625 03/12/13 0620  NA 138 141  K 3.5 3.3*  CL 99 97  CO2 28 34*  GLUCOSE 117* 75  BUN 36* 35*  CREATININE 1.50* 1.20*    Imaging: Imaging results have been reviewed  Cardiac Studies: 2D 03/11/13 - HPI and indications: Limited to assess R/L shunt - Procedure narrative: Transthoracic echocardiography. Image quality was adequate. Intravenous contrast (agitated saline) was administered. - Left ventricle: The cavity size was normal. Wall thickness was increased in a pattern of moderate LVH. Systolic function was normal. The estimated ejection fraction was in the range of 60% to 65%. - Right ventricle: The cavity size was moderately dilated. The moderator band was prominent. Systolic function is reduced. - Atrial septum: Inadequate microbubble contrast to r/o interatrial shunt. There are bubbles seen in the left ventricle within a few cycles,suggesting some degree of extracardiac shunt. The RV was not completely opacified, probably due to severe TR and flow reversal. A patent foramen  ovale cannot be excluded. - Tricuspid valve: Moderate to severeregurgitation. Dilated annulus. - Pulmonary arteries: PA peak pressure: 74mm Hg (S). Severe pulmonary hypertension. - Inferior vena cava: The vessel was dilated; the respirophasic diameter changes were blunted (< 50%); findings are consistent with elevated central venous pressure. - Pericardium, extracardiac: Small, posterior pericardial effusion.   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 Her wgt has finally come down a little after being virtually unchanged for the last 4 days. Consider adding Milrinone?  [name] PA-C Beeper [phone] 03/12/2013, 8:43 AM

## 2013-03-12 NOTE — Progress Notes (Signed)
Pt. Seen and examined. Agree with the NP/PA-C note as written.  Seems to be diuresing well. Weight is now 227 (down from 236 yesterday) - but had been stable before. It think we have made progress with diuresis, she is breathing better. Pulmonary pressures remain the big issue (PA systolic of 75). I wonder if she would benefit from evaluation and management by a pulmonary hypertension specialist at [facility] - will defer to Dr. Craige Cotta to investigate options for her.  I suspect transcranial doppler may demonstrate shunt as there was some left sided contrast seen fairly early.  I'm not sure what options we have if she does have intrapulmonary shunting, as far as management, other than trying to lower her pulmonary pressures.  Her creatinine is going down, suggesting that renal blood flow is okay and I'm not sure milrinone is necessary now. She is on a calcium channel blocker, which could theoretically help her pulmonary pressures.  I would continue IV bumex today and convert to po bumex tomorrow.  Chrystie Nose, MD, [facility] Attending Cardiologist [facility]

## 2013-03-12 NOTE — Progress Notes (Signed)
Pt weighed on stand up scale this am, weight down 4.5 kg since admission.  [name] P

## 2013-03-12 NOTE — Progress Notes (Signed)
PULMONARY  / CRITICAL CARE MEDICINE  Name: [name] MRN: [id number] DOB: Sep 04, 1934    ADMISSION DATE:  03/08/2013 CONSULTATION DATE:  03/08/2013  REFERRING MD :  EDP PRIMARY SERVICE:  PCCM  CHIEF COMPLAINT:  Dyspnea  BRIEF PATIENT DESCRIPTION:  78 yo female with dyspnea and lower extremity edema from cor pulmonale and acute on chronic diastolic dysfx with hx of OHS.  SIGNIFICANT EVENTS: 7/14 Admit, cardiology consult  STUDIES:  PSG 01/29/12>>AHI 2.5, PLMI 28.1. Needed 3 liters oxygen.  PFT 03/10/12>>FEV1 1.65 (100%), FEV1% 78, TLC 3.96 (95%), DLCO 53, no BD  01/12/13 Echo >> PA 83 mmHg, mod LVH, EF 65 to 70%  Doppler legs 01/12/13 >> no DVT  V/Q scan 01/12/13 >> no PE Echo bubble study 03/11/13 >> mod LVH, EF 60 to 65%, mod RV dilation, inadequate microbubble contrast, mod/severe TR, PAS 74 mmHg  ANTIBIOTICS: Cefepime 7/14 x 1 Levofloxacin 7/15 >>>7/15  SUBJECTIVE:  Breathing okay at rest.  Still winded with minimal activity.  VITAL SIGNS: Temp:  [97.4 F (36.3 C)-98.1 F (36.7 C)] 97.4 F (36.3 C) (07/18 0823) Pulse Rate:  [75-85] 81 (07/18 0823) Resp:  [13-20] 15 (07/18 0823) BP: (92-117)/(54-73) 97/61 mmHg (07/18 1003) SpO2:  [87 %-96 %] 96 % (07/18 0823) Weight:  [227 lb 1.2 oz (103 kg)] 227 lb 1.2 oz (103 kg) (07/18 0500) Oximyzer at 6 liters  I/O last 3 completed shifts: In: 600 [P.O.:600] Out: 3925 [Urine:3925]  Weight: Wt Readings from Last 3 Encounters:  03/12/13 227 lb 1.2 oz (103 kg)  02/22/13 223 lb 3.2 oz (101.243 kg)  01/19/13 220 lb (99.791 kg)    PHYSICAL EXAMINATION: General: No distress, sitting in chair Neuro: Alert, follows commands HEENT: No sinus tenderness Cardiovascular:  regular Lungs: decreased breath sounds, no wheeze, faint basilar crackles Abdomen: soft, non tender Musculoskeletal: 1+ edema Skin: no rashes  Labs: CBC Recent Labs     03/12/13  0620  WBC  7.2  HGB  14.1  HCT  43.7  PLT  118*    BMET Recent Labs      03/11/13  0625  03/12/13  0620  NA  138  141  K  3.5  3.3*  CL  99  97  CO2  28  34*  BUN  36*  35*  CREATININE  1.50*  1.20*  GLUCOSE  117*  75    Electrolytes Recent Labs     03/11/13  0625  03/12/13  0620  CALCIUM  9.1  9.3    Sepsis Markers Recent Labs     03/10/13  0347  PROCALCITON  <0.10   Cardiac Enzymes Recent Labs     03/11/13  0625  PROBNP  4207.0*    Glucose Recent Labs     03/11/13  0840  03/11/13  1159  03/11/13  1651  03/11/13  2130  03/12/13  0814  03/12/13  0911  GLUCAP  106*  126*  130*  207*  80  101*    Imaging No results found.    ASSESSMENT / PLAN:  A: Acute on chronic respiratory failure with acute pulmonary edema 2nd to cor pulmonale from OHS. Sleep study from June 2013 was negative for sleep apnea. Atelectasis. 2nd pulmonary hypertension. P: -continue high flow oxygen with goal SpO2 > 88% -?significance of bubble echo findings >> ? If she needs transcranial doppler to further assess for shunt -bronchial hygiene -f/u CXR intermittently  A: Acute on chronic diastolic heart failure. Hyperlipidemia. P: -  per cardiology -SQ heparin for DVT prevention  A: DM type II. Hypothyroidism. P: -continue glucotrol, tradjenta -SSI -continue levothyroxine  A: CKD. Hypokalemia. P: -monitor renal fx while getting lasix -replace electrolytes as needed  A: Thrombocytopenia. P: -f/u CBC intermittently  A: Glaucoma. P: -continue travatan, alphagan  A: Anxiety. Pain control. P: -prn percocet -xanax BID  Coralyn Helling, MD [facility] Pulmonary/Critical Care 03/12/2013, 11:42 AM Pager:  [phone] After 3pm call: [phone]

## 2013-03-13 ENCOUNTER — Inpatient Hospital Stay (HOSPITAL_COMMUNITY): Payer: Medicare Other

## 2013-03-13 DIAGNOSIS — R0609 Other forms of dyspnea: Secondary | ICD-10-CM

## 2013-03-13 DIAGNOSIS — R0989 Other specified symptoms and signs involving the circulatory and respiratory systems: Secondary | ICD-10-CM

## 2013-03-13 LAB — CBC
Hemoglobin: 14.8 g/dL (ref 12.0–15.0)
RBC: 5.08 MIL/uL (ref 3.87–5.11)
WBC: 6.9 10*3/uL (ref 4.0–10.5)

## 2013-03-13 LAB — BASIC METABOLIC PANEL
GFR calc Af Amer: 48 mL/min — ABNORMAL LOW (ref >90)
GFR calc non Af Amer: 42 mL/min — ABNORMAL LOW (ref >90)
Potassium: 3.2 mEq/L — ABNORMAL LOW (ref 3.5–5.1)
Sodium: 140 mEq/L (ref 135–145)

## 2013-03-13 LAB — GLUCOSE, CAPILLARY
Glucose-Capillary: 108 mg/dL — ABNORMAL HIGH (ref 70–99)
Glucose-Capillary: 156 mg/dL — ABNORMAL HIGH (ref 70–99)

## 2013-03-13 MED ORDER — POTASSIUM CHLORIDE CRYS ER 20 MEQ PO TBCR
20.0000 meq | EXTENDED_RELEASE_TABLET | Freq: Two times a day (BID) | ORAL | Status: DC
  Administered 2013-03-13 – 2013-03-16 (×7): 20 meq via ORAL
  Filled 2013-03-13 (×9): qty 1

## 2013-03-13 MED ORDER — POTASSIUM CHLORIDE CRYS ER 20 MEQ PO TBCR
EXTENDED_RELEASE_TABLET | ORAL | Status: AC
  Filled 2013-03-13: qty 2

## 2013-03-13 MED ORDER — POTASSIUM CHLORIDE CRYS ER 20 MEQ PO TBCR
40.0000 meq | EXTENDED_RELEASE_TABLET | Freq: Once | ORAL | Status: AC
  Administered 2013-03-13: 40 meq via ORAL

## 2013-03-13 NOTE — Progress Notes (Signed)
PULMONARY  / CRITICAL CARE MEDICINE  Name: [name] MRN: [id number] DOB: Jul 22, 1935    ADMISSION DATE:  03/08/2013 CONSULTATION DATE:  03/08/2013  REFERRING MD :  EDP PRIMARY SERVICE:  PCCM  CHIEF COMPLAINT:  Dyspnea  BRIEF PATIENT DESCRIPTION:  78 yo female with dyspnea and lower extremity edema from cor pulmonale and acute on chronic diastolic dysfx with hx of OHS.  SIGNIFICANT EVENTS: 7/14 Admit, cardiology consult  STUDIES:  PSG 01/29/12>>AHI 2.5, PLMI 28.1. Needed 3 liters oxygen.  PFT 03/10/12>>FEV1 1.65 (100%), FEV1% 78, TLC 3.96 (95%), DLCO 53, no BD  01/12/13 Echo >> PA 83 mmHg, mod LVH, EF 65 to 70%  Doppler legs 01/12/13 >> no DVT  V/Q scan 01/12/13 >> no PE Echo bubble study 03/11/13 >> mod LVH, EF 60 to 65%, mod RV dilation, inadequate microbubble contrast, mod/severe TR, PAS 74 mmHg  ANTIBIOTICS: Cefepime 7/14 x 1 Levofloxacin 7/15 >>>7/15  SUBJECTIVE:  Breathing okay at rest.  Still winded with minimal activity.  VITAL SIGNS: Temp:  [97.3 F (36.3 C)-98.3 F (36.8 C)] 97.3 F (36.3 C) (07/19 0800) Pulse Rate:  [82-94] 89 (07/19 0800) Resp:  [14-19] 17 (07/19 0800) BP: (86-117)/(58-73) 110/67 mmHg (07/19 0800) SpO2:  [87 %-94 %] 92 % (07/19 0800) Weight:  [101.6 kg (223 lb 15.8 oz)] 101.6 kg (223 lb 15.8 oz) (07/19 0500) Oximyzer at 6 liters  I/O last 3 completed shifts: In: 840 [P.O.:840] Out: 3600 [Urine:3600]  Weight: Wt Readings from Last 3 Encounters:  03/13/13 101.6 kg (223 lb 15.8 oz)  02/22/13 101.243 kg (223 lb 3.2 oz)  01/19/13 99.791 kg (220 lb)    PHYSICAL EXAMINATION: General: No distress, sitting in chair Neuro: Alert, follows commands HEENT: No sinus tenderness Cardiovascular:  regular Lungs: decreased breath sounds, no wheeze, faint basilar crackles Abdomen: soft, non tender Musculoskeletal: 1+ edema Skin: no rashes  Labs: CBC Recent Labs     03/12/13  0620  03/13/13  0500  WBC  7.2  6.9  HGB  14.1  14.8  HCT   43.7  45.0  PLT  118*  118*    BMET Recent Labs     03/11/13  0625  03/12/13  0620  03/13/13  0500  NA  138  141  140  K  3.5  3.3*  3.2*  CL  99  97  95*  CO2  28  34*  38*  BUN  36*  35*  34*  CREATININE  1.50*  1.20*  1.23*  GLUCOSE  117*  75  95    Electrolytes Recent Labs     03/11/13  0625  03/12/13  0620  03/13/13  0500  CALCIUM  9.1  9.3  9.5    Sepsis Markers No results found for this basename: LACTICACIDVEN, PROCALCITON, O2SATVEN,  in the last 72 hours Cardiac Enzymes Recent Labs     03/11/13  0625  PROBNP  4207.0*    Glucose Recent Labs     03/12/13  0814  03/12/13  0911  03/12/13  1231  03/12/13  1615  03/12/13  2100  03/13/13  0803  GLUCAP  80  101*  180*  171*  73  108*    Imaging Dg Chest Port 1 View  03/13/2013   *RADIOLOGY REPORT*  Clinical Data: Follow up atelectasis.  PORTABLE CHEST - 1 VIEW  Comparison: Chest radiograph performed 03/08/2013  Findings: The lungs are mildly hypoexpanded.  Mildly better defined bibasilar airspace opacities are seen.  This may reflect atelectasis or pneumonia.  Small bilateral pleural effusions are noted.  No pneumothorax identified.  The cardiomediastinal silhouette is enlarged.  Calcification is noted within the aortic arch.  No acute osseous abnormalities are seen.  IMPRESSION:  1.  Lungs mildly hypoexpanded.  Somewhat more prominent bibasilar airspace opacities may reflect atelectasis or pneumonia. 2.  Small bilateral pleural effusions seen.  Cardiomegaly noted.   Original Report Authenticated By: Tonia Ghent, M.D.      ASSESSMENT / PLAN:  A: Acute on chronic respiratory failure with acute pulmonary edema 2nd to cor pulmonale from OHS. Sleep study from June 2013 was negative for sleep apnea. Atelectasis. Secondary pulmonary hypertension. P: -continue high flow oxygen with goal SpO2 > 88% -?significance of bubble echo findings >> ? If she needs transcranial doppler to further assess for  shunt -bronchial hygiene -f/u CXR=> persist edema/ effusions  A: Acute on chronic diastolic heart failure. Hyperlipidemia. P: -per cardiology on Bumex 2mg  IV Q12h + Zaroxolyn 5mg  Bid, Coreg, Cardizem, Cozaar... -start regular K dosing -SQ heparin for DVT prevention  A: DM type II. Hypothyroidism. P: -continue glucotrol, tradjenta -SSI -continue levothyroxine  A: CKD. Hypokalemia. P: -monitor renal fx while getting lasix -replace electrolytes as needed  A: Thrombocytopenia. P: -f/u CBC intermittently  A: Glaucoma. P: -continue travatan, alphagan  A: Anxiety. Pain control. P: -prn percocet -xanax BID    Lonzo Cloud. Kriste Basque, MD [facility] 03/13/2013, 11:33 AM

## 2013-03-13 NOTE — Progress Notes (Signed)
Subjective: Feels and breathing better.  Objective: Vital signs in last 24 hours: Temp:  [97.3 F (36.3 C)-98.3 F (36.8 C)] 97.3 F (36.3 C) (07/19 0800) Pulse Rate:  [82-94] 89 (07/19 0800) Resp:  [14-19] 17 (07/19 0800) BP: (86-117)/(58-73) 110/67 mmHg (07/19 0800) SpO2:  [87 %-94 %] 92 % (07/19 0800) Weight:  [223 lb 15.8 oz (101.6 kg)] 223 lb 15.8 oz (101.6 kg) (07/19 0500) Last BM Date: 03/11/13  Intake/Output from previous day: 07/18 0701 - 07/19 0700 In: 720 [P.O.:720] Out: 2500 [Urine:2500] Intake/Output this shift: Total I/O In: -  Out: 375 [Urine:375]  Medications Current Facility-Administered Medications  Medication Dose Route Frequency Provider Last Rate Last Dose  . ALPRAZolam Prudy Feeler) tablet 0.5 mg  0.5 mg Oral BID Lonia Farber, MD   0.5 mg at 03/12/13 2210  . aspirin chewable tablet 81 mg  81 mg Oral Daily Lonia Farber, MD   81 mg at 03/12/13 1004  . brimonidine (ALPHAGAN) 0.2 % ophthalmic solution 1 drop  1 drop Both Eyes Daily Lonia Farber, MD   1 drop at 03/12/13 1003  . bumetanide (BUMEX) injection 2 mg  2 mg Intravenous Q12H Lonia Farber, MD   2 mg at 03/12/13 2209  . carvedilol (COREG) tablet 6.25 mg  6.25 mg Oral BID WC Eda Paschal Kilroy, PA-C   6.25 mg at 03/12/13 4259  . diltiazem (CARDIZEM CD) 24 hr capsule 120 mg  120 mg Oral Daily Abelino Derrick, PA-C   120 mg at 03/11/13 1058  . ezetimibe (ZETIA) tablet 10 mg  10 mg Oral QHS Lonia Farber, MD   10 mg at 03/12/13 2210  . glipiZIDE (GLUCOTROL) tablet 10 mg  10 mg Oral BID AC Konstantin Zubelevitskiy, MD   10 mg at 03/12/13 1749  . heparin injection 5,000 Units  5,000 Units Subcutaneous Q8H Konstantin Zubelevitskiy, MD      . insulin aspart (novoLOG) injection 0-15 Units  0-15 Units Subcutaneous TID WC Lonia Farber, MD   3 Units at 03/12/13 1751  . insulin aspart (novoLOG) injection 0-5 Units  0-5 Units Subcutaneous QHS Lonia Farber, MD   5 Units at 03/11/13 2157  . levothyroxine (SYNTHROID, LEVOTHROID) tablet 150 mcg  150 mcg Oral QAC breakfast Lonia Farber, MD   150 mcg at 03/12/13 5638  . linagliptin (TRADJENTA) tablet 5 mg  5 mg Oral Daily Lonia Farber, MD   5 mg at 03/12/13 1005  . losartan (COZAAR) tablet 25 mg  25 mg Oral BID Eda Paschal Kilroy, PA-C   25 mg at 03/12/13 2210  . metolazone (ZAROXOLYN) tablet 5 mg  5 mg Oral BID Lonia Farber, MD   5 mg at 03/12/13 2210  . oxyCODONE-acetaminophen (PERCOCET/ROXICET) 5-325 MG per tablet 1 tablet  1 tablet Oral BID Lonia Farber, MD   1 tablet at 03/12/13 2210  . pantoprazole (PROTONIX) EC tablet 40 mg  40 mg Oral Daily Lonia Farber, MD   40 mg at 03/12/13 1009  . simvastatin (ZOCOR) tablet 10 mg  10 mg Oral q1800 Lonia Farber, MD   10 mg at 03/12/13 1749  . Travoprost (BAK Free) (TRAVATAN) 0.004 % ophthalmic solution SOLN 1 drop  1 drop Both Eyes QHS Lonia Farber, MD   1 drop at 03/12/13 2210    PE: General appearance: alert, cooperative and no distress Lungs: Mild basilar rales.  No wheeze or rhonchi. Heart: regular rate and rhythm Extremities: 2+ LEE Pulses: 2+ and symmetric Skin: Warm and dry Neurologic:  Grossly normal  Lab Results:   Recent Labs  03/12/13 0620 03/13/13 0500  WBC 7.2 6.9  HGB 14.1 14.8  HCT 43.7 45.0  PLT 118* 118*   BMET  Recent Labs  03/11/13 0625 03/12/13 0620 03/13/13 0500  NA 138 141 140  K 3.5 3.3* 3.2*  CL 99 97 95*  CO2 28 34* 38*  GLUCOSE 117* 75 95  BUN 36* 35* 34*  CREATININE 1.50* 1.20* 1.23*  CALCIUM 9.1 9.3 9.5   PORTABLE CHEST - 1 VIEW  Comparison: Chest radiograph performed 03/08/2013  Findings: The lungs are mildly hypoexpanded. Mildly better defined bibasilar airspace opacities are seen. This may reflect atelectasis or pneumonia. Small bilateral pleural effusions are noted. No pneumothorax identified.  The  cardiomediastinal silhouette is enlarged. Calcification is noted within the aortic arch. No acute osseous abnormalities are seen.  IMPRESSION:  1. Lungs mildly hypoexpanded. Somewhat more prominent bibasilar airspace opacities may reflect atelectasis or pneumonia. 2. Small bilateral pleural effusions seen. Cardiomegaly noted.   Assessment/Plan    Principal Problem:   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 - With Acute on Chronic exaverbation Active Problems:   Chronic respiratory failure with hypoxia   DM2 (diabetes mellitus, type 2)   Unspecified hypothyroidism   Normal coronary arteries at cath 2011   Obesity, morbid (more than 100 lbs over ideal weight or BMI > 40)   Pulmonary hypertension, severe; Severe by Echo 02/1713   Hypotension   Diastolic CHF, chronic- EF 65-70% May 2014   Acute renal insufficiency   Pneumonia, organism unspecified- on Levaquin Q 48hrs  Plan:  Net fluids:  -1.8L/-7.0L.  Small bilateral pleural effusions.  On IV Bumex and Metolazone 5mg  PO.  Continue current diuretic therapy today.  Replace K+.   Meds: ASA, coreg 6.25 mg BID, Cardizem CD 120, Cozaar 25 bid, Zocor.  WT 223 and dry wt is around 213#.     LOS: 5 days    HAGER, BRYAN 03/13/2013 8:52 AM  I have seen and examined the patient along with Wilburt Finlay, PA.  I have reviewed the chart, notes and new data.  I agree with PA's note.  Key new complaints: doing a little better Key examination changes: bilateral scattered dry rales, no wheezes and no overt consolidation, no arrhythmia Key new findings / data: creat steady, improved from admission despite diuresis  PLAN: Continue diuretics - target another 8-10 lb fluid removal by time of discharge. May benefit from a home "sliding scale" furosemide Rx.  Thurmon Fair, MD, [facility] [phone] 03/13/2013, 9:26 AM

## 2013-03-14 DIAGNOSIS — J9620 Acute and chronic respiratory failure, unspecified whether with hypoxia or hypercapnia: Secondary | ICD-10-CM

## 2013-03-14 NOTE — Progress Notes (Signed)
Subjective: Feeling better  Objective: Vital signs in last 24 hours: Temp:  [97.4 F (36.3 C)-97.9 F (36.6 C)] 97.5 F (36.4 C) (07/20 0814) Pulse Rate:  [78-92] 81 (07/20 0814) Resp:  [14-22] 15 (07/20 0814) BP: (93-105)/(51-65) 96/61 mmHg (07/20 0814) SpO2:  [90 %-94 %] 93 % (07/20 0814) Last BM Date: 03/11/13  Intake/Output from previous day: 07/19 0701 - 07/20 0700 In: 740 [P.O.:740] Out: 2525 [Urine:2525] Intake/Output this shift:    Medications Current Facility-Administered Medications  Medication Dose Route Frequency Provider Last Rate Last Dose  . ALPRAZolam Prudy Feeler) tablet 0.5 mg  0.5 mg Oral BID Lonia Farber, MD   0.5 mg at 03/13/13 2015  . aspirin chewable tablet 81 mg  81 mg Oral Daily Lonia Farber, MD   81 mg at 03/13/13 0940  . brimonidine (ALPHAGAN) 0.2 % ophthalmic solution 1 drop  1 drop Both Eyes Daily Lonia Farber, MD   1 drop at 03/13/13 0940  . bumetanide (BUMEX) injection 2 mg  2 mg Intravenous Q12H Lonia Farber, MD   2 mg at 03/13/13 2015  . carvedilol (COREG) tablet 6.25 mg  6.25 mg Oral BID WC Eda Paschal Kilroy, PA-C   6.25 mg at 03/13/13 1742  . diltiazem (CARDIZEM CD) 24 hr capsule 120 mg  120 mg Oral Daily Abelino Derrick, PA-C   120 mg at 03/13/13 1610  . ezetimibe (ZETIA) tablet 10 mg  10 mg Oral QHS Lonia Farber, MD   10 mg at 03/13/13 2242  . glipiZIDE (GLUCOTROL) tablet 10 mg  10 mg Oral BID AC Lonia Farber, MD   10 mg at 03/13/13 1742  . heparin injection 5,000 Units  5,000 Units Subcutaneous Q8H Lonia Farber, MD   5,000 Units at 03/13/13 1443  . insulin aspart (novoLOG) injection 0-15 Units  0-15 Units Subcutaneous TID WC Lonia Farber, MD   5 Units at 03/13/13 1742  . insulin aspart (novoLOG) injection 0-5 Units  0-5 Units Subcutaneous QHS Lonia Farber, MD   5 Units at 03/11/13 2157  . levothyroxine (SYNTHROID, LEVOTHROID) tablet 150 mcg   150 mcg Oral QAC breakfast Lonia Farber, MD   150 mcg at 03/13/13 0839  . linagliptin (TRADJENTA) tablet 5 mg  5 mg Oral Daily Lonia Farber, MD   5 mg at 03/13/13 0939  . losartan (COZAAR) tablet 25 mg  25 mg Oral BID Abelino Derrick, PA-C   25 mg at 03/13/13 2242  . metolazone (ZAROXOLYN) tablet 5 mg  5 mg Oral BID Lonia Farber, MD   5 mg at 03/13/13 2243  . oxyCODONE-acetaminophen (PERCOCET/ROXICET) 5-325 MG per tablet 1 tablet  1 tablet Oral BID Lonia Farber, MD   1 tablet at 03/13/13 2242  . pantoprazole (PROTONIX) EC tablet 40 mg  40 mg Oral Daily Lonia Farber, MD   40 mg at 03/13/13 0939  . potassium chloride SA (K-DUR,KLOR-CON) CR tablet 20 mEq  20 mEq Oral BID Michele Mcalpine, MD   20 mEq at 03/13/13 2244  . simvastatin (ZOCOR) tablet 10 mg  10 mg Oral q1800 Lonia Farber, MD   10 mg at 03/13/13 1800  . Travoprost (BAK Free) (TRAVATAN) 0.004 % ophthalmic solution SOLN 1 drop  1 drop Both Eyes QHS Lonia Farber, MD   1 drop at 03/13/13 2243    PE: General appearance: alert, cooperative and no distress  Lungs: Mild basilar rales. No wheeze or rhonchi.  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 LEE improved.  On IV Bumex and Metolazone 5mg  PO. Continue current diuretic therapy today. Change to PO tomorrow.  Replace K+. Meds: ASA, coreg 6.25 mg BID, Cardizem CD 120, Cozaar 25 bid, Zocor. Dry wt is around 213#.  Ambulate today.      LOS: 6 days    HAGER, BRYAN 03/14/2013 8:26 AM  I have seen and examined the patient along with Wilburt Finlay, PA.  I have reviewed the chart, notes and new data.  I agree with PA's note.  Key new complaints: feels better, wants to walk Key examination changes: considerable improvement in edema ; substantial hypervolemia remains Key new findings / data: hypokalemia persists, renal function steady  PLAN: Continue IV diuretics at least one more day, replace K. Ambulate.  Thurmon Fair, MD, [facility] [phone] 03/14/2013, 9:21 AM

## 2013-03-14 NOTE — Progress Notes (Signed)
PULMONARY  / CRITICAL CARE MEDICINE  Name: [name] MRN: [id number] DOB: 02/13/1935    ADMISSION DATE:  03/08/2013 CONSULTATION DATE:  03/08/2013  REFERRING MD :  EDP PRIMARY SERVICE:  PCCM  CHIEF COMPLAINT:  Dyspnea  BRIEF PATIENT DESCRIPTION:  78 yo female with dyspnea and lower extremity edema from cor pulmonale and acute on chronic diastolic dysfx with hx of OHS.  SIGNIFICANT EVENTS: 7/14 Admit, cardiology consult  STUDIES:  PSG 01/29/12>>AHI 2.5, PLMI 28.1. Needed 3 liters oxygen.  PFT 03/10/12>>FEV1 1.65 (100%), FEV1% 78, TLC 3.96 (95%), DLCO 53, no BD  01/12/13 Echo >> PA 83 mmHg, mod LVH, EF 65 to 70%  Doppler legs 01/12/13 >> no DVT  V/Q scan 01/12/13 >> no PE Echo bubble study 03/11/13 >> mod LVH, EF 60 to 65%, mod RV dilation, inadequate microbubble contrast, mod/severe TR, PAS 74 mmHg  ANTIBIOTICS: Cefepime 7/14 x 1 Levofloxacin 7/15 >>>7/15  SUBJECTIVE:  Breathing okay at rest.  Still winded with minimal activity.  VITAL SIGNS: Temp:  [97.4 F (36.3 C)-97.9 F (36.6 C)] 97.5 F (36.4 C) (07/20 0814) Pulse Rate:  [78-92] 81 (07/20 0814) Resp:  [14-22] 15 (07/20 0814) BP: (93-105)/(51-65) 96/61 mmHg (07/20 0814) SpO2:  [90 %-94 %] 93 % (07/20 0814) Oximyzer at 6 liters  I/O last 3 completed shifts: In: 740 [P.O.:740] Out: 4075 [Urine:4075]  Weight: Wt Readings from Last 3 Encounters:  03/13/13 101.6 kg (223 lb 15.8 oz)  02/22/13 101.243 kg (223 lb 3.2 oz)  01/19/13 99.791 kg (220 lb)    PHYSICAL EXAMINATION: General: No distress, sitting in chair Neuro: Alert, follows commands HEENT: No sinus tenderness Cardiovascular:  regular Lungs: decreased breath sounds, no wheeze, faint basilar crackles Abdomen: soft, non tender Musculoskeletal: 1+ edema Skin: no rashes  Labs: CBC Recent Labs     03/12/13  0620  03/13/13  0500  WBC  7.2  6.9  HGB  14.1  14.8  HCT  43.7  45.0  PLT  118*  118*    BMET Recent Labs     03/12/13  0620   03/13/13  0500  NA  141  140  K  3.3*  3.2*  CL  97  95*  CO2  34*  38*  BUN  35*  34*  CREATININE  1.20*  1.23*  GLUCOSE  75  95    Electrolytes Recent Labs     03/12/13  0620  03/13/13  0500  CALCIUM  9.3  9.5    Sepsis Markers No results found for this basename: LACTICACIDVEN, PROCALCITON, O2SATVEN,  in the last 72 hours Cardiac Enzymes No results found for this basename: TROPONINI, PROBNP,  in the last 72 hours  Glucose Recent Labs     03/12/13  1615  03/12/13  2100  03/13/13  0803  03/13/13  1140  03/13/13  1626  03/13/13  2107  GLUCAP  171*  73  108*  156*  226*  160*    Imaging Dg Chest Port 1 View  03/13/2013   *RADIOLOGY REPORT*  Clinical Data: Follow up atelectasis.  PORTABLE CHEST - 1 VIEW  Comparison: Chest radiograph performed 03/08/2013  Findings: The lungs are mildly hypoexpanded.  Mildly better defined bibasilar airspace opacities are seen.  This may reflect atelectasis or pneumonia.  Small bilateral pleural effusions are noted.  No pneumothorax identified.  The cardiomediastinal silhouette is enlarged.  Calcification is noted within the aortic arch.  No acute osseous abnormalities are seen.  IMPRESSION:  1.  Lungs mildly hypoexpanded.  Somewhat more prominent bibasilar airspace opacities may reflect atelectasis or pneumonia. 2.  Small bilateral pleural effusions seen.  Cardiomegaly noted.   Original Report Authenticated By: Tonia Ghent, M.D.      ASSESSMENT / PLAN:  A: Acute on chronic respiratory failure with acute pulmonary edema 2nd to cor pulmonale from OHS. Sleep study from June 2013 was negative for sleep apnea. Atelectasis. Secondary pulmonary hypertension. P: -continue high flow oxygen with goal SpO2 > 88% -?significance of bubble echo findings >> ? If she needs transcranial doppler to further assess for shunt -bronchial hygiene -f/u CXR=> persist edema/ effusions  A: Acute on chronic diastolic heart failure. Hyperlipidemia. P: -per  cardiology neg I/O on Bumex 2mg  IV Q12h + Zaroxolyn 5mg  Bid, Coreg, Cardizem, Cozaar... -start regular K dosing -SQ heparin for DVT prevention  A: DM type II. Hypothyroidism. P: -continue glucotrol, tradjenta -SSI -continue levothyroxine  A: CKD. Hypokalemia. P: -monitor renal fx while getting lasix -replace electrolytes as needed  A: Thrombocytopenia. P: -f/u CBC intermittently  A: Glaucoma. P: -continue travatan, alphagan  A: Anxiety. Pain control. P: -prn percocet -xanax BID    Lonzo Cloud. Kriste Basque, MD [facility] 03/14/2013, 9:27 AM

## 2013-03-15 ENCOUNTER — Inpatient Hospital Stay (HOSPITAL_COMMUNITY): Payer: Medicare Other

## 2013-03-15 DIAGNOSIS — N289 Disorder of kidney and ureter, unspecified: Secondary | ICD-10-CM

## 2013-03-15 LAB — CBC
HCT: 45 % (ref 36.0–46.0)
MCV: 88.8 fL (ref 78.0–100.0)
Platelets: 100 10*3/uL — ABNORMAL LOW (ref 150–400)
RBC: 5.07 MIL/uL (ref 3.87–5.11)
WBC: 5.5 10*3/uL (ref 4.0–10.5)

## 2013-03-15 LAB — GLUCOSE, CAPILLARY
Glucose-Capillary: 84 mg/dL (ref 70–99)
Glucose-Capillary: 196 mg/dL — ABNORMAL HIGH (ref 70–99)

## 2013-03-15 LAB — COMPREHENSIVE METABOLIC PANEL
ALT: 11 U/L (ref 0–35)
Alkaline Phosphatase: 111 U/L (ref 39–117)
CO2: 40 mEq/L (ref 19–32)
GFR calc Af Amer: 51 mL/min — ABNORMAL LOW (ref >90)
GFR calc non Af Amer: 44 mL/min — ABNORMAL LOW (ref >90)
Glucose, Bld: 103 mg/dL — ABNORMAL HIGH (ref 70–99)
Potassium: 3.4 mEq/L — ABNORMAL LOW (ref 3.5–5.1)
Sodium: 141 mEq/L (ref 135–145)

## 2013-03-15 NOTE — Progress Notes (Signed)
Subjective: No complaints.  Objective: Vital signs in last 24 hours: Temp:  [97.4 F (36.3 C)-98.7 F (37.1 C)] 97.4 F (36.3 C) (07/21 0808) Pulse Rate:  [71-91] 85 (07/21 0808) Resp:  [15-21] 17 (07/21 0547) BP: (91-113)/(55-71) 105/56 mmHg (07/21 0808) SpO2:  [90 %-94 %] 92 % (07/21 0808) Weight:  [218 lb 11.1 oz (99.2 kg)] 218 lb 11.1 oz (99.2 kg) (07/21 0258) Last BM Date: 03/11/13  Intake/Output from previous day: 07/20 0701 - 07/21 0700 In: -  Out: 1026 [Urine:1025; Stool:1] Intake/Output this shift:    Medications Current Facility-Administered Medications  Medication Dose Route Frequency Provider Last Rate Last Dose  . ALPRAZolam Prudy Feeler) tablet 0.5 mg  0.5 mg Oral BID Lonia Farber, MD   0.5 mg at 03/14/13 2210  . aspirin chewable tablet 81 mg  81 mg Oral Daily Lonia Farber, MD   81 mg at 03/14/13 1044  . brimonidine (ALPHAGAN) 0.2 % ophthalmic solution 1 drop  1 drop Both Eyes Daily Lonia Farber, MD   1 drop at 03/14/13 1252  . bumetanide (BUMEX) injection 2 mg  2 mg Intravenous Q12H Lonia Farber, MD   2 mg at 03/14/13 2209  . carvedilol (COREG) tablet 6.25 mg  6.25 mg Oral BID WC Eda Paschal Kilroy, PA-C   6.25 mg at 03/14/13 1610  . diltiazem (CARDIZEM CD) 24 hr capsule 120 mg  120 mg Oral Daily Abelino Derrick, PA-C   120 mg at 03/14/13 1044  . ezetimibe (ZETIA) tablet 10 mg  10 mg Oral QHS Lonia Farber, MD   10 mg at 03/14/13 2209  . glipiZIDE (GLUCOTROL) tablet 10 mg  10 mg Oral BID AC Lonia Farber, MD   10 mg at 03/14/13 1853  . heparin injection 5,000 Units  5,000 Units Subcutaneous Q8H Lonia Farber, MD   5,000 Units at 03/13/13 1443  . insulin aspart (novoLOG) injection 0-15 Units  0-15 Units Subcutaneous TID WC Konstantin Zubelevitskiy, MD      . insulin aspart (novoLOG) injection 0-5 Units  0-5 Units Subcutaneous QHS Lonia Farber, MD   5 Units at 03/11/13 2157  .  levothyroxine (SYNTHROID, LEVOTHROID) tablet 150 mcg  150 mcg Oral QAC breakfast Lonia Farber, MD   150 mcg at 03/14/13 1044  . linagliptin (TRADJENTA) tablet 5 mg  5 mg Oral Daily Lonia Farber, MD   5 mg at 03/14/13 1043  . losartan (COZAAR) tablet 25 mg  25 mg Oral BID Abelino Derrick, PA-C   25 mg at 03/14/13 2209  . metolazone (ZAROXOLYN) tablet 5 mg  5 mg Oral BID Lonia Farber, MD   5 mg at 03/14/13 2210  . oxyCODONE-acetaminophen (PERCOCET/ROXICET) 5-325 MG per tablet 1 tablet  1 tablet Oral BID Lonia Farber, MD   1 tablet at 03/14/13 2210  . pantoprazole (PROTONIX) EC tablet 40 mg  40 mg Oral Daily Lonia Farber, MD   40 mg at 03/14/13 1044  . potassium chloride SA (K-DUR,KLOR-CON) CR tablet 20 mEq  20 mEq Oral BID Michele Mcalpine, MD   20 mEq at 03/14/13 2210  . simvastatin (ZOCOR) tablet 10 mg  10 mg Oral q1800 Lonia Farber, MD   10 mg at 03/14/13 1857  . Travoprost (BAK Free) (TRAVATAN) 0.004 % ophthalmic solution SOLN 1 drop  1 drop Both Eyes QHS Lonia Farber, MD   1 drop at 03/14/13 2210    PE: General appearance: alert, cooperative and no distress  Lungs: Mild basilar rales. No wheeze  or rhonchi.  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 LEE improved. On IV Bumex and Metolazone 5mg  PO.  I think we can continue current IV diuretic therapy today.  Her legs are still quite edematous.  SCr improved slightly.  K+ better but still not WNL.  On scheduled replacement as of yesterday.  Meds: ASA, coreg 6.25 mg BID, Cardizem CD 120, Cozaar 25 bid, Zocor. Dry wt is around 213#.  Wt is now 218#.    She needs to ambulate today.     LOS: 7 days    HAGER, BRYAN 03/15/2013 8:13 AM  I have seen and examined the patient along with Wilburt Finlay, PA.  I have reviewed the chart, notes and new data.  I agree with PA's note.  Good progress but not yet euvolemic. Renal function still permits aggressive diuresis, continue same meds. Hopefully will be ready to switch to PO diuretics in AM.  PLAN: At discharge, she would benefit from a "sliding scale" weight based diuretic prescription.  Thurmon Fair, MD, [facility] [address] [facility] [phone] 03/15/2013, 10:08 AM

## 2013-03-15 NOTE — Progress Notes (Signed)
Pt ambulated outside in the hallway, pt ambulated about 15 feet before is needed to go back to her room. Pt Oxygen level decreased to 87 from 94 on 6 liters high low oxygen. Adria Dill RN

## 2013-03-15 NOTE — Progress Notes (Signed)
PULMONARY  / CRITICAL CARE MEDICINE  Name: [name] MRN: [id number] DOB: 1934-11-09    ADMISSION DATE:  03/08/2013 CONSULTATION DATE:  03/08/2013  REFERRING MD :  EDP PRIMARY SERVICE:  PCCM  CHIEF COMPLAINT:  Dyspnea  BRIEF PATIENT DESCRIPTION:  78 yo female with dyspnea and lower extremity edema from cor pulmonale and acute on chronic diastolic dysfx with hx of OHS.  SIGNIFICANT EVENTS: 7/14 Admit, cardiology consult  STUDIES:  PSG 01/29/12>>AHI 2.5, PLMI 28.1. Needed 3 liters oxygen.  PFT 03/10/12>>FEV1 1.65 (100%), FEV1% 78, TLC 3.96 (95%), DLCO 53, no BD  01/12/13 Echo >> PA 83 mmHg, mod LVH, EF 65 to 70%  Doppler legs 01/12/13 >> no DVT  V/Q scan 01/12/13 >> no PE Echo bubble study 03/11/13 >> mod LVH, EF 60 to 65%, mod RV dilation, inadequate microbubble contrast, mod/severe TR, PAS 74 mmHg  ANTIBIOTICS: Cefepime 7/14 x 1 Levofloxacin 7/15 >>>7/15  SUBJECTIVE:  Breathing okay at rest.  Still winded with minimal activity.  VITAL SIGNS: Temp:  [97.2 F (36.2 C)-98.7 F (37.1 C)] 97.2 F (36.2 C) (07/21 1233) Pulse Rate:  [71-88] 87 (07/21 1233) Resp:  [16-21] 19 (07/21 1233) BP: (91-113)/(55-71) 103/61 mmHg (07/21 1233) SpO2:  [84 %-94 %] 84 % (07/21 1233) Weight:  [99.2 kg (218 lb 11.1 oz)] 99.2 kg (218 lb 11.1 oz) (07/21 0258) Oximyzer at 6 liters  I/O last 3 completed shifts: In: -  Out: 2026 [Urine:2025; Stool:1]  Weight: Wt Readings from Last 3 Encounters:  03/15/13 99.2 kg (218 lb 11.1 oz)  02/22/13 101.243 kg (223 lb 3.2 oz)  01/19/13 99.791 kg (220 lb)    PHYSICAL EXAMINATION: General: No distress, sitting in chair Neuro: Alert, follows commands HEENT: No sinus tenderness Cardiovascular:  regular Lungs: decreased breath sounds, no wheeze, faint basilar crackles Abdomen: soft, non tender Musculoskeletal: 1+ edema Skin: no rashes  Labs: CBC Recent Labs     03/13/13  0500  03/15/13  0500  WBC  6.9  5.5  HGB  14.8  14.4  HCT  45.0   45.0  PLT  118*  100*    BMET Recent Labs     03/13/13  0500  03/15/13  0500  NA  140  141  K  3.2*  3.4*  CL  95*  92*  CO2  38*  40*  BUN  34*  35*  CREATININE  1.23*  1.18*  GLUCOSE  95  103*    Electrolytes Recent Labs     03/13/13  0500  03/15/13  0500  CALCIUM  9.5  9.7    Sepsis Markers No results found for this basename: LACTICACIDVEN, PROCALCITON, O2SATVEN,  in the last 72 hours Cardiac Enzymes Recent Labs     03/15/13  0500  PROBNP  3712.0*    Glucose Recent Labs     03/14/13  0826  03/14/13  1206  03/14/13  1729  03/14/13  2143  03/15/13  0757  03/15/13  1235  GLUCAP  87  169*  135*  133*  84  148*    Imaging Dg Chest Port 1 View  03/15/2013   *RADIOLOGY REPORT*  Clinical Data: Pulmonary infiltrates.  PORTABLE CHEST - 1 VIEW  Comparison: 03/13/2013.  Findings: The cardiac silhouette, mediastinal and hilar contours are stable.  There are persistent bilateral infiltrates with slight improved aeration on the left.  Suspect small effusions.  No pneumothorax.  IMPRESSION: Persistent bilateral infiltrates with slight improved left basilar aeration. Small bilateral  pleural effusions.   Original Report Authenticated By: Rudie Meyer, M.D.      ASSESSMENT / PLAN:  A: Acute on chronic respiratory failure with acute pulmonary edema 2nd to cor pulmonale from OHS. Sleep study from June 2013 was negative for sleep apnea. Atelectasis. Secondary pulmonary hypertension. P: -continue high flow oxygen with goal SpO2 > 88% -?significance of bubble echo findings >> ? If she needs transcranial doppler to further assess for shunt -bronchial hygiene -f/u CXR=> persist edema/ effusions  A: Acute on chronic diastolic heart failure. Hyperlipidemia. P: -per cardiology neg I/O on Bumex 2mg  IV Q12h + Zaroxolyn 5mg  Bid, Coreg, Cardizem, Cozaar... -start regular K dosing -SQ heparin for DVT prevention  A: DM type II. Hypothyroidism. P: -continue glucotrol,  tradjenta -SSI -continue levothyroxine  A: CKD. Hypokalemia. P: -monitor renal fx while getting lasix -replace electrolytes as needed  A: Thrombocytopenia. P: -f/u CBC intermittently  A: Glaucoma. P: -continue travatan, alphagan  A: Anxiety. Pain control. P: -prn percocet -xanax BID  Will keep in SDU overnight, transfer care to [facility] for 7/22, PCCM will sign off, please call back if needed.  Alyson Reedy, M.D. [facility] Pulmonary/Critical Care Medicine. Pager: [phone]. After hours pager: [phone].

## 2013-03-16 LAB — MAGNESIUM: Magnesium: 1.8 mg/dL (ref 1.5–2.5)

## 2013-03-16 LAB — GLUCOSE, CAPILLARY: Glucose-Capillary: 102 mg/dL — ABNORMAL HIGH (ref 70–99)

## 2013-03-16 LAB — BASIC METABOLIC PANEL
Chloride: 89 mEq/L — ABNORMAL LOW (ref 96–112)
Creatinine, Ser: 1.08 mg/dL (ref 0.50–1.10)
GFR calc Af Amer: 57 mL/min — ABNORMAL LOW (ref >90)
GFR calc non Af Amer: 49 mL/min — ABNORMAL LOW (ref >90)
Potassium: 3.4 mEq/L — ABNORMAL LOW (ref 3.5–5.1)

## 2013-03-16 MED ORDER — POLYETHYLENE GLYCOL 3350 17 G PO PACK
17.0000 g | PACK | Freq: Every day | ORAL | Status: DC
  Administered 2013-03-16 – 2013-03-17 (×2): 17 g via ORAL
  Filled 2013-03-16 (×2): qty 1

## 2013-03-16 MED ORDER — BUMETANIDE 2 MG PO TABS
3.0000 mg | ORAL_TABLET | Freq: Every day | ORAL | Status: DC
  Administered 2013-03-17: 3 mg via ORAL
  Filled 2013-03-16 (×2): qty 1

## 2013-03-16 MED ORDER — PROMETHAZINE HCL 25 MG/ML IJ SOLN
INTRAMUSCULAR | Status: AC
  Filled 2013-03-16: qty 1

## 2013-03-16 NOTE — Progress Notes (Signed)
[facility] Progress Note [place] TEAM 1 - Stepdown/ICU TEAM   LAMIYA NAAS JXB:[id number] DOB: March 17, 1935 DOA: 03/08/2013 PCP: Cassell Smiles., MD  Brief narrative: This 78 year old female with morbid obesity, obstructive sleep apnea, obesity hypoventilation syndrome, chronic hypoxemic respiratory failure on 6-8 L of oxygen at home, chronic diastolic heart failure pulmonary hypertension and pedal edema. Patient presented with increased shortness of breath and weight gain. She was admitted to the ICU and was treated for fluid overload -a cardiology consult was requested to assist with diuresis. She has since diuresed about 11 L  She was transferred over to the [facility] on 7/22.  Assessment/Plan: Principal Problem:   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 - With Acute on Chronic exacerbation/Pulmonary hypertension, severe; Severe by Echo 02/1713   Diastolic CHF, chronic- EF 65-70% May 2014 -Being managed by cardiology-transitioned to oral diuretics today with plans to discharge home tomorrow  Active Problems:   Chronic respiratory failure with hypoxia -On 6 L of O2 at baseline which she is on currently    DM2 (diabetes mellitus, type 2) -Controlled on glipizide and sliding scale insulin    Unspecified hypothyroidism -Continue Synthroid    Obesity, morbid (more than 100 lbs over ideal weight or BMI > 40)  Hypokalemia On twice a day potassium replacement   Code Status: Full code Family Communication: None Disposition Plan: Watch in step down unit due to  Consultants: Cardio  Procedures: None  Antibiotics: Vancomycin 7/14 Cefepime 7/14 Levaquin 7/14-7/16  DVT prophylaxis: Heparin  HPI/Subjective: Patient sitting up in a chair-states that she feels great and is almost ready to go home   Objective: Blood pressure 80/40, pulse 80, temperature 98.2 F (36.8 C), temperature source Oral, resp. rate 18, height 5\' 1"  (1.549 m),  weight 99.2 kg (218 lb 11.1 oz), SpO2 85.00%.  Intake/Output Summary (Last 24 hours) at 03/16/13 1351 Last data filed at 03/16/13 1233  Gross per 24 hour  Intake      0 ml  Output   1775 ml  Net  -1775 ml     Exam: General: No acute respiratory distress Lungs: Clear to auscultation bilaterally without wheezes or crackles Cardiovascular: Regular rate and rhythm without murmur gallop or rub normal S1 and S2 Abdomen: Nontender, nondistended, soft, bowel sounds positive, no rebound, no ascites, no appreciable mass Extremities: No significant cyanosis, clubbing, or edema bilateral lower extremities  Data Reviewed: Basic Metabolic Panel:  Recent Labs Lab 03/11/13 0625 03/12/13 0620 03/13/13 0500 03/15/13 0500 03/16/13 0600  NA 138 141 140 141 138  K 3.5 3.3* 3.2* 3.4* 3.4*  CL 99 97 95* 92* 89*  CO2 28 34* 38* 40* 40*  GLUCOSE 117* 75 95 103* 99  BUN 36* 35* 34* 35* 38*  CREATININE 1.50* 1.20* 1.23* 1.18* 1.08  CALCIUM 9.1 9.3 9.5 9.7 9.9  MG  --   --   --   --  1.8  PHOS  --   --   --   --  4.1   Liver Function Tests:  Recent Labs Lab 03/15/13 0500  AST 29  ALT 11  ALKPHOS 111  BILITOT 0.8  PROT 5.9*  ALBUMIN 3.4*   No results found for this basename: LIPASE, AMYLASE,  in the last 168 hours No results found for this basename: AMMONIA,  in the last 168 hours CBC:  Recent Labs Lab 03/12/13 0620 03/13/13 0500 03/15/13 0500  WBC 7.2 6.9 5.5  HGB 14.1 14.8 14.4  HCT 43.7  45.0 45.0  MCV 88.1 88.6 88.8  PLT 118* 118* 100*   Cardiac Enzymes: No results found for this basename: CKTOTAL, CKMB, CKMBINDEX, TROPONINI,  in the last 168 hours BNP (last 3 results)  Recent Labs  03/08/13 1826 03/11/13 0625 03/15/13 0500  PROBNP 4244.0* 4207.0* 3712.0*   CBG:  Recent Labs Lab 03/15/13 0757 03/15/13 1235 03/15/13 1623 03/15/13 2033 03/16/13 0752  GLUCAP 84 148* 196* 180* 90    Recent Results (from the past 240 hour(s))  MRSA PCR SCREENING     Status:  None   Collection Time    03/08/13 11:20 PM      Result Value Range Status   MRSA by PCR NEGATIVE  NEGATIVE Final   Comment:            The GeneXpert MRSA Assay (FDA     approved for NASAL specimens     only), is one component of a     comprehensive MRSA colonization     surveillance program. It is not     intended to diagnose MRSA     infection nor to guide or     monitor treatment for     MRSA infections.     Studies:  Recent x-ray studies have been reviewed in detail by the Attending Physician  Scheduled Meds:  Scheduled Meds: . ALPRAZolam  0.5 mg Oral BID  . aspirin  81 mg Oral Daily  . brimonidine  1 drop Both Eyes Daily  . bumetanide  3 mg Oral Daily  . carvedilol  6.25 mg Oral BID WC  . diltiazem  120 mg Oral Daily  . ezetimibe  10 mg Oral QHS  . glipiZIDE  10 mg Oral BID AC  . heparin subcutaneous  5,000 Units Subcutaneous Q8H  . insulin aspart  0-15 Units Subcutaneous TID WC  . insulin aspart  0-5 Units Subcutaneous QHS  . levothyroxine  150 mcg Oral QAC breakfast  . linagliptin  5 mg Oral Daily  . losartan  25 mg Oral BID  . metolazone  5 mg Oral BID  . oxyCODONE-acetaminophen  1 tablet Oral BID  . pantoprazole  40 mg Oral Daily  . potassium chloride  20 mEq Oral BID  . simvastatin  10 mg Oral q1800  . Travoprost (BAK Free)  1 drop Both Eyes QHS   Continuous Infusions:   Time spent on care of this patient: 35 minute   [facility]  [facility] Office  [phone] Pager - Text Page per Loretha Stapler as per below:  On-Call/Text Page:      [email or web address]      password [email or web address]  If 7PM-7AM, please contact night-coverage [email or web address] Password [email or web address] 03/16/2013, 1:51 PM   LOS: 8 days

## 2013-03-16 NOTE — Progress Notes (Signed)
Hypotensive but tolerating it well. Stop Bumex, Zaroxolyn, and Cozaar for now. Parameters placed on Coreg.   [name] PA-C 03/16/2013 5:00 PM

## 2013-03-16 NOTE — Progress Notes (Signed)
[facility]  DAILY PROGRESS NOTE   Subjective:  Feeling better, asks about going home. Overall negative 11L since admission, weight down >18 lb  Objective:  Temp:  [97.2 F (36.2 C)-98 F (36.7 C)] 97.6 F (36.4 C) (07/22 0439) Pulse Rate:  [75-87] 80 (07/22 0439) Resp:  [17-20] 17 (07/22 0439) BP: (93-106)/(53-64) 104/64 mmHg (07/22 0439) SpO2:  [84 %-94 %] 94 % (07/22 0439) Weight change:   Intake/Output from previous day: 07/21 0701 - 07/22 0700 In: -  Out: 1725 [Urine:1725]  Intake/Output from this shift:    Medications: Current Facility-Administered Medications  Medication Dose Route Frequency Provider Last Rate Last Dose  . ALPRAZolam Prudy Feeler) tablet 0.5 mg  0.5 mg Oral BID Lonia Farber, MD   0.5 mg at 03/15/13 2259  . aspirin chewable tablet 81 mg  81 mg Oral Daily Lonia Farber, MD   81 mg at 03/15/13 0925  . brimonidine (ALPHAGAN) 0.2 % ophthalmic solution 1 drop  1 drop Both Eyes Daily Lonia Farber, MD   1 drop at 03/15/13 0926  . bumetanide (BUMEX) tablet 3 mg  3 mg Oral Daily Wallie Lagrand, MD      . carvedilol (COREG) tablet 6.25 mg  6.25 mg Oral BID WC Eda Paschal Kilroy, PA-C   6.25 mg at 03/15/13 1807  . diltiazem (CARDIZEM CD) 24 hr capsule 120 mg  120 mg Oral Daily Abelino Derrick, PA-C   120 mg at 03/15/13 1158  . ezetimibe (ZETIA) tablet 10 mg  10 mg Oral QHS Lonia Farber, MD   10 mg at 03/15/13 2259  . glipiZIDE (GLUCOTROL) tablet 10 mg  10 mg Oral BID AC Lonia Farber, MD   10 mg at 03/15/13 1807  . heparin injection 5,000 Units  5,000 Units Subcutaneous Q8H Lonia Farber, MD   5,000 Units at 03/13/13 1443  . insulin aspart (novoLOG) injection 0-15 Units  0-15 Units Subcutaneous TID WC Lonia Farber, MD   3 Units at 03/15/13 1808  . insulin aspart (novoLOG) injection 0-5 Units  0-5 Units Subcutaneous QHS Lonia Farber, MD   5 Units at 03/11/13  2157  . levothyroxine (SYNTHROID, LEVOTHROID) tablet 150 mcg  150 mcg Oral QAC breakfast Lonia Farber, MD   150 mcg at 03/15/13 0925  . linagliptin (TRADJENTA) tablet 5 mg  5 mg Oral Daily Lonia Farber, MD   5 mg at 03/15/13 0925  . losartan (COZAAR) tablet 25 mg  25 mg Oral BID Abelino Derrick, PA-C   25 mg at 03/15/13 1610  . metolazone (ZAROXOLYN) tablet 5 mg  5 mg Oral BID Lonia Farber, MD   5 mg at 03/15/13 2300  . oxyCODONE-acetaminophen (PERCOCET/ROXICET) 5-325 MG per tablet 1 tablet  1 tablet Oral BID Lonia Farber, MD   1 tablet at 03/15/13 2259  . pantoprazole (PROTONIX) EC tablet 40 mg  40 mg Oral Daily Lonia Farber, MD   40 mg at 03/15/13 0926  . potassium chloride SA (K-DUR,KLOR-CON) CR tablet 20 mEq  20 mEq Oral BID Michele Mcalpine, MD   20 mEq at 03/15/13 2300  . simvastatin (ZOCOR) tablet 10 mg  10 mg Oral q1800 Lonia Farber, MD   10 mg at 03/15/13 1807  . Travoprost (BAK Free) (TRAVATAN) 0.004 % ophthalmic solution SOLN 1 drop  1 drop Both Eyes QHS Lonia Farber, MD   1 drop at 03/15/13 2301    Physical Exam:  General: Alert, oriented x3, no distress Head: no evidence  of trauma, PERRL, EOMI, no exophtalmos or lid lag, no myxedema, no xanthelasma; normal ears, nose and oropharynx Neck: 5-6 cm jugular venous pulsations and hepatojugular reflux; brisk carotid pulses without delay and no carotid bruits Chest: clear to auscultation, no signs of consolidation by percussion or palpation, normal fremitus, symmetrical and full respiratory excursions Cardiovascular: normal position and quality of the apical impulse, regular rhythm, normal first and second heart sounds, no murmurs, rubs or gallops Abdomen: no tenderness or distention, no masses by palpation, no abnormal pulsatility or arterial bruits, normal bowel sounds, no hepatosplenomegaly Extremities: no clubbing, cyanosis; 2+ edema now limited to feet and  ankles; 2+ radial, ulnar and brachial pulses bilaterally; 2+ right femoral, posterior tibial and dorsalis pedis pulses; 2+ left femoral, posterior tibial and dorsalis pedis pulses; no subclavian or femoral bruits Neurological: grossly nonfocal   Lab Results: Results for orders placed during the hospital encounter of 03/08/13 (from the past 48 hour(s))  GLUCOSE, CAPILLARY     Status: None   Collection Time    03/14/13  8:26 AM      Result Value Range   Glucose-Capillary 87  70 - 99 mg/dL  GLUCOSE, CAPILLARY     Status: Abnormal   Collection Time    03/14/13 12:06 PM      Result Value Range   Glucose-Capillary 169 (*) 70 - 99 mg/dL  GLUCOSE, CAPILLARY     Status: Abnormal   Collection Time    03/14/13  5:29 PM      Result Value Range   Glucose-Capillary 135 (*) 70 - 99 mg/dL  GLUCOSE, CAPILLARY     Status: Abnormal   Collection Time    03/14/13  9:43 PM      Result Value Range   Glucose-Capillary 133 (*) 70 - 99 mg/dL  CBC     Status: Abnormal   Collection Time    03/15/13  5:00 AM      Result Value Range   WBC 5.5  4.0 - 10.5 K/uL   RBC 5.07  3.87 - 5.11 MIL/uL   Hemoglobin 14.4  12.0 - 15.0 g/dL   HCT 09.8  11.9 - 14.7 %   MCV 88.8  78.0 - 100.0 fL   MCH 28.4  26.0 - 34.0 pg   MCHC 32.0  30.0 - 36.0 g/dL   RDW 82.9  56.2 - 13.0 %   Platelets 100 (*) 150 - 400 K/uL   Comment: CONSISTENT WITH PREVIOUS RESULT  COMPREHENSIVE METABOLIC PANEL     Status: Abnormal   Collection Time    03/15/13  5:00 AM      Result Value Range   Sodium 141  135 - 145 mEq/L   Potassium 3.4 (*) 3.5 - 5.1 mEq/L   Chloride 92 (*) 96 - 112 mEq/L   CO2 40 (*) 19 - 32 mEq/L   Comment: CRITICAL RESULT CALLED TO, READ BACK BY AND VERIFIED WITH:     R OUSTERMAN,RN [phone] WILDERK   Glucose, Bld 103 (*) 70 - 99 mg/dL   BUN 35 (*) 6 - 23 mg/dL   Creatinine, Ser 6.96 (*) 0.50 - 1.10 mg/dL   Calcium 9.7  8.4 - 29.5 mg/dL   Total Protein 5.9 (*) 6.0 - 8.3 g/dL   Albumin 3.4 (*) 3.5 - 5.2 g/dL    AST 29  0 - 37 U/L   ALT 11  0 - 35 U/L   Alkaline Phosphatase 111  39 - 117 U/L   Total  Bilirubin 0.8  0.3 - 1.2 mg/dL   GFR calc non Af Amer 44 (*) >90 mL/min   GFR calc Af Amer 51 (*) >90 mL/min   Comment:            The eGFR has been calculated     using the CKD EPI equation.     This calculation has not been     validated in all clinical     situations.     eGFR's persistently     <90 mL/min signify     possible Chronic Kidney Disease.  PRO B NATRIURETIC PEPTIDE     Status: Abnormal   Collection Time    03/15/13  5:00 AM      Result Value Range   Pro B Natriuretic peptide (BNP) 3712.0 (*) 0 - 450 pg/mL  GLUCOSE, CAPILLARY     Status: None   Collection Time    03/15/13  7:57 AM      Result Value Range   Glucose-Capillary 84  70 - 99 mg/dL  GLUCOSE, CAPILLARY     Status: Abnormal   Collection Time    03/15/13 12:35 PM      Result Value Range   Glucose-Capillary 148 (*) 70 - 99 mg/dL   Comment 1 Notify RN    GLUCOSE, CAPILLARY     Status: Abnormal   Collection Time    03/15/13  4:23 PM      Result Value Range   Glucose-Capillary 196 (*) 70 - 99 mg/dL   Comment 1 Notify RN    GLUCOSE, CAPILLARY     Status: Abnormal   Collection Time    03/15/13  8:33 PM      Result Value Range   Glucose-Capillary 180 (*) 70 - 99 mg/dL   Comment 1 Notify RN    BASIC METABOLIC PANEL     Status: Abnormal   Collection Time    03/16/13  6:00 AM      Result Value Range   Sodium 138  135 - 145 mEq/L   Potassium 3.4 (*) 3.5 - 5.1 mEq/L   Chloride 89 (*) 96 - 112 mEq/L   CO2 40 (*) 19 - 32 mEq/L   Comment: REPEATED TO VERIFY     CRITICAL RESULT CALLED TO, READ BACK BY AND VERIFIED WITH:     J.MAY,RN 0733 03/16/13 CLARK,S   Glucose, Bld 99  70 - 99 mg/dL   BUN 38 (*) 6 - 23 mg/dL   Creatinine, Ser 0.98  0.50 - 1.10 mg/dL   Calcium 9.9  8.4 - 11.9 mg/dL   GFR calc non Af Amer 49 (*) >90 mL/min   GFR calc Af Amer 57 (*) >90 mL/min   Comment:            The eGFR has been calculated      using the CKD EPI equation.     This calculation has not been     validated in all clinical     situations.     eGFR's persistently     <90 mL/min signify     possible Chronic Kidney Disease.  MAGNESIUM     Status: None   Collection Time    03/16/13  6:00 AM      Result Value Range   Magnesium 1.8  1.5 - 2.5 mg/dL  PHOSPHORUS     Status: None   Collection Time    03/16/13  6:00 AM      Result Value Range  Phosphorus 4.1  2.3 - 4.6 mg/dL  GLUCOSE, CAPILLARY     Status: None   Collection Time    03/16/13  7:52 AM      Result Value Range   Glucose-Capillary 90  70 - 99 mg/dL   Comment 1 Notify RN      Imaging: Dg Chest Port 1 View  03/15/2013   *RADIOLOGY REPORT*  Clinical Data: Pulmonary infiltrates.  PORTABLE CHEST - 1 VIEW  Comparison: 03/13/2013.  Findings: The cardiac silhouette, mediastinal and hilar contours are stable.  There are persistent bilateral infiltrates with slight improved aeration on the left.  Suspect small effusions.  No pneumothorax.  IMPRESSION: Persistent bilateral infiltrates with slight improved left basilar aeration. Small bilateral pleural effusions.   Original Report Authenticated By: Rudie Meyer, M.D.    Assessment:  1. Principal Problem: 2.   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 Active Problems: 4.   Chronic respiratory failure with hypoxia 5.   DM2 (diabetes mellitus, type 2) 6.   Unspecified hypothyroidism 7.   Normal coronary arteries at cath 2011 8.   Obesity, morbid (more than 100 lbs over ideal weight or BMI > 40) 9.   Pulmonary hypertension, severe; Severe by Echo 02/1713 10.   Hypotension 11.   Diastolic CHF, chronic- EF 65-70% May 2014 12.   Acute renal insufficiency 13.   Pneumonia, organism unspecified- on Levaquin Q 48hrs 14.   Plan:  1. Ready to transition to PO diuretics, probable DC in AM 2. Bumex 3 mg BID if weight 215 lb or under, 4 mg BID if weight >215 lb.  Time  Spent Directly with Patient:  20 minutes  Length of Stay:  LOS: 8 days    Tangela Dolliver 03/16/2013, 8:12 AM

## 2013-03-16 NOTE — Progress Notes (Signed)
CRITICAL VALUE ALERT  Critical value received:  Co2 40  Date of notification:  03/16/2013  Time of notification:  0733  Critical value read back:yes  Nurse who received alert:  Scharlene Gloss, RN   MD notified (1st page):  Junious Silk, NP   Time of first page:  [phone]

## 2013-03-17 LAB — BASIC METABOLIC PANEL
BUN: 37 mg/dL — ABNORMAL HIGH (ref 6–23)
CO2: 33 mEq/L — ABNORMAL HIGH (ref 19–32)
Calcium: 9.7 mg/dL (ref 8.4–10.5)
Chloride: 89 mEq/L — ABNORMAL LOW (ref 96–112)
Creatinine, Ser: 1.15 mg/dL — ABNORMAL HIGH (ref 0.50–1.10)
GFR calc Af Amer: 53 mL/min — ABNORMAL LOW (ref >90)
GFR calc non Af Amer: 45 mL/min — ABNORMAL LOW (ref >90)
Glucose, Bld: 93 mg/dL (ref 70–99)
Potassium: 5.3 mEq/L — ABNORMAL HIGH (ref 3.5–5.1)
Sodium: 134 mEq/L — ABNORMAL LOW (ref 135–145)

## 2013-03-17 LAB — GLUCOSE, CAPILLARY
Glucose-Capillary: 190 mg/dL — ABNORMAL HIGH (ref 70–99)
Glucose-Capillary: 141 mg/dL — ABNORMAL HIGH (ref 70–99)

## 2013-03-17 MED ORDER — BUMETANIDE 1 MG PO TABS: 3.0000 mg | ORAL_TABLET | Freq: Every day | ORAL | Status: DC

## 2013-03-17 MED ORDER — POTASSIUM CHLORIDE CRYS ER 20 MEQ PO TBCR: 20.0000 meq | EXTENDED_RELEASE_TABLET | Freq: Two times a day (BID) | ORAL | Status: DC

## 2013-03-17 MED ORDER — METOLAZONE 5 MG PO TABS: 5.0000 mg | ORAL_TABLET | Freq: Every day | ORAL | Status: DC

## 2013-03-17 MED ORDER — METOLAZONE 5 MG PO TABS
5.0000 mg | ORAL_TABLET | Freq: Every day | ORAL | Status: DC
  Administered 2013-03-17: 5 mg via ORAL
  Filled 2013-03-17: qty 1

## 2013-03-17 MED ORDER — METOLAZONE 2.5 MG PO TABS: 2.5000 mg | ORAL_TABLET | Freq: Every day | ORAL | Status: DC

## 2013-03-17 NOTE — Discharge Summary (Signed)
DISCHARGE SUMMARY  [name]  MR#: [id number]  DOB:08/31/1934  Date of Admission: 03/08/2013 Date of Discharge: 03/17/2013  Attending Physician:MCCLUNG,JEFFREY T  Patient's WUJ:WJXBJ,YNWGNFAO J., MD  Consults:  Cardiology PCCM >> TRH  Disposition: D/C Home  Follow-up Appts:     Follow-up Information   Follow up with HILTY,Kenneth C, MD. (The [facility] office will call you to arrange a visit in 5-7 days - if you do not hear from them in the next 48 hours call them to arrange )    Contact information:   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 - Acute on Chronic exacerbation Pulmonary hypertension, severe by Echo 02/1713  Diastolic CHF, chronic- EF 65-70% May 2014 - dry weight is 215 lbs Acute on chronic respiratory failure with hypoxia - due to above  DM2  Hypothyroidism  Obesity, morbid (BMI > 40)  Hypokalemia Hypotension - transient  Initial presentation: 78 year old female with hx of morbid obesity, obstructive sleep apnea, obesity hypoventilation syndrome, chronic hypoxemic respiratory failure on 6-8 L of oxygen at home, chronic diastolic heart failure, pulmonary hypertension and pedal edema who presented with increased shortness of breath and weight gain. She was admitted to the ICU by PCCm and was treated for fluid overload.  A Cardiology consult was requested to assist with diuresis. She initially diuresed about 11 L.  She was transferred over to the [facility] service on 7/22.  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 - Acute on Chronic exacerbation Pt has diureses a total of 12L for this hospital stay - symptoms resolved and pt back to her baseline - follow-up in [facility] office in 5-7 days for complicated heart failure follow-up to try  to prevent early rehospitalization - Dry weight is considered 215 lbs  Pulmonary hypertension, severe by Echo 02/1713   Diastolic CHF, chronic- EF 65-70% May 2014  Managed by cardiology - transitioned to oral diuretics - appears to be euvolemic at time of d/c - to f/u w/ [facility] - home health RN arranged per CM  Chronic respiratory failure with hypoxia  On 6 L of O2 at baseline - stable on this level of O2 at time of d/c    DM2 (diabetes mellitus, type 2)  Controlled on glipizide and sliding scale insulin during hospital stay - resume usual home meds at time of d/c   Unspecified hypothyroidism  Continue Synthroid - TSH not checked this admit   Obesity, morbid (BMI > 40)   Hypokalemia  On twice a day potassium replacement - K+ increased to 5.3 at time of d/c as diuretics were held but K+ still given - will hold K+ for 48 hrs then resume - will need recheck when seen at [facility]  Transient Hypotension Due to diuretic tx as noted above (12L negative total) - rebounded after bumex, zaroxolyn and cozaar held - resume Bumex 3 mg daily with zaroxolyn 5 mg QAM - continue to hold cozaar at discharge - Continue coreg at current dose     Medication List    STOP taking these medications       losartan 25 MG tablet  Commonly known as:  COZAAR     sulfamethoxazole-trimethoprim 800-160 MG per tablet  Commonly known as:  BACTRIM DS      TAKE these medications  ALPRAZolam 0.5 MG tablet  Commonly known as:  XANAX  Take 0.5 mg by mouth 2 (two) times daily.     aspirin 81 MG EC tablet  Take 81 mg by mouth daily.     brimonidine 0.1 % Soln  Commonly known as:  ALPHAGAN P  Place 1 drop into both eyes daily.     bumetanide 1 MG tablet  Commonly known as:  BUMEX  Take 3 tablets (3 mg total) by mouth daily.     carvedilol 3.125 MG tablet  Commonly known as:  COREG  Take 2 tablets (6.25 mg total) by mouth 2 (two) times daily with a meal.     diltiazem 120 MG 24 hr capsule  Commonly  known as:  CARDIZEM CD  Take 120 mg by mouth daily.     ezetimibe 10 MG tablet  Commonly known as:  ZETIA  Take 10 mg by mouth at bedtime.     glipiZIDE 10 MG tablet  Commonly known as:  GLUCOTROL  Take 10 mg by mouth 2 (two) times daily before a meal.     lansoprazole 30 MG capsule  Commonly known as:  PREVACID  Take 30 mg by mouth every morning.     levothyroxine 150 MCG tablet  Commonly known as:  SYNTHROID, LEVOTHROID  Take 150 mcg by mouth daily before breakfast.     linagliptin 5 MG Tabs tablet  Commonly known as:  TRADJENTA  Take 5 mg by mouth daily.     metolazone 5 MG tablet  Commonly known as:  ZAROXOLYN  Take 1 tablet (5 mg total) by mouth daily.     oxyCODONE-acetaminophen 5-325 MG per tablet  Commonly known as:  PERCOCET/ROXICET  Take 1 tablet by mouth 2 (two) times daily. May take 1 additional dose if needed for pain     potassium chloride SA 20 MEQ tablet  Commonly known as:  K-DUR,KLOR-CON  Take 1 tablet (20 mEq total) by mouth 2 (two) times daily.  Start taking on:  03/19/2013     pravastatin 20 MG tablet  Commonly known as:  PRAVACHOL  Take 20 mg by mouth at bedtime.     Travoprost (BAK Free) 0.004 % Soln ophthalmic solution  Commonly known as:  TRAVATAN  Place 1 drop into both eyes at bedtime.        Day of Discharge BP 102/66  Pulse 79  Temp(Src) 97.7 F (36.5 C) (Oral)  Resp 15  Ht 5\' 1"  (1.549 m)  Wt 99.2 kg (218 lb 11.1 oz)  BMI 41.34 kg/m2  SpO2 94%  Physical Exam: General: No acute respiratory distress at rest in chair  Lungs: faint diffuse crackles w/ no wheeze Cardiovascular: Regular rate and rhythm without murmur gallop or rub normal S1 and S2 Abdomen: Nontender, nondistended, soft, bowel sounds positive, no rebound, no ascites, no appreciable mass Extremities: No significant cyanosis, clubbing, or edema bilateral lower extremities  BASIC METABOLIC PANEL     Status: Abnormal   Collection Time    03/17/13  4:50 AM       Result Value Range   Sodium 134 (*) 135 - 145 mEq/L   Potassium 5.3 (*) 3.5 - 5.1 mEq/L   Chloride 89 (*) 96 - 112 mEq/L   CO2 33 (*) 19 - 32 mEq/L   Glucose, Bld 93  70 - 99 mg/dL   BUN 37 (*) 6 - 23 mg/dL   Creatinine, Ser 1.61 (*) 0.50 - 1.10 mg/dL   Calcium 9.7  8.4 - 10.5 mg/dL   GFR calc non Af Amer 45 (*) >90 mL/min   GFR calc Af Amer 53 (*) >90 mL/min    Time spent in discharge (includes decision making & examination of pt): 35+ minutes  03/17/2013, 11:53 AM   Lonia Blood, MD [facility]  [phone] Pager [phone]  On-Call/Text Page:      [email or web address]      password [facility]

## 2013-03-17 NOTE — Care Management Note (Signed)
    Page 1 of 2   03/17/2013     10:17:40 AM   CARE MANAGEMENT NOTE 03/17/2013  Patient:  [name], [name]   Account Number:  [id number]  Date Initiated:  03/09/2013  Documentation initiated by:  Deklen Popelka  Subjective/Objective Assessment:   78 yr-old female adm with dx of diastolic failure;  lives alone, son lives next door; has cane, walker, BSC, and home O2 through Nurse, children's  CM consult      [facility] Choice  HOME HEALTH   Choice offered to / List presented to:  C-1 Patient   HH arranged  HH-1 RN  HH-10 DISEASE MANAGEMENT      HH agency  [facility]   Status of service:  Completed, signed off  Per UR Regulation:  Reviewed for med. necessity/level of care/duration of stay  If discussed at Long Length of Stay Meetings, dates discussed:   03/16/2013   Comments:  PCP:  Dr Elfredia Nevins  Contact: Melba Coon, son  [phone]                Marcheta Grammes, dtr  [phone]  03/17/13 1011 Alazia Crocket RN MSN BSN CCM Pt anticipating discharge, requests home health RN visits, states [facility] nurse visited after previous discharge.  Provided list of [facility] health agencies, referral made per pt choice.  03/10/13 1020 Alonda Weaber RN MSN BSN CCM Dtr requests update on pt's position on [facility] waiting list. TC to [employer] @ [employer], VM message left requesting return call.   TC from [facility] with [employer], to discuss services.  She will contact pt to discuss issues. 1400 TC from Garrel Ridgel with [facility], she anticipates services will start sometime in March, 2015.  03/09/13 1523 Gabino Hagin RN MSN BSN CCM Per pt and dtr, home health RN visits from [facility], aide comes 3 hrs/day, 5 days/wk.  Two dtrs assist with cooking and cleaning on weekends.  Both pt and dtr have multiple complaints about aide, state they and son have tried to contact service agency and have been unable to talk with anyone about  problems.  TC to [facility] [employer], message left for [employer] @ [phone].   Per [facility], they are not providing services to pt. 1542  Son is now present, states visiting nurse is from [employer].

## 2013-03-17 NOTE — Progress Notes (Signed)
DAILY PROGRESS NOTE  Subjective:  Hypotensive yesterday evening. BP has rebounded today. Bumex, zaroxolyn and Cozaar held. Breathing is probably at baseline. Still net negative 1.3L yesterday - 12L negative total.  Objective:  Temp:  [97.7 F (36.5 C)-98.2 F (36.8 C)] 97.7 F (36.5 C) (07/23 0745) Pulse Rate:  [79-99] 79 (07/23 0745) Resp:  [15-21] 15 (07/23 0745) BP: (80-102)/(40-73) 102/66 mmHg (07/23 0745) SpO2:  [85 %-95 %] 94 % (07/23 0745) Weight change:   Intake/Output from previous day: 07/22 0701 - 07/23 0700 In: 240 [P.O.:240] Out: 1625 [Urine:1625]  Intake/Output from this shift:    Medications: Current Facility-Administered Medications  Medication Dose Route Frequency Provider Last Rate Last Dose  . ALPRAZolam Prudy Feeler) tablet 0.5 mg  0.5 mg Oral BID Lonia Farber, MD   0.5 mg at 03/16/13 2217  . aspirin chewable tablet 81 mg  81 mg Oral Daily Lonia Farber, MD   81 mg at 03/16/13 0911  . brimonidine (ALPHAGAN) 0.2 % ophthalmic solution 1 drop  1 drop Both Eyes Daily Lonia Farber, MD   1 drop at 03/16/13 0910  . bumetanide (BUMEX) tablet 3 mg  3 mg Oral Daily Mihai Croitoru, MD      . carvedilol (COREG) tablet 6.25 mg  6.25 mg Oral BID WC Eda Paschal Kilroy, PA-C   6.25 mg at 03/15/13 1807  . diltiazem (CARDIZEM CD) 24 hr capsule 120 mg  120 mg Oral Daily Abelino Derrick, PA-C   120 mg at 03/15/13 1158  . ezetimibe (ZETIA) tablet 10 mg  10 mg Oral QHS Lonia Farber, MD   10 mg at 03/16/13 2217  . glipiZIDE (GLUCOTROL) tablet 10 mg  10 mg Oral BID AC Lonia Farber, MD   10 mg at 03/16/13 1644  . heparin injection 5,000 Units  5,000 Units Subcutaneous Q8H Lonia Farber, MD   5,000 Units at 03/13/13 1443  . insulin aspart (novoLOG) injection 0-15 Units  0-15 Units Subcutaneous TID WC Lonia Farber, MD   3 Units at 03/16/13 1732  . insulin aspart (novoLOG) injection 0-5 Units  0-5 Units Subcutaneous  QHS Lonia Farber, MD   5 Units at 03/11/13 2157  . levothyroxine (SYNTHROID, LEVOTHROID) tablet 150 mcg  150 mcg Oral QAC breakfast Lonia Farber, MD   150 mcg at 03/16/13 0909  . linagliptin (TRADJENTA) tablet 5 mg  5 mg Oral Daily Lonia Farber, MD   5 mg at 03/16/13 0908  . oxyCODONE-acetaminophen (PERCOCET/ROXICET) 5-325 MG per tablet 1 tablet  1 tablet Oral BID Lonia Farber, MD   1 tablet at 03/16/13 2217  . pantoprazole (PROTONIX) EC tablet 40 mg  40 mg Oral Daily Lonia Farber, MD   40 mg at 03/16/13 0907  . polyethylene glycol (MIRALAX / GLYCOLAX) packet 17 g  17 g Oral Daily Leanne Chang, NP   17 g at 03/16/13 2348  . potassium chloride SA (K-DUR,KLOR-CON) CR tablet 20 mEq  20 mEq Oral BID Michele Mcalpine, MD   20 mEq at 03/16/13 2217  . simvastatin (ZOCOR) tablet 10 mg  10 mg Oral q1800 Lonia Farber, MD   10 mg at 03/16/13 1732  . Travoprost (BAK Free) (TRAVATAN) 0.004 % ophthalmic solution SOLN 1 drop  1 drop Both Eyes QHS Lonia Farber, MD   1 drop at 03/16/13 2217    Physical Exam: General appearance: alert and no distress Neck: JVD - 1 cm above sternal notch, no adenopathy, no carotid bruit, supple, symmetrical, trachea midline and thyroid not  enlarged, symmetric, no tenderness/mass/nodules Lungs: diminished breath sounds bilaterally Heart: regular rate and rhythm, S1, S2 normal, no murmur, click, rub or gallop Abdomen: protuberant, no fluid wave Extremities: edema trace bilateral Pulses: 2+ and symmetric Skin: pale, dry Neurologic: Grossly normal  Lab Results: Results for orders placed during the hospital encounter of 03/08/13 (from the past 48 hour(s))  GLUCOSE, CAPILLARY     Status: Abnormal   Collection Time    03/15/13 12:35 PM      Result Value Range   Glucose-Capillary 148 (*) 70 - 99 mg/dL   Comment 1 Notify RN    GLUCOSE, CAPILLARY     Status: Abnormal   Collection Time     03/15/13  4:23 PM      Result Value Range   Glucose-Capillary 196 (*) 70 - 99 mg/dL   Comment 1 Notify RN    GLUCOSE, CAPILLARY     Status: Abnormal   Collection Time    03/15/13  8:33 PM      Result Value Range   Glucose-Capillary 180 (*) 70 - 99 mg/dL   Comment 1 Notify RN    BASIC METABOLIC PANEL     Status: Abnormal   Collection Time    03/16/13  6:00 AM      Result Value Range   Sodium 138  135 - 145 mEq/L   Potassium 3.4 (*) 3.5 - 5.1 mEq/L   Chloride 89 (*) 96 - 112 mEq/L   CO2 40 (*) 19 - 32 mEq/L   Comment: REPEATED TO VERIFY     CRITICAL RESULT CALLED TO, READ BACK BY AND VERIFIED WITH:     J.MAY,RN [id number] 03/16/13 CLARK,S   Glucose, Bld 99  70 - 99 mg/dL   BUN 38 (*) 6 - 23 mg/dL   Creatinine, Ser 1.61  0.50 - 1.10 mg/dL   Calcium 9.9  8.4 - 09.6 mg/dL   GFR calc non Af Amer 49 (*) >90 mL/min   GFR calc Af Amer 57 (*) >90 mL/min   Comment:            The eGFR has been calculated     using the CKD EPI equation.     This calculation has not been     validated in all clinical     situations.     eGFR's persistently     <90 mL/min signify     possible Chronic Kidney Disease.  MAGNESIUM     Status: None   Collection Time    03/16/13  6:00 AM      Result Value Range   Magnesium 1.8  1.5 - 2.5 mg/dL  PHOSPHORUS     Status: None   Collection Time    03/16/13  6:00 AM      Result Value Range   Phosphorus 4.1  2.3 - 4.6 mg/dL  GLUCOSE, CAPILLARY     Status: None   Collection Time    03/16/13  7:52 AM      Result Value Range   Glucose-Capillary 90  70 - 99 mg/dL   Comment 1 Notify RN    GLUCOSE, CAPILLARY     Status: Abnormal   Collection Time    03/16/13 12:40 PM      Result Value Range   Glucose-Capillary 186 (*) 70 - 99 mg/dL   Comment 1 Notify RN    GLUCOSE, CAPILLARY     Status: Abnormal   Collection Time    03/16/13  5:31 PM  Result Value Range   Glucose-Capillary 190 (*) 70 - 99 mg/dL   Comment 1 Documented in Chart     Comment 2 Notify RN      GLUCOSE, CAPILLARY     Status: Abnormal   Collection Time    03/16/13 10:04 PM      Result Value Range   Glucose-Capillary 102 (*) 70 - 99 mg/dL   Comment 1 Notify RN    BASIC METABOLIC PANEL     Status: Abnormal   Collection Time    03/17/13  4:50 AM      Result Value Range   Sodium 134 (*) 135 - 145 mEq/L   Potassium 5.3 (*) 3.5 - 5.1 mEq/L   Comment: HEMOLYSIS AT THIS LEVEL MAY AFFECT RESULT   Chloride 89 (*) 96 - 112 mEq/L   CO2 33 (*) 19 - 32 mEq/L   Glucose, Bld 93  70 - 99 mg/dL   BUN 37 (*) 6 - 23 mg/dL   Creatinine, Ser 1.61 (*) 0.50 - 1.10 mg/dL   Calcium 9.7  8.4 - 09.6 mg/dL   GFR calc non Af Amer 45 (*) >90 mL/min   GFR calc Af Amer 53 (*) >90 mL/min   Comment:            The eGFR has been calculated     using the CKD EPI equation.     This calculation has not been     validated in all clinical     situations.     eGFR's persistently     <90 mL/min signify     possible Chronic Kidney Disease.  GLUCOSE, CAPILLARY     Status: None   Collection Time    03/17/13  7:47 AM      Result Value Range   Glucose-Capillary 82  70 - 99 mg/dL   Comment 1 Documented in Chart     Comment 2 Notify RN      Imaging: No results found.  Assessment:  1. Principal Problem: 2.   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 Active Problems: 4.   Chronic respiratory failure with hypoxia 5.   DM2 (diabetes mellitus, type 2) 6.   Unspecified hypothyroidism 7.   Normal coronary arteries at cath 2011 8.   Obesity, morbid (more than 100 lbs over ideal weight or BMI > 40) 9.   Pulmonary hypertension, severe; Severe by Echo 02/1713 10.   Hypotension 11.   Diastolic CHF, chronic- EF 65-70% May 2014 12.   Acute renal insufficiency 13.   Pneumonia, organism unspecified- on Levaquin Q 48hrs 14.   Plan:  1. Resume Bumex 3 mg daily with zaroxolyn 5 mg QAM.  Continue to hold cozaar at discharge. Continue coreg at current dose. Continue  home oxygen as per Dr. Craige Cotta. Ok for discharge home from our standpoint. We will arrange early follow-up in our office with me or a mid-level provider in 5-7 days (TCM 7) for complicated, heart failure follow-up to try to prevent early rehospitalization. Dry weight is considered 215 lbs. We will re-evaluate her blood pressure medications at that time.  Time Spent Directly with Patient:  15 minutes  Length of Stay:  LOS: 9 days   Chrystie Nose, MD, [facility] Attending Cardiologist [facility]  HILTY,Kenneth C 03/17/2013, 9:29 AM

## 2013-03-21 ENCOUNTER — Encounter: Payer: Self-pay | Admitting: Pulmonary Disease

## 2013-03-26 ENCOUNTER — Encounter: Payer: Self-pay | Admitting: Cardiology

## 2013-03-26 ENCOUNTER — Ambulatory Visit (INDEPENDENT_AMBULATORY_CARE_PROVIDER_SITE_OTHER): Payer: Medicare Other | Admitting: Cardiology

## 2013-03-26 VITALS — BP 100/68 | HR 69 | Ht 61.0 in | Wt 199.4 lb

## 2013-03-26 DIAGNOSIS — I272 Pulmonary hypertension, unspecified: Secondary | ICD-10-CM

## 2013-03-26 DIAGNOSIS — I5031 Acute diastolic (congestive) heart failure: Secondary | ICD-10-CM

## 2013-03-26 DIAGNOSIS — I2729 Other secondary pulmonary hypertension: Secondary | ICD-10-CM

## 2013-03-26 DIAGNOSIS — I959 Hypotension, unspecified: Secondary | ICD-10-CM

## 2013-03-26 DIAGNOSIS — R9431 Abnormal electrocardiogram [ECG] [EKG]: Secondary | ICD-10-CM

## 2013-03-26 DIAGNOSIS — J9611 Chronic respiratory failure with hypoxia: Secondary | ICD-10-CM

## 2013-03-26 DIAGNOSIS — J9610 Chronic respiratory failure, unspecified whether with hypoxia or hypercapnia: Secondary | ICD-10-CM

## 2013-03-26 DIAGNOSIS — I2789 Other specified pulmonary heart diseases: Secondary | ICD-10-CM

## 2013-03-26 DIAGNOSIS — R0902 Hypoxemia: Secondary | ICD-10-CM

## 2013-03-26 DIAGNOSIS — Z0389 Encounter for observation for other suspected diseases and conditions ruled out: Secondary | ICD-10-CM

## 2013-03-26 DIAGNOSIS — I509 Heart failure, unspecified: Secondary | ICD-10-CM

## 2013-03-26 LAB — BASIC METABOLIC PANEL WITH GFR
CO2: 47 mEq/L — ABNORMAL HIGH (ref 19–32)
Calcium: 10.5 mg/dL (ref 8.4–10.5)
Chloride: 81 mEq/L — ABNORMAL LOW (ref 96–112)
Glucose, Bld: 230 mg/dL — ABNORMAL HIGH (ref 70–99)
Sodium: 136 mEq/L (ref 135–145)

## 2013-03-26 MED ORDER — METOLAZONE 5 MG PO TABS: 5.0000 mg | ORAL_TABLET | ORAL | Status: DC

## 2013-03-26 NOTE — Patient Instructions (Addendum)
Weigh daily Call [phone] if weight climbs more than 3 pounds in a day or 5 pounds in a week. No salt to very little salt in your diet.  No more than 2000 mg in a day. Call if increased shortness of breath or increased swelling.   follow up with Dr. Rennis Golden or Nada Boozer, NP-C in 2 weeks  Have lab work done today  Take Metolazone ( Zaroxolyn) only on Monday and Tuesday

## 2013-03-27 LAB — BRAIN NATRIURETIC PEPTIDE: Brain Natriuretic Peptide: 458.6 pg/mL — ABNORMAL HIGH (ref 0.0–100.0)

## 2013-03-28 ENCOUNTER — Telehealth: Payer: Self-pay | Admitting: Cardiology

## 2013-03-28 NOTE — Telephone Encounter (Signed)
I called pt and asked her to take an extra 20 meq of potassium today 03/28/13.  We decreased the diuretic which will help with K+ also. Will call to recheck tomorrow.

## 2013-03-29 ENCOUNTER — Telehealth: Payer: Self-pay | Admitting: *Deleted

## 2013-03-29 ENCOUNTER — Encounter: Payer: Self-pay | Admitting: Cardiology

## 2013-03-29 DIAGNOSIS — Z79899 Other long term (current) drug therapy: Secondary | ICD-10-CM

## 2013-03-29 NOTE — Assessment & Plan Note (Addendum)
Much improved with weight down to 199 pounds on our scale which is lower than her presumed dry weight of 215.  Therefore we will decrease her Zaroxolyn to 5 mg twice a week only. She will call if her weight continues to decrease or if she starts gaining significantly.  We'll also check a basic metabolic panel.

## 2013-03-29 NOTE — Telephone Encounter (Signed)
Message copied by Chauncey Reading on Mon Mar 29, 2013 11:25 AM ------      Message from: Leone Brand      Created: Sun Mar 28, 2013  5:00 PM       Pt need another [facility] Monday 03/29/13.  To recheck potassium.  I called her Sunday and asked her to take an extra 20 meq. ------

## 2013-03-29 NOTE — Assessment & Plan Note (Signed)
Is on home oxygen now 24 7 and was wearing it here in the office.

## 2013-03-29 NOTE — Telephone Encounter (Signed)
Call to pt and informed lab needs to be repeated.  Pt informed order already placed.  Pt agreed to get lab drawn today.

## 2013-03-29 NOTE — Progress Notes (Signed)
03/29/2013   PCP: Cassell Smiles., MD   Chief Complaint  Patient presents with  . Follow-up    post hospital.  States she has been feeling good since discharge.    Primary Cardiologist: Dr. Rennis Golden  HPI:  78 y/o, morbidly obese female, followed by Dr. Rennis Golden, with a history of right heart failure, pulmonary hypertension and obstructive sleep apnea, intolerant to CPAP. She has had multiple admissions this year for acute on chronic diastolic HF. Her last echo was on 01/12/2013, which demonstrated normal LV function, with an EF of 65-70% and grade I diastolic dysfunction. She presented back to [facility] July 15 with a complaint of progressive resting dyspnea and bilateral lower extremity edema. She noted associated weight gain, orthopnea and PND. She denied any chest pain. She was diuresed, and improved. Dry weight was thought to be 215 pounds.   She was diuresed 11 liters in the hospital.   At discharge he was to be on Bumex 3 mg daily and Zaroxolyn 5 mg every morning the Cozaar was held secondary to hypotension. Also she continued with home oxygen.  Unfortunately Bumex was not being made so she was placed on Demadex 60 mg twice a day.  Since discharge she has done quite well, her weight dropped quite dramatically to 199 pounds.  She feels much better not we can all objects and feels like her energy has increased somewhat. At her home her weight has been 196.  She denies shortness of breath or chest pain and she does wear oxygen.   Allergies  Allergen Reactions  . Darvocet (Propoxyphene-Acetaminophen) Nausea And Vomiting  . Diphenhydramine Hcl Other (See Comments)    Glaucoma prevents patient from taking this medication   . Aloe Vera Rash  . Penicillins Swelling and Rash    Current Outpatient Prescriptions  Medication Sig Dispense Refill  . ALPRAZolam (XANAX) 0.5 MG tablet Take 0.5 mg by mouth 2 (two) times daily.       Marland Kitchen aspirin 81 MG EC tablet Take 81 mg by mouth daily.      .  brimonidine (ALPHAGAN P) 0.1 % SOLN Place 1 drop into both eyes daily.      . carvedilol (COREG) 3.125 MG tablet Take 2 tablets (6.25 mg total) by mouth 2 (two) times daily with a meal.  60 tablet  1  . diltiazem (CARDIZEM CD) 120 MG 24 hr capsule Take 120 mg by mouth daily.      Marland Kitchen ezetimibe (ZETIA) 10 MG tablet Take 10 mg by mouth at bedtime.        Marland Kitchen glipiZIDE (GLUCOTROL) 10 MG tablet Take 10 mg by mouth 2 (two) times daily before a meal.        . lansoprazole (PREVACID) 30 MG capsule Take 30 mg by mouth every morning.       Marland Kitchen levothyroxine (SYNTHROID, LEVOTHROID) 150 MCG tablet Take 150 mcg by mouth daily before breakfast.       . linagliptin (TRADJENTA) 5 MG TABS tablet Take 5 mg by mouth daily.      . metolazone (ZAROXOLYN) 5 MG tablet Take 1 tablet (5 mg total) by mouth 2 (two) times a week. Take 5 mg by mouth  On Monday and Thursday.  20 tablet  2  . oxyCODONE-acetaminophen (PERCOCET/ROXICET) 5-325 MG per tablet Take 1 tablet by mouth 2 (two) times daily. May take 1 additional dose if needed for pain      . potassium chloride SA (K-DUR,KLOR-CON) 20  MEQ tablet Take 1 tablet (20 mEq total) by mouth 2 (two) times daily.      . pravastatin (PRAVACHOL) 20 MG tablet Take 20 mg by mouth at bedtime.       . torsemide (DEMADEX) 20 MG tablet Take 60 mg by mouth 2 (two) times daily.      . Travoprost, BAK Free, (TRAVATAN) 0.004 % SOLN ophthalmic solution Place 1 drop into both eyes at bedtime.       No current facility-administered medications for this visit.    Past Medical History  Diagnosis Date  . Diabetes mellitus   . Hypertension   . Shingles   . GERD (gastroesophageal reflux disease)   . Abnormal heart rhythms   . Thyroid disease     hypthyroidism  . Pulmonary hypertension, moderate to severe     No evidence of CAD on Cath 2011  . Obesity, morbid (more than 100 lbs over ideal weight or BMI > 40)   . Chronic respiratory failure with hypoxia 01/23/2012    Related to OHS  . Glaucoma     . CHF (congestive heart failure)     Echo at [facility] 10/14/2012 see notes  . Acute on chronic renal failure     Past Surgical History  Procedure Laterality Date  . Tubal ligation    . Ovary removed    . Knee arthroscopy      left  . Cardiovascular stress test  09/15/2006    EF 81%; normal perfusion all regions; LV normal in size; no scintigraphic evidence of inducible myocardial ischemia  . Cardiac catheterization  02/21/2010    EF 65%; PA pressure 86/21 with mean of 44; normal coronary arteries, primary pumonary hypertension; no significant wall motion abnormalities    WUJ:WJXBJYN:WG colds or fevers, significant weight changes Skin:no rashes or ulcers HEENT:no blurred vision, no congestion CV:see HPI PUL:see HPI GI:no diarrhea constipation or melena, no indigestion GU:no hematuria, no dysuria MS:no joint pain, no claudication Neuro:no syncope, no lightheadedness Endo:+ diabetes, + thyroid disease  PHYSICAL EXAM BP 100/68  Pulse 69  Ht 5\' 1"  (1.549 m)  Wt 199 lb 6.4 oz (90.447 kg)  BMI 37.7 kg/m2 General:Pleasant affect, NAD Skin:Warm and dry, brisk capillary refill HEENT:normocephalic, sclera clear, mucus membranes moist, nasal cannula with oxygen in place. Neck:supple, no JVD, no bruits  Heart:S1S2 RRR without murmur, gallup, rub or click Lungs:clear without rales, rhonchi, or wheezes NFA:OZHY, non tender, + BS, do not palpate liver spleen or masses Ext:no lower ext edema, 2+ pedal pulses, 2+ radial pulses Neuro:alert and oriented, MAE, follows commands, + facial symmetry  EKG: Sinus rhythm rate of 69 first degree AV block PR interval is 210 ms has an incomplete right bundle branch block and she does have ST depression in leads 23 aVF V3 through V6 but these are old changes  ASSESSMENT AND PLAN 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 - With Acute on Chronic exaverbation Much improved with weight down to 199 pounds on our scale which is lower than her presumed dry  weight of 215.  Therefore we will decrease her Zaroxolyn to 5 mg twice a week only. She will call if her weight continues to decrease or if she starts gaining significantly.  We'll also check a basic metabolic panel.  Obesity, morbid (more than 100 lbs over ideal weight or BMI > 40) Weight has improved with diuresis once this is more stable we will again discuss weight loss with the patient  Chronic respiratory failure with  hypoxia Is on home oxygen now 24 7 and was wearing it here in the office.  Normal coronary arteries at cath 2011 No chest pain  Hypotension Stable blood pressure currently.    She'll follow with Dr. Rennis Golden or myself in 2 weeks.

## 2013-03-29 NOTE — Assessment & Plan Note (Signed)
No chest pain

## 2013-03-29 NOTE — Assessment & Plan Note (Signed)
Stable blood pressure currently.

## 2013-03-29 NOTE — Assessment & Plan Note (Signed)
Weight has improved with diuresis once this is more stable we will again discuss weight loss with the patient

## 2013-03-30 ENCOUNTER — Telehealth: Payer: Self-pay | Admitting: Cardiology

## 2013-03-30 DIAGNOSIS — Z79899 Other long term (current) drug therapy: Secondary | ICD-10-CM

## 2013-03-30 LAB — BASIC METABOLIC PANEL
CO2: 42 mEq/L — ABNORMAL HIGH (ref 19–32)
Calcium: 10.6 mg/dL — ABNORMAL HIGH (ref 8.4–10.5)
Chloride: 78 mEq/L — ABNORMAL LOW (ref 96–112)
Glucose, Bld: 199 mg/dL — ABNORMAL HIGH (ref 70–99)
Sodium: 134 mEq/L — ABNORMAL LOW (ref 135–145)

## 2013-03-30 NOTE — Telephone Encounter (Signed)
Notes Recorded by Nada Boozer, NP on 03/30/2013 at 8:34 AM Potassium better. Take an extra 20 meq today. If she is still loosing weight then stop metolazone completely. Aslo decrease Demadex to 40 mg twice a day. Thanks.   Call to pt and results given per MD.  Pt repeated instructions and verbalized understanding.  Lab Ordered.

## 2013-03-30 NOTE — Telephone Encounter (Signed)
High Alert CO2 is 42I

## 2013-04-01 ENCOUNTER — Ambulatory Visit (INDEPENDENT_AMBULATORY_CARE_PROVIDER_SITE_OTHER): Payer: Medicare Other | Admitting: Pulmonary Disease

## 2013-04-01 ENCOUNTER — Telehealth: Payer: Self-pay | Admitting: *Deleted

## 2013-04-01 ENCOUNTER — Encounter: Payer: Self-pay | Admitting: Pulmonary Disease

## 2013-04-01 VITALS — BP 118/84 | HR 69 | Temp 98.5°F | Ht 61.0 in | Wt 191.0 lb

## 2013-04-01 DIAGNOSIS — J9611 Chronic respiratory failure with hypoxia: Secondary | ICD-10-CM

## 2013-04-01 DIAGNOSIS — R0902 Hypoxemia: Secondary | ICD-10-CM

## 2013-04-01 DIAGNOSIS — J9610 Chronic respiratory failure, unspecified whether with hypoxia or hypercapnia: Secondary | ICD-10-CM

## 2013-04-01 NOTE — Assessment & Plan Note (Signed)
Related to OHS and diastolic dysfunction.  She is doing much better compared to her status during recent hospital stay.    Discussed with pt whether to consider specific therapy for her pulmonary hypertension.  The main cause of this was related to her low oxygen levels, and these are better with current regimen.  Will defer further interventions for her pulmonary hypertension for now.

## 2013-04-01 NOTE — Telephone Encounter (Signed)
Faxed to [facility], Attn Marja Kays medication clarification. Bumex twice weekly, on Monday & Thursday.

## 2013-04-01 NOTE — Progress Notes (Signed)
Chief Complaint  Patient presents with  . dyspnea followup    chronic respiratory failure    CC: [name]  History of Present Illness: [name] is a 78 y.o. female with dyspnea, OHS, morbid obesity, diastolic CHF and deconditioning.  She is here for hospital follow up.  She is doing much better.  She is maintaining her oxygen level with 6 liters oximyzer.  She is able to walk around her house more, but is not doing much other activity.  She denies chest pain, or cough.  Her leg swelling is much better.   Wt Readings from Last 8 Encounters:  04/01/13 191 lb (86.637 kg)  03/26/13 199 lb 6.4 oz (90.447 kg)  03/15/13 218 lb 11.1 oz (99.2 kg)  02/22/13 223 lb 3.2 oz (101.243 kg)  01/19/13 220 lb (99.791 kg)  01/16/13 219 lb 1.6 oz (99.383 kg)  12/28/12 226 lb 8 oz (102.74 kg)  10/20/12 216 lb 7.9 oz (98.2 kg)    Tests: Echo 09/19/09>>mild LVH, EF 55%, diastolic dysfx, mild MR CXR 01/23/12>>minimal scar in lingula, otherwise clear lungs PSG 01/29/12>>AHI 2.5, PLMI 28.1. Needed 3 liters oxygen. PFT 03/10/12>>FEV1 1.65 (100%), FEV1% 78, TLC 3.96 (95%), DLCO 53, no BD 01/12/13 Echo >> PA 83 mmHg, mod LVH, EF 65 to 70% Doppler legs 01/12/13 >> no DVT V/Q scan 01/12/13 >> no PE ONO with 6 liters 03/02/13 >> Test time 4 hrs 23 min.  Basal SpO2 83%, low SpO2 72%.  Spent 251 min with SpO2 < 88% Echo bubble study 03/11/13 >> mod LVH, EF 60 to 65%, mod RV dilation, inadequate microbubble contrast, mod/severe TR, PAS 74 mmHg  She  has a past medical history of Diabetes mellitus; Hypertension; Shingles; GERD (gastroesophageal reflux disease); Abnormal heart rhythms; Thyroid disease; Pulmonary hypertension, moderate to severe; Obesity, morbid (more than 100 lbs over ideal weight or BMI > 40); Chronic respiratory failure with hypoxia (01/23/2012); Glaucoma; CHF (congestive heart failure); and Acute on chronic renal failure.  She  has past surgical history that includes Tubal ligation; ovary  removed; Knee arthroscopy; Cardiovascular stress test (09/15/2006); and Cardiac catheterization (02/21/2010).   Outpatient Encounter Prescriptions as of 04/01/2013  Medication Sig Dispense Refill  . ALPRAZolam (XANAX) 0.5 MG tablet Take 0.5 mg by mouth 2 (two) times daily.       Marland Kitchen aspirin 81 MG EC tablet Take 81 mg by mouth daily.      . brimonidine (ALPHAGAN P) 0.1 % SOLN Place 1 drop into both eyes daily.      . carvedilol (COREG) 3.125 MG tablet Take 2 tablets (6.25 mg total) by mouth 2 (two) times daily with a meal.  60 tablet  1  . diltiazem (CARDIZEM CD) 120 MG 24 hr capsule Take 120 mg by mouth daily.      Marland Kitchen ezetimibe (ZETIA) 10 MG tablet Take 10 mg by mouth at bedtime.        Marland Kitchen glipiZIDE (GLUCOTROL) 10 MG tablet Take 10 mg by mouth 2 (two) times daily before a meal.        . lansoprazole (PREVACID) 30 MG capsule Take 30 mg by mouth every morning.       Marland Kitchen levothyroxine (SYNTHROID, LEVOTHROID) 150 MCG tablet Take 150 mcg by mouth daily before breakfast.       . linagliptin (TRADJENTA) 5 MG TABS tablet Take 5 mg by mouth daily.      Marland Kitchen oxyCODONE-acetaminophen (PERCOCET/ROXICET) 5-325 MG per tablet Take 1 tablet by mouth 2 (two)  times daily. May take 1 additional dose if needed for pain      . potassium chloride SA (K-DUR,KLOR-CON) 20 MEQ tablet Take 1 tablet (20 mEq total) by mouth 2 (two) times daily.      . pravastatin (PRAVACHOL) 20 MG tablet Take 20 mg by mouth at bedtime.       . torsemide (DEMADEX) 20 MG tablet Take 60 mg by mouth 2 (two) times daily.      . Travoprost, BAK Free, (TRAVATAN) 0.004 % SOLN ophthalmic solution Place 1 drop into both eyes at bedtime.      . [DISCONTINUED] metolazone (ZAROXOLYN) 5 MG tablet Take 1 tablet (5 mg total) by mouth 2 (two) times a week. Take 5 mg by mouth  On Monday and Thursday.  20 tablet  2   No facility-administered encounter medications on file as of 04/01/2013.    Allergies  Allergen Reactions  . Darvocet (Propoxyphene-Acetaminophen) Nausea  And Vomiting  . Diphenhydramine Hcl Other (See Comments)    Glaucoma prevents patient from taking this medication   . Aloe Vera Rash  . Penicillins Swelling and Rash    Physical Exam:  General - Using supplemental oxygen, sitting in wheelchair HEENT - No sinus tenderness, MP 3, no oral exudate, no LAN  Cardiac - s1s2 regular, no murmur  Chest - decreased breath sounds, no wheeze/rales  Abdomen - obese, soft, non tender  Extremities - minimal lower extremity edema Neurologic - normal strength Skin - no rashes  Psychiatric - normal mood, behavior   Assessment/Plan:  Coralyn Helling, MD [place] [facility] Pager:  [phone] 04/01/2013, 2:07 PM

## 2013-04-01 NOTE — Patient Instructions (Signed)
Follow up in 6 months 

## 2013-04-03 LAB — BASIC METABOLIC PANEL
BUN: 77 mg/dL — ABNORMAL HIGH (ref 6–23)
CO2: 46 mEq/L — ABNORMAL HIGH (ref 19–32)
Calcium: 10.6 mg/dL — ABNORMAL HIGH (ref 8.4–10.5)
Chloride: 77 mEq/L — ABNORMAL LOW (ref 96–112)
Creat: 1.43 mg/dL — ABNORMAL HIGH (ref 0.50–1.10)
Glucose, Bld: 186 mg/dL — ABNORMAL HIGH (ref 70–99)

## 2013-04-04 ENCOUNTER — Inpatient Hospital Stay (HOSPITAL_COMMUNITY)
Admission: EM | Admit: 2013-04-04 | Discharge: 2013-04-06 | DRG: 683 | Disposition: A | Discharge status: Home / Self Care | Payer: Medicare Other | Attending: Internal Medicine | Admitting: Internal Medicine

## 2013-04-04 ENCOUNTER — Encounter (HOSPITAL_COMMUNITY): Payer: Self-pay | Admitting: Internal Medicine

## 2013-04-04 ENCOUNTER — Emergency Department (HOSPITAL_COMMUNITY): Payer: Medicare Other

## 2013-04-04 DIAGNOSIS — I50810 Right heart failure, unspecified: Secondary | ICD-10-CM

## 2013-04-04 DIAGNOSIS — E871 Hypo-osmolality and hyponatremia: Secondary | ICD-10-CM

## 2013-04-04 DIAGNOSIS — R0902 Hypoxemia: Secondary | ICD-10-CM

## 2013-04-04 DIAGNOSIS — E869 Volume depletion, unspecified: Secondary | ICD-10-CM

## 2013-04-04 DIAGNOSIS — E662 Morbid (severe) obesity with alveolar hypoventilation: Secondary | ICD-10-CM | POA: Diagnosis present

## 2013-04-04 DIAGNOSIS — D696 Thrombocytopenia, unspecified: Secondary | ICD-10-CM

## 2013-04-04 DIAGNOSIS — I272 Pulmonary hypertension, unspecified: Secondary | ICD-10-CM

## 2013-04-04 DIAGNOSIS — Z833 Family history of diabetes mellitus: Secondary | ICD-10-CM

## 2013-04-04 DIAGNOSIS — R531 Weakness: Secondary | ICD-10-CM | POA: Diagnosis present

## 2013-04-04 DIAGNOSIS — G4733 Obstructive sleep apnea (adult) (pediatric): Secondary | ICD-10-CM | POA: Diagnosis present

## 2013-04-04 DIAGNOSIS — E119 Type 2 diabetes mellitus without complications: Secondary | ICD-10-CM

## 2013-04-04 DIAGNOSIS — J9610 Chronic respiratory failure, unspecified whether with hypoxia or hypercapnia: Secondary | ICD-10-CM | POA: Diagnosis present

## 2013-04-04 DIAGNOSIS — E876 Hypokalemia: Secondary | ICD-10-CM

## 2013-04-04 DIAGNOSIS — I509 Heart failure, unspecified: Secondary | ICD-10-CM | POA: Diagnosis present

## 2013-04-04 DIAGNOSIS — Z8249 Family history of ischemic heart disease and other diseases of the circulatory system: Secondary | ICD-10-CM

## 2013-04-04 DIAGNOSIS — Z823 Family history of stroke: Secondary | ICD-10-CM

## 2013-04-04 DIAGNOSIS — Z6837 Body mass index (BMI) 37.0-37.9, adult: Secondary | ICD-10-CM

## 2013-04-04 DIAGNOSIS — I5032 Chronic diastolic (congestive) heart failure: Secondary | ICD-10-CM

## 2013-04-04 DIAGNOSIS — H409 Unspecified glaucoma: Secondary | ICD-10-CM | POA: Diagnosis present

## 2013-04-04 DIAGNOSIS — E039 Hypothyroidism, unspecified: Secondary | ICD-10-CM

## 2013-04-04 DIAGNOSIS — K219 Gastro-esophageal reflux disease without esophagitis: Secondary | ICD-10-CM | POA: Diagnosis present

## 2013-04-04 DIAGNOSIS — I2789 Other specified pulmonary heart diseases: Secondary | ICD-10-CM | POA: Diagnosis present

## 2013-04-04 DIAGNOSIS — J9611 Chronic respiratory failure with hypoxia: Secondary | ICD-10-CM

## 2013-04-04 DIAGNOSIS — N179 Acute kidney failure, unspecified: Principal | ICD-10-CM

## 2013-04-04 DIAGNOSIS — Z79899 Other long term (current) drug therapy: Secondary | ICD-10-CM

## 2013-04-04 DIAGNOSIS — IMO0001 Reserved for inherently not codable concepts without codable children: Secondary | ICD-10-CM

## 2013-04-04 DIAGNOSIS — I959 Hypotension, unspecified: Secondary | ICD-10-CM

## 2013-04-04 DIAGNOSIS — I129 Hypertensive chronic kidney disease with stage 1 through stage 4 chronic kidney disease, or unspecified chronic kidney disease: Secondary | ICD-10-CM | POA: Diagnosis present

## 2013-04-04 DIAGNOSIS — N189 Chronic kidney disease, unspecified: Secondary | ICD-10-CM | POA: Diagnosis present

## 2013-04-04 DIAGNOSIS — T502X5A Adverse effect of carbonic-anhydrase inhibitors, benzothiadiazides and other diuretics, initial encounter: Secondary | ICD-10-CM | POA: Diagnosis present

## 2013-04-04 LAB — COMPREHENSIVE METABOLIC PANEL
ALT: 18 U/L (ref 0–35)
Albumin: 3.9 g/dL (ref 3.5–5.2)
Calcium: 10.2 mg/dL (ref 8.4–10.5)
GFR calc Af Amer: 29 mL/min — ABNORMAL LOW (ref >90)
Glucose, Bld: 264 mg/dL — ABNORMAL HIGH (ref 70–99)
Potassium: 2.9 mEq/L — ABNORMAL LOW (ref 3.5–5.1)
Sodium: 125 mEq/L — ABNORMAL LOW (ref 135–145)
Total Protein: 6.8 g/dL (ref 6.0–8.3)

## 2013-04-04 LAB — CBC WITH DIFFERENTIAL/PLATELET
Basophils Absolute: 0 10*3/uL (ref 0.0–0.1)
Basophils Relative: 0 % (ref 0–1)
Eosinophils Absolute: 0.1 10*3/uL (ref 0.0–0.7)
Eosinophils Relative: 1 % (ref 0–5)
Lymphs Abs: 1.4 10*3/uL (ref 0.7–4.0)
MCH: 28.9 pg (ref 26.0–34.0)
MCHC: 33.9 g/dL (ref 30.0–36.0)
MCV: 85.4 fL (ref 78.0–100.0)
Neutrophils Relative %: 77 % (ref 43–77)
Platelets: 138 10*3/uL — ABNORMAL LOW (ref 150–400)
RDW: 13.2 % (ref 11.5–15.5)

## 2013-04-04 LAB — APTT: aPTT: 34 seconds (ref 24–37)

## 2013-04-04 LAB — PROTIME-INR
INR: 0.99 (ref 0.00–1.49)
Prothrombin Time: 12.9 seconds (ref 11.6–15.2)

## 2013-04-04 LAB — GLUCOSE, CAPILLARY: Glucose-Capillary: 163 mg/dL — ABNORMAL HIGH (ref 70–99)

## 2013-04-04 MED ORDER — BISACODYL 10 MG RE SUPP: 10.0000 mg | Freq: Every day | RECTAL | Status: DC | PRN

## 2013-04-04 MED ORDER — TRAZODONE HCL 50 MG PO TABS: 25.0000 mg | ORAL_TABLET | Freq: Every evening | ORAL | Status: DC | PRN

## 2013-04-04 MED ORDER — INSULIN ASPART 100 UNIT/ML ~~LOC~~ SOLN
0.0000 [IU] | Freq: Three times a day (TID) | SUBCUTANEOUS | Status: DC
  Administered 2013-04-05: 2 [IU] via SUBCUTANEOUS
  Administered 2013-04-05: 1 [IU] via SUBCUTANEOUS
  Administered 2013-04-06: 2 [IU] via SUBCUTANEOUS

## 2013-04-04 MED ORDER — DOCUSATE SODIUM 100 MG PO CAPS
100.0000 mg | ORAL_CAPSULE | Freq: Two times a day (BID) | ORAL | Status: DC
  Administered 2013-04-04 – 2013-04-06 (×4): 100 mg via ORAL
  Filled 2013-04-04 (×4): qty 1

## 2013-04-04 MED ORDER — POTASSIUM CHLORIDE 20 MEQ PO PACK
40.0000 meq | PACK | Freq: Once | ORAL | Status: AC
  Administered 2013-04-04: 40 meq via ORAL
  Filled 2013-04-04: qty 2

## 2013-04-04 MED ORDER — BRIMONIDINE TARTRATE 0.15 % OP SOLN
1.0000 [drp] | Freq: Every day | OPHTHALMIC | Status: DC
  Administered 2013-04-05 – 2013-04-06 (×2): 1 [drp] via OPHTHALMIC
  Filled 2013-04-04 (×9): qty 0.1

## 2013-04-04 MED ORDER — ASPIRIN EC 81 MG PO TBEC
81.0000 mg | DELAYED_RELEASE_TABLET | Freq: Every morning | ORAL | Status: DC
  Administered 2013-04-05 – 2013-04-06 (×2): 81 mg via ORAL
  Filled 2013-04-04 (×2): qty 1

## 2013-04-04 MED ORDER — EZETIMIBE 10 MG PO TABS
10.0000 mg | ORAL_TABLET | Freq: Every day | ORAL | Status: DC
  Administered 2013-04-04 – 2013-04-05 (×2): 10 mg via ORAL
  Filled 2013-04-04 (×2): qty 1

## 2013-04-04 MED ORDER — ALPRAZOLAM 0.5 MG PO TABS
0.5000 mg | ORAL_TABLET | Freq: Two times a day (BID) | ORAL | Status: DC
  Administered 2013-04-04 – 2013-04-06 (×4): 0.5 mg via ORAL
  Filled 2013-04-04 (×4): qty 1

## 2013-04-04 MED ORDER — OXYCODONE-ACETAMINOPHEN 5-325 MG PO TABS
1.0000 | ORAL_TABLET | Freq: Two times a day (BID) | ORAL | Status: DC
  Administered 2013-04-04 – 2013-04-06 (×4): 1 via ORAL
  Filled 2013-04-04 (×4): qty 1

## 2013-04-04 MED ORDER — GLIPIZIDE 5 MG PO TABS
10.0000 mg | ORAL_TABLET | Freq: Two times a day (BID) | ORAL | Status: DC
  Administered 2013-04-05 – 2013-04-06 (×3): 10 mg via ORAL
  Filled 2013-04-04 (×3): qty 2

## 2013-04-04 MED ORDER — TRAVOPROST (BAK FREE) 0.004 % OP SOLN
1.0000 [drp] | Freq: Every day | OPHTHALMIC | Status: DC
  Filled 2013-04-04: qty 2.5

## 2013-04-04 MED ORDER — POTASSIUM CHLORIDE IN NACL 20-0.9 MEQ/L-% IV SOLN
INTRAVENOUS | Status: DC
  Administered 2013-04-04 – 2013-04-05 (×2): via INTRAVENOUS

## 2013-04-04 MED ORDER — LEVOTHYROXINE SODIUM 75 MCG PO TABS
150.0000 ug | ORAL_TABLET | Freq: Every day | ORAL | Status: DC
  Administered 2013-04-05 – 2013-04-06 (×2): 150 ug via ORAL
  Filled 2013-04-04 (×2): qty 2

## 2013-04-04 MED ORDER — SODIUM CHLORIDE 0.9 % IV BOLUS (SEPSIS)
500.0000 mL | Freq: Once | INTRAVENOUS | Status: AC
  Administered 2013-04-04: 500 mL via INTRAVENOUS

## 2013-04-04 MED ORDER — POLYETHYLENE GLYCOL 3350 17 G PO PACK
17.0000 g | PACK | Freq: Every day | ORAL | Status: DC
  Administered 2013-04-05: 17 g via ORAL
  Filled 2013-04-04 (×2): qty 1

## 2013-04-04 MED ORDER — ENOXAPARIN SODIUM 30 MG/0.3ML ~~LOC~~ SOLN: 30.0000 mg | SUBCUTANEOUS | Status: DC

## 2013-04-04 MED ORDER — ONDANSETRON HCL 4 MG/2ML IJ SOLN: 4.0000 mg | INTRAMUSCULAR | Status: DC | PRN

## 2013-04-04 MED ORDER — PANTOPRAZOLE SODIUM 40 MG PO TBEC
40.0000 mg | DELAYED_RELEASE_TABLET | Freq: Every day | ORAL | Status: DC
  Administered 2013-04-05 – 2013-04-06 (×2): 40 mg via ORAL
  Filled 2013-04-04 (×2): qty 1

## 2013-04-04 MED ORDER — LORATADINE 10 MG PO TABS
10.0000 mg | ORAL_TABLET | Freq: Every day | ORAL | Status: DC
  Administered 2013-04-05 – 2013-04-06 (×2): 10 mg via ORAL
  Filled 2013-04-04 (×2): qty 1

## 2013-04-04 MED ORDER — INSULIN ASPART 100 UNIT/ML ~~LOC~~ SOLN
0.0000 [IU] | Freq: Every day | SUBCUTANEOUS | Status: DC
  Administered 2013-04-05: 2 [IU] via SUBCUTANEOUS

## 2013-04-04 MED ORDER — FLEET ENEMA 7-19 GM/118ML RE ENEM: 1.0000 | ENEMA | Freq: Once | RECTAL | Status: AC | PRN

## 2013-04-04 MED ORDER — SIMVASTATIN 10 MG PO TABS
10.0000 mg | ORAL_TABLET | Freq: Every day | ORAL | Status: DC
  Administered 2013-04-05: 10 mg via ORAL
  Filled 2013-04-04: qty 1

## 2013-04-04 NOTE — ED Notes (Signed)
States that her blood pressure is low and her blood sugar is high.  Denies chest pain.

## 2013-04-04 NOTE — H&P (Signed)
[facility] History and Physical  ERIKO ECONOMOS  ZOX:[id number]  DOB: [birth year]   DOA: 04/04/2013   PCP:   Cassell Smiles., MD  Cardiologist: Zoila Shutter, M.D. [facility]ologist:  Coralyn Helling M.D.  Chief Complaint:  Dizziness while in church today  HPI: [name] is a 78 y.o. female.   Elderly Caucasian lady with multiple medical problems, it discharged from [facility] less than 3 weeks ago after treatment for CHF exacerbation obstructive sleep apnea/COPD and chronic respiratory failure. She was diuresed and lost 11 pounds; 215 pounds was established as her dry weight.   Since discharge she's been monitored by home blood has been taken we regularly over the past week to 10 days her BUN and creatinine is been noted to be rising steadily and her weight crease into 191 pounds. She was asymptomatic but records show a telephone advice to reduce her Demadex from 60 mg twice daily to 40 mg twice daily. The patient apparently did not understand this instruction and has continued to take the original dose. While in church this morning she suddenly became dizzy and felt ill her daughter saw she was not well and took her home as she refused to come to hospital. At home her blood sugar was noted to be unusually elevated and she was persuaded to come to the hospital.  In the emergency room she was found to be hypotensive and her BUN and creatinine markedly elevated above baseline.   her only blood thinner is a baby aspirin, but she did have an prolonged nosebleed 3 days ago, swallowed a lot of blood, and reported black stool yesterday.  She has difficulty sleeping because having to get up 4-6 times per night to pass urine, after taking Demadex about this 3 to 4 PM in the afternoon.  Rewiew of Systems:   All systems negative except as marked bold or noted in the HPI;  Constitutional:    malaise, fever and chills. ;  Eyes:   eye pain, redness and  discharge. ;  ENMT:   ear pain, hoarseness, nasal congestion, PND, sinus pressure and sore throat. ;  Cardiovascular:    chest pain, palpitations, diaphoresis, dyspnea and peripheral edema.  Respiratory:   cough, hemoptysis, wheezing and stridor. ;  Gastrointestinal:  nausea, vomiting, diarrhea, constipation, abdominal pain, melena saturday, blood in stool, hematemesis, jaundice and rectal bleeding. unusual weight loss..   Genitourinary:    frequency, dysuria, incontinence,flank pain and hematuria; nocutria 5-6/night because of diuretic Musculoskeletal:   Shoulder arthritis; back pain and neck pain.  swelling and trauma.;  Skin: .  pruritus, rash, abrasions, bruising and skin lesion.; ulcerations Neuro:    headache, lightheadedness and neck stiffness.  weakness, altered level of consciousness, altered mental status, extremity weakness, burning feet, involuntary movement, seizure and syncope.  Psych:    anxiety, depression, insomnia, tearfulness, panic attacks, hallucinations, paranoia, suicidal or homicidal ideation    Past Medical History  Diagnosis Date  . Diabetes mellitus   . Hypertension   . Shingles   . GERD (gastroesophageal reflux disease)   . Abnormal heart rhythms   . Thyroid disease     hypthyroidism  . Pulmonary hypertension, moderate to severe     No evidence of CAD on Cath 2011  . Obesity, morbid (more than 100 lbs over ideal weight or BMI > 40)   . Chronic respiratory failure with hypoxia 01/23/2012    Related to OHS  . Glaucoma   . CHF (  congestive heart failure)     Echo at [facility] 10/14/2012 see notes  . Acute on chronic renal failure     Past Surgical History  Procedure Laterality Date  . Tubal ligation    . Ovary removed    . Knee arthroscopy      left  . Cardiovascular stress test  09/15/2006    EF 81%; normal perfusion all regions; LV normal in size; no scintigraphic evidence of inducible myocardial ischemia  . Cardiac catheterization  02/21/2010    EF 65%; PA  pressure 86/21 with mean of 44; normal coronary arteries, primary pumonary hypertension; no significant wall motion abnormalities    Medications:  HOME MEDS: Prior to Admission medications   Medication Sig Start Date End Date Taking? Authorizing Provider  ALPRAZolam Prudy Feeler) 0.5 MG tablet Take 0.5 mg by mouth 2 (two) times daily. May take up to 4 times daily as prescribed   Yes Historical Provider, MD  aspirin 81 MG EC tablet Take 81 mg by mouth every morning.  10/20/12  Yes Brittainy Simmons, PA-C  bisacodyl (DULCOLAX) 10 MG suppository Place 10 mg rectally as needed for constipation.   Yes Historical Provider, MD  brimonidine (ALPHAGAN P) 0.1 % SOLN Place 1 drop into both eyes daily.   Yes Historical Provider, MD  carvedilol (COREG) 3.125 MG tablet Take 2 tablets (6.25 mg total) by mouth 2 (two) times daily with a meal. 12/28/12  Yes Annett Gula, MD  diltiazem (CARDIZEM CD) 120 MG 24 hr capsule Take 120 mg by mouth every morning.  01/19/13  Yes Chrystie Nose, MD  docusate sodium (STOOL SOFTENER) 100 MG capsule Take 100 mg by mouth 2 (two) times daily.   Yes Historical Provider, MD  ezetimibe (ZETIA) 10 MG tablet Take 10 mg by mouth at bedtime.     Yes Historical Provider, MD  fexofenadine (ALLEGRA) 180 MG tablet Take 180 mg by mouth every morning.   Yes Historical Provider, MD  glipiZIDE (GLUCOTROL) 10 MG tablet Take 10 mg by mouth 2 (two) times daily before a meal.     Yes Historical Provider, MD  lansoprazole (PREVACID) 30 MG capsule Take 30 mg by mouth every morning.    Yes Historical Provider, MD  levothyroxine (SYNTHROID, LEVOTHROID) 150 MCG tablet Take 150 mcg by mouth daily before breakfast.    Yes Historical Provider, MD  linagliptin (TRADJENTA) 5 MG TABS tablet Take 5 mg by mouth every morning.    Yes Historical Provider, MD  losartan (COZAAR) 25 MG tablet Take 25 mg by mouth every morning.   Yes Historical Provider, MD  oxyCODONE-acetaminophen (PERCOCET/ROXICET) 5-325 MG per tablet  Take 1 tablet by mouth 2 (two) times daily. May take 1 additional dose if needed for pain   Yes Historical Provider, MD  polyethylene glycol (MIRALAX / GLYCOLAX) packet Take 17 g by mouth daily.   Yes Historical Provider, MD  potassium chloride SA (K-DUR,KLOR-CON) 20 MEQ tablet Take 1 tablet (20 mEq total) by mouth 2 (two) times daily. 03/19/13  Yes Lonia Blood, MD  pravastatin (PRAVACHOL) 20 MG tablet Take 20 mg by mouth at bedtime.    Yes Historical Provider, MD  torsemide (DEMADEX) 20 MG tablet Take 60 mg by mouth 2 (two) times daily. 03/19/13  Yes Historical Provider, MD  Travoprost, BAK Free, (TRAVATAN) 0.004 % SOLN ophthalmic solution Place 1 drop into both eyes at bedtime.   Yes Historical Provider, MD     Allergies:  Allergies  Allergen Reactions  .  Darvocet (Propoxyphene-Acetaminophen) Nausea And Vomiting  . Diphenhydramine Hcl Other (See Comments)    Glaucoma prevents patient from taking this medication   . Aloe Vera Rash  . Penicillins Swelling and Rash    Social History:   reports that she has never smoked. Her smokeless tobacco use includes Snuff. She reports that she does not drink alcohol or use illicit drugs.  Family History: Family History  Problem Relation Age of Onset  . Heart attack Father 32  . Heart disease Mother 44  . Diabetes Mother   . Lymphoma Daughter   . Diabetes      siblings, son  . Coronary artery disease Brother   . Diabetes Brother   . Stroke Brother   . Diabetes Brother   . Diabetes Sister   . Hypertension Sister   . Diabetes Sister   . Heart disease Sister   . Hypertension Child   . Sleep apnea Child   . Hypertension Child   . Hypertension Child   . Arthritis/Rheumatoid Child   . Hyperlipidemia Child   . Hypertension Child      Physical Exam: Filed Vitals:   04/04/13 1830 04/04/13 1845 04/04/13 1913 04/04/13 1959  BP: 88/43 97/47 102/48 121/57  Pulse: 58 59 63 66  Temp:   97 F (36.1 C) 97.7 F (36.5 C)  TempSrc:    Axillary Oral  Resp: 15 18 16 16   Height:    5\' 1"  (1.549 m)  Weight:    90.357 kg (199 lb 3.2 oz)  SpO2: 99% 97% 100% 97%   Blood pressure 121/57, pulse 66, temperature 97.7 F (36.5 C), temperature source Oral, resp. rate 16, height 5\' 1"  (1.549 m), weight 90.357 kg (199 lb 3.2 oz), SpO2 97.00%. Body mass index is 37.66 kg/(m^2).   GEN:  Pleasant elderly obese Caucasian lady lying bed in no acute distress; cooperative with exam PSYCH:  alert and oriented x4;  neither anxious nor depressed; affect is appropriate. HEENT: Mucous membranes pink dry and anicteric; PERRLA; EOM intact; no cervical lymphadenopathy nor thyromegaly or carotid bruit; no JVD; Breasts:: Not examined CHEST WALL: No tenderness CHEST: Normal respiration, clear to auscultation bilaterally HEART: Regular rate and rhythm; no murmurs rubs or gallops BACK: Marked Markedkyphosis no scoliosis; no CVA tenderness ABDOMEN: Obese, soft non-tender; no masses, no organomegaly, normal abdominal bowel sounds; ; no intertriginous candida. Rectal Exam: Not done EXTREMITIES: age-appropriate arthropathy of the hands and knees; trace edema; no ulcerations. Genitalia: not examined PULSES: 2+ and symmetric SKIN: Normal hydration no rash or ulceration CNS: Cranial nerves 2-12 grossly intact no focal lateralizing neurologic deficit   Labs on Admission:  Basic Metabolic Panel:  Recent Labs Lab 03/29/13 1127 04/02/13 1505 04/04/13 1618  NA 134* 134* 125*  K 3.4* 2.9* 2.9*  CL 78* 77* 75*  CO2 42* 46* 39*  GLUCOSE 199* 186* 264*  BUN 67* 77* 105*  CREATININE 1.71* 1.43* 1.89*  CALCIUM 10.6* 10.6* 10.2   Liver Function Tests:  Recent Labs Lab 04/04/13 1618  AST 28  ALT 18  ALKPHOS 175*  BILITOT 0.6  PROT 6.8  ALBUMIN 3.9   No results found for this basename: LIPASE, AMYLASE,  in the last 168 hours No results found for this basename: AMMONIA,  in the last 168 hours CBC:  Recent Labs Lab 04/04/13 1618  WBC 10.2   NEUTROABS 7.8*  HGB 13.1  HCT 38.7  MCV 85.4  PLT 138*   Cardiac Enzymes:  Recent Labs Lab 04/04/13 1618  TROPONINI <0.30   BNP: No components found with this basename: POCBNP,  D-dimer: No components found with this basename: D-DIMER,  CBG:  Recent Labs Lab 04/04/13 1528  GLUCAP 293*    Radiological Exams on Admission: Dg Chest Port 1 View  04/04/2013   *RADIOLOGY REPORT*  Clinical Data: Chest pain.  PORTABLE CHEST - 1 VIEW  Comparison: 03/15/2013.  Findings: Cardiomegaly.  Central pulmonary vascular prominence.  Interval improved aeration lung bases.  No segmental consolidation or gross pneumothorax.  The patient would eventually benefit from follow-up two-view chest with cardiac leads removed.  IMPRESSION: Cardiomegaly.  Central pulmonary vascular prominence.  Interval improved aeration lung bases.   Original Report Authenticated By: Lacy Duverney, M.D.    EKG: Independently reviewed. Sinus rhythm, first degree AV block, prolonged QT   Assessment/Plan  Active Problems:   Chronic respiratory failure with hypoxia   DM2 (diabetes mellitus, type 2)   Unspecified hypothyroidism   Obesity, morbid (more than 100 lbs over ideal weight or BMI > 40)   Pulmonary hypertension, severe; Severe by Echo 02/1713   Hypotension   Diastolic CHF, chronic- EF 65-70% May 2014   Volume depletion   Weakness   Acute renal failure    PLAN: We'll admit this lady for hydration, and hold her diuretic. Is no question she is dehydrated although a large part of her BUN elevation is likely due to swallowed blood Consider consulting a bowel cardiology or having a telephone conversation with her primary cardiologist to optimize her diuretic. Check magnesium level; recheck chemistries in the morning Continue glipizide along with sliding scale insulin Cycle cardiac enzymes because of presyncopal Daily orthostatics confirm hydration Other plans as per orders.  Code Status: Full code Family  Communication:  Plans discuss with patient and family at bedside Disposition Plan: Likely home in a day or 2   Ulmer Degen Nocturnist [facility] Pager [phone]   04/04/2013, 8:46 PM

## 2013-04-04 NOTE — ED Provider Notes (Signed)
CSN: [id number]     Arrival date & time 04/04/13  1522 History  This chart was scribed for Hilario Quarry, MD by Bennett Scrape, ED Scribe. This patient was seen in room APA04/APA04 and the patient's care was started at 3:40 PM..   Chief Complaint  Patient presents with  . Shortness of Breath  . Hyperglycemia    Patient is a 78 y.o. female presenting with weakness. The history is provided by the patient. No language interpreter was used.  Weakness This is a recurrent problem. The current episode started 6 to 12 hours ago. The problem occurs constantly. The problem has been gradually worsening. Associated symptoms include headaches and shortness of breath (chronic, no changes). Pertinent negatives include no chest pain and no abdominal pain. Nothing aggravates the symptoms. Nothing relieves the symptoms. She has tried nothing for the symptoms.    HPI Comments: [name] is a 78 y.o. female who presents to the Emergency Department complaining of  weakness that started this morning. She states that she checked her BP this morning after the onset and noted hypotension. She is unable to provide a BP. She also reports hyperglycemia of 296 this morning. She states that has been eating and drinking less than normal since the onset. She also reports worsening of chronic neck and shoulder pain this morning, nausea andHAs but state that these are not her main concern. The HA started upon waking this morning and is frontally located. She rates her HA as mild currently and reports that she had HAs during her admission states that this is not her main concern. She denies emesis. She has a recent admission for CHF and SOB. She states that she the same with her breathing at the time when she was discharged. She reports that she was taken off of 2 fluid pills around July 23rd by her Pulmonologist. She is still currently on Demadex.  Per family, pt was c/o right shoulder blade pain and SOB today which is  the main reason why they brought her in. Pt denies this.  Dr. Vonita Moss is Pulmonary [facility] Dr. Deborah Chalk is Cards.  [place] is PCP  Past Medical History  Diagnosis Date  . Diabetes mellitus   . Hypertension   . Shingles   . GERD (gastroesophageal reflux disease)   . Abnormal heart rhythms   . Thyroid disease     hypthyroidism  . Pulmonary hypertension, moderate to severe     No evidence of CAD on Cath 2011  . Obesity, morbid (more than 100 lbs over ideal weight or BMI > 40)   . Chronic respiratory failure with hypoxia 01/23/2012    Related to OHS  . Glaucoma   . CHF (congestive heart failure)     Echo at [facility] 10/14/2012 see notes  . Acute on chronic renal failure    Past Surgical History  Procedure Laterality Date  . Tubal ligation    . Ovary removed    . Knee arthroscopy      left  . Cardiovascular stress test  09/15/2006    EF 81%; normal perfusion all regions; LV normal in size; no scintigraphic evidence of inducible myocardial ischemia  . Cardiac catheterization  02/21/2010    EF 65%; PA pressure 86/21 with mean of 44; normal coronary arteries, primary pumonary hypertension; no significant wall motion abnormalities   Family History  Problem Relation Age of Onset  . Heart attack Father 17  . Heart disease Mother 73  . Diabetes Mother   .  Lymphoma Daughter   . Diabetes      siblings, son  . Coronary artery disease Brother   . Diabetes Brother   . Stroke Brother   . Diabetes Brother   . Diabetes Sister   . Hypertension Sister   . Diabetes Sister   . Heart disease Sister   . Hypertension Child   . Sleep apnea Child   . Hypertension Child   . Hypertension Child   . Arthritis/Rheumatoid Child   . Hyperlipidemia Child   . Hypertension Child    History  Substance Use Topics  . Smoking status: Never Smoker   . Smokeless tobacco: Current User    Types: Snuff  . Alcohol Use: No   No OB history provided.  Review of Systems  Constitutional: Negative for  fever and chills.  HENT: Positive for neck pain (chronic).   Eyes: Negative for visual disturbance.  Respiratory: Positive for shortness of breath (chronic, no changes).   Cardiovascular: Negative for chest pain.  Gastrointestinal: Negative for abdominal pain.  Neurological: Positive for weakness and headaches.  All other systems reviewed and are negative.    Allergies  Darvocet; Diphenhydramine hcl; Aloe vera; and Penicillins  Home Medications   Current Outpatient Rx  Name  Route  Sig  Dispense  Refill  . ALPRAZolam (XANAX) 0.5 MG tablet   Oral   Take 0.5 mg by mouth 2 (two) times daily.          Marland Kitchen aspirin 81 MG EC tablet   Oral   Take 81 mg by mouth daily.         . brimonidine (ALPHAGAN P) 0.1 % SOLN   Both Eyes   Place 1 drop into both eyes daily.         . carvedilol (COREG) 3.125 MG tablet   Oral   Take 2 tablets (6.25 mg total) by mouth 2 (two) times daily with a meal.   60 tablet   1   . diltiazem (CARDIZEM CD) 120 MG 24 hr capsule   Oral   Take 120 mg by mouth daily.         Marland Kitchen ezetimibe (ZETIA) 10 MG tablet   Oral   Take 10 mg by mouth at bedtime.           Marland Kitchen glipiZIDE (GLUCOTROL) 10 MG tablet   Oral   Take 10 mg by mouth 2 (two) times daily before a meal.           . lansoprazole (PREVACID) 30 MG capsule   Oral   Take 30 mg by mouth every morning.          Marland Kitchen levothyroxine (SYNTHROID, LEVOTHROID) 150 MCG tablet   Oral   Take 150 mcg by mouth daily before breakfast.          . linagliptin (TRADJENTA) 5 MG TABS tablet   Oral   Take 5 mg by mouth daily.         Marland Kitchen oxyCODONE-acetaminophen (PERCOCET/ROXICET) 5-325 MG per tablet   Oral   Take 1 tablet by mouth 2 (two) times daily. May take 1 additional dose if needed for pain         . potassium chloride SA (K-DUR,KLOR-CON) 20 MEQ tablet   Oral   Take 1 tablet (20 mEq total) by mouth 2 (two) times daily.         . pravastatin (PRAVACHOL) 20 MG tablet   Oral   Take 20 mg by  mouth at  bedtime.          . torsemide (DEMADEX) 20 MG tablet   Oral   Take 60 mg by mouth 2 (two) times daily.         . Travoprost, BAK Free, (TRAVATAN) 0.004 % SOLN ophthalmic solution   Both Eyes   Place 1 drop into both eyes at bedtime.          Triage Vitals: BP 97/43  Pulse 71  Temp(Src) 97.6 F (36.4 C) (Oral)  Resp 20  Wt 191 lb (86.637 kg)  BMI 36.11 kg/m2  SpO2 97%  Physical Exam  Nursing note and vitals reviewed. Constitutional: She is oriented to person, place, and time. She appears well-developed and well-nourished. No distress.  HENT:  Head: Normocephalic and atraumatic.  Right Ear: External ear normal.  Left Ear: External ear normal.  Nose: Nose normal.  Mouth/Throat: Oropharynx is clear and moist.  Eyes: Conjunctivae and EOM are normal. Pupils are equal, round, and reactive to light.  Neck: Normal range of motion. No tracheal deviation present.  Cardiovascular: Normal rate, regular rhythm, normal heart sounds and intact distal pulses.  Exam reveals no gallop.   No murmur heard. Pulmonary/Chest: Effort normal and breath sounds normal. No respiratory distress.  Good bilateral breath sounds  Abdominal: Soft. There is no tenderness.  Musculoskeletal: Normal range of motion. She exhibits no edema.  Neurological: She is alert and oriented to person, place, and time. No cranial nerve deficit.  Skin: Skin is warm and dry.  Psychiatric: She has a normal mood and affect. Her behavior is normal. Judgment and thought content normal.    ED Course   Medications  sodium chloride 0.9 % bolus 500 mL (500 mLs Intravenous New Bag/Given 04/04/13 1610)    DIAGNOSTIC STUDIES: Oxygen Saturation is 99% on 6L [place], normal by my interpretation.    COORDINATION OF CARE: 3:46 PM-Discussed treatment plan which includes CXR, CBC panel, CMP and troponin with pt at bedside and pt agreed to plan.    Procedures (including critical care time)  Labs Reviewed  GLUCOSE,  CAPILLARY - Abnormal; Notable for the following:    Glucose-Capillary 293 (*)    All other components within normal limits   No results found. No diagnosis found. Results for orders placed during the hospital encounter of 04/04/13  GLUCOSE, CAPILLARY      Result Value Range   Glucose-Capillary 293 (*) 70 - 99 mg/dL  CBC WITH DIFFERENTIAL      Result Value Range   WBC 10.2  4.0 - 10.5 K/uL   RBC 4.53  3.87 - 5.11 MIL/uL   Hemoglobin 13.1  12.0 - 15.0 g/dL   HCT 86.5  78.4 - 69.6 %   MCV 85.4  78.0 - 100.0 fL   MCH 28.9  26.0 - 34.0 pg   MCHC 33.9  30.0 - 36.0 g/dL   RDW 29.5  28.4 - 13.2 %   Platelets 138 (*) 150 - 400 K/uL   Neutrophils Relative % 77  43 - 77 %   Neutro Abs 7.8 (*) 1.7 - 7.7 K/uL   Lymphocytes Relative 14  12 - 46 %   Lymphs Abs 1.4  0.7 - 4.0 K/uL   Monocytes Relative 8  3 - 12 %   Monocytes Absolute 0.8  0.1 - 1.0 K/uL   Eosinophils Relative 1  0 - 5 %   Eosinophils Absolute 0.1  0.0 - 0.7 K/uL   Basophils Relative 0  0 - 1 %  Basophils Absolute 0.0  0.0 - 0.1 K/uL  COMPREHENSIVE METABOLIC PANEL      Result Value Range   Sodium 125 (*) 135 - 145 mEq/L   Potassium 2.9 (*) 3.5 - 5.1 mEq/L   Chloride 75 (*) 96 - 112 mEq/L   CO2 39 (*) 19 - 32 mEq/L   Glucose, Bld 264 (*) 70 - 99 mg/dL   BUN 161 (*) 6 - 23 mg/dL   Creatinine, Ser 0.96 (*) 0.50 - 1.10 mg/dL   Calcium 04.5  8.4 - 40.9 mg/dL   Total Protein 6.8  6.0 - 8.3 g/dL   Albumin 3.9  3.5 - 5.2 g/dL   AST 28  0 - 37 U/L   ALT 18  0 - 35 U/L   Alkaline Phosphatase 175 (*) 39 - 117 U/L   Total Bilirubin 0.6  0.3 - 1.2 mg/dL   GFR calc non Af Amer 25 (*) >90 mL/min   GFR calc Af Amer 29 (*) >90 mL/min  TROPONIN I      Result Value Range   Troponin I <0.30  <0.30 ng/mL  PRO B NATRIURETIC PEPTIDE      Result Value Range   Pro B Natriuretic peptide (BNP) 2372.0 (*) 0 - 450 pg/mL   DDg Chest Port 1 View  04/04/2013   *RADIOLOGY REPORT*  Clinical Data: Chest pain.  PORTABLE CHEST - 1 VIEW   Comparison: 03/15/2013.  Findings: Cardiomegaly.  Central pulmonary vascular prominence.  Interval improved aeration lung bases.  No segmental consolidation or gross pneumothorax.  The patient would eventually benefit from follow-up two-view chest with cardiac leads removed.  IMPRESSION: Cardiomegaly.  Central pulmonary vascular prominence.  Interval improved aeration lung bases.   Original Report Authenticated By: Lacy Duverney, M.D.   Filed Vitals:   04/04/13 1808  BP: 94/44  Pulse: 62  Temp:   Resp: 14    Date: 04/04/2013  Rate: 72  Rhythm: normal sinus rhythm  QRS Axis: right  Intervals: normal  ST/T Wave abnormalities: nonspecific ST changes  Conduction Disutrbances:none  Narrative Interpretation:   Old EKG Reviewed: unchanged from first prior  MDM  78 y.o. Female with multiple medical problems recently discharged after admission for chf.  She was aggresively diuresed and now appears dehydrated with borderline low blood pressure with symptomatic weakness, hyponatremia, hypokalemia, and increased creatinine.  Patient will need gently rehydration as she continues with elevated bnp and increased markings although she appears volume depleted.   Discussed with Dr. Irene Limbo and plan admission telemetry.   I personally performed the services described in this documentation, which was scribed in my presence. The recorded information has been reviewed and considered.   Hilario Quarry, MD 04/04/13 [phone]

## 2013-04-05 DIAGNOSIS — I2789 Other specified pulmonary heart diseases: Secondary | ICD-10-CM

## 2013-04-05 DIAGNOSIS — I509 Heart failure, unspecified: Secondary | ICD-10-CM

## 2013-04-05 DIAGNOSIS — E876 Hypokalemia: Secondary | ICD-10-CM

## 2013-04-05 DIAGNOSIS — D696 Thrombocytopenia, unspecified: Secondary | ICD-10-CM | POA: Diagnosis present

## 2013-04-05 DIAGNOSIS — E871 Hypo-osmolality and hyponatremia: Secondary | ICD-10-CM | POA: Diagnosis present

## 2013-04-05 LAB — CBC
MCH: 28.9 pg (ref 26.0–34.0)
MCHC: 34.1 g/dL (ref 30.0–36.0)
Platelets: 130 10*3/uL — ABNORMAL LOW (ref 150–400)
RBC: 4.39 MIL/uL (ref 3.87–5.11)

## 2013-04-05 LAB — BASIC METABOLIC PANEL
Calcium: 9.7 mg/dL (ref 8.4–10.5)
GFR calc Af Amer: 54 mL/min — ABNORMAL LOW (ref >90)
GFR calc non Af Amer: 47 mL/min — ABNORMAL LOW (ref >90)
Glucose, Bld: 89 mg/dL (ref 70–99)
Sodium: 134 mEq/L — ABNORMAL LOW (ref 135–145)

## 2013-04-05 LAB — GLUCOSE, CAPILLARY
Glucose-Capillary: 101 mg/dL — ABNORMAL HIGH (ref 70–99)
Glucose-Capillary: 220 mg/dL — ABNORMAL HIGH (ref 70–99)

## 2013-04-05 LAB — TSH: TSH: 0.453 u[IU]/mL (ref 0.350–4.500)

## 2013-04-05 MED ORDER — BIOTENE DRY MOUTH MT LIQD
15.0000 mL | Freq: Two times a day (BID) | OROMUCOSAL | Status: DC
  Administered 2013-04-05 – 2013-04-06 (×4): 15 mL via OROMUCOSAL

## 2013-04-05 MED ORDER — SODIUM CHLORIDE 0.9 % IV SOLN
INTRAVENOUS | Status: AC
  Administered 2013-04-05: 09:00:00 via INTRAVENOUS

## 2013-04-05 MED ORDER — POTASSIUM CHLORIDE CRYS ER 20 MEQ PO TBCR
40.0000 meq | EXTENDED_RELEASE_TABLET | Freq: Four times a day (QID) | ORAL | Status: AC
  Administered 2013-04-05 (×4): 40 meq via ORAL
  Filled 2013-04-05 (×2): qty 1
  Filled 2013-04-05 (×3): qty 2

## 2013-04-05 NOTE — Progress Notes (Signed)
INITIAL NUTRITION ASSESSMENT  DOCUMENTATION CODES Per approved criteria  -Obesity Unspecified   INTERVENTION: RD will follow for nutrition care   NUTRITION DIAGNOSIS:  Inadequate oral intake related to acute renal failure as evidenced by electrolyte derrangement, overdiuresis.   Goal: Pt to meet >/= 90% of their estimated nutrition needs   Monitor:  Po intake, labs and wt trends  Reason for Assessment: Malnutrition Screen score = 3  78 y.o. female   ASSESSMENT: Patient Active Problem List   Diagnosis Date Noted  . Hypokalemia 04/05/2013  . Hyponatremia 04/05/2013  . Volume depletion 04/04/2013  . Weakness 04/04/2013  . Acute renal failure 04/04/2013  . Hypotension 12/25/2012  . Diastolic CHF, chronic- EF 65-70% May 2014 12/25/2012  . Unspecified hypothyroidism 10/14/2012  . Normal coronary arteries at cath 2011 10/14/2012  . Obesity, morbid (more than 100 lbs over ideal weight or BMI > 40)   . Pulmonary hypertension, severe; Severe by Echo 02/1713   . 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 DM2 (diabetes mellitus, type 2) 10/13/2012  . Chronic respiratory failure with hypoxia 01/23/2012   Pt recently tx due to CHF exacerbation. Presents to ED volume depleted. Pt reports  good appetite but pt says she is unable to stand long enough to prepare her own food. Has handicap brother in the house as well. She doesn't drive therefore depends on others for grocery shopping. Discussed with pt care manager. She currently has an aid during lunch hours. She would benefit from additional assistance if available to help insure pt is able to access adequate nutrition at home.  She does not meet criteria for malnutrition at this time but is at risk due to her social circumstances and chronic illnesses.  Height: Ht Readings from Last 1 Encounters:  04/04/13 5\' 1"  (1.549 m)    Weight: Wt Readings from Last 1 Encounters:   04/05/13 200 lb 4.8 oz (90.855 kg)    Ideal Body Weight: 105# (47.7 kg)  % Ideal Body Weight: 190%  Wt Readings from Last 10 Encounters:  04/05/13 200 lb 4.8 oz (90.855 kg)  04/01/13 191 lb (86.637 kg)  03/26/13 199 lb 6.4 oz (90.447 kg)  03/15/13 218 lb 11.1 oz (99.2 kg)  02/22/13 223 lb 3.2 oz (101.243 kg)  01/19/13 220 lb (99.791 kg)  01/16/13 219 lb 1.6 oz (99.383 kg)  12/28/12 226 lb 8 oz (102.74 kg)  10/20/12 216 lb 7.9 oz (98.2 kg)  07/02/12 227 lb 6.4 oz (103.148 kg)    Usual Body Weight: 220-227#  % Usual Body Weight: 91%  BMI:  Body mass index is 37.87 kg/(m^2).Obesity Class II  Estimated Nutritional Needs: Kcal: 1400-1600 Protein: 70-80 gr Fluid: per MD goals   Skin: No issues noted  Diet Order: Carb Control po's 75% (breakfast and lunch)  EDUCATION NEEDS: -Education needs addressed   Intake/Output Summary (Last 24 hours) at 04/05/13 1002 Last data filed at 04/05/13 0910  Gross per 24 hour  Intake    120 ml  Output   1150 ml  Net  -1030 ml    Last BM: 04/03/13  Labs:   Recent Labs Lab 04/02/13 1505 04/04/13 1608 04/04/13 1618 04/05/13 0705  NA 134*  --  125* 134*  K 2.9*  --  2.9* 2.7*  CL 77*  --  75* 86*  CO2 46*  --  39* 42*  BUN 77*  --  105* 86*  CREATININE 1.43*  --  1.89* 1.12*  CALCIUM 10.6*  --  10.2 9.7  MG  --  2.4  --   --   GLUCOSE 186*  --  264* 89    CBG (last 3)   Recent Labs  04/04/13 1528 04/04/13 2238 04/05/13 0804  GLUCAP 293* 163* 101*    Scheduled Meds: . ALPRAZolam  0.5 mg Oral BID  . antiseptic oral rinse  15 mL Mouth Rinse BID  . aspirin EC  81 mg Oral q morning - 10a  . brimonidine  1 drop Both Eyes Daily  . docusate sodium  100 mg Oral BID  . enoxaparin (LOVENOX) injection  30 mg Subcutaneous Q24H  . ezetimibe  10 mg Oral QHS  . glipiZIDE  10 mg Oral BID AC  . insulin aspart  0-5 Units Subcutaneous QHS  . insulin aspart  0-9 Units Subcutaneous TID WC  . levothyroxine  150 mcg Oral QAC  breakfast  . loratadine  10 mg Oral Daily  . oxyCODONE-acetaminophen  1 tablet Oral BID  . pantoprazole  40 mg Oral Daily  . polyethylene glycol  17 g Oral Daily  . potassium chloride  40 mEq Oral QID  . simvastatin  10 mg Oral q1800  . Travoprost (BAK Free)  1 drop Both Eyes QHS    Continuous Infusions: . sodium chloride 75 mL/hr at 04/05/13 1610    Past Medical History  Diagnosis Date  . Diabetes mellitus   . Hypertension   . Shingles   . GERD (gastroesophageal reflux disease)   . Abnormal heart rhythms   . Thyroid disease     hypthyroidism  . Pulmonary hypertension, moderate to severe     No evidence of CAD on Cath 2011  . Obesity, morbid (more than 100 lbs over ideal weight or BMI > 40)   . Chronic respiratory failure with hypoxia 01/23/2012    Related to OHS  . Glaucoma   . CHF (congestive heart failure)     Echo at [facility] 10/14/2012 see notes  . Acute on chronic renal failure     Past Surgical History  Procedure Laterality Date  . Tubal ligation    . Ovary removed    . Knee arthroscopy      left  . Cardiovascular stress test  09/15/2006    EF 81%; normal perfusion all regions; LV normal in size; no scintigraphic evidence of inducible myocardial ischemia  . Cardiac catheterization  02/21/2010    EF 65%; PA pressure 86/21 with mean of 44; normal coronary arteries, primary pumonary hypertension; no significant wall motion abnormalities    Royann Shivers MS,RD,LDN,CSG Office: [phone] Pager: [phone]

## 2013-04-05 NOTE — Progress Notes (Signed)
[facility] PROGRESS NOTE  [name] ZOX:[id number] DOB: [birth year] DOA: 04/04/2013 PCP: Cassell Smiles., MD  Assessment/Plan: Acute renal failure: likely related to over diuresis. Improved this am. Will continue to hold demadex and other nephrotoxins. Gentle IV hydration with close watch on volume.   Volume depletion: related to #1. Improved but remains orthostatic. Will continue gentle IV hydration and recheck orthostatic vital signs this afternoon. Taking po's without problem.    Hypotension: related to #2. Holding diuretics and other antihypertensive meds. Gently hydrating with IV fluids. Improved. Remains orthostatic.     Diastolic CHF, chronic- EF 65-70% May 2014 : See #2. Will need diuretic adjusted at discharge. Wt 200lbs up from 199lbs yesterday. ProBNP trending down. Will watch closely given need for fluids.   Chronic respiratory failure with hypoxia: stable at baseline. Monitor.   DM2 (diabetes mellitus, type 2): fair control. A1C in process. Will continue SSI    Unspecified hypothyroidism:TSH in process. Continue home medication  Obesity, morbid (more than 100 lbs over ideal weight or BMI > 40)  Pulmonary hypertension, severe; Severe by Echo 02/1713   Hypokalemia: due to diuretics. Will hold diuretics. Replace and recheck.   Hyponatremia: related to #2. Gently hydrate. Trending up. Will continue IV fluids gently. Recheck in am  Thrombocytopenia: chart review indicates hx of same and current level higher than what appears to be baseline. No s/sx bleeding. Will monitor. May need OP follow up  Code Status: full Family Communication: none present Disposition Plan: home when ready hopefully tomorro   Consultants:  none  Procedures:  none  Antibiotics:  none  HPI/Subjective: Awake alert. Denies pain discomfort or SOB  Objective: Filed Vitals:   04/05/13 0545  BP: 77/50  Pulse: 76  Temp:   Resp: 16    Intake/Output Summary (Last 24 hours)  at 04/05/13 0947 Last data filed at 04/05/13 0910  Gross per 24 hour  Intake    120 ml  Output   1150 ml  Net  -1030 ml   Filed Weights   04/04/13 1530 04/04/13 1959 04/05/13 0543  Weight: 191 lb (86.637 kg) 199 lb 3.2 oz (90.357 kg) 200 lb 4.8 oz (90.855 kg)    Exam:   General:  Obese NAD  Cardiovascular: RRR No MGR No LE edema  Respiratory: Normal effort BS clear with exception fine crackles left base.   Abdomen: soft +BS non-tender to palpation  Musculoskeletal: no clubbing no cyanosis   Data Reviewed: Basic Metabolic Panel:  Recent Labs Lab 03/29/13 1127 04/02/13 1505 04/04/13 1608 04/04/13 1618 04/05/13 0705  NA 134* 134*  --  125* 134*  K 3.4* 2.9*  --  2.9* 2.7*  CL 78* 77*  --  75* 86*  CO2 42* 46*  --  39* 42*  GLUCOSE 199* 186*  --  264* 89  BUN 67* 77*  --  105* 86*  CREATININE 1.71* 1.43*  --  1.89* 1.12*  CALCIUM 10.6* 10.6*  --  10.2 9.7  MG  --   --  2.4  --   --    Liver Function Tests:  Recent Labs Lab 04/04/13 1618  AST 28  ALT 18  ALKPHOS 175*  BILITOT 0.6  PROT 6.8  ALBUMIN 3.9   No results found for this basename: LIPASE, AMYLASE,  in the last 168 hours No results found for this basename: AMMONIA,  in the last 168 hours CBC:  Recent Labs Lab 04/04/13 1618 04/05/13 0705  WBC 10.2 8.2  NEUTROABS 7.8*  --  HGB 13.1 12.7  HCT 38.7 37.2  MCV 85.4 84.7  PLT 138* 130*   Cardiac Enzymes:  Recent Labs Lab 04/04/13 1618  TROPONINI <0.30   BNP (last 3 results)  Recent Labs  03/11/13 0625 03/15/13 0500 04/04/13 1618  PROBNP 4207.0* 3712.0* 2372.0*   CBG:  Recent Labs Lab 04/04/13 1528 04/04/13 2238 04/05/13 0804  GLUCAP 293* 163* 101*    No results found for this or any previous visit (from the past 240 hour(s)).   Studies: Dg Chest Port 1 View  04/04/2013   *RADIOLOGY REPORT*  Clinical Data: Chest pain.  PORTABLE CHEST - 1 VIEW  Comparison: 03/15/2013.  Findings: Cardiomegaly.  Central pulmonary vascular  prominence.  Interval improved aeration lung bases.  No segmental consolidation or gross pneumothorax.  The patient would eventually benefit from follow-up two-view chest with cardiac leads removed.  IMPRESSION: Cardiomegaly.  Central pulmonary vascular prominence.  Interval improved aeration lung bases.   Original Report Authenticated By: Lacy Duverney, M.D.    Scheduled Meds: . ALPRAZolam  0.5 mg Oral BID  . antiseptic oral rinse  15 mL Mouth Rinse BID  . aspirin EC  81 mg Oral q morning - 10a  . brimonidine  1 drop Both Eyes Daily  . docusate sodium  100 mg Oral BID  . enoxaparin (LOVENOX) injection  30 mg Subcutaneous Q24H  . ezetimibe  10 mg Oral QHS  . glipiZIDE  10 mg Oral BID AC  . insulin aspart  0-5 Units Subcutaneous QHS  . insulin aspart  0-9 Units Subcutaneous TID WC  . levothyroxine  150 mcg Oral QAC breakfast  . loratadine  10 mg Oral Daily  . oxyCODONE-acetaminophen  1 tablet Oral BID  . pantoprazole  40 mg Oral Daily  . polyethylene glycol  17 g Oral Daily  . potassium chloride  40 mEq Oral QID  . simvastatin  10 mg Oral q1800  . Travoprost (BAK Free)  1 drop Both Eyes QHS   Continuous Infusions: . sodium chloride 75 mL/hr at 04/05/13 2130    Active Problems:   Chronic respiratory failure with hypoxia   DM2 (diabetes mellitus, type 2)   Unspecified hypothyroidism   Obesity, morbid (more than 100 lbs over ideal weight or BMI > 40)   Pulmonary hypertension, severe; Severe by Echo 02/1713   Hypotension   Diastolic CHF, chronic- EF 65-70% May 2014   Volume depletion   Weakness   Acute renal failure   Hypokalemia    Time spent: 30 minutes    [facility] M  [facility] Pager [phone]. If 7PM-7AM, please contact night-coverage at [email or web address], password [facility] 04/05/2013, 9:47 AM  LOS: 1 day

## 2013-04-05 NOTE — Progress Notes (Addendum)
Patient seen, independently examined and chart reviewed. I agree with exam, assessment and plan discussed with Toya Smothers, NP.  Very pleasant 78 year old woman who was just recently at [facility] for treatment of acute on chronic right-sided heart failure secondary to pulmonary hypertension with significant diuresis and weight loss. She may have misunderstood the instructions to alter diuretic regimen. Regardless she developed symptoms of orthostasis and presented to the emergency department where she was found to be orthostatic and to have renal failure.  Today she feels much better. She remains quite orthostatic. Chronic respiratory failure appears stable and vitals are otherwise unremarkable. She is sitting in her chair and appears calm and comfortable. Respiratory effort appears normal. Lungs are clear. Repeat chemistry panel shows improvement in creatinine, BUN and sodium. Potassium still low although magnesium was normal.  She appears to have acute renal failure and electrolyte arrangement secondary to overdiuresis. Show far she is responding to gentle rehydration and temporary withholding of diuretics. We will need to monitor volume closely but she still remains quite orthostatic. We can probably stop IV fluids later today or in the morning and restart diuretics tomorrow.  She is refusing Lovenox. Place SCDs.  Brendia Sacks, MD [facility] [phone]

## 2013-04-06 ENCOUNTER — Encounter: Payer: Self-pay | Admitting: Pulmonary Disease

## 2013-04-06 ENCOUNTER — Telehealth: Payer: Self-pay | Admitting: Internal Medicine

## 2013-04-06 DIAGNOSIS — I5032 Chronic diastolic (congestive) heart failure: Secondary | ICD-10-CM

## 2013-04-06 LAB — BASIC METABOLIC PANEL WITH GFR
BUN: 56 mg/dL — ABNORMAL HIGH (ref 6–23)
CO2: 36 meq/L — ABNORMAL HIGH (ref 19–32)
Calcium: 9.9 mg/dL (ref 8.4–10.5)
Chloride: 97 meq/L (ref 96–112)
Creatinine, Ser: 0.89 mg/dL (ref 0.50–1.10)
GFR calc Af Amer: 72 mL/min — ABNORMAL LOW
GFR calc non Af Amer: 62 mL/min — ABNORMAL LOW
Glucose, Bld: 84 mg/dL (ref 70–99)
Potassium: 4.1 meq/L (ref 3.5–5.1)
Sodium: 138 meq/L (ref 135–145)

## 2013-04-06 LAB — GLUCOSE, CAPILLARY: Glucose-Capillary: 85 mg/dL (ref 70–99)

## 2013-04-06 MED ORDER — TORSEMIDE 20 MG PO TABS: ORAL_TABLET | ORAL | Status: DC

## 2013-04-06 NOTE — Progress Notes (Signed)
Utilization Review Complete  

## 2013-04-06 NOTE — Progress Notes (Signed)
Ambulated in hall with oxygen at 4L/M via [place], tolerated well, O2 sat 94% during ambulation.

## 2013-04-06 NOTE — Telephone Encounter (Signed)
Daughter called to notify Dr Rennis Golden that she is in [facility] told her next she was admitted to the hospital let him know. She went in Sunday and the doctor said said he was going to contact your group.She have not heard anything about your group seeing her.Very concerned about her oxygen level.

## 2013-04-06 NOTE — Discharge Summary (Signed)
Physician Discharge Summary  KIMRA KANTOR ZOX:[id number] DOB: 25-May-1935 DOA: 04/04/2013  PCP: Cassell Smiles., MD  Admit date: 04/04/2013 Discharge date: 04/06/2013  Time spent: 40 minutes  Recommendations for Outpatient Follow-up:  1. Pt has appointment on 04/[facility]/13 [facility]. Recommend evaluation of BP control as coreg, cozaar and cardizem held during hospitalization and at discharge due to soft BP. Instructed to document daily weights and to take data to appointment for evaluation as diuretic dose adjusted as well. Torsemide has been decreased to 40mg  once a day.  This can be adjusted in the outpatient setting.  2. Recommend OP follow up with PCP 1 -2 week for evaluation of DM regimen. Instructed to keep a journal of glucose readings to take to PCP for evaluation.   Discharge Diagnoses:  Active Problems:   Chronic respiratory failure with hypoxia   DM2 (diabetes mellitus, type 2)   Unspecified hypothyroidism   Obesity, morbid (more than 100 lbs over ideal weight or BMI > 40)   Pulmonary hypertension, severe; Severe by Echo 02/1713   Hypotension   Diastolic CHF, chronic- EF 65-70% May 2014   Volume depletion   Weakness   Acute renal failure   Hypokalemia   Hyponatremia   Thrombocytopenia, unspecified   Discharge Condition: stable  Diet recommendation: heart healthy  Filed Weights   04/04/13 1959 04/05/13 0543 04/06/13 0356  Weight: 199 lb 3.2 oz (90.357 kg) 200 lb 4.8 oz (90.855 kg) 206 lb 8 oz (93.668 kg)    History of present illness:  [name] is a 78 y.o.elderly Caucasian lady with multiple medical problems,  discharged from [facility] 03/08/13 after treatment for CHF exacerbation obstructive sleep apnea/COPD and chronic respiratory failure. She was diuresed and lost 11 pounds; 215 pounds was established as her dry weight.  Since discharge she'd been monitored at home and blood had been taken regularly over the previous week  to 10 days her BUN and creatinine was noted to be rising steadily and her weight crease into 191 pounds. She was asymptomatic but records show a telephone advice to reduce her Demadex from 60 mg twice daily to 40 mg twice daily. The patient apparently did not understand this instruction and continued to take the original dose. While in church on 8/10 she suddenly became dizzy and felt ill her daughter saw she was not well and took her home as she refused to come to hospital. At home her blood sugar was noted to be unusually elevated and she was persuaded to come to the hospital.  In the emergency room she was found to be hypotensive and her BUN and creatinine markedly elevated above baseline.  her only blood thinner is a baby aspirin, but she did have an prolonged nosebleed 3 days ago, swallowed a lot of blood, and reported black stool.She has difficulty sleeping because having to get up 4-6 times per night to pass urine, after taking Demadex about 3 to 4 PM in the afternoon.   Hospital Course:  Acute renal failure: likely related to over diuresis. Pt admitted to floor. Nephrotoxins held and she was very gently hydrated with IV fluids. Creatinine 1.89 on admission. On day of discharge creatinine 0.89. Will resume demadex at 40mg  daily.  This can be readdressed when she follow up with her primary cardiologist on 8/15    Volume depletion: related to #1. Very gently hydrated. Quite orthostatic on admission. At discharge VS within the limits of normal.   Hypotension: related to #  2. Initially held diuretics and other antihypertensive meds. Gently hydrated with IV fluids.She steadily improved. SBP 77 on day of admission. She was given fluids. At discharge SBP range 99-114. Pt will be discharged with demadex 40mg  daily. Will hold other antihypertensives at discharge and pt will see cardiology 04/09/13 for evaluation of BP and medication  Diastolic CHF, chronic- EF 65-70% May 2014 : See #2. At discharge demadex  40mg  daily ordered. Wt 206lbs at discharge, up from 199lbs on admission. Volume status -2L at discharge.    Chronic respiratory failure with hypoxia:Remained stable during this hospitalization.   DM2 (diabetes mellitus, type 2): poor control. A1C 7.0 Recommend OP follow up with PCP for evaluation of DM control.   Unspecified hypothyroidism:TSH 0.453. Continue home medication   Obesity, morbid (more than 100 lbs over ideal weight or BMI > 40) Nutritional consult.  Pulmonary hypertension, severe; Severe by Echo 02/1713   Hypokalemia: due to diuretics. Replaced and resolved at discharge   Hyponatremia: related to #2. Gently hydrated. Resolved at discharge  Thrombocytopenia: chart review indicates hx of same and current level higher than what appears to be baseline. No s/sx bleeding.       Procedures:  none  Consultations:  none  Discharge Exam: Filed Vitals:   04/06/13 0620  BP: 99/53  Pulse: 72  Temp:   Resp: 16    General: sitting up eating. Obese NAD Cardiovascular: RRR No MGR no LE edema Respiratory: normal effort BS clear bilaterally no wheeze no crackles  Discharge Instructions       Future Appointments Provider Department Dept Phone   04/09/2013 10:30 AM Chrystie Nose, MD [facility] [place] [phone]       Medication List    STOP taking these medications       carvedilol 3.125 MG tablet  Commonly known as:  COREG     diltiazem 120 MG 24 hr capsule  Commonly known as:  CARDIZEM CD     losartan 25 MG tablet  Commonly known as:  COZAAR      TAKE these medications       ALPRAZolam 0.5 MG tablet  Commonly known as:  XANAX  Take 0.5 mg by mouth 2 (two) times daily. May take up to 4 times daily as prescribed     aspirin 81 MG EC tablet  Take 81 mg by mouth every morning.     bisacodyl 10 MG suppository  Commonly known as:  DULCOLAX  Place 10 mg rectally as needed for constipation.     brimonidine 0.1 % Soln   Commonly known as:  ALPHAGAN P  Place 1 drop into both eyes daily.     ezetimibe 10 MG tablet  Commonly known as:  ZETIA  Take 10 mg by mouth at bedtime.     fexofenadine 180 MG tablet  Commonly known as:  ALLEGRA  Take 180 mg by mouth every morning.     glipiZIDE 10 MG tablet  Commonly known as:  GLUCOTROL  Take 10 mg by mouth 2 (two) times daily before a meal.     lansoprazole 30 MG capsule  Commonly known as:  PREVACID  Take 30 mg by mouth every morning.     levothyroxine 150 MCG tablet  Commonly known as:  SYNTHROID, LEVOTHROID  Take 150 mcg by mouth daily before breakfast.     linagliptin 5 MG Tabs tablet  Commonly known as:  TRADJENTA  Take 5 mg by mouth every morning.  oxyCODONE-acetaminophen 5-325 MG per tablet  Commonly known as:  PERCOCET/ROXICET  Take 1 tablet by mouth 2 (two) times daily. May take 1 additional dose if needed for pain     polyethylene glycol packet  Commonly known as:  MIRALAX / GLYCOLAX  Take 17 g by mouth daily.     potassium chloride SA 20 MEQ tablet  Commonly known as:  K-DUR,KLOR-CON  Take 1 tablet (20 mEq total) by mouth 2 (two) times daily.     pravastatin 20 MG tablet  Commonly known as:  PRAVACHOL  Take 20 mg by mouth at bedtime.     STOOL SOFTENER 100 MG capsule  Generic drug:  docusate sodium  Take 100 mg by mouth 2 (two) times daily.     torsemide 20 MG tablet  Commonly known as:  DEMADEX  Take 2 tabs once daily.     Travoprost (BAK Free) 0.004 % Soln ophthalmic solution  Commonly known as:  TRAVATAN  Place 1 drop into both eyes at bedtime.       Allergies  Allergen Reactions  . Darvocet (Propoxyphene-Acetaminophen) Nausea And Vomiting  . Diphenhydramine Hcl Other (See Comments)    Glaucoma prevents patient from taking this medication   . Aloe Vera Rash  . Penicillins Swelling and Rash   Follow-up Information   Follow [facility] On 04/09/2013. (Pt already has appointment with  [facility] 8/15. recommend medication evaluation )    Contact information:   [phone]       The results of significant diagnostics from this hospitalization (including imaging, microbiology, ancillary and laboratory) are listed below for reference.    Significant Diagnostic Studies: Dg Chest 2 View  03/08/2013   *RADIOLOGY REPORT*  Clinical Data: Shortness of breath  CHEST - 2 VIEW  Comparison: 01/11/2013  Findings: Bilateral pleural effusions, right greater than left. New right infrahilar consolidation / atelectasis.  Low lung volumes.  Mild cardiomegaly.  Mild central pulmonary vascular prominence.  Atheromatous aorta.  IMPRESSION:  1.  Cardiomegaly with bilateral pleural effusions and   right lower lobe atelectasis/consolidation.   Original Report Authenticated By: D. Andria Rhein, MD   Dg Chest Port 1 View  04/04/2013   *RADIOLOGY REPORT*  Clinical Data: Chest pain.  PORTABLE CHEST - 1 VIEW  Comparison: 03/15/2013.  Findings: Cardiomegaly.  Central pulmonary vascular prominence.  Interval improved aeration lung bases.  No segmental consolidation or gross pneumothorax.  The patient would eventually benefit from follow-up two-view chest with cardiac leads removed.  IMPRESSION: Cardiomegaly.  Central pulmonary vascular prominence.  Interval improved aeration lung bases.   Original Report Authenticated By: Lacy Duverney, M.D.   Dg Chest Port 1 View  03/15/2013   *RADIOLOGY REPORT*  Clinical Data: Pulmonary infiltrates.  PORTABLE CHEST - 1 VIEW  Comparison: 03/13/2013.  Findings: The cardiac silhouette, mediastinal and hilar contours are stable.  There are persistent bilateral infiltrates with slight improved aeration on the left.  Suspect small effusions.  No pneumothorax.  IMPRESSION: Persistent bilateral infiltrates with slight improved left basilar aeration. Small bilateral pleural effusions.   Original Report Authenticated By: Rudie Meyer, M.D.   Dg Chest Port 1 View  03/13/2013    *RADIOLOGY REPORT*  Clinical Data: Follow up atelectasis.  PORTABLE CHEST - 1 VIEW  Comparison: Chest radiograph performed 03/08/2013  Findings: The lungs are mildly hypoexpanded.  Mildly better defined bibasilar airspace opacities are seen.  This may reflect atelectasis or pneumonia.  Small bilateral pleural effusions are noted.  No  pneumothorax identified.  The cardiomediastinal silhouette is enlarged.  Calcification is noted within the aortic arch.  No acute osseous abnormalities are seen.  IMPRESSION:  1.  Lungs mildly hypoexpanded.  Somewhat more prominent bibasilar airspace opacities may reflect atelectasis or pneumonia. 2.  Small bilateral pleural effusions seen.  Cardiomegaly noted.   Original Report Authenticated By: Tonia Ghent, M.D.    Microbiology: No results found for this or any previous visit (from the past 240 hour(s)).   Labs: Basic Metabolic Panel:  Recent Labs Lab 04/02/13 1505 04/04/13 1608 04/04/13 1618 04/05/13 0705 04/06/13 0600  NA 134*  --  125* 134* 138  K 2.9*  --  2.9* 2.7* 4.1  CL 77*  --  75* 86* 97  CO2 46*  --  39* 42* 36*  GLUCOSE 186*  --  264* 89 84  BUN 77*  --  105* 86* 56*  CREATININE 1.43*  --  1.89* 1.12* 0.89  CALCIUM 10.6*  --  10.2 9.7 9.9  MG  --  2.4  --   --   --    Liver Function Tests:  Recent Labs Lab 04/04/13 1618  AST 28  ALT 18  ALKPHOS 175*  BILITOT 0.6  PROT 6.8  ALBUMIN 3.9   No results found for this basename: LIPASE, AMYLASE,  in the last 168 hours No results found for this basename: AMMONIA,  in the last 168 hours CBC:  Recent Labs Lab 04/04/13 1618 04/05/13 0705  WBC 10.2 8.2  NEUTROABS 7.8*  --   HGB 13.1 12.7  HCT 38.7 37.2  MCV 85.4 84.7  PLT 138* 130*   Cardiac Enzymes:  Recent Labs Lab 04/04/13 1618  TROPONINI <0.30   BNP: BNP (last 3 results)  Recent Labs  03/11/13 0625 03/15/13 0500 04/04/13 1618  PROBNP 4207.0* 3712.0* 2372.0*   CBG:  Recent Labs Lab 04/05/13 0804  04/05/13 1139 04/05/13 1644 04/05/13 2130 04/06/13 0739  GLUCAP 101* 130* 161* 220* 85       Signed:  BLACK,KAREN M  [facility] 04/06/2013, 9:40 AM  Attending note:  Patient seen and examined.  Above note reviewed and amended.  Agree with note as above per Toya Smothers, NP.  Erick Blinks

## 2013-04-06 NOTE — Telephone Encounter (Signed)
Message forwarded to Dr. Hilty/Jenna, RN. 

## 2013-04-06 NOTE — Care Management Note (Signed)
    Page 1 of 1   04/06/2013     1:51:29 PM   CARE MANAGEMENT NOTE 04/06/2013  Patient:  [name], [name]   Account Number:  [id number]  Date Initiated:  04/06/2013  Documentation initiated by:  Rosemary Holms  Subjective/Objective Assessment:   pt admitted from home where she lives with her son next door. Followed by [facility] Marja Kays and has a CNA. Per [facility], family is very involved with care and food for pt.     Action/Plan:   Anticipated DC Date:  04/06/2013   Anticipated DC Plan:        DC Planning Services  CM consult      Choice offered to / List presented to:             Status of service:  Completed, signed off Medicare Important Message given?   (If response is "NO", the following Medicare IM given date fields will be blank) Date Medicare IM given:   Date Additional Medicare IM given:    Discharge Disposition:  HOME/SELF CARE  Per UR Regulation:    If discussed at Long Length of Stay Meetings, dates discussed:    Comments:  04/06/13 Rosemary Holms RN BSN CM

## 2013-04-07 ENCOUNTER — Telehealth: Payer: Self-pay | Admitting: Pulmonary Disease

## 2013-04-07 ENCOUNTER — Telehealth: Payer: Self-pay | Admitting: Internal Medicine

## 2013-04-07 NOTE — Telephone Encounter (Signed)
Author: Wilford Corner, RN Service: (none) Author Type: Registered Nurse   Filed: 04/06/2013 2:48 PM Note Time: 04/06/2013 2:47 PM         Ambulated in hall with oxygen at 4L/M via [place], tolerated well, O2 sat 94% during ambulation  ---  I spoke with daughter-in-law. Made her aware hospital d/c does note she tolerated 4 liters w/ ambulating. She stated they are going to [place] this weekend and pt will not be doing nay activity just resting. She does have a pulse OX to check her O2 saturations and will call us if her O2 level drops. I will also forward to VS as an FYI pt just go out of the hospital.

## 2013-04-07 NOTE — Telephone Encounter (Signed)
Message forwarded to J. Jeannetta Nap, RN/Medical Records to process.

## 2013-04-07 NOTE — Telephone Encounter (Signed)
Have faxed this info 4 times-Need last ejection fraction and confirmation that she have CHF.Would uou please get back to her asap.She will fax this again in a few minutes.

## 2013-04-08 NOTE — Telephone Encounter (Signed)
Called Sharlene Motts to verify HF status/HF program set up/maintainance.

## 2013-04-09 ENCOUNTER — Ambulatory Visit (INDEPENDENT_AMBULATORY_CARE_PROVIDER_SITE_OTHER): Payer: Medicare Other | Admitting: Internal Medicine

## 2013-04-09 ENCOUNTER — Encounter: Payer: Self-pay | Admitting: Internal Medicine

## 2013-04-09 VITALS — BP 134/86 | HR 85 | Ht 61.0 in | Wt 201.0 lb

## 2013-04-09 DIAGNOSIS — I509 Heart failure, unspecified: Secondary | ICD-10-CM

## 2013-04-09 DIAGNOSIS — I50810 Right heart failure, unspecified: Secondary | ICD-10-CM

## 2013-04-09 DIAGNOSIS — I5032 Chronic diastolic (congestive) heart failure: Secondary | ICD-10-CM

## 2013-04-09 DIAGNOSIS — R0902 Hypoxemia: Secondary | ICD-10-CM

## 2013-04-09 DIAGNOSIS — I272 Pulmonary hypertension, unspecified: Secondary | ICD-10-CM

## 2013-04-09 DIAGNOSIS — J9611 Chronic respiratory failure with hypoxia: Secondary | ICD-10-CM

## 2013-04-09 DIAGNOSIS — J9610 Chronic respiratory failure, unspecified whether with hypoxia or hypercapnia: Secondary | ICD-10-CM

## 2013-04-09 DIAGNOSIS — I2789 Other specified pulmonary heart diseases: Secondary | ICD-10-CM

## 2013-04-09 DIAGNOSIS — N179 Acute kidney failure, unspecified: Secondary | ICD-10-CM

## 2013-04-09 NOTE — Patient Instructions (Addendum)
Your physician recommends that you schedule a follow-up appointment in: 1 month  

## 2013-04-10 ENCOUNTER — Encounter: Payer: Self-pay | Admitting: Internal Medicine

## 2013-04-10 NOTE — Progress Notes (Signed)
04/10/2013   PCP: Cassell Smiles., MD   Chief Complaint  Patient presents with  . Follow-up    post hosp for dehydration; DOE; down from 6L to 4L O2; stopped carvedilol 3.125mg , dilt 120mg , losartan 25mg     Primary Cardiologist: Dr. Rennis Golden  HPI:  78 y/o, morbidly obese female with a history of right heart failure, pulmonary hypertension and obstructive sleep apnea, intolerant to CPAP. She has had multiple admissions this year for acute on chronic diastolic HF. Her last echo was on 01/12/2013, which demonstrated normal LV function, with an EF of 65-70% and grade I diastolic dysfunction. She presented back to [facility] July 15 with a complaint of progressive resting dyspnea and bilateral lower extremity edema. She noted associated weight gain, orthopnea and PND. She denied any chest pain. She was diuresed, and improved. Dry weight was thought to be 215 pounds.   She was diuresed 11 liters in the hospital. At discharge he was to be on Bumex 3 mg daily and Zaroxolyn 5 mg every morning the Cozaar was held secondary to hypotension. Also she continued with home oxygen.  Unfortunately Bumex was not being made so she was placed on Demadex 60 mg twice a day.Since discharge she has done quite well, her weight dropped quite dramatically to 199 pounds.  She feels much better not we can all objects and feels like her energy has increased somewhat. At her home her weight has been 196.  She denies shortness of breath or chest pain and she does wear oxygen.  Repeat laboratory work was performed and it demonstrated probable dehydration or over diuresis. After being seen by Nada Boozer, one of our nurse practitioners, she recommended decreasing her diuretics which ever occurred. Unfortunately she re\re presented to the hospital in dehydration. Her medications were adjusted and she was hypotensive, therefore her blood pressure medications were stopped. Today she returns from that hospital followup and is on a lower  dose of diuretics. Again her blood pressure medicines are stopped, and I suspect she was on higher doses of blood pressure medicines due to the very high-volume and her pulmonary hypertension. She reports doing fairly well with a stable weight at home. Today in the office is 201 however she has a recorded at 196.   Allergies  Allergen Reactions  . Darvocet [Propoxyphene-Acetaminophen] Nausea And Vomiting  . Diphenhydramine Hcl Other (See Comments)    Glaucoma prevents patient from taking this medication   . Aloe Vera Rash  . Penicillins Swelling and Rash    Current Outpatient Prescriptions  Medication Sig Dispense Refill  . ALPRAZolam (XANAX) 0.5 MG tablet Take 0.5 mg by mouth 2 (two) times daily. May take up to 4 times daily as prescribed      . aspirin 81 MG EC tablet Take 81 mg by mouth every morning.       . bisacodyl (DULCOLAX) 10 MG suppository Place 10 mg rectally as needed for constipation.      . brimonidine (ALPHAGAN P) 0.1 % SOLN Place 1 drop into both eyes daily.      Marland Kitchen docusate sodium (STOOL SOFTENER) 100 MG capsule Take 100 mg by mouth 2 (two) times daily.      Marland Kitchen ezetimibe (ZETIA) 10 MG tablet Take 10 mg by mouth at bedtime.        . fexofenadine (ALLEGRA) 180 MG tablet Take 180 mg by mouth every morning.      Marland Kitchen glipiZIDE (GLUCOTROL) 10 MG tablet Take 10 mg by  mouth 2 (two) times daily before a meal.        . lansoprazole (PREVACID) 30 MG capsule Take 30 mg by mouth every morning.       Marland Kitchen levothyroxine (SYNTHROID, LEVOTHROID) 150 MCG tablet Take 150 mcg by mouth daily before breakfast.       . linagliptin (TRADJENTA) 5 MG TABS tablet Take 5 mg by mouth every morning.       Marland Kitchen oxyCODONE-acetaminophen (PERCOCET/ROXICET) 5-325 MG per tablet Take 1 tablet by mouth 2 (two) times daily. May take 1 additional dose if needed for pain      . OXYGEN-HELIUM IN Inhale 4 L into the lungs continuous.      . polyethylene glycol (MIRALAX / GLYCOLAX) packet Take 17 g by mouth daily.      .  potassium chloride SA (K-DUR,KLOR-CON) 20 MEQ tablet Take 1 tablet (20 mEq total) by mouth 2 (two) times daily.      . pravastatin (PRAVACHOL) 20 MG tablet Take 20 mg by mouth at bedtime.       . torsemide (DEMADEX) 20 MG tablet Take 2 tabs once daily.  30 tablet  0  . Travoprost, BAK Free, (TRAVATAN) 0.004 % SOLN ophthalmic solution Place 1 drop into both eyes at bedtime.       No current facility-administered medications for this visit.    Past Medical History  Diagnosis Date  . Diabetes mellitus   . Hypertension   . Shingles   . GERD (gastroesophageal reflux disease)   . Abnormal heart rhythms   . Thyroid disease     hypthyroidism  . Pulmonary hypertension, moderate to severe     No evidence of CAD on Cath 2011  . Obesity, morbid (more than 100 lbs over ideal weight or BMI > 40)   . Chronic respiratory failure with hypoxia 01/23/2012    Related to OHS  . Glaucoma   . CHF (congestive heart failure)     Echo at [facility] 10/14/2012 see notes  . Acute on chronic renal failure     Past Surgical History  Procedure Laterality Date  . Tubal ligation    . Ovary removed    . Knee arthroscopy      left  . Cardiovascular stress test  09/15/2006    EF 81%; normal perfusion all regions; LV normal in size; no scintigraphic evidence of inducible myocardial ischemia  . Cardiac catheterization  02/21/2010    EF 65%; PA pressure 86/21 with mean of 44; normal coronary arteries, primary pumonary hypertension; no significant wall motion abnormalities    NFA:OZHYQMV:HQ colds or fevers, significant weight changes Skin:no rashes or ulcers HEENT:no blurred vision, no congestion CV:see HPI PUL:see HPI GI:no diarrhea constipation or melena, no indigestion GU:no hematuria, no dysuria MS:no joint pain, no claudication Neuro:no syncope, no lightheadedness Endo:+ diabetes, + thyroid disease  PHYSICAL EXAM BP 134/86  Pulse 85  Ht 5\' 1"  (1.549 m)  Wt 201 lb (91.173 kg)  BMI 38 kg/m2 General:Pleasant  affect, NAD Skin:Warm and dry, brisk capillary refill HEENT:normocephalic, sclera clear, mucus membranes moist, nasal cannula with oxygen in place. Neck:supple, no JVD, no bruits  Heart:S1S2 RRR without murmur, gallup, rub or click Lungs:clear without rales, rhonchi, or wheezes ION:GEXB, non tender, + BS, do not palpate liver spleen or masses Ext:no lower ext edema, 2+ pedal pulses, 2+ radial pulses Neuro:alert and oriented, MAE, follows commands, + facial symmetry  EKG:  Normal sinus rhythm at 85  ASSESSMENT AND PLAN Patient Active Problem List  Diagnosis Date Noted  . Hypokalemia 04/05/2013  . Hyponatremia 04/05/2013  . Thrombocytopenia, unspecified 04/05/2013  . Volume depletion 04/04/2013  . Weakness 04/04/2013  . Acute renal failure 04/04/2013  . Hypotension 12/25/2012  . Diastolic CHF, chronic- EF 65-70% May 2014 12/25/2012  . Unspecified hypothyroidism 10/14/2012  . Normal coronary arteries at cath 2011 10/14/2012  . Obesity, morbid (more than 100 lbs over ideal weight or BMI > 40)   . Pulmonary hypertension, severe; Severe by Echo 02/1713   . 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 DM2 (diabetes mellitus, type 2) 10/13/2012  . Chronic respiratory failure with hypoxia 01/23/2012   Plan: 1.  [name] is no longer requiring the blood pressure medications which is probably due to her excessive diuresis. At this point she was allowed to equilibrate some fluid and is on a lower dose of diuretic. He have to watch her very closely to see if she gains any fluid or not. I'll plan to keep her on any medications that she is on at this time. We'll see her back in a month for close followup.  Chrystie Nose, MD, [facility] Attending Cardiologist [facility]

## 2013-04-28 ENCOUNTER — Telehealth: Payer: Self-pay | Admitting: Cardiology

## 2013-04-28 NOTE — Telephone Encounter (Signed)
Message forwarded to L. Ingold, NP for further instructions.  

## 2013-04-28 NOTE — Telephone Encounter (Signed)
Needs a diagnosis code for lab work order on 03-26-13 please.

## 2013-05-10 ENCOUNTER — Encounter: Payer: Self-pay | Admitting: Internal Medicine

## 2013-05-10 ENCOUNTER — Ambulatory Visit (INDEPENDENT_AMBULATORY_CARE_PROVIDER_SITE_OTHER): Payer: Medicare Other | Admitting: Internal Medicine

## 2013-05-10 VITALS — BP 110/72 | HR 96 | Ht 61.0 in | Wt 182.0 lb

## 2013-05-10 DIAGNOSIS — J9611 Chronic respiratory failure with hypoxia: Secondary | ICD-10-CM

## 2013-05-10 DIAGNOSIS — J9610 Chronic respiratory failure, unspecified whether with hypoxia or hypercapnia: Secondary | ICD-10-CM

## 2013-05-10 DIAGNOSIS — R0902 Hypoxemia: Secondary | ICD-10-CM

## 2013-05-10 DIAGNOSIS — I2789 Other specified pulmonary heart diseases: Secondary | ICD-10-CM

## 2013-05-10 DIAGNOSIS — I509 Heart failure, unspecified: Secondary | ICD-10-CM

## 2013-05-10 DIAGNOSIS — I5032 Chronic diastolic (congestive) heart failure: Secondary | ICD-10-CM

## 2013-05-10 DIAGNOSIS — I272 Pulmonary hypertension, unspecified: Secondary | ICD-10-CM

## 2013-05-10 NOTE — Progress Notes (Signed)
05/10/2013   PCP: Cassell Smiles., MD   Chief Complaint  Patient presents with  . Follow-up    1 month; denies CP; DOE; feet burn    Primary Cardiologist: Dr. Rennis Golden  HPI:  78 y/o, morbidly obese female with a history of right heart failure, pulmonary hypertension and obstructive sleep apnea, intolerant to CPAP. She has had multiple admissions this year for acute on chronic diastolic HF. Her last echo was on 01/12/2013, which demonstrated normal LV function, with an EF of 65-70% and grade I diastolic dysfunction. She presented back to [facility] July 15 with a complaint of progressive resting dyspnea and bilateral lower extremity edema. She noted associated weight gain, orthopnea and PND. She denied any chest pain. She was diuresed, and improved. Dry weight was thought to be 215 pounds.   She was diuresed 11 liters in the hospital. At discharge he was to be on Bumex 3 mg daily and Zaroxolyn 5 mg every morning the Cozaar was held secondary to hypotension. Also she continued with home oxygen.  Unfortunately Bumex was not being made so she was placed on Demadex 60 mg twice a day.Since discharge she has done quite well, her weight dropped quite dramatically to 199 pounds.  She feels much better not we can all objects and feels like her energy has increased somewhat. At her home her weight has been 196.  She denies shortness of breath or chest pain and she does wear oxygen.  Repeat laboratory work was performed and it demonstrated probable dehydration or over diuresis. After being seen by Nada Boozer, one of our nurse practitioners, she recommended decreasing her diuretics which ever occurred. Unfortunately she re\re presented to the hospital in dehydration. Her medications were adjusted and she was hypotensive, therefore her blood pressure medications were stopped. Today she returns from that hospital followup and is on a lower dose of diuretics. Again her blood pressure medicines are stopped, and I  suspect she was on higher doses of blood pressure medicines due to the very high-volume and her pulmonary hypertension. She reports doing fairly well with a stable weight at home.   She in a diary of her weights from home and it shows persistent decline in her weight of about 6 pounds over the past 2 weeks. She reports that she is eating well and urinating appropriately. Her lower extremity edema is almost nonexistent and overall she is doing fairly well.  Allergies  Allergen Reactions  . Darvocet [Propoxyphene-Acetaminophen] Nausea And Vomiting  . Diphenhydramine Hcl Other (See Comments)    Glaucoma prevents patient from taking this medication   . Aloe Vera Rash  . Penicillins Swelling and Rash    Current Outpatient Prescriptions  Medication Sig Dispense Refill  . ALPRAZolam (XANAX) 0.5 MG tablet Take 0.5 mg by mouth 2 (two) times daily. May take up to 4 times daily as prescribed      . aspirin 81 MG EC tablet Take 81 mg by mouth every morning.       . bisacodyl (DULCOLAX) 10 MG suppository Place 10 mg rectally as needed for constipation.      . brimonidine (ALPHAGAN P) 0.1 % SOLN Place 1 drop into both eyes daily.      Marland Kitchen docusate sodium (STOOL SOFTENER) 100 MG capsule Take 100 mg by mouth 2 (two) times daily.      Marland Kitchen ezetimibe (ZETIA) 10 MG tablet Take 10 mg by mouth at bedtime.        Marland Kitchen  fexofenadine (ALLEGRA) 180 MG tablet Take 180 mg by mouth every morning.      Marland Kitchen glipiZIDE (GLUCOTROL) 10 MG tablet Take 10 mg by mouth 2 (two) times daily before a meal.        . lansoprazole (PREVACID) 30 MG capsule Take 30 mg by mouth every morning.       Marland Kitchen levothyroxine (SYNTHROID, LEVOTHROID) 150 MCG tablet Take 150 mcg by mouth daily before breakfast.       . linagliptin (TRADJENTA) 5 MG TABS tablet Take 5 mg by mouth every morning.       Marland Kitchen oxyCODONE-acetaminophen (PERCOCET/ROXICET) 5-325 MG per tablet Take 1 tablet by mouth 2 (two) times daily. May take 1 additional dose if needed for pain      .  OXYGEN-HELIUM IN Inhale 4 L into the lungs continuous.      . polyethylene glycol (MIRALAX / GLYCOLAX) packet Take 17 g by mouth daily.      . potassium chloride SA (K-DUR,KLOR-CON) 20 MEQ tablet Take 1 tablet (20 mEq total) by mouth 2 (two) times daily.      . pravastatin (PRAVACHOL) 20 MG tablet Take 20 mg by mouth at bedtime.       . torsemide (DEMADEX) 20 MG tablet Take 2 tabs once daily.  30 tablet  0  . Travoprost, BAK Free, (TRAVATAN) 0.004 % SOLN ophthalmic solution Place 1 drop into both eyes at bedtime.       No current facility-administered medications for this visit.    Past Medical History  Diagnosis Date  . Diabetes mellitus   . Hypertension   . Shingles   . GERD (gastroesophageal reflux disease)   . Abnormal heart rhythms   . Thyroid disease     hypthyroidism  . Pulmonary hypertension, moderate to severe     No evidence of CAD on Cath 2011  . Obesity, morbid (more than 100 lbs over ideal weight or BMI > 40)   . Chronic respiratory failure with hypoxia 01/23/2012    Related to OHS  . Glaucoma   . CHF (congestive heart failure)     Echo at [facility] 10/14/2012 see notes  . Acute on chronic renal failure     Past Surgical History  Procedure Laterality Date  . Tubal ligation    . Ovary removed    . Knee arthroscopy      left  . Cardiovascular stress test  09/15/2006    EF 81%; normal perfusion all regions; LV normal in size; no scintigraphic evidence of inducible myocardial ischemia  . Cardiac catheterization  02/21/2010    EF 65%; PA pressure 86/21 with mean of 44; normal coronary arteries, primary pumonary hypertension; no significant wall motion abnormalities    ZOX:[id number]:UJ colds or fevers, significant weight changes Skin:no rashes or ulcers HEENT:no blurred vision, no congestion CV:see HPI PUL:see HPI GI:no diarrhea constipation or melena, no indigestion GU:no hematuria, no dysuria MS:no joint pain, no claudication Neuro:no syncope, no lightheadedness Endo:+  diabetes, + thyroid disease  PHYSICAL EXAM BP 110/72  Pulse 96  Ht 5\' 1"  (1.549 m)  Wt 182 lb (82.555 kg)  BMI 34.41 kg/m2 No significant LE edema Lungs with decreased breath sounds, no crackles  EKG:  deferred  ASSESSMENT AND PLAN Patient Active Problem List   Diagnosis Date Noted  . Hypokalemia 04/05/2013  . Hyponatremia 04/05/2013  . Thrombocytopenia, unspecified 04/05/2013  . Volume depletion 04/04/2013  . Weakness 04/04/2013  . Acute renal failure 04/04/2013  . Hypotension 12/25/2012  .  Diastolic CHF, chronic- EF 65-70% May 2014 12/25/2012  . Unspecified hypothyroidism 10/14/2012  . Normal coronary arteries at cath 2011 10/14/2012  . Obesity, morbid (more than 100 lbs over ideal weight or BMI > 40)   . Pulmonary hypertension, severe; Severe by Echo 02/1713   . 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 DM2 (diabetes mellitus, type 2) 10/13/2012  . Chronic respiratory failure with hypoxia 01/23/2012   Plan: 1.  [name] appears stable on her current dose of diuretics although her weight is trending down. I suppose this could be over diuresis, however it may be due to change in diet. She does report her appetite is good however. There is no indication of volume overload today. I would recommend continuing her current dose of diuretics. She is also requesting a humidifier for her oxygen tank today and I was happy to provide that.  Return in 6 months.  Chrystie Nose, MD, [facility] Attending Cardiologist [facility]

## 2013-05-10 NOTE — Patient Instructions (Addendum)
Your physician wants you to follow-up in:  6 months. You will receive a reminder letter in the mail two months in advance. If you don't receive a letter, please call our office to schedule the follow-up appointment.   

## 2013-06-24 ENCOUNTER — Ambulatory Visit (INDEPENDENT_AMBULATORY_CARE_PROVIDER_SITE_OTHER): Payer: Medicare Other | Admitting: Cardiology

## 2013-06-24 ENCOUNTER — Other Ambulatory Visit: Payer: Self-pay | Admitting: *Deleted

## 2013-06-24 ENCOUNTER — Inpatient Hospital Stay (HOSPITAL_COMMUNITY)
Admission: AD | Admit: 2013-06-24 | Discharge: 2013-06-26 | DRG: 378 | Disposition: A | Discharge status: Home / Self Care | Payer: Medicare Other | Source: Ambulatory Visit | Attending: Internal Medicine | Admitting: Internal Medicine

## 2013-06-24 ENCOUNTER — Encounter: Payer: Self-pay | Admitting: Cardiology

## 2013-06-24 VITALS — BP 130/72 | HR 101 | Ht 61.0 in | Wt 207.0 lb

## 2013-06-24 DIAGNOSIS — R531 Weakness: Secondary | ICD-10-CM

## 2013-06-24 DIAGNOSIS — R5383 Other fatigue: Secondary | ICD-10-CM

## 2013-06-24 DIAGNOSIS — J9611 Chronic respiratory failure with hypoxia: Secondary | ICD-10-CM | POA: Diagnosis present

## 2013-06-24 DIAGNOSIS — IMO0001 Reserved for inherently not codable concepts without codable children: Secondary | ICD-10-CM

## 2013-06-24 DIAGNOSIS — Z7982 Long term (current) use of aspirin: Secondary | ICD-10-CM

## 2013-06-24 DIAGNOSIS — R5381 Other malaise: Secondary | ICD-10-CM

## 2013-06-24 DIAGNOSIS — I509 Heart failure, unspecified: Secondary | ICD-10-CM

## 2013-06-24 DIAGNOSIS — G4733 Obstructive sleep apnea (adult) (pediatric): Secondary | ICD-10-CM | POA: Diagnosis present

## 2013-06-24 DIAGNOSIS — I2789 Other specified pulmonary heart diseases: Secondary | ICD-10-CM

## 2013-06-24 DIAGNOSIS — I272 Pulmonary hypertension, unspecified: Secondary | ICD-10-CM

## 2013-06-24 DIAGNOSIS — I5032 Chronic diastolic (congestive) heart failure: Secondary | ICD-10-CM

## 2013-06-24 DIAGNOSIS — Z0389 Encounter for observation for other suspected diseases and conditions ruled out: Secondary | ICD-10-CM

## 2013-06-24 DIAGNOSIS — K922 Gastrointestinal hemorrhage, unspecified: Secondary | ICD-10-CM

## 2013-06-24 DIAGNOSIS — R0902 Hypoxemia: Secondary | ICD-10-CM

## 2013-06-24 DIAGNOSIS — E119 Type 2 diabetes mellitus without complications: Secondary | ICD-10-CM

## 2013-06-24 DIAGNOSIS — K921 Melena: Principal | ICD-10-CM | POA: Diagnosis present

## 2013-06-24 DIAGNOSIS — Z6841 Body Mass Index (BMI) 40.0 and over, adult: Secondary | ICD-10-CM

## 2013-06-24 DIAGNOSIS — D62 Acute posthemorrhagic anemia: Secondary | ICD-10-CM | POA: Diagnosis present

## 2013-06-24 DIAGNOSIS — J9610 Chronic respiratory failure, unspecified whether with hypoxia or hypercapnia: Secondary | ICD-10-CM | POA: Diagnosis present

## 2013-06-24 DIAGNOSIS — I959 Hypotension, unspecified: Secondary | ICD-10-CM | POA: Diagnosis present

## 2013-06-24 DIAGNOSIS — K279 Peptic ulcer, site unspecified, unspecified as acute or chronic, without hemorrhage or perforation: Secondary | ICD-10-CM | POA: Diagnosis present

## 2013-06-24 DIAGNOSIS — Z79899 Other long term (current) drug therapy: Secondary | ICD-10-CM

## 2013-06-24 LAB — COMPREHENSIVE METABOLIC PANEL
ALT: 12 U/L (ref 0–35)
AST: 23 U/L (ref 0–37)
Albumin: 3.7 g/dL (ref 3.5–5.2)
Alkaline Phosphatase: 166 U/L — ABNORMAL HIGH (ref 39–117)
BUN: 23 mg/dL (ref 6–23)
CO2: 23 mEq/L (ref 19–32)
Calcium: 9.1 mg/dL (ref 8.4–10.5)
Chloride: 104 mEq/L (ref 96–112)
Creatinine, Ser: 0.93 mg/dL (ref 0.50–1.10)
GFR calc Af Amer: 67 mL/min — ABNORMAL LOW (ref >90)
GFR calc non Af Amer: 58 mL/min — ABNORMAL LOW (ref >90)
Glucose, Bld: 147 mg/dL — ABNORMAL HIGH (ref 70–99)
Potassium: 4.1 mEq/L (ref 3.5–5.1)
Sodium: 140 mEq/L (ref 135–145)
Total Bilirubin: 0.4 mg/dL (ref 0.3–1.2)
Total Protein: 6.4 g/dL (ref 6.0–8.3)

## 2013-06-24 LAB — PRO B NATRIURETIC PEPTIDE: Pro B Natriuretic peptide (BNP): 2425 pg/mL — ABNORMAL HIGH (ref 0–450)

## 2013-06-24 LAB — CBC
HCT: 25.1 % — ABNORMAL LOW (ref 36.0–46.0)
Hemoglobin: 7.7 g/dL — ABNORMAL LOW (ref 12.0–15.0)
MCH: 25 pg — ABNORMAL LOW (ref 26.0–34.0)
MCHC: 30.7 g/dL (ref 30.0–36.0)
MCV: 81.5 fL (ref 78.0–100.0)
Platelets: 162 10*3/uL (ref 150–400)
RBC: 3.08 MIL/uL — ABNORMAL LOW (ref 3.87–5.11)
RDW: 15.3 % (ref 11.5–15.5)
WBC: 6.5 10*3/uL (ref 4.0–10.5)

## 2013-06-24 LAB — GLUCOSE, CAPILLARY: Glucose-Capillary: 105 mg/dL — ABNORMAL HIGH (ref 70–99)

## 2013-06-24 LAB — PROTIME-INR
INR: 1.21 (ref 0.00–1.49)
Prothrombin Time: 15 seconds (ref 11.6–15.2)

## 2013-06-24 LAB — TSH: TSH: 0.057 u[IU]/mL — ABNORMAL LOW (ref 0.350–4.500)

## 2013-06-24 LAB — PREPARE RBC (CROSSMATCH)

## 2013-06-24 MED ORDER — SIMVASTATIN 5 MG PO TABS
5.0000 mg | ORAL_TABLET | Freq: Every day | ORAL | Status: DC
  Administered 2013-06-24 – 2013-06-25 (×2): 5 mg via ORAL
  Filled 2013-06-24 (×3): qty 1

## 2013-06-24 MED ORDER — GLIPIZIDE 10 MG PO TABS
10.0000 mg | ORAL_TABLET | Freq: Two times a day (BID) | ORAL | Status: DC
  Administered 2013-06-25 – 2013-06-26 (×2): 10 mg via ORAL
  Filled 2013-06-24 (×5): qty 1

## 2013-06-24 MED ORDER — PANTOPRAZOLE SODIUM 40 MG PO TBEC: 40.0000 mg | DELAYED_RELEASE_TABLET | Freq: Every day | ORAL | Status: DC

## 2013-06-24 MED ORDER — FUROSEMIDE 10 MG/ML IJ SOLN
20.0000 mg | Freq: Once | INTRAMUSCULAR | Status: AC
  Administered 2013-06-25: 20 mg via INTRAVENOUS
  Filled 2013-06-24: qty 2

## 2013-06-24 MED ORDER — POTASSIUM CHLORIDE CRYS ER 10 MEQ PO TBCR
EXTENDED_RELEASE_TABLET | ORAL | Status: AC
  Filled 2013-06-24: qty 2

## 2013-06-24 MED ORDER — INSULIN ASPART 100 UNIT/ML ~~LOC~~ SOLN
0.0000 [IU] | Freq: Three times a day (TID) | SUBCUTANEOUS | Status: DC
  Administered 2013-06-25: 2 [IU] via SUBCUTANEOUS

## 2013-06-24 MED ORDER — ASPIRIN 81 MG PO CHEW
81.0000 mg | CHEWABLE_TABLET | Freq: Every morning | ORAL | Status: DC
  Filled 2013-06-24: qty 1

## 2013-06-24 MED ORDER — DOCUSATE SODIUM 100 MG PO CAPS
100.0000 mg | ORAL_CAPSULE | Freq: Two times a day (BID) | ORAL | Status: DC
  Administered 2013-06-24 – 2013-06-25 (×3): 100 mg via ORAL
  Filled 2013-06-24 (×5): qty 1

## 2013-06-24 MED ORDER — LEVOTHYROXINE SODIUM 150 MCG PO TABS
150.0000 ug | ORAL_TABLET | Freq: Every day | ORAL | Status: DC
  Administered 2013-06-25 – 2013-06-26 (×2): 150 ug via ORAL
  Filled 2013-06-24 (×3): qty 1

## 2013-06-24 MED ORDER — EZETIMIBE 10 MG PO TABS
10.0000 mg | ORAL_TABLET | Freq: Every day | ORAL | Status: DC
  Administered 2013-06-24 – 2013-06-25 (×2): 10 mg via ORAL
  Filled 2013-06-24 (×3): qty 1

## 2013-06-24 MED ORDER — LATANOPROST 0.005 % OP SOLN
1.0000 [drp] | Freq: Every day | OPHTHALMIC | Status: DC
  Administered 2013-06-24: 1 [drp] via OPHTHALMIC
  Filled 2013-06-24: qty 2.5

## 2013-06-24 MED ORDER — PANTOPRAZOLE SODIUM 40 MG PO TBEC
40.0000 mg | DELAYED_RELEASE_TABLET | Freq: Every day | ORAL | Status: DC
  Administered 2013-06-24: 40 mg via ORAL
  Filled 2013-06-24: qty 1

## 2013-06-24 MED ORDER — FUROSEMIDE 10 MG/ML IJ SOLN
INTRAMUSCULAR | Status: AC
  Administered 2013-06-24: 40 mg
  Filled 2013-06-24: qty 4

## 2013-06-24 MED ORDER — BISACODYL 10 MG RE SUPP: 10.0000 mg | Freq: Every day | RECTAL | Status: DC | PRN

## 2013-06-24 MED ORDER — LORATADINE 10 MG PO TABS
10.0000 mg | ORAL_TABLET | Freq: Every day | ORAL | Status: DC
  Administered 2013-06-25: 10 mg via ORAL
  Filled 2013-06-24 (×3): qty 1

## 2013-06-24 MED ORDER — POTASSIUM CHLORIDE CRYS ER 20 MEQ PO TBCR
20.0000 meq | EXTENDED_RELEASE_TABLET | Freq: Two times a day (BID) | ORAL | Status: DC
  Administered 2013-06-24 – 2013-06-25 (×3): 20 meq via ORAL
  Filled 2013-06-24 (×5): qty 1

## 2013-06-24 MED ORDER — OXYCODONE-ACETAMINOPHEN 5-325 MG PO TABS
1.0000 | ORAL_TABLET | Freq: Four times a day (QID) | ORAL | Status: DC | PRN
Start: 2013-06-24 — End: 2013-06-26
  Administered 2013-06-24 – 2013-06-25 (×2): 1 via ORAL
  Filled 2013-06-24 (×2): qty 1

## 2013-06-24 MED ORDER — LINAGLIPTIN 5 MG PO TABS
5.0000 mg | ORAL_TABLET | Freq: Every morning | ORAL | Status: DC
  Filled 2013-06-24 (×2): qty 1

## 2013-06-24 MED ORDER — INSULIN ASPART 100 UNIT/ML ~~LOC~~ SOLN: 0.0000 [IU] | Freq: Every day | SUBCUTANEOUS | Status: DC

## 2013-06-24 MED ORDER — BRIMONIDINE TARTRATE 0.2 % OP SOLN
1.0000 [drp] | Freq: Three times a day (TID) | OPHTHALMIC | Status: DC
  Administered 2013-06-24 – 2013-06-25 (×2): 1 [drp] via OPHTHALMIC
  Filled 2013-06-24 (×2): qty 5

## 2013-06-24 MED ORDER — ALPRAZOLAM 0.5 MG PO TABS
0.5000 mg | ORAL_TABLET | Freq: Every evening | ORAL | Status: DC | PRN
  Administered 2013-06-24 – 2013-06-25 (×2): 0.5 mg via ORAL
  Filled 2013-06-24 (×2): qty 1

## 2013-06-24 MED ORDER — FUROSEMIDE 10 MG/ML IJ SOLN
40.0000 mg | Freq: Two times a day (BID) | INTRAMUSCULAR | Status: DC
  Administered 2013-06-24 – 2013-06-25 (×3): 40 mg via INTRAVENOUS
  Filled 2013-06-24 (×5): qty 4

## 2013-06-24 MED ORDER — POLYETHYLENE GLYCOL 3350 17 G PO PACK
17.0000 g | PACK | Freq: Every day | ORAL | Status: DC
  Administered 2013-06-24 – 2013-06-25 (×2): 17 g via ORAL
  Filled 2013-06-24 (×3): qty 1

## 2013-06-24 NOTE — Assessment & Plan Note (Signed)
Her wgt is 85- last office weight 182 Sept 15th

## 2013-06-24 NOTE — Progress Notes (Signed)
06/24/2013 [name]   1935/05/26  [id number]  Primary Physicia Cassell Smiles., MD Primary Cardiologist: Dr Rennis Golden  HPI:  78 y/o, morbidly obese female with a history of right heart failure, pulmonary hypertension and obstructive sleep apnea, intolerant to CPAP. She has had multiple admissions this year for acute on chronic diastolic HF. Her last echo was on 01/12/2013, which demonstrated normal LV function, with an EF of 65-70% and grade I diastolic dysfunction. She last saw Dr Rennis Golden 05/10/13 and her wgt was 182. She is in the office today complaining of increasing dyspnea and dizziness when she gets up. She is pale, SOB, and tachycardic. Her stool  guiac is positive. She is admitted now for anemia, and acute on chronic diastolic CHF.    Current Outpatient Prescriptions  Medication Sig Dispense Refill  . ALPRAZolam (XANAX) 0.5 MG tablet Take 0.5 mg by mouth 2 (two) times daily. May take up to 4 times daily as prescribed      . aspirin 81 MG EC tablet Take 81 mg by mouth every morning.       . bisacodyl (DULCOLAX) 10 MG suppository Place 10 mg rectally as needed for constipation.      . brimonidine (ALPHAGAN P) 0.1 % SOLN Place 1 drop into both eyes daily.      Marland Kitchen docusate sodium (STOOL SOFTENER) 100 MG capsule Take 100 mg by mouth 2 (two) times daily.      Marland Kitchen ezetimibe (ZETIA) 10 MG tablet Take 10 mg by mouth at bedtime.        . fexofenadine (ALLEGRA) 180 MG tablet Take 180 mg by mouth every morning.      Marland Kitchen glipiZIDE (GLUCOTROL) 10 MG tablet Take 10 mg by mouth 2 (two) times daily before a meal.        . lansoprazole (PREVACID) 30 MG capsule Take 30 mg by mouth every morning.       Marland Kitchen levothyroxine (SYNTHROID, LEVOTHROID) 150 MCG tablet Take 150 mcg by mouth daily before breakfast.       . linagliptin (TRADJENTA) 5 MG TABS tablet Take 5 mg by mouth every morning.       Marland Kitchen oxyCODONE-acetaminophen (PERCOCET/ROXICET) 5-325 MG per tablet Take 1 tablet by mouth 2 (two) times daily. May take 1  additional dose if needed for pain      . OXYGEN-HELIUM IN Inhale 4 L into the lungs continuous.      . polyethylene glycol (MIRALAX / GLYCOLAX) packet Take 17 g by mouth daily.      . potassium chloride SA (K-DUR,KLOR-CON) 20 MEQ tablet Take 1 tablet (20 mEq total) by mouth 2 (two) times daily.      . pravastatin (PRAVACHOL) 20 MG tablet Take 20 mg by mouth at bedtime.       . torsemide (DEMADEX) 20 MG tablet Take 2 tabs once daily.  30 tablet  0  . Travoprost, BAK Free, (TRAVATAN) 0.004 % SOLN ophthalmic solution Place 1 drop into both eyes at bedtime.       No current facility-administered medications for this visit.    Allergies  Allergen Reactions  . Darvocet [Propoxyphene-Acetaminophen] Nausea And Vomiting  . Diphenhydramine Hcl Other (See Comments)    Glaucoma prevents patient from taking this medication   . Aloe Vera Rash  . Penicillins Swelling and Rash    History   Social History  . Marital Status: Widowed    Spouse Name: N/A    Number of Children: N/A  . Years of  Education: N/A   Occupational History  . Not on file.   Social History Main Topics  . Smoking status: Never Smoker   . Smokeless tobacco: Current User    Types: Snuff  . Alcohol Use: No  . Drug Use: No  . Sexual Activity: Not on file   Other Topics Concern  . Not on file   Social History Narrative   8 th grade education   Previous job: farmer    Lives with brother   Denies cigarettes, etOH, drugs. Uses snuff since age 22   FmHx-CAD, HTN, DM  Review of Systems: General: negative for chills, fever, night sweats or weight changes.  Dermatological: negative for rash Respiratory: negative for cough or wheezing Urologic: negative for hematuria Abdominal: negative for nausea, vomiting, diarrhea, bright red blood per rectum, or hematemesis Neurologic: negative for visual changes, syncope, or dizziness All other systems reviewed and are otherwise negative except as noted above.    Blood pressure  130/72, pulse 101, height 5\' 1"  (1.549 m), weight 207 lb (93.895 kg).  General appearance: alert, cooperative, mild distress, morbidly obese and pale Neck: no carotid bruit and no JVD Lungs: clear to auscultation bilaterally Heart: regular rate and rhythm Abdomen: obese Extremities: trace edema Pulses: diminnished Skin: pale, cool, dry Neurologic: Grossly normal  EKG NSR, ST, incomplete RBBB  ASSESSMENT AND PLAN:   GI bleed Pale, tachycardic, SOB, guaiac positive stool  Weakness Secondary to above  Diastolic CHF, chronic- EF 65-70% May 2014 Her wgt is 207- last office weight 182 Sept 15th  Normal coronary arteries at cath 2011 .  DM2 (diabetes mellitus, type 2) .  Obesity, morbid (more than 100 lbs over ideal weight or BMI > 40) .  Chronic respiratory failure with hypoxia .  Pulmonary hypertension, severe; Severe by Echo 02/1713 .   PLAN  Admit, Type and cross, Iv diuetics  [name] KPA-C 06/24/2013 4:50 PM  Pt. Seen and examined. Agree with the NP/PA-C note as written.  I saw her in the hospital last week while taking care of her brother. She was complaining of fatigue and shortness of breath.  She was more symptomatic today and is more pale and markedly dyspneic. Stool guaiac in the office is positive, therefore, this could represent possible GI bleeding versus her typical diastolic HF superimposed on pulmonary fibrosis.  Plan to admit for further labs, work-up and transfusion if necessary.  Chrystie Nose, MD, [place] Attending Cardiologist [facility]

## 2013-06-24 NOTE — H&P (Signed)
06/24/2013  [name]  Nov 25, 1934  [id number]   Primary Physician: Cassell Smiles., MD  Primary Cardiologist: Dr Rennis Golden   HPI: 78 y/o, morbidly obese female with a history of right heart failure, pulmonary hypertension and obstructive sleep apnea, intolerant to CPAP. She has had multiple admissions this year for acute on chronic diastolic HF. Her last echo was on 01/12/2013, which demonstrated normal LV function, with an EF of 65-70% and grade I diastolic dysfunction. She last saw Dr Rennis Golden 05/10/13 and her wgt was 182. She is in the office today complaining of increasing dyspnea and dizziness when she gets up. She is pale, SOB, and tachycardic. Her stool guiac is positive. She is admitted now for anemia, and acute on chronic diastolic CHF.    Current Outpatient Prescriptions   Medication  Sig  Dispense  Refill   .  ALPRAZolam (XANAX) 0.5 MG tablet  Take 0.5 mg by mouth 2 (two) times daily. May take up to 4 times daily as prescribed     .  aspirin 81 MG EC tablet  Take 81 mg by mouth every morning.     .  bisacodyl (DULCOLAX) 10 MG suppository  Place 10 mg rectally as needed for constipation.     .  brimonidine (ALPHAGAN P) 0.1 % SOLN  Place 1 drop into both eyes daily.     Marland Kitchen  docusate sodium (STOOL SOFTENER) 100 MG capsule  Take 100 mg by mouth 2 (two) times daily.     Marland Kitchen  ezetimibe (ZETIA) 10 MG tablet  Take 10 mg by mouth at bedtime.     .  fexofenadine (ALLEGRA) 180 MG tablet  Take 180 mg by mouth every morning.     Marland Kitchen  glipiZIDE (GLUCOTROL) 10 MG tablet  Take 10 mg by mouth 2 (two) times daily before a meal.     .  lansoprazole (PREVACID) 30 MG capsule  Take 30 mg by mouth every morning.     Marland Kitchen  levothyroxine (SYNTHROID, LEVOTHROID) 150 MCG tablet  Take 150 mcg by mouth daily before breakfast.     .  linagliptin (TRADJENTA) 5 MG TABS tablet  Take 5 mg by mouth every morning.     Marland Kitchen  oxyCODONE-acetaminophen (PERCOCET/ROXICET) 5-325 MG per tablet  Take 1 tablet by mouth 2 (two) times  daily. May take 1 additional dose if needed for pain     .  OXYGEN-HELIUM IN  Inhale 4 L into the lungs continuous.     .  polyethylene glycol (MIRALAX / GLYCOLAX) packet  Take 17 g by mouth daily.     .  potassium chloride SA (K-DUR,KLOR-CON) 20 MEQ tablet  Take 1 tablet (20 mEq total) by mouth 2 (two) times daily.     .  pravastatin (PRAVACHOL) 20 MG tablet  Take 20 mg by mouth at bedtime.     .  torsemide (DEMADEX) 20 MG tablet  Take 2 tabs once daily.  30 tablet  0   .  Travoprost, BAK Free, (TRAVATAN) 0.004 % SOLN ophthalmic solution  Place 1 drop into both eyes at bedtime.      No current facility-administered medications for this visit.    Allergies   Allergen  Reactions   .  Darvocet [Propoxyphene-Acetaminophen]  Nausea And Vomiting   .  Diphenhydramine Hcl  Other (See Comments)     Glaucoma prevents patient from taking this medication   .  Aloe Vera  Rash   .  Penicillins  Swelling and Rash    History    Social History   .  Marital Status:  Widowed     Spouse Name:  N/A     Number of Children:  N/A   .  Years of Education:  N/A    Occupational History   .  Not on file.    Social History Main Topics   .  Smoking status:  Never Smoker   .  Smokeless tobacco:  Current User     Types:  Snuff   .  Alcohol Use:  No   .  Drug Use:  No   .  Sexual Activity:  Not on file    Other Topics  Concern   .  Not on file    Social History Narrative    8 th grade education    Previous job: farmer    Lives with brother    Denies cigarettes, etOH, drugs. Uses snuff since age 63   FmHx-CAD, HTN, DM  Review of Systems:  General: negative for chills, fever, night sweats or weight changes.  Dermatological: negative for rash  Respiratory: negative for cough or wheezing  Urologic: negative for hematuria  Abdominal: negative for nausea, vomiting, diarrhea, bright red blood per rectum, or hematemesis  Neurologic: negative for visual changes, syncope, or dizziness  All other systems  reviewed and are otherwise negative except as noted above.  Blood pressure 130/72, pulse 101, height 5\' 1"  (1.549 m), weight 207 lb (93.895 kg).  General appearance: alert, cooperative, mild distress, morbidly obese and pale  Neck: no carotid bruit and no JVD  Lungs: clear to auscultation bilaterally  Heart: regular rate and rhythm  Abdomen: obese  Extremities: trace edema  Pulses: diminnished  Skin: pale, cool, dry  Neurologic: Grossly normal   EKG NSR, ST, incomplete RBBB   ASSESSMENT AND PLAN:   GI bleed  Pale, tachycardic, SOB, guaiac positive stool   Weakness  Secondary to above   Diastolic CHF, chronic- EF 65-70% May 2014  Her wgt is 207- last office weight 182 Sept 15th   Normal coronary arteries at cath 2011  .  DM2 (diabetes mellitus, type 2)  .  Obesity, morbid (more than 100 lbs over ideal weight or BMI > 40)  .  Chronic respiratory failure with hypoxia  .  Pulmonary hypertension, severe; Severe by Echo 02/1713  .  PLAN Admit, Type and cross, Iv diuetics  KILROY,LUKE KPA-C  06/24/2013  4:50 PM   Pt. Seen and examined. Agree with the NP/PA-C note as written. I saw her in the hospital last week while taking care of her brother. She was complaining of fatigue and shortness of breath. She was more symptomatic today and is more pale and markedly dyspneic. Stool guaiac in the office is positive, therefore, this could represent possible GI bleeding versus her typical diastolic HF superimposed on pulmonary fibrosis. Plan to admit for further labs, work-up and transfusion if necessary.  Chrystie Nose, MD, [facility]  Attending Cardiologist  [facility]

## 2013-06-24 NOTE — Assessment & Plan Note (Signed)
Pale, tachycardic, SOB, guaiac positive stool

## 2013-06-24 NOTE — Assessment & Plan Note (Signed)
Secondary to above 

## 2013-06-24 NOTE — Progress Notes (Signed)
Pt arrived without problems, sitting on side of bed and complains of dizziness.  Labs pending.

## 2013-06-25 ENCOUNTER — Encounter (HOSPITAL_COMMUNITY)
Admission: AD | Disposition: A | Discharge status: Home / Self Care | Payer: Self-pay | Source: Ambulatory Visit | Attending: Internal Medicine

## 2013-06-25 ENCOUNTER — Encounter (HOSPITAL_COMMUNITY): Payer: Self-pay | Admitting: *Deleted

## 2013-06-25 DIAGNOSIS — K922 Gastrointestinal hemorrhage, unspecified: Secondary | ICD-10-CM

## 2013-06-25 DIAGNOSIS — I2789 Other specified pulmonary heart diseases: Secondary | ICD-10-CM

## 2013-06-25 DIAGNOSIS — I509 Heart failure, unspecified: Secondary | ICD-10-CM

## 2013-06-25 DIAGNOSIS — I5032 Chronic diastolic (congestive) heart failure: Secondary | ICD-10-CM

## 2013-06-25 DIAGNOSIS — R5381 Other malaise: Secondary | ICD-10-CM

## 2013-06-25 DIAGNOSIS — D62 Acute posthemorrhagic anemia: Secondary | ICD-10-CM

## 2013-06-25 HISTORY — PX: ESOPHAGOGASTRODUODENOSCOPY: SHX5428

## 2013-06-25 LAB — BASIC METABOLIC PANEL
BUN: 21 mg/dL (ref 6–23)
CO2: 24 mEq/L (ref 19–32)
Calcium: 9.1 mg/dL (ref 8.4–10.5)
Creatinine, Ser: 0.85 mg/dL (ref 0.50–1.10)
GFR calc Af Amer: 75 mL/min — ABNORMAL LOW (ref >90)
GFR calc non Af Amer: 65 mL/min — ABNORMAL LOW (ref >90)
Glucose, Bld: 117 mg/dL — ABNORMAL HIGH (ref 70–99)

## 2013-06-25 LAB — CBC
Hemoglobin: 9.3 g/dL — ABNORMAL LOW (ref 12.0–15.0)
MCH: 26.4 pg (ref 26.0–34.0)
MCHC: 32.7 g/dL (ref 30.0–36.0)
MCV: 80.7 fL (ref 78.0–100.0)
Platelets: 117 10*3/uL — ABNORMAL LOW (ref 150–400)
RBC: 3.52 MIL/uL — ABNORMAL LOW (ref 3.87–5.11)
RDW: 15.2 % (ref 11.5–15.5)

## 2013-06-25 LAB — HEMOGLOBIN AND HEMATOCRIT, BLOOD: HCT: 31.9 % — ABNORMAL LOW (ref 36.0–46.0)

## 2013-06-25 LAB — GLUCOSE, CAPILLARY
Glucose-Capillary: 96 mg/dL (ref 70–99)
Glucose-Capillary: 190 mg/dL — ABNORMAL HIGH (ref 70–99)

## 2013-06-25 SURGERY — EGD (ESOPHAGOGASTRODUODENOSCOPY)
Anesthesia: Moderate Sedation

## 2013-06-25 MED ORDER — PANTOPRAZOLE SODIUM 40 MG PO TBEC
40.0000 mg | DELAYED_RELEASE_TABLET | Freq: Two times a day (BID) | ORAL | Status: DC
  Administered 2013-06-25: 40 mg via ORAL
  Filled 2013-06-25 (×2): qty 1

## 2013-06-25 MED ORDER — BUTAMBEN-TETRACAINE-BENZOCAINE 2-2-14 % EX AERO
INHALATION_SPRAY | CUTANEOUS | Status: DC | PRN
  Administered 2013-06-25: 2 via TOPICAL
  Administered 2013-06-25: 1 via TOPICAL

## 2013-06-25 MED ORDER — SODIUM CHLORIDE 0.9 % IV SOLN: INTRAVENOUS | Status: DC

## 2013-06-25 NOTE — Progress Notes (Signed)
Endoscopy findings discussed with Dr. Rennis Golden. Please see dictated procedure report for findings and recommendations.  Review of EPIC records shows that the patient had endoscopy, showing antral nodularity, in 2005 and 2008. She had a negative colonoscopy in 2005.  I will sign off at this time. Please call if I can be of further assistance with this patient.  Florencia Reasons, M.D. [phone]

## 2013-06-25 NOTE — Progress Notes (Addendum)
DAILY PROGRESS NOTE  Subjective:  No events overnight. Labs indicated anemia, secondary to presumed GI bleed. Guaiac positive in the office. Received 2u PRBC's.  Feels much better today. H/H up from 7.7/25.1 and 9.3/28.4, indicated a less than expected response.  In retrospect, she has had dark stools at home, but attributed it to her medicines.  Objective:  Temp:  [97 F (36.1 C)-98.5 F (36.9 C)] 98.2 F (36.8 C) (10/31 0400) Pulse Rate:  [89-108] 91 (10/31 0700) Resp:  [15-24] 15 (10/31 0700) BP: (90-130)/(43-72) 92/49 mmHg (10/31 0700) SpO2:  [82 %-97 %] 94 % (10/31 0700) Weight:  [207 lb (93.895 kg)-208 lb 8.9 oz (94.6 kg)] 208 lb 8.9 oz (94.6 kg) (10/30 1800) Weight change:   Intake/Output from previous day: 10/30 0701 - 10/31 0700 In: 765 [Blood:765] Out: 1925 [Urine:1925]  Intake/Output from this shift:    Medications: Current Facility-Administered Medications  Medication Dose Route Frequency Provider Last Rate Last Dose  . ALPRAZolam Prudy Feeler) tablet 0.5 mg  0.5 mg Oral QHS PRN Leeann Must, MD   0.5 mg at 06/24/13 2302  . aspirin chewable tablet 81 mg  81 mg Oral q morning - 10a Luke K Kilroy, PA-C      . bisacodyl (DULCOLAX) suppository 10 mg  10 mg Rectal Daily PRN Abelino Derrick, PA-C      . brimonidine (ALPHAGAN) 0.2 % ophthalmic solution 1 drop  1 drop Both Eyes TID Abelino Derrick, PA-C   1 drop at 06/24/13 2149  . docusate sodium (COLACE) capsule 100 mg  100 mg Oral BID Abelino Derrick, PA-C   100 mg at 06/24/13 2145  . ezetimibe (ZETIA) tablet 10 mg  10 mg Oral QHS Abelino Derrick, PA-C   10 mg at 06/24/13 2144  . furosemide (LASIX) injection 40 mg  40 mg Intravenous BID Abelino Derrick, PA-C   40 mg at 06/24/13 2012  . glipiZIDE (GLUCOTROL) tablet 10 mg  10 mg Oral BID AC Luke K Kilroy, PA-C      . insulin aspart (novoLOG) injection 0-15 Units  0-15 Units Subcutaneous TID WC Luke K Kilroy, PA-C      . insulin aspart (novoLOG) injection 0-5 Units  0-5 Units  Subcutaneous QHS Luke K Kilroy, PA-C      . latanoprost (XALATAN) 0.005 % ophthalmic solution 1 drop  1 drop Both Eyes QHS Abelino Derrick, PA-C   1 drop at 06/24/13 2148  . levothyroxine (SYNTHROID, LEVOTHROID) tablet 150 mcg  150 mcg Oral QAC breakfast Luke K Kilroy, PA-C      . linagliptin (TRADJENTA) tablet 5 mg  5 mg Oral q morning - 10a Luke K Kilroy, PA-C      . loratadine (CLARITIN) tablet 10 mg  10 mg Oral Daily Luke K Kilroy, PA-C      . oxyCODONE-acetaminophen (PERCOCET/ROXICET) 5-325 MG per tablet 1 tablet  1 tablet Oral Q6H PRN Chrystie Nose, MD   1 tablet at 06/24/13 2303  . pantoprazole (PROTONIX) EC tablet 40 mg  40 mg Oral Daily Chrystie Nose, MD   40 mg at 06/24/13 2244  . polyethylene glycol (MIRALAX / GLYCOLAX) packet 17 g  17 g Oral Daily Abelino Derrick, PA-C   17 g at 06/24/13 2147  . potassium chloride SA (K-DUR,KLOR-CON) CR tablet 20 mEq  20 mEq Oral BID Abelino Derrick, PA-C   20 mEq at 06/24/13 2244  . simvastatin (ZOCOR) tablet 5 mg  5 mg  Oral q1800 Abelino Derrick, PA-C   5 mg at 06/24/13 2144    Physical Exam: General appearance: alert, no distress and moderately obese Neck: no carotid bruit and no JVD Lungs: diminished breath sounds bilaterally Heart: regular rate and rhythm, S1, S2 normal, no murmur, click, rub or gallop Abdomen: soft, non-tender; bowel sounds normal; no masses,  no organomegaly and obese, no guarding Extremities: extremities normal, atraumatic, no cyanosis or edema Pulses: 2+ and symmetric Skin: Skin appears less pale Neurologic: Mental status: Alert, oriented, thought content appropriate Psych: Pleasant, no distress  Lab Results: Results for orders placed during the hospital encounter of 06/24/13 (from the past 48 hour(s))  MRSA PCR SCREENING     Status: None   Collection Time    06/24/13  6:20 PM      Result Value Range   MRSA by PCR NEGATIVE  NEGATIVE   Comment:            The GeneXpert MRSA Assay (FDA     approved for NASAL  specimens     only), is one component of a     comprehensive MRSA colonization     surveillance program. It is not     intended to diagnose MRSA     infection nor to guide or     monitor treatment for     MRSA infections.  CBC     Status: Abnormal   Collection Time    06/24/13  6:25 PM      Result Value Range   WBC 6.5  4.0 - 10.5 K/uL   RBC 3.08 (*) 3.87 - 5.11 MIL/uL   Hemoglobin 7.7 (*) 12.0 - 15.0 g/dL   HCT 16.1 (*) 09.6 - 04.5 %   MCV 81.5  78.0 - 100.0 fL   MCH 25.0 (*) 26.0 - 34.0 pg   MCHC 30.7  30.0 - 36.0 g/dL   RDW 40.9  81.1 - 91.4 %   Platelets 162  150 - 400 K/uL  COMPREHENSIVE METABOLIC PANEL     Status: Abnormal   Collection Time    06/24/13  6:25 PM      Result Value Range   Sodium 140  135 - 145 mEq/L   Potassium 4.1  3.5 - 5.1 mEq/L   Chloride 104  96 - 112 mEq/L   CO2 23  19 - 32 mEq/L   Glucose, Bld 147 (*) 70 - 99 mg/dL   BUN 23  6 - 23 mg/dL   Creatinine, Ser 7.82  0.50 - 1.10 mg/dL   Calcium 9.1  8.4 - 95.6 mg/dL   Total Protein 6.4  6.0 - 8.3 g/dL   Albumin 3.7  3.5 - 5.2 g/dL   AST 23  0 - 37 U/L   ALT 12  0 - 35 U/L   Alkaline Phosphatase 166 (*) 39 - 117 U/L   Total Bilirubin 0.4  0.3 - 1.2 mg/dL   GFR calc non Af Amer 58 (*) >90 mL/min   GFR calc Af Amer 67 (*) >90 mL/min   Comment: (NOTE)     The eGFR has been calculated using the CKD EPI equation.     This calculation has not been validated in all clinical situations.     eGFR's persistently <90 mL/min signify possible Chronic Kidney     Disease.  TYPE AND SCREEN     Status: None   Collection Time    06/24/13  6:25 PM      Result  Value Range   ABO/RH(D) O POS     Antibody Screen NEG     Sample Expiration 06/27/2013     Unit Number [id number]     Blood Component Type RED CELLS,LR     Unit division 00     Status of Unit ISSUED     Transfusion Status OK TO TRANSFUSE     Crossmatch Result Compatible     Unit Number [id number]     Blood Component Type RBC LR PHER2     Unit  division 00     Status of Unit ISSUED     Transfusion Status OK TO TRANSFUSE     Crossmatch Result Compatible    PRO B NATRIURETIC PEPTIDE     Status: Abnormal   Collection Time    06/24/13  6:25 PM      Result Value Range   Pro B Natriuretic peptide (BNP) 2425.0 (*) 0 - 450 pg/mL  PROTIME-INR     Status: None   Collection Time    06/24/13  6:25 PM      Result Value Range   Prothrombin Time 15.0  11.6 - 15.2 seconds   INR 1.21  0.00 - 1.49  TSH     Status: Abnormal   Collection Time    06/24/13  6:25 PM      Result Value Range   TSH 0.057 (*) 0.350 - 4.500 uIU/mL   Comment: Performed at [facility]  ABO/RH     Status: None   Collection Time    06/24/13  6:25 PM      Result Value Range   ABO/RH(D) O POS    PREPARE RBC (CROSSMATCH)     Status: None   Collection Time    06/24/13  8:00 PM      Result Value Range   Order Confirmation ORDER PROCESSED BY BLOOD BANK    GLUCOSE, CAPILLARY     Status: Abnormal   Collection Time    06/24/13 10:14 PM      Result Value Range   Glucose-Capillary 105 (*) 70 - 99 mg/dL   Comment 1 Documented in Chart     Comment 2 Notify RN    CBC     Status: Abnormal   Collection Time    06/25/13  5:30 AM      Result Value Range   WBC 4.7  4.0 - 10.5 K/uL   RBC 3.52 (*) 3.87 - 5.11 MIL/uL   Hemoglobin 9.3 (*) 12.0 - 15.0 g/dL   Comment: REPEATED TO VERIFY   HCT 28.4 (*) 36.0 - 46.0 %   MCV 80.7  78.0 - 100.0 fL   MCH 26.4  26.0 - 34.0 pg   MCHC 32.7  30.0 - 36.0 g/dL   RDW 30.8  65.7 - 84.6 %   Platelets 117 (*) 150 - 400 K/uL   Comment: PLATELET COUNT CONFIRMED BY SMEAR     RESULT REPEATED AND VERIFIED  BASIC METABOLIC PANEL     Status: Abnormal   Collection Time    06/25/13  5:30 AM      Result Value Range   Sodium 140  135 - 145 mEq/L   Potassium 3.8  3.5 - 5.1 mEq/L   Chloride 104  96 - 112 mEq/L   CO2 24  19 - 32 mEq/L   Glucose, Bld 117 (*) 70 - 99 mg/dL   BUN 21  6 - 23 mg/dL   Creatinine, Ser 9.62  0.50 -  1.10 mg/dL     Calcium 9.1  8.4 - 10.5 mg/dL   GFR calc non Af Amer 65 (*) >90 mL/min   GFR calc Af Amer 75 (*) >90 mL/min   Comment: (NOTE)     The eGFR has been calculated using the CKD EPI equation.     This calculation has not been validated in all clinical situations.     eGFR's persistently <90 mL/min signify possible Chronic Kidney     Disease.    Imaging: No results found.  Assessment:  Principal Problem:   Acute GI bleeding Active Problems:   Chronic respiratory failure with hypoxia   DM2 (diabetes mellitus, type 2)   Obesity, morbid (more than 100 lbs over ideal weight or BMI > 40)   Pulmonary hypertension, severe; Severe by Echo 02/1713   Hypotension   Diastolic CHF, chronic- EF 65-70% May 2014   Weakness   Acute blood loss anemia   Plan:  1. Feels better after transfusion, although absolute increase in H/H is less than expected. She likely has some ongoing bleeding. GI evaluation today - she has seen Dr. Jena Gauss in [place] in the past, but no one in [place]. Her HCT tends to run higher due to her lung disease, so her anemia is significant for her. Low threshold to transfuse additional units if she continues to drift down. Re-check H/H later today. D/c Aspirin and change protonix to 40 mg po BID. Appears to be clinically compensated with chronic diastolic heart failure.  Time Spent Directly with Patient:  15 minutes  Length of Stay:  LOS: 1 day   Chrystie Nose, MD, [facility] Attending Cardiologist [facility]  HILTY,Kenneth C 06/25/2013, 7:50 AM

## 2013-06-25 NOTE — Care Management Note (Signed)
    Page 1 of 1   06/25/2013     8:14:36 AM   CARE MANAGEMENT NOTE 06/25/2013  Patient:  [name], [name]   Account Number:  [id number]  Date Initiated:  06/25/2013  Documentation initiated by:  Junius Creamer  Subjective/Objective Assessment:   adm w anemia     Action/Plan:   lives w fam, pcp dr lawrence fusco, chart states has aide   Anticipated DC Date:     Anticipated DC Plan:        DC Planning Services  CM consult      Choice offered to / List presented to:             Status of service:   Medicare Important Message given?   (If response is "NO", the following Medicare IM given date fields will be blank) Date Medicare IM given:   Date Additional Medicare IM given:    Discharge Disposition:    Per UR Regulation:  Reviewed for med. necessity/level of care/duration of stay  If discussed at Long Length of Stay Meetings, dates discussed:    Comments:

## 2013-06-25 NOTE — Op Note (Signed)
[facility] [address] [place], [address]   ENDOSCOPY PROCEDURE REPORT  PATIENT: [name], [name]  MR#: [id number] BIRTHDATE: 1934-12-24 , 78  yrs. old GENDER: Female ENDOSCOPIST:Pasquale Matters, MD REFERRED BY:  Dr. [name] PROCEDURE DATE:  06/25/2013 PROCEDURE:      upper endoscopy ASA CLASS: INDICATIONS:   5 g drop in hemoglobin over the past 6 weeks, dark stools, heme positive, severe anemia MEDICATION:    none TOPICAL ANESTHETIC:    Cetacaine spray x3  DESCRIPTION OF PROCEDURE:   the nature, purpose, and risks of the procedure have been discussed with the patient who provided written consent. She was brought from her hospital room to the [facility] cone endoscopy unit. Time was performed. The procedure was done with no sedation, because of her chronic medical illnesses which include sleep apnea, chronic hypoxia necessitating home oxygen, and severe pulmonary hypertension. She tolerated the procedure quite well despite the absence of sedation.  The Pentax pediatric video upper endoscope was passed under direct vision. The vocal cords were not well seen. The esophagus was entered without significant difficulty in the second attempt (after additional Cetacaine anesthesia).  The esophagus was normal. No Mallory-Weiss tear, varices, reflux esophagitis, infection, neoplasia, Barrett's esophagus, significant hiatal hernia, or stricture were evident.  The stomach was entered. It contained no blood or coffee-ground material.  However, the stomach did have multiple polypoid, erythematous nodules particularly in the antrum, +1 in the mid body of the stomach. Review of the report to the patient's previous endoscopies in 2005 and 2008, by Dr. Arma Heading in [place], [place], had shown similar findings, with biopsies on the second occasion showing benign, hyperplastic changes without dysplasia or intestinal metaplasia.  No erosive changes, ulcerations,  or vascular ectasia were observed to account for the patient's significant drop in hemoglobin.  The pylorus, duodenal bulb, and second duodenum looked normal.  The scope was removed from the patient. No biopsies were obtained.     COMPLICATIONS: None  ENDOSCOPIC IMPRESSION:  1. No active bleeding or blood in the stomach at the time of this evaluation. 2. Marked antral nodularity, benign in appearance and benign on biopsies from 2008 3. No obvious source of hemorrhage identified. No erosions or ulcers, no vascular ectasia. The observed nodularity, which  has been there for many years based on previous endoscopy reports, appears to be nonhemorrhagic in character. 4. It is possible that the patient had a previous erosion or superficial ulceration which had healed by the time of this examination, or that the patient's blood loss is coming from the small bowel or colon.  RECOMMENDATIONS:  I have discussed the case with Dr. Rennis Golden.  I do not see any endoscopic contraindicationto resumption of antiplatelet therapy.  However, since the bleeding source in this patient has not been identified, it is possible that she will have a recurrence. Therefore, I think she will need to have fairly close monitoring of hemoglobin and Hemoccult status.  Under ordinary circumstances, this patient would be a candidate for a small bowel capsule endoscopy and perhaps for a repeat colonoscopy (her colonoscopy 9 years ago in [place] was negative except for internal hemorrhoids). However, realistically, it is unlikely that the patient could tolerate a colonoscopy, and a small bowel capsule endoscopy would not likely show any treatable lesions. Therefore, I think her management will have to be more empiric.  As Dr. Rennis Golden and I discussed, Plavix might be better than aspirin for this patient, since it would not  have ulcerogenic effects.   _______________________________ Rosalie DoctorBernette Redbird,  MD 06/25/2013 5:06 PM    PATIENT NAME:  [name], [name] MR#: [id number]

## 2013-06-25 NOTE — Progress Notes (Signed)
To  Endoscopy by bed, awake and alert.

## 2013-06-25 NOTE — Progress Notes (Signed)
Back from the endoscopy room awake and alert.

## 2013-06-25 NOTE — Consult Note (Signed)
Referring Provider: Dr. Ellene Route Primary Care Physician:  Cassell Smiles., MD Primary Gastroenterologist:  Dr. Gaetana Michaelis [facility])  Reason for Consultation:  Melena, anemia  HPI: [name] is a 78 y.o. female admitted to the hospital yesterday because of a significant decline in hemoglobin associated with severe dyspnea. The patient's hemoglobin on August 11 was 12.7, on admission yesterday it was 7.7. She is on a daily aspirin but also indicates that she takes lansoprazole 30 mg each morning. No prior history of ulcer disease or GI bleeding. She did have an endoscopy some years ago by Dr. Benard Rink in [place] and was told not to take aspirin. She also indicates she had a colonoscopy about 5 years ago, and I am attempting to get the records pertaining to that procedure.  The patient has no active upper or lower tract symptoms, although her son, who is at the bedside, indicates that her appetite is not as good as it has been previously. Specifically, no dysphagia, frank anorexia, significant weight loss (actually has gained weight recently, by the report), nausea, abdominal pain, or dyspepsia. From the lower tract perspective, no recent change in bowel habits (does have some irregularity, alternating between constipation and diarrhea, occasional use of laxatives). She did note dark stools recently.  As noted below, the patient has numerous medical problems including significant obesity, chronic lung disease on home oxygen, right heart failure, and sleep apnea.  Since admission, she has received a transfusion of 2 units of packed red cells. Her hemoglobin has not come up quite as high as would be expected for the transfusion. Her stool has been Hemoccult positive but there has been no active bleeding. Sure   Past Medical History  Diagnosis Date  . Diabetes mellitus   . Hypertension   . Shingles   . GERD (gastroesophageal reflux disease)   . Abnormal heart rhythms   . Thyroid disease      hypthyroidism  . Pulmonary hypertension, moderate to severe     No evidence of CAD on Cath 2011  . Obesity, morbid (more than 100 lbs over ideal weight or BMI > 40)   . Chronic respiratory failure with hypoxia 01/23/2012    Related to OHS  . Glaucoma   . CHF (congestive heart failure)     Echo at [facility] 10/14/2012 see notes  . Acute on chronic renal failure     Past Surgical History  Procedure Laterality Date  . Tubal ligation    . Ovary removed    . Knee arthroscopy      left  . Cardiovascular stress test  09/15/2006    EF 81%; normal perfusion all regions; LV normal in size; no scintigraphic evidence of inducible myocardial ischemia  . Cardiac catheterization  02/21/2010    EF 65%; PA pressure 86/21 with mean of 44; normal coronary arteries, primary pumonary hypertension; no significant wall motion abnormalities    Prior to Admission medications   Medication Sig Start Date End Date Taking? Authorizing Provider  ALPRAZolam Prudy Feeler) 0.5 MG tablet Take 0.5 mg by mouth 2 (two) times daily. May take up to 4 times daily as prescribed   Yes Historical Provider, MD  aspirin 81 MG EC tablet Take 81 mg by mouth every morning.  10/20/12  Yes Brittainy Simmons, PA-C  bisacodyl (DULCOLAX) 10 MG suppository Place 10 mg rectally as needed for constipation.   Yes Historical Provider, MD  brimonidine (ALPHAGAN P) 0.1 % SOLN Place 1 drop into both eyes daily.  Yes Historical Provider, MD  docusate sodium (STOOL SOFTENER) 100 MG capsule Take 100 mg by mouth 2 (two) times daily.   Yes Historical Provider, MD  ezetimibe (ZETIA) 10 MG tablet Take 10 mg by mouth at bedtime.     Yes Historical Provider, MD  fexofenadine (ALLEGRA) 180 MG tablet Take 180 mg by mouth every morning.   Yes Historical Provider, MD  glipiZIDE (GLUCOTROL) 10 MG tablet Take 10 mg by mouth 2 (two) times daily before a meal.     Yes Historical Provider, MD  lansoprazole (PREVACID) 30 MG capsule Take 30 mg by mouth every morning.    Yes  Historical Provider, MD  levothyroxine (SYNTHROID, LEVOTHROID) 150 MCG tablet Take 150 mcg by mouth daily before breakfast.    Yes Historical Provider, MD  linagliptin (TRADJENTA) 5 MG TABS tablet Take 5 mg by mouth every morning.    Yes Historical Provider, MD  oxyCODONE-acetaminophen (PERCOCET/ROXICET) 5-325 MG per tablet Take 1 tablet by mouth 2 (two) times daily. May take 1 additional dose if needed for pain   Yes Historical Provider, MD  polyethylene glycol (MIRALAX / GLYCOLAX) packet Take 17 g by mouth daily.   Yes Historical Provider, MD  potassium chloride SA (K-DUR,KLOR-CON) 20 MEQ tablet Take 40 mEq by mouth daily.   Yes Historical Provider, MD  pravastatin (PRAVACHOL) 20 MG tablet Take 20 mg by mouth at bedtime.    Yes Historical Provider, MD  torsemide (DEMADEX) 20 MG tablet Take 40 mg by mouth daily.   Yes Historical Provider, MD  Travoprost, BAK Free, (TRAVATAN) 0.004 % SOLN ophthalmic solution Place 1 drop into both eyes at bedtime.   Yes Historical Provider, MD  OXYGEN-HELIUM IN Inhale 4 L into the lungs continuous.    Historical Provider, MD    Current Facility-Administered Medications  Medication Dose Route Frequency Provider Last Rate Last Dose  . ALPRAZolam Prudy Feeler) tablet 0.5 mg  0.5 mg Oral QHS PRN Leeann Must, MD   0.5 mg at 06/24/13 2302  . bisacodyl (DULCOLAX) suppository 10 mg  10 mg Rectal Daily PRN Abelino Derrick, PA-C      . brimonidine (ALPHAGAN) 0.2 % ophthalmic solution 1 drop  1 drop Both Eyes TID Abelino Derrick, PA-C   1 drop at 06/25/13 0901  . docusate sodium (COLACE) capsule 100 mg  100 mg Oral BID Eda Paschal Kilroy, PA-C   100 mg at 06/25/13 0901  . ezetimibe (ZETIA) tablet 10 mg  10 mg Oral QHS Abelino Derrick, PA-C   10 mg at 06/24/13 2144  . furosemide (LASIX) injection 40 mg  40 mg Intravenous BID Abelino Derrick, PA-C   40 mg at 06/25/13 4132  . glipiZIDE (GLUCOTROL) tablet 10 mg  10 mg Oral BID AC Luke K Kilroy, PA-C      . insulin aspart (novoLOG) injection 0-15  Units  0-15 Units Subcutaneous TID WC Abelino Derrick, PA-C   2 Units at 06/25/13 1311  . insulin aspart (novoLOG) injection 0-5 Units  0-5 Units Subcutaneous QHS Luke K Kilroy, PA-C      . latanoprost (XALATAN) 0.005 % ophthalmic solution 1 drop  1 drop Both Eyes QHS Abelino Derrick, PA-C   1 drop at 06/24/13 2148  . levothyroxine (SYNTHROID, LEVOTHROID) tablet 150 mcg  150 mcg Oral QAC breakfast Abelino Derrick, PA-C   150 mcg at 06/25/13 4401  . linagliptin (TRADJENTA) tablet 5 mg  5 mg Oral q morning - 10a Abelino Derrick,  PA-C      . loratadine (CLARITIN) tablet 10 mg  10 mg Oral Daily Abelino Derrick, PA-C   10 mg at 06/25/13 0901  . oxyCODONE-acetaminophen (PERCOCET/ROXICET) 5-325 MG per tablet 1 tablet  1 tablet Oral Q6H PRN Chrystie Nose, MD   1 tablet at 06/24/13 2303  . pantoprazole (PROTONIX) EC tablet 40 mg  40 mg Oral BID Chrystie Nose, MD   40 mg at 06/25/13 1021  . polyethylene glycol (MIRALAX / GLYCOLAX) packet 17 g  17 g Oral Daily Abelino Derrick, PA-C   17 g at 06/25/13 0901  . potassium chloride SA (K-DUR,KLOR-CON) CR tablet 20 mEq  20 mEq Oral BID Abelino Derrick, PA-C   20 mEq at 06/25/13 0901  . simvastatin (ZOCOR) tablet 5 mg  5 mg Oral q1800 Eda Paschal [place], PA-C   5 mg at 06/24/13 2144    Allergies as of 06/24/2013 - Review Complete 06/24/2013  Allergen Reaction Noted  . Darvocet [propoxyphene-acetaminophen] Nausea And Vomiting 03/08/2013  . Diphenhydramine hcl Other (See Comments) 03/29/2011  . Aloe vera Rash 03/29/2011  . Penicillins Swelling and Rash 03/29/2011    Family History  Problem Relation Age of Onset  . Heart attack Father 63  . Heart disease Mother 57  . Diabetes Mother   . Lymphoma Daughter   . Diabetes      siblings, son  . Coronary artery disease Brother   . Diabetes Brother   . Stroke Brother   . Diabetes Brother   . Diabetes Sister   . Hypertension Sister   . Diabetes Sister   . Heart disease Sister   . Hypertension Child   . Sleep apnea Child    . Hypertension Child   . Hypertension Child   . Arthritis/Rheumatoid Child   . Hyperlipidemia Child   . Hypertension Child     History   Social History  . Marital Status: Widowed    Spouse Name: N/A    Number of Children: N/A  . Years of Education: N/A   Occupational History  . Not on file.   Social History Main Topics  . Smoking status: Never Smoker   . Smokeless tobacco: Current User    Types: Snuff  . Alcohol Use: No  . Drug Use: No  . Sexual Activity: Not on file   Other Topics Concern  . Not on file   Social History Narrative   8 th grade education   Previous job: farmer    Lives with brother   Denies cigarettes, etOH, drugs. Uses snuff since age 43     Review of Systems: please see history of present illness   Physical Exam: Vital signs in last 24 hours: Temp:  [97 F (36.1 C)-98.5 F (36.9 C)] 97.7 F (36.5 C) (10/31 1143) Pulse Rate:  [86-108] 108 (10/31 1200) Resp:  [14-24] 22 (10/31 1200) BP: (90-130)/(43-72) 126/70 mmHg (10/31 1200) SpO2:  [82 %-97 %] 93 % (10/31 1200) Weight:  [93.895 kg (207 lb)-94.6 kg (208 lb 8.9 oz)] 94.6 kg (208 lb 8.9 oz) (10/30 1800) Last BM Date: 06/23/13 This is a substantially overweight Caucasian female who still looks pale even after her transfusion. She is in no evident respiratory distress, lying flat in bed on nasal cannula oxygen. Her chest is actually clear to auscultation. Heart shows an irregular rhythm, but no murmurs appreciated. Abdomen is very obese but without obvious guarding, mass effect, or tenderness. No significant lower extremity edema at this  time.    Intake/Output from previous day: 10/30 0701 - 10/31 0700 In: 765 [Blood:765] Out: 1925 [Urine:1925] Intake/Output this shift: Total I/O In: -  Out: 1200 [Urine:1200]  Lab Results:  Recent Labs  06/24/13 1825 06/25/13 0530  WBC 6.5 4.7  HGB 7.7* 9.3*  HCT 25.1* 28.4*  PLT 162 117*   BMET  Recent Labs  06/24/13 1825 06/25/13 0530   NA 140 140  K 4.1 3.8  CL 104 104  CO2 23 24  GLUCOSE 147* 117*  BUN 23 21  CREATININE 0.93 0.85  CALCIUM 9.1 9.1   LFT  Recent Labs  06/24/13 1825  PROT 6.4  ALBUMIN 3.7  AST 23  ALT 12  ALKPHOS 166*  BILITOT 0.4   PT/INR  Recent Labs  06/24/13 1825  LABPROT 15.0  INR 1.21     Studies/Results: No results found.  Impression:  1. Heme positive stool 2. Severe posthemorrhagic anemia with a clinical pattern of subacute GI bleeding 3. Multiple medical problems as outlined above  1 would suspect ulcer bleeding as a source of the patient's blood loss, but the fact that she is on daily PPI therapy would go somewhat against that diagnosis, raising the possibility of alternative etiologies such as vascular ectasia.   Plan:  I discussed the case with Dr. Rennis Golden. He would really like to have endoscopic evaluation to help guide the decision regarding future antiplatelet therapy. I feel that endoscopic evaluation, done without sedation and using the pediatric endoscope, can be accomplished with reasonable safety. I have reviewed the nature, purpose, and risks of the procedure with the patient and her son, and they are agreeable to proceed. We will arrange it for later today. Further management will depend on the endoscopic findings.    LOS: 1 day   Travis Mastel V  06/25/2013, 1:13 PM

## 2013-06-26 DIAGNOSIS — K279 Peptic ulcer, site unspecified, unspecified as acute or chronic, without hemorrhage or perforation: Secondary | ICD-10-CM | POA: Diagnosis present

## 2013-06-26 LAB — CBC
Hemoglobin: 9.7 g/dL — ABNORMAL LOW (ref 12.0–15.0)
MCH: 26.1 pg (ref 26.0–34.0)
MCV: 81.7 fL (ref 78.0–100.0)
Platelets: 133 10*3/uL — ABNORMAL LOW (ref 150–400)
RBC: 3.71 MIL/uL — ABNORMAL LOW (ref 3.87–5.11)
RDW: 15.9 % — ABNORMAL HIGH (ref 11.5–15.5)
WBC: 5.7 10*3/uL (ref 4.0–10.5)

## 2013-06-26 LAB — TYPE AND SCREEN
ABO/RH(D): O POS
Antibody Screen: NEGATIVE
Unit division: 0
Unit division: 0

## 2013-06-26 LAB — BASIC METABOLIC PANEL
CO2: 25 mEq/L (ref 19–32)
Calcium: 9 mg/dL (ref 8.4–10.5)
Chloride: 106 mEq/L (ref 96–112)
Creatinine, Ser: 1.03 mg/dL (ref 0.50–1.10)
GFR calc Af Amer: 60 mL/min — ABNORMAL LOW (ref >90)
Glucose, Bld: 73 mg/dL (ref 70–99)

## 2013-06-26 MED ORDER — PANTOPRAZOLE SODIUM 40 MG PO TBEC: 40.0000 mg | DELAYED_RELEASE_TABLET | Freq: Two times a day (BID) | ORAL | Status: DC

## 2013-06-26 NOTE — Discharge Summary (Signed)
Patient ID: [name],  MRN: [id number], DOB/AGE: 1935/08/22 78 y.o.  Admit date: 06/24/2013 Discharge date: 06/26/2013  Primary Care Provider:  Primary Cardiologist: Dr Ervin Knack  Discharge Diagnoses Principal Problem:   Acute GI bleeding- 06/24/13 Active Problems:   Pulmonary hypertension, severe; Severe by Echo 02/1713   Acute blood loss anemia- transfused 10/14   Chronic respiratory failure with hypoxia   DM2 (diabetes mellitus, type 2)   Obesity, morbid (more than 100 lbs over ideal weight or BMI > 40)   Hypotension   PUD (peptic ulcer disease)- ASA stopped   Diastolic CHF, chronic- EF 65-70% May 2014   Weakness    Procedures:  Endoscopy 06/25/13   Hospital Course 78 y/o, morbidly obese female with a history of right heart failure, pulmonary hypertension and obstructive sleep apnea, intolerant to CPAP. She has had multiple admissions this year for acute on chronic diastolic HF. Her last echo was on 01/12/2013, which demonstrated normal LV function, with an EF of 65-70% and grade I diastolic dysfunction. She last saw Dr Rennis Golden 05/10/13 and her wgt was 182. She presented to the office 06/24/13 the office complaining of increasing dyspnea and dizziness when she gets up. She was pale, SOB, hypotensive, and tachycardic. Her stool guiac was positive. She was admitted to stepdown, Her Hgb on admission was 7.7 (12.7 in Aug). She was transfused. We did have her on IV diuretics but feel that her heart failure if compensated at this time. She was seen in consult by Dr Matthias Hughs and underwent endoscopy 06/25/13.  This revealed marked antral nodularity but no obvious hemorrhage. It was decided that we would stop ASA as there is no clear indication for this in her case. If an antiplatelet agent is required in the future we would consider using Plavix as opposed to ASA. Dr Rennis Golden feels she can be discharge 06/26/13. He will see in follow up. We increased her PPI to BID, this could probably be cut back to  daily after 30 days.    Discharge Vitals:  Blood pressure 117/58, pulse 88, temperature 98.3 F (36.8 C), temperature source Oral, resp. rate 16, height 5\' 1"  (1.549 m), weight 208 lb 8.9 oz (94.6 kg), SpO2 97.00%.    Labs: Results for orders placed during the hospital encounter of 06/24/13 (from the past 48 hour(s))  MRSA PCR SCREENING     Status: None   Collection Time    06/24/13  6:20 PM      Result Value Range   MRSA by PCR NEGATIVE  NEGATIVE   Comment:            The GeneXpert MRSA Assay (FDA     approved for NASAL specimens     only), is one component of a     comprehensive MRSA colonization     surveillance program. It is not     intended to diagnose MRSA     infection nor to guide or     monitor treatment for     MRSA infections.  CBC     Status: Abnormal   Collection Time    06/24/13  6:25 PM      Result Value Range   WBC 6.5  4.0 - 10.5 K/uL   RBC 3.08 (*) 3.87 - 5.11 MIL/uL   Hemoglobin 7.7 (*) 12.0 - 15.0 g/dL   HCT 95.6 (*) 21.3 - 08.6 %   MCV 81.5  78.0 - 100.0 fL   MCH 25.0 (*) 26.0 - 34.0 pg  MCHC 30.7  30.0 - 36.0 g/dL   RDW 16.1  09.6 - 04.5 %   Platelets 162  150 - 400 K/uL  COMPREHENSIVE METABOLIC PANEL     Status: Abnormal   Collection Time    06/24/13  6:25 PM      Result Value Range   Sodium 140  135 - 145 mEq/L   Potassium 4.1  3.5 - 5.1 mEq/L   Chloride 104  96 - 112 mEq/L   CO2 23  19 - 32 mEq/L   Glucose, Bld 147 (*) 70 - 99 mg/dL   BUN 23  6 - 23 mg/dL   Creatinine, Ser 4.09  0.50 - 1.10 mg/dL   Calcium 9.1  8.4 - 81.1 mg/dL   Total Protein 6.4  6.0 - 8.3 g/dL   Albumin 3.7  3.5 - 5.2 g/dL   AST 23  0 - 37 U/L   ALT 12  0 - 35 U/L   Alkaline Phosphatase 166 (*) 39 - 117 U/L   Total Bilirubin 0.4  0.3 - 1.2 mg/dL   GFR calc non Af Amer 58 (*) >90 mL/min   GFR calc Af Amer 67 (*) >90 mL/min   Comment: (NOTE)     The eGFR has been calculated using the CKD EPI equation.     This calculation has not been validated in all clinical  situations.     eGFR's persistently <90 mL/min signify possible Chronic Kidney     Disease.  TYPE AND SCREEN     Status: None   Collection Time    06/24/13  6:25 PM      Result Value Range   ABO/RH(D) O POS     Antibody Screen NEG     Sample Expiration 06/27/2013     Unit Number [id number]     Blood Component Type RED CELLS,LR     Unit division 00     Status of Unit ISSUED,FINAL     Transfusion Status OK TO TRANSFUSE     Crossmatch Result Compatible     Unit Number [id number]     Blood Component Type RBC LR PHER2     Unit division 00     Status of Unit ISSUED     Transfusion Status OK TO TRANSFUSE     Crossmatch Result Compatible    PRO B NATRIURETIC PEPTIDE     Status: Abnormal   Collection Time    06/24/13  6:25 PM      Result Value Range   Pro B Natriuretic peptide (BNP) 2425.0 (*) 0 - 450 pg/mL  PROTIME-INR     Status: None   Collection Time    06/24/13  6:25 PM      Result Value Range   Prothrombin Time 15.0  11.6 - 15.2 seconds   INR 1.21  0.00 - 1.49  TSH     Status: Abnormal   Collection Time    06/24/13  6:25 PM      Result Value Range   TSH 0.057 (*) 0.350 - 4.500 uIU/mL   Comment: Performed at [facility]  ABO/RH     Status: None   Collection Time    06/24/13  6:25 PM      Result Value Range   ABO/RH(D) O POS    PREPARE RBC (CROSSMATCH)     Status: None   Collection Time    06/24/13  8:00 PM      Result Value Range   Order Confirmation  ORDER PROCESSED BY BLOOD BANK    GLUCOSE, CAPILLARY     Status: Abnormal   Collection Time    06/24/13 10:14 PM      Result Value Range   Glucose-Capillary 105 (*) 70 - 99 mg/dL   Comment 1 Documented in Chart     Comment 2 Notify RN    CBC     Status: Abnormal   Collection Time    06/25/13  5:30 AM      Result Value Range   WBC 4.7  4.0 - 10.5 K/uL   RBC 3.52 (*) 3.87 - 5.11 MIL/uL   Hemoglobin 9.3 (*) 12.0 - 15.0 g/dL   Comment: REPEATED TO VERIFY   HCT 28.4 (*) 36.0 - 46.0 %   MCV 80.7  78.0  - 100.0 fL   MCH 26.4  26.0 - 34.0 pg   MCHC 32.7  30.0 - 36.0 g/dL   RDW 16.1  09.6 - 04.5 %   Platelets 117 (*) 150 - 400 K/uL   Comment: PLATELET COUNT CONFIRMED BY SMEAR     RESULT REPEATED AND VERIFIED  BASIC METABOLIC PANEL     Status: Abnormal   Collection Time    06/25/13  5:30 AM      Result Value Range   Sodium 140  135 - 145 mEq/L   Potassium 3.8  3.5 - 5.1 mEq/L   Chloride 104  96 - 112 mEq/L   CO2 24  19 - 32 mEq/L   Glucose, Bld 117 (*) 70 - 99 mg/dL   BUN 21  6 - 23 mg/dL   Creatinine, Ser 4.09  0.50 - 1.10 mg/dL   Calcium 9.1  8.4 - 81.1 mg/dL   GFR calc non Af Amer 65 (*) >90 mL/min   GFR calc Af Amer 75 (*) >90 mL/min   Comment: (NOTE)     The eGFR has been calculated using the CKD EPI equation.     This calculation has not been validated in all clinical situations.     eGFR's persistently <90 mL/min signify possible Chronic Kidney     Disease.  GLUCOSE, CAPILLARY     Status: Abnormal   Collection Time    06/25/13  8:45 AM      Result Value Range   Glucose-Capillary 100 (*) 70 - 99 mg/dL  GLUCOSE, CAPILLARY     Status: Abnormal   Collection Time    06/25/13 11:41 AM      Result Value Range   Glucose-Capillary 145 (*) 70 - 99 mg/dL  HEMOGLOBIN AND HEMATOCRIT, BLOOD     Status: Abnormal   Collection Time    06/25/13  4:50 PM      Result Value Range   Hemoglobin 10.3 (*) 12.0 - 15.0 g/dL   HCT 91.4 (*) 78.2 - 95.6 %  GLUCOSE, CAPILLARY     Status: None   Collection Time    06/25/13  4:59 PM      Result Value Range   Glucose-Capillary 96  70 - 99 mg/dL  GLUCOSE, CAPILLARY     Status: Abnormal   Collection Time    06/25/13  9:59 PM      Result Value Range   Glucose-Capillary 190 (*) 70 - 99 mg/dL  CBC     Status: Abnormal   Collection Time    06/26/13  4:42 AM      Result Value Range   WBC 5.7  4.0 - 10.5 K/uL   RBC 3.71 (*)  3.87 - 5.11 MIL/uL   Hemoglobin 9.7 (*) 12.0 - 15.0 g/dL   HCT 40.9 (*) 81.1 - 91.4 %   MCV 81.7  78.0 - 100.0 fL   MCH  26.1  26.0 - 34.0 pg   MCHC 32.0  30.0 - 36.0 g/dL   RDW 78.2 (*) 95.6 - 21.3 %   Platelets 133 (*) 150 - 400 K/uL  BASIC METABOLIC PANEL     Status: Abnormal   Collection Time    06/26/13  4:42 AM      Result Value Range   Sodium 141  135 - 145 mEq/L   Potassium 4.0  3.5 - 5.1 mEq/L   Chloride 106  96 - 112 mEq/L   CO2 25  19 - 32 mEq/L   Glucose, Bld 73  70 - 99 mg/dL   BUN 19  6 - 23 mg/dL   Creatinine, Ser 0.86  0.50 - 1.10 mg/dL   Calcium 9.0  8.4 - 57.8 mg/dL   GFR calc non Af Amer 51 (*) >90 mL/min   GFR calc Af Amer 60 (*) >90 mL/min   Comment: (NOTE)     The eGFR has been calculated using the CKD EPI equation.     This calculation has not been validated in all clinical situations.     eGFR's persistently <90 mL/min signify possible Chronic Kidney     Disease.  GLUCOSE, CAPILLARY     Status: None   Collection Time    06/26/13  7:56 AM      Result Value Range   Glucose-Capillary 93  70 - 99 mg/dL    Disposition:      Follow-up Information   Follow up with Chrystie Nose, MD. (office will call you)    Specialty:  Cardiology   Contact information:   [address] [place] [address] [phone]       Discharge Medications:    Medication List    STOP taking these medications       aspirin 81 MG EC tablet     lansoprazole 30 MG capsule  Commonly known as:  PREVACID  Replaced by:  pantoprazole 40 MG tablet      TAKE these medications       ALPRAZolam 0.5 MG tablet  Commonly known as:  XANAX  Take 0.5 mg by mouth 2 (two) times daily. May take up to 4 times daily as prescribed     bisacodyl 10 MG suppository  Commonly known as:  DULCOLAX  Place 10 mg rectally as needed for constipation.     brimonidine 0.1 % Soln  Commonly known as:  ALPHAGAN P  Place 1 drop into both eyes daily.     ezetimibe 10 MG tablet  Commonly known as:  ZETIA  Take 10 mg by mouth at bedtime.     fexofenadine 180 MG tablet  Commonly known as:  ALLEGRA   Take 180 mg by mouth every morning.     glipiZIDE 10 MG tablet  Commonly known as:  GLUCOTROL  Take 10 mg by mouth 2 (two) times daily before a meal.     levothyroxine 150 MCG tablet  Commonly known as:  SYNTHROID, LEVOTHROID  Take 150 mcg by mouth daily before breakfast.     linagliptin 5 MG Tabs tablet  Commonly known as:  TRADJENTA  Take 5 mg by mouth every morning.     oxyCODONE-acetaminophen 5-325 MG per tablet  Commonly known as:  PERCOCET/ROXICET  Take  1 tablet by mouth 2 (two) times daily. May take 1 additional dose if needed for pain     OXYGEN-HELIUM IN  Inhale 4 L into the lungs continuous.     pantoprazole 40 MG tablet  Commonly known as:  PROTONIX  Take 1 tablet (40 mg total) by mouth 2 (two) times daily.     polyethylene glycol packet  Commonly known as:  MIRALAX / GLYCOLAX  Take 17 g by mouth daily.     potassium chloride SA 20 MEQ tablet  Commonly known as:  K-DUR,KLOR-CON  Take 40 mEq by mouth daily.     pravastatin 20 MG tablet  Commonly known as:  PRAVACHOL  Take 20 mg by mouth at bedtime.     STOOL SOFTENER 100 MG capsule  Generic drug:  docusate sodium  Take 100 mg by mouth 2 (two) times daily.     torsemide 20 MG tablet  Commonly known as:  DEMADEX  Take 40 mg by mouth daily.     Travoprost (BAK Free) 0.004 % Soln ophthalmic solution  Commonly known as:  TRAVATAN  Place 1 drop into both eyes at bedtime.         Duration of Discharge Encounter: Greater than 30 minutes including physician time.  Jolene Provost PA-C 06/26/2013 8:38 AM

## 2013-06-26 NOTE — Progress Notes (Addendum)
DAILY PROGRESS NOTE  Subjective:  No events overnight. H/H remain basically stable at 9.7/30 after transfusion from 7.7/25.1.  Appreciate Dr. Buccini's assistance with EGD yesterday - no active bleeding noted, but antral "gastropathy" was appreciated.  Objective:  Temp:  [97.6 F (36.4 C)-98.6 F (37 C)] 98 F (36.7 C) (11/01 0354) Pulse Rate:  [86-108] 88 (11/01 0400) Resp:  [13-28] 16 (11/01 0400) BP: (94-165)/(46-111) 94/51 mmHg (11/01 0400) SpO2:  [90 %-97 %] 97 % (11/01 0400) Weight change:   Intake/Output from previous day: 10/31 0701 - 11/01 0700 In: 440 [P.O.:440] Out: 2475 [Urine:2475]  Intake/Output from this shift:    Medications: Current Facility-Administered Medications  Medication Dose Route Frequency Provider Last Rate Last Dose  . ALPRAZolam Prudy Feeler) tablet 0.5 mg  0.5 mg Oral QHS PRN Leeann Must, MD   0.5 mg at 06/25/13 2210  . bisacodyl (DULCOLAX) suppository 10 mg  10 mg Rectal Daily PRN Abelino Derrick, PA-C      . brimonidine (ALPHAGAN) 0.2 % ophthalmic solution 1 drop  1 drop Both Eyes TID Abelino Derrick, PA-C   1 drop at 06/25/13 0901  . docusate sodium (COLACE) capsule 100 mg  100 mg Oral BID Abelino Derrick, PA-C   100 mg at 06/25/13 2205  . ezetimibe (ZETIA) tablet 10 mg  10 mg Oral QHS Abelino Derrick, PA-C   10 mg at 06/25/13 2205  . furosemide (LASIX) injection 40 mg  40 mg Intravenous BID Abelino Derrick, PA-C   40 mg at 06/25/13 1708  . glipiZIDE (GLUCOTROL) tablet 10 mg  10 mg Oral BID AC Luke K Kilroy, PA-C   10 mg at 06/25/13 1708  . insulin aspart (novoLOG) injection 0-15 Units  0-15 Units Subcutaneous TID WC Abelino Derrick, PA-C   2 Units at 06/25/13 1311  . insulin aspart (novoLOG) injection 0-5 Units  0-5 Units Subcutaneous QHS Luke K Kilroy, PA-C      . latanoprost (XALATAN) 0.005 % ophthalmic solution 1 drop  1 drop Both Eyes QHS Abelino Derrick, PA-C   1 drop at 06/24/13 2148  . levothyroxine (SYNTHROID, LEVOTHROID) tablet 150 mcg  150 mcg Oral  QAC breakfast Abelino Derrick, PA-C   150 mcg at 06/25/13 6295  . linagliptin (TRADJENTA) tablet 5 mg  5 mg Oral q morning - 10a Luke K Kilroy, PA-C      . loratadine (CLARITIN) tablet 10 mg  10 mg Oral Daily Abelino Derrick, PA-C   10 mg at 06/25/13 0901  . oxyCODONE-acetaminophen (PERCOCET/ROXICET) 5-325 MG per tablet 1 tablet  1 tablet Oral Q6H PRN Chrystie Nose, MD   1 tablet at 06/25/13 2210  . pantoprazole (PROTONIX) EC tablet 40 mg  40 mg Oral BID Chrystie Nose, MD   40 mg at 06/25/13 1021  . polyethylene glycol (MIRALAX / GLYCOLAX) packet 17 g  17 g Oral Daily Abelino Derrick, PA-C   17 g at 06/25/13 0901  . potassium chloride SA (K-DUR,KLOR-CON) CR tablet 20 mEq  20 mEq Oral BID Abelino Derrick, PA-C   20 mEq at 06/25/13 2205  . simvastatin (ZOCOR) tablet 5 mg  5 mg Oral q1800 Abelino Derrick, PA-C   5 mg at 06/25/13 1708    Physical Exam: General appearance: alert and no distress Neck: no JVD and supple, symmetrical, trachea midline Lungs: diminished breath sounds bilaterally Heart: regular rate and rhythm, S1, S2 normal, no murmur, click, rub or gallop  Abdomen: soft, non-tender; bowel sounds normal; no masses,  no organomegaly and obese Extremities: edema trace bilateraly Pulses: 2+ and symmetric Skin: less pale than admission Neurologic: Mental status: Alert, oriented, thought content appropriate Wants to go home  Lab Results: Results for orders placed during the hospital encounter of 06/24/13 (from the past 48 hour(s))  MRSA PCR SCREENING     Status: None   Collection Time    06/24/13  6:20 PM      Result Value Range   MRSA by PCR NEGATIVE  NEGATIVE   Comment:            The GeneXpert MRSA Assay (FDA     approved for NASAL specimens     only), is one component of a     comprehensive MRSA colonization     surveillance program. It is not     intended to diagnose MRSA     infection nor to guide or     monitor treatment for     MRSA infections.  CBC     Status: Abnormal    Collection Time    06/24/13  6:25 PM      Result Value Range   WBC 6.5  4.0 - 10.5 K/uL   RBC 3.08 (*) 3.87 - 5.11 MIL/uL   Hemoglobin 7.7 (*) 12.0 - 15.0 g/dL   HCT 16.1 (*) 09.6 - 04.5 %   MCV 81.5  78.0 - 100.0 fL   MCH 25.0 (*) 26.0 - 34.0 pg   MCHC 30.7  30.0 - 36.0 g/dL   RDW 40.9  81.1 - 91.4 %   Platelets 162  150 - 400 K/uL  COMPREHENSIVE METABOLIC PANEL     Status: Abnormal   Collection Time    06/24/13  6:25 PM      Result Value Range   Sodium 140  135 - 145 mEq/L   Potassium 4.1  3.5 - 5.1 mEq/L   Chloride 104  96 - 112 mEq/L   CO2 23  19 - 32 mEq/L   Glucose, Bld 147 (*) 70 - 99 mg/dL   BUN 23  6 - 23 mg/dL   Creatinine, Ser 7.82  0.50 - 1.10 mg/dL   Calcium 9.1  8.4 - 95.6 mg/dL   Total Protein 6.4  6.0 - 8.3 g/dL   Albumin 3.7  3.5 - 5.2 g/dL   AST 23  0 - 37 U/L   ALT 12  0 - 35 U/L   Alkaline Phosphatase 166 (*) 39 - 117 U/L   Total Bilirubin 0.4  0.3 - 1.2 mg/dL   GFR calc non Af Amer 58 (*) >90 mL/min   GFR calc Af Amer 67 (*) >90 mL/min   Comment: (NOTE)     The eGFR has been calculated using the CKD EPI equation.     This calculation has not been validated in all clinical situations.     eGFR's persistently <90 mL/min signify possible Chronic Kidney     Disease.  TYPE AND SCREEN     Status: None   Collection Time    06/24/13  6:25 PM      Result Value Range   ABO/RH(D) O POS     Antibody Screen NEG     Sample Expiration 06/27/2013     Unit Number [id number]     Blood Component Type RED CELLS,LR     Unit division 00     Status of Unit ISSUED,FINAL     Transfusion Status  OK TO TRANSFUSE     Crossmatch Result Compatible     Unit Number [id number]     Blood Component Type RBC LR PHER2     Unit division 00     Status of Unit ISSUED     Transfusion Status OK TO TRANSFUSE     Crossmatch Result Compatible    PRO B NATRIURETIC PEPTIDE     Status: Abnormal   Collection Time    06/24/13  6:25 PM      Result Value Range   Pro B Natriuretic  peptide (BNP) 2425.0 (*) 0 - 450 pg/mL  PROTIME-INR     Status: None   Collection Time    06/24/13  6:25 PM      Result Value Range   Prothrombin Time 15.0  11.6 - 15.2 seconds   INR 1.21  0.00 - 1.49  TSH     Status: Abnormal   Collection Time    06/24/13  6:25 PM      Result Value Range   TSH 0.057 (*) 0.350 - 4.500 uIU/mL   Comment: Performed at [facility]  ABO/RH     Status: None   Collection Time    06/24/13  6:25 PM      Result Value Range   ABO/RH(D) O POS    PREPARE RBC (CROSSMATCH)     Status: None   Collection Time    06/24/13  8:00 PM      Result Value Range   Order Confirmation ORDER PROCESSED BY BLOOD BANK    GLUCOSE, CAPILLARY     Status: Abnormal   Collection Time    06/24/13 10:14 PM      Result Value Range   Glucose-Capillary 105 (*) 70 - 99 mg/dL   Comment 1 Documented in Chart     Comment 2 Notify RN    CBC     Status: Abnormal   Collection Time    06/25/13  5:30 AM      Result Value Range   WBC 4.7  4.0 - 10.5 K/uL   RBC 3.52 (*) 3.87 - 5.11 MIL/uL   Hemoglobin 9.3 (*) 12.0 - 15.0 g/dL   Comment: REPEATED TO VERIFY   HCT 28.4 (*) 36.0 - 46.0 %   MCV 80.7  78.0 - 100.0 fL   MCH 26.4  26.0 - 34.0 pg   MCHC 32.7  30.0 - 36.0 g/dL   RDW 11.9  14.7 - 82.9 %   Platelets 117 (*) 150 - 400 K/uL   Comment: PLATELET COUNT CONFIRMED BY SMEAR     RESULT REPEATED AND VERIFIED  BASIC METABOLIC PANEL     Status: Abnormal   Collection Time    06/25/13  5:30 AM      Result Value Range   Sodium 140  135 - 145 mEq/L   Potassium 3.8  3.5 - 5.1 mEq/L   Chloride 104  96 - 112 mEq/L   CO2 24  19 - 32 mEq/L   Glucose, Bld 117 (*) 70 - 99 mg/dL   BUN 21  6 - 23 mg/dL   Creatinine, Ser 5.62  0.50 - 1.10 mg/dL   Calcium 9.1  8.4 - 13.0 mg/dL   GFR calc non Af Amer 65 (*) >90 mL/min   GFR calc Af Amer 75 (*) >90 mL/min   Comment: (NOTE)     The eGFR has been calculated using the CKD EPI equation.     This calculation has not  been validated in all  clinical situations.     eGFR's persistently <90 mL/min signify possible Chronic Kidney     Disease.  GLUCOSE, CAPILLARY     Status: Abnormal   Collection Time    06/25/13  8:45 AM      Result Value Range   Glucose-Capillary 100 (*) 70 - 99 mg/dL  GLUCOSE, CAPILLARY     Status: Abnormal   Collection Time    06/25/13 11:41 AM      Result Value Range   Glucose-Capillary 145 (*) 70 - 99 mg/dL  HEMOGLOBIN AND HEMATOCRIT, BLOOD     Status: Abnormal   Collection Time    06/25/13  4:50 PM      Result Value Range   Hemoglobin 10.3 (*) 12.0 - 15.0 g/dL   HCT 96.0 (*) 45.4 - 09.8 %  GLUCOSE, CAPILLARY     Status: None   Collection Time    06/25/13  4:59 PM      Result Value Range   Glucose-Capillary 96  70 - 99 mg/dL  GLUCOSE, CAPILLARY     Status: Abnormal   Collection Time    06/25/13  9:59 PM      Result Value Range   Glucose-Capillary 190 (*) 70 - 99 mg/dL  CBC     Status: Abnormal   Collection Time    06/26/13  4:42 AM      Result Value Range   WBC 5.7  4.0 - 10.5 K/uL   RBC 3.71 (*) 3.87 - 5.11 MIL/uL   Hemoglobin 9.7 (*) 12.0 - 15.0 g/dL   HCT 11.9 (*) 14.7 - 82.9 %   MCV 81.7  78.0 - 100.0 fL   MCH 26.1  26.0 - 34.0 pg   MCHC 32.0  30.0 - 36.0 g/dL   RDW 56.2 (*) 13.0 - 86.5 %   Platelets 133 (*) 150 - 400 K/uL  BASIC METABOLIC PANEL     Status: Abnormal   Collection Time    06/26/13  4:42 AM      Result Value Range   Sodium 141  135 - 145 mEq/L   Potassium 4.0  3.5 - 5.1 mEq/L   Chloride 106  96 - 112 mEq/L   CO2 25  19 - 32 mEq/L   Glucose, Bld 73  70 - 99 mg/dL   BUN 19  6 - 23 mg/dL   Creatinine, Ser 7.84  0.50 - 1.10 mg/dL   Calcium 9.0  8.4 - 69.6 mg/dL   GFR calc non Af Amer 51 (*) >90 mL/min   GFR calc Af Amer 60 (*) >90 mL/min   Comment: (NOTE)     The eGFR has been calculated using the CKD EPI equation.     This calculation has not been validated in all clinical situations.     eGFR's persistently <90 mL/min signify possible Chronic Kidney      Disease.    Imaging: No results found.  Assessment:  1. Principal Problem: 2.   Acute GI bleeding 3. Active Problems: 4.   Chronic respiratory failure with hypoxia 5.   DM2 (diabetes mellitus, type 2) 6.   Obesity, morbid (more than 100 lbs over ideal weight or BMI > 40) 7.   Pulmonary hypertension, severe; Severe by Echo 02/1713 8.   Hypotension 9.   Diastolic CHF, chronic- EF 65-70% May 2014 10.   Weakness 11.   Acute blood loss anemia 12.   Plan:  1. Mrs. Kopper has probably  had subacute slow GI bleeding from the UGI tract, possibly exacerbated by aspirin use. This was cautioned against in the past by Dr. Benard Rink.  She had normal coronaries by cath in 2011 - other than diabetes, there is no prior history of stroke.  I think the risk/benefit ratio of aspirin is not in favor of continuing it.  If she should need antiplatelet medications in the future, I would recommend plavix. Continue BID PPI. Resume home torsemide and potassium doses. Ok for discharge home today.  Follow-up with me in 7-10 days. Re-check outpatient CBC and iron studies.  Time Spent Directly with Patient:  15 minutes  Length of Stay:  LOS: 2 days   Chrystie Nose, MD, [facility] Attending Cardiologist [facility]  HILTY,Kenneth C 06/26/2013, 7:43 AM

## 2013-06-28 ENCOUNTER — Encounter (HOSPITAL_COMMUNITY): Payer: Self-pay | Admitting: Gastroenterology

## 2013-07-08 ENCOUNTER — Ambulatory Visit (INDEPENDENT_AMBULATORY_CARE_PROVIDER_SITE_OTHER): Payer: Medicare Other | Admitting: Internal Medicine

## 2013-07-08 VITALS — BP 112/68 | HR 112 | Ht 61.0 in | Wt 211.9 lb

## 2013-07-08 DIAGNOSIS — E119 Type 2 diabetes mellitus without complications: Secondary | ICD-10-CM

## 2013-07-08 DIAGNOSIS — R5381 Other malaise: Secondary | ICD-10-CM

## 2013-07-08 DIAGNOSIS — I272 Pulmonary hypertension, unspecified: Secondary | ICD-10-CM

## 2013-07-08 DIAGNOSIS — I2729 Other secondary pulmonary hypertension: Secondary | ICD-10-CM

## 2013-07-08 DIAGNOSIS — I509 Heart failure, unspecified: Secondary | ICD-10-CM

## 2013-07-08 DIAGNOSIS — R531 Weakness: Secondary | ICD-10-CM

## 2013-07-08 DIAGNOSIS — J9610 Chronic respiratory failure, unspecified whether with hypoxia or hypercapnia: Secondary | ICD-10-CM

## 2013-07-08 DIAGNOSIS — I50810 Right heart failure, unspecified: Secondary | ICD-10-CM

## 2013-07-08 DIAGNOSIS — R0902 Hypoxemia: Secondary | ICD-10-CM

## 2013-07-08 DIAGNOSIS — D649 Anemia, unspecified: Secondary | ICD-10-CM

## 2013-07-08 DIAGNOSIS — I5032 Chronic diastolic (congestive) heart failure: Secondary | ICD-10-CM

## 2013-07-08 DIAGNOSIS — J9611 Chronic respiratory failure with hypoxia: Secondary | ICD-10-CM

## 2013-07-08 DIAGNOSIS — I2789 Other specified pulmonary heart diseases: Secondary | ICD-10-CM

## 2013-07-08 LAB — IRON AND TIBC
%SAT: 13 % — ABNORMAL LOW (ref 20–55)
Iron: 69 ug/dL (ref 42–145)
UIBC: 462 ug/dL — ABNORMAL HIGH (ref 125–400)

## 2013-07-08 NOTE — Patient Instructions (Signed)
Please have blood work done today.  Your physician recommends that you schedule a follow-up appointment in 3 months.

## 2013-07-09 ENCOUNTER — Telehealth: Payer: Self-pay | Admitting: *Deleted

## 2013-07-09 LAB — FERRITIN: Ferritin: 9 ng/mL — ABNORMAL LOW (ref 10–291)

## 2013-07-09 LAB — CBC
HCT: 33.6 % — ABNORMAL LOW (ref 36.0–46.0)
Hemoglobin: 10.3 g/dL — ABNORMAL LOW (ref 12.0–15.0)
MCH: 24.5 pg — ABNORMAL LOW (ref 26.0–34.0)
MCHC: 30.7 g/dL (ref 30.0–36.0)
MCV: 79.8 fL (ref 78.0–100.0)
RBC: 4.21 MIL/uL (ref 3.87–5.11)

## 2013-07-09 LAB — TRANSFERRIN: Transferrin: 413 mg/dL — ABNORMAL HIGH (ref 200–360)

## 2013-07-09 MED ORDER — POLYSACCHARIDE IRON COMPLEX 150 MG PO CAPS: 150.0000 mg | ORAL_CAPSULE | Freq: Every day | ORAL | Status: DC

## 2013-07-09 NOTE — Telephone Encounter (Signed)
Called patient with lab results and informed that Dr. Rennis Golden has ordered an iron Rx. Informed this would be ordered and sent to pharmacy and to stop OTC iron supplement. Patient agreed with plan & verbalized understanding.

## 2013-07-09 NOTE — Telephone Encounter (Signed)
Message copied by Lindell Spar on Fri Jul 09, 2013 12:11 PM ------      Message from: Chrystie Nose      Created: Fri Jul 09, 2013 11:51 AM       Please let her know her anemia has improved.  I would recommend that she takes Nu-Iron 150 mg daily (needs Rx for this). She can stop her over the counter iron supplements.             Dr. Rennis Golden ------

## 2013-07-14 ENCOUNTER — Encounter: Payer: Self-pay | Admitting: Internal Medicine

## 2013-07-14 NOTE — Progress Notes (Signed)
07/14/2013   PCP: Cassell Smiles., MD   Chief Complaint  Patient presents with  . Hospitalization Follow-up    C/o shortness of breath on exertion - on 4L oxygen, pain in knees&feet.    Primary Cardiologist: Dr. Rennis Golden  HPI:  78 y/o, morbidly obese female with a history of right heart failure, pulmonary hypertension and obstructive sleep apnea, intolerant to CPAP. She has had multiple admissions this year for acute on chronic diastolic HF. Her last echo was on 01/12/2013, which demonstrated normal LV function, with an EF of 65-70% and grade I diastolic dysfunction. She presented back to [facility] July 15 with a complaint of progressive resting dyspnea and bilateral lower extremity edema. She noted associated weight gain, orthopnea and PND. She denied any chest pain. She was diuresed, and improved. Dry weight was thought to be 215 pounds.   She was diuresed 11 liters in the hospital. At discharge he was to be on Bumex 3 mg daily and Zaroxolyn 5 mg every morning the Cozaar was held secondary to hypotension. Also she continued with home oxygen.  Unfortunately Bumex was not being made so she was placed on Demadex 60 mg twice a day.Since discharge she has done quite well, her weight dropped quite dramatically to 199 pounds.  She feels much better not we can all objects and feels like her energy has increased somewhat. At her home her weight has been 196.  She denies shortness of breath or chest pain and she does wear oxygen.  Repeat laboratory work was performed and it demonstrated probable dehydration or over diuresis. After being seen by Nada Boozer, one of our nurse practitioners, she recommended decreasing her diuretics which ever occurred. Unfortunately she re\re presented to the hospital in dehydration. Her medications were adjusted and she was hypotensive, therefore her blood pressure medications were stopped. Today she returns from that hospital followup and is on a lower dose of diuretics.  Again her blood pressure medicines are stopped, and I suspect she was on higher doses of blood pressure medicines due to the very high-volume and her pulmonary hypertension. She reports doing fairly well with a stable weight at home.   Ms. Mcdowell was recently admitted for acute GI bleeding. Her aspirin and and Plavix were discontinued. I actually do not see a clear indication for either medicine they suspected she had normal coronaries. She did undergo endoscopy by Dr. Matthias Hughs, which did not show a clear source of her bleeding.  She appears to be clinically compensated with regard to her right heart failure.  I've reviewed daily weights performed by her home health nurse [facility]. This demonstrates her weight has been very stable within 1-2 pounds over the past week. Of note she has started taking low-dose iron over-the-counter.  Allergies  Allergen Reactions  . Aspirin Other (See Comments)    GI bleed/ PUD  . Darvocet [Propoxyphene-Acetaminophen] Nausea And Vomiting  . Diphenhydramine Hcl Other (See Comments)    Glaucoma prevents patient from taking this medication   . Aloe Vera Rash  . Penicillins Swelling and Rash    Current Outpatient Prescriptions  Medication Sig Dispense Refill  . ALPRAZolam (XANAX) 0.5 MG tablet Take 0.5 mg by mouth 2 (two) times daily. May take up to 4 times daily as prescribed      . bisacodyl (DULCOLAX) 10 MG suppository Place 10 mg rectally as needed for constipation.      . brimonidine (ALPHAGAN P) 0.1 % SOLN Place  1 drop into both eyes daily.      Marland Kitchen docusate sodium (STOOL SOFTENER) 100 MG capsule Take 100 mg by mouth 2 (two) times daily.      Marland Kitchen ezetimibe (ZETIA) 10 MG tablet Take 10 mg by mouth at bedtime.        . fexofenadine (ALLEGRA) 180 MG tablet Take 180 mg by mouth every morning.      Marland Kitchen glipiZIDE (GLUCOTROL) 10 MG tablet Take 10 mg by mouth 2 (two) times daily before a meal.        . levothyroxine (SYNTHROID, LEVOTHROID) 150 MCG  tablet Take 150 mcg by mouth daily before breakfast.       . linagliptin (TRADJENTA) 5 MG TABS tablet Take 5 mg by mouth every morning.       Marland Kitchen oxyCODONE-acetaminophen (PERCOCET/ROXICET) 5-325 MG per tablet Take 1 tablet by mouth 2 (two) times daily. May take 1 additional dose if needed for pain      . OXYGEN-HELIUM IN Inhale 4 L into the lungs continuous.      . pantoprazole (PROTONIX) 40 MG tablet Take 1 tablet (40 mg total) by mouth 2 (two) times daily.  60 tablet  5  . polyethylene glycol (MIRALAX / GLYCOLAX) packet Take 17 g by mouth daily.      . potassium chloride SA (K-DUR,KLOR-CON) 20 MEQ tablet Take 40 mEq by mouth daily.      . pravastatin (PRAVACHOL) 20 MG tablet Take 20 mg by mouth at bedtime.       . torsemide (DEMADEX) 20 MG tablet Take 40 mg by mouth daily.      . Travoprost, BAK Free, (TRAVATAN) 0.004 % SOLN ophthalmic solution Place 1 drop into both eyes at bedtime.      . iron polysaccharides (NIFEREX) 150 MG capsule Take 1 capsule (150 mg total) by mouth daily.  90 capsule  3   No current facility-administered medications for this visit.    Past Medical History  Diagnosis Date  . Diabetes mellitus   . Hypertension   . Shingles   . GERD (gastroesophageal reflux disease)   . Abnormal heart rhythms   . Thyroid disease     hypthyroidism  . Pulmonary hypertension, moderate to severe     No evidence of CAD on Cath 2011  . Obesity, morbid (more than 100 lbs over ideal weight or BMI > 40)   . Chronic respiratory failure with hypoxia 01/23/2012    Related to OHS  . Glaucoma   . CHF (congestive heart failure)     Echo at [facility] 10/14/2012 see notes  . Acute on chronic renal failure     Past Surgical History  Procedure Laterality Date  . Tubal ligation    . Ovary removed    . Knee arthroscopy      left  . Cardiovascular stress test  09/15/2006    EF 81%; normal perfusion all regions; LV normal in size; no scintigraphic evidence of inducible myocardial ischemia  . Cardiac  catheterization  02/21/2010    EF 65%; PA pressure 86/21 with mean of 44; normal coronary arteries, primary pumonary hypertension; no significant wall motion abnormalities  . Esophagogastroduodenoscopy N/A 06/25/2013    Procedure: ESOPHAGOGASTRODUODENOSCOPY (EGD);  Surgeon: Florencia Reasons, MD;  Location: [facility] ENDOSCOPY;  Service: Endoscopy;  Laterality: N/A;  pediatric upper endoscope; no sedation    ZOX:[id number] colds or fevers, significant weight changes Skin:no rashes or ulcers HEENT:no blurred vision, no congestion CV:see HPI PUL:see HPI GI:no diarrhea  constipation or melena, no indigestion GU:no hematuria, no dysuria MS:no joint pain, no claudication Neuro:no syncope, no lightheadedness Endo:+ diabetes, + thyroid disease  PHYSICAL EXAM BP 112/68  Pulse 112  Ht 5\' 1"  (1.549 m)  Wt 211 lb 14.4 oz (96.117 kg)  BMI 40.06 kg/m2 GEN: Awake, on oxygen, in no distress HEENT: PERRLA, EOMI, dry mucous membranes Neck: No jugular venous distention Heart: Regular rate and rhythm, normal S1-S2, no murmurs rubs or gallops Lungs: Decreased breath sounds bilaterally, no rales rhonchi or wheezes Abdomen: Grossly obese, soft, nontender, no fluid wave Extremities: Trace to mild bilateral pedal edema Pulses: 1+ bilaterally Neurologic: Grossly nonfocal Skin: Improved skin color Psych: Mildly anxious  EKG:  deferred  ASSESSMENT AND PLAN Patient Active Problem List   Diagnosis Date Noted  . PUD (peptic ulcer disease)- ASA stopped 06/26/2013  . Acute blood loss anemia- transfused 10/14 06/25/2013  . Acute GI bleeding- 06/24/13 06/24/2013  . Hypokalemia 04/05/2013  . Hyponatremia 04/05/2013  . Thrombocytopenia, unspecified 04/05/2013  . Volume depletion 04/04/2013  . Weakness 04/04/2013  . Acute renal failure 04/04/2013  . Hypotension 12/25/2012  . Diastolic CHF, chronic- EF 65-70% May 2014 12/25/2012  . Unspecified hypothyroidism 10/14/2012  . Normal coronary arteries at cath 2011  10/14/2012  . Obesity, morbid (more than 100 lbs over ideal weight or BMI > 40)   . Pulmonary hypertension, severe; Severe by Echo 02/1713   . 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 DM2 (diabetes mellitus, type 2) 10/13/2012  . Chronic respiratory failure with hypoxia 01/23/2012   Plan: 1.  Mrs. Lipuma appears stable on her current dose of diuretic. She continues to have stable breathlessness on continuous 4 L oxygen given her history of pulmonary hypertension There are no signs of ongoing GI bleeding. I would like to go ahead and recheck labs today demonstrate a stable hemoglobin and hematocrit as well as to check her iron stores. She will likely need to be on a stronger dose iron.  Given her multiple comorbidities I would like to followup closely with her and plan to see her back in 3 months.   Chrystie Nose, MD, [facility] Attending Cardiologist [facility]

## 2013-07-19 ENCOUNTER — Ambulatory Visit: Payer: Medicare Other | Admitting: Internal Medicine

## 2013-07-20 ENCOUNTER — Telehealth: Payer: Self-pay | Admitting: Internal Medicine

## 2013-07-20 ENCOUNTER — Telehealth: Payer: Self-pay | Admitting: *Deleted

## 2013-07-20 DIAGNOSIS — Z79899 Other long term (current) drug therapy: Secondary | ICD-10-CM

## 2013-07-20 LAB — CBC
Hemoglobin: 9.9 g/dL — ABNORMAL LOW (ref 12.0–15.0)
MCH: 23.4 pg — ABNORMAL LOW (ref 26.0–34.0)
MCHC: 30.1 g/dL (ref 30.0–36.0)
Platelets: 169 10*3/uL (ref 150–400)
RBC: 4.23 MIL/uL (ref 3.87–5.11)
RDW: 17.6 % — ABNORMAL HIGH (ref 11.5–15.5)

## 2013-07-20 NOTE — Telephone Encounter (Signed)
Message copied by Chauncey Reading on Tue Jul 20, 2013  3:40 PM ------      Message from: Chrystie Nose      Created: Tue Jul 20, 2013  3:06 PM       Timoney's CBC looks unchanged.  Many other reasons she could be short of breath. Has she had weight gain?             Dr. Rennis Golden ------

## 2013-07-20 NOTE — Telephone Encounter (Signed)
Call to [facility] w/ [facility] and left message w/ result and that Dr. Rennis Golden wants to know if pt has had wt gain.

## 2013-07-20 NOTE — Telephone Encounter (Signed)
Alisa called back.  Stated pt's weight was 208 lbs, which is down from last OV.  Stated pt's weight has been running 207 lbs to 208 lbs on her calendar.  Stated does have pt down to call to check on her in the morning and will call back w/ an update.  Stated pt is not in any distress, but "is not herself" and is "less comfortable" than usual.    Message forwarded to Dr. Dorene Grebe, RN.

## 2013-07-20 NOTE — Telephone Encounter (Signed)
Would have her get another CBC drawn - she had recent GI bleed. If she is fatigued and dyspneic and pale, she may be bleeding again.    Dr. Rennis Golden

## 2013-07-20 NOTE — Addendum Note (Signed)
Addended by: Beecher Mcardle R on: 07/20/2013 12:08 PM   Modules accepted: Orders

## 2013-07-20 NOTE — Telephone Encounter (Signed)
Call to [facility] and VO given for CBC.  Verbalized understanding and asked that order be faxed to [phone] and she will take to [facility] to be resulted to Dr. Rennis Golden.   Lab order printed and faxed.

## 2013-07-20 NOTE — Telephone Encounter (Signed)
Alisa w/ [facility] called.  Stated pt called her this morning w c/o increased SOB.  Stated when she arrived to assess pt today, pt appears comfortable at rest on O2 at 4L.  Stated when pt stands, she becomes dyspneic and feels like she is going to pass out.  Stated pt reported this going on for about a week now.  Alisa stated pt is pale, appears fatigued, has 1+ LE edema, lungs clear and O2 sats 97% on 4L.  Stated pt w/o fever or cough.  Also stated pt had some med changes at last hospital discharge and doesn't know if pt needs meds to be evaluated again.    Alisa informed RN will discuss w/ provider as Dr. Rennis Golden is out of the office today and call her back.  Verbalized understanding and agreed w/ plan.  [name], PA-C notified and reviewed chart.  Asked that [facility] nurse check vitals.  Alisa called back and informed.  Stated pt's BP was 120/80 HR 100 (sitting), BP 120/82 HR 102 (standing), BG 134 (normal for pt per Alisa) and afebrile.  Luke, PA-C notified and advised no change in orders.  If pt feels she needs to be seen, then bring her in.  Pt should continue current med regimen.  Alisa informed and verbalized understanding.  Stated pt did not want to come in.  Stated she will check on pt later this afternoon and tomorrow morning since pt is on her route.  Stated she will send quick notes to office to update as she assesses pt to this RN's attention (fax at nurse station).    Alisa informed she can send updates and to make them to the attention of Jenna, Dr. Blanchie Dessert nurse, as this RN will be out of the office tomorrow.  Also informed Dr. Rennis Golden will be notified.  Verbalized understanding.  Message forwarded to Dr. Rennis Golden.

## 2013-08-05 ENCOUNTER — Other Ambulatory Visit (HOSPITAL_COMMUNITY): Payer: Self-pay | Admitting: Family Medicine

## 2013-08-05 DIAGNOSIS — Z139 Encounter for screening, unspecified: Secondary | ICD-10-CM

## 2013-08-05 DIAGNOSIS — M19019 Primary osteoarthritis, unspecified shoulder: Secondary | ICD-10-CM

## 2013-08-07 ENCOUNTER — Other Ambulatory Visit: Payer: Self-pay | Admitting: Internal Medicine

## 2013-08-09 ENCOUNTER — Ambulatory Visit: Payer: Medicare Other | Admitting: Cardiology

## 2013-08-09 ENCOUNTER — Other Ambulatory Visit (HOSPITAL_COMMUNITY): Payer: Self-pay | Admitting: Family Medicine

## 2013-08-09 ENCOUNTER — Encounter: Payer: Self-pay | Admitting: Cardiology

## 2013-08-09 ENCOUNTER — Ambulatory Visit (INDEPENDENT_AMBULATORY_CARE_PROVIDER_SITE_OTHER): Payer: Medicare Other | Admitting: Cardiology

## 2013-08-09 VITALS — BP 134/68 | HR 88 | Ht 61.0 in | Wt 232.0 lb

## 2013-08-09 DIAGNOSIS — I272 Pulmonary hypertension, unspecified: Secondary | ICD-10-CM

## 2013-08-09 DIAGNOSIS — R0602 Shortness of breath: Secondary | ICD-10-CM

## 2013-08-09 DIAGNOSIS — I5031 Acute diastolic (congestive) heart failure: Secondary | ICD-10-CM

## 2013-08-09 DIAGNOSIS — I2789 Other specified pulmonary heart diseases: Secondary | ICD-10-CM

## 2013-08-09 DIAGNOSIS — I288 Other diseases of pulmonary vessels: Secondary | ICD-10-CM

## 2013-08-09 DIAGNOSIS — D649 Anemia, unspecified: Secondary | ICD-10-CM

## 2013-08-09 DIAGNOSIS — J9610 Chronic respiratory failure, unspecified whether with hypoxia or hypercapnia: Secondary | ICD-10-CM

## 2013-08-09 DIAGNOSIS — J9611 Chronic respiratory failure with hypoxia: Secondary | ICD-10-CM

## 2013-08-09 DIAGNOSIS — E119 Type 2 diabetes mellitus without complications: Secondary | ICD-10-CM

## 2013-08-09 DIAGNOSIS — J449 Chronic obstructive pulmonary disease, unspecified: Secondary | ICD-10-CM

## 2013-08-09 DIAGNOSIS — R0902 Hypoxemia: Secondary | ICD-10-CM

## 2013-08-09 MED ORDER — TORSEMIDE 20 MG PO TABS: 40.0000 mg | ORAL_TABLET | Freq: Two times a day (BID) | ORAL | Status: DC

## 2013-08-09 MED ORDER — METOLAZONE 5 MG PO TABS: 5.0000 mg | ORAL_TABLET | Freq: Every day | ORAL | Status: DC

## 2013-08-09 NOTE — Patient Instructions (Signed)
Increase Torsemide to 40 mg twice a day.  Add metolazone 5 mg prior to AM torsemide.  Have home health draw blood on Wed. AM  BMP, Pro BNP and CBC  Visit with PA on Thursday this week.  If no better we will need to admit you to the hospital.

## 2013-08-09 NOTE — Progress Notes (Signed)
08/09/2013   PCP: Cassell Smiles., MD   Chief Complaint  Patient presents with  . Follow-up    too much fluid, edema bilat LE and abd, and increased sob especially with exertion    Primary Cardiologist: Dr. Rennis Golden  HPI: 78 y/o, morbidly obese female with a history of right heart failure, pulmonary hypertension and obstructive sleep apnea, intolerant to CPAP.  Wears oxygen 24/7 She has had multiple admissions this year for acute on chronic diastolic HF. Her last echo was on 01/12/2013, which demonstrated normal LV function, with an EF of 65-70% and grade I diastolic dysfunction. She last saw Dr Rennis Golden 05/10/13 and her wgt was 182. Wt in [place] was 207, Nov 211 and now 232.    She called with complaints of SOB mostly with exertion.  She cannot go from bed to BR without feeling poorly, and SOB.  Increasing weight and increasing edema.  She has had problems in recent past with anemic and acute bleed now Hgb stable at 9.9 but will recheck.  Hx of acute renal failure that has resolved, most recent Cr. 1.03.  Some concern for home treatment, she has failed to follow instructions in the past but we will try increasing diuretics and see back in 3 days.   EF by cath in July 2014 60-65% PA pk PRESSURE 74 mmHg, severe pulmonary HTN.  Allergies  Allergen Reactions  . Aspirin Other (See Comments)    GI bleed/ PUD  . Darvocet [Propoxyphene-Acetaminophen] Nausea And Vomiting  . Diphenhydramine Hcl Other (See Comments)    Glaucoma prevents patient from taking this medication   . Aloe Vera Rash  . Penicillins Swelling and Rash    Current Outpatient Prescriptions  Medication Sig Dispense Refill  . ALPRAZolam (XANAX) 0.5 MG tablet Take 0.5 mg by mouth 2 (two) times daily. May take up to 4 times daily as prescribed      . bisacodyl (DULCOLAX) 10 MG suppository Place 10 mg rectally as needed for constipation.      . brimonidine (ALPHAGAN P) 0.1 % SOLN Place 1 drop into both eyes daily.       Marland Kitchen docusate sodium (STOOL SOFTENER) 100 MG capsule Take 100 mg by mouth 2 (two) times daily.      Marland Kitchen ezetimibe (ZETIA) 10 MG tablet Take 10 mg by mouth at bedtime.        . fexofenadine (ALLEGRA) 180 MG tablet Take 180 mg by mouth every morning.      Marland Kitchen glipiZIDE (GLUCOTROL) 10 MG tablet Take 10 mg by mouth 2 (two) times daily before a meal.        . iron polysaccharides (NIFEREX) 150 MG capsule Take 1 capsule (150 mg total) by mouth daily.  90 capsule  3  . levothyroxine (SYNTHROID, LEVOTHROID) 150 MCG tablet Take 150 mcg by mouth daily before breakfast.       . linagliptin (TRADJENTA) 5 MG TABS tablet Take 5 mg by mouth every morning.       Marland Kitchen oxyCODONE-acetaminophen (PERCOCET/ROXICET) 5-325 MG per tablet Take 1 tablet by mouth 2 (two) times daily. May take 1 additional dose if needed for pain      . OXYGEN-HELIUM IN Inhale 4 L into the lungs continuous.      . pantoprazole (PROTONIX) 40 MG tablet Take 1 tablet (40 mg total) by mouth 2 (two) times daily.  60 tablet  5  . polyethylene glycol (MIRALAX / GLYCOLAX) packet Take 17  g by mouth daily.      . potassium chloride SA (K-DUR,KLOR-CON) 20 MEQ tablet Take 40 mEq by mouth daily.      . pravastatin (PRAVACHOL) 20 MG tablet TAKE ONE TABLET DAILY AT BEDTIME  30 tablet  11  . sulfamethoxazole-trimethoprim (BACTRIM DS,SEPTRA DS) 800-160 MG per tablet Take 1 tablet by mouth 2 (two) times daily.      Marland Kitchen torsemide (DEMADEX) 20 MG tablet Take 2 tablets (40 mg total) by mouth 2 (two) times daily.  120 tablet  6  . Travoprost, BAK Free, (TRAVATAN) 0.004 % SOLN ophthalmic solution Place 1 drop into both eyes at bedtime.      . metolazone (ZAROXOLYN) 5 MG tablet Take 1 tablet (5 mg total) by mouth daily. Take one daily before torsemide.  30 tablet  0   No current facility-administered medications for this visit.    Past Medical History  Diagnosis Date  . Diabetes mellitus   . Hypertension   . Shingles   . GERD (gastroesophageal reflux disease)   .  Abnormal heart rhythms   . Thyroid disease     hypthyroidism  . Pulmonary hypertension, moderate to severe     No evidence of CAD on Cath 2011  . Obesity, morbid (more than 100 lbs over ideal weight or BMI > 40)   . Chronic respiratory failure with hypoxia 01/23/2012    Related to OHS  . Glaucoma   . CHF (congestive heart failure)     Echo at [facility] 10/14/2012 see notes  . Acute on chronic renal failure     Past Surgical History  Procedure Laterality Date  . Tubal ligation    . Ovary removed    . Knee arthroscopy      left  . Cardiovascular stress test  09/15/2006    EF 81%; normal perfusion all regions; LV normal in size; no scintigraphic evidence of inducible myocardial ischemia  . Cardiac catheterization  02/21/2010    EF 65%; PA pressure 86/21 with mean of 44; normal coronary arteries, primary pumonary hypertension; no significant wall motion abnormalities  . Esophagogastroduodenoscopy N/A 06/25/2013    Procedure: ESOPHAGOGASTRODUODENOSCOPY (EGD);  Surgeon: Florencia Reasons, MD;  Location: [facility] ENDOSCOPY;  Service: Endoscopy;  Laterality: N/A;  pediatric upper endoscope; no sedation    ZOX:WRUEAVW:UJ colds or fevers, 21 pound weight increase since  Skin:no rashes or ulcers HEENT:no blurred vision, no congestion CV:see HPI PUL:see HPI GI:no diarrhea constipation or melena, no indigestion GU:no hematuria, no dysuria MS:no joint pain, no claudication Neuro:no syncope, no lightheadedness Endo:+ diabetes, + thyroid disease  PHYSICAL EXAM BP 134/68  Pulse 88  Ht 5\' 1"  (1.549 m)  Wt 232 lb (105.235 kg)  BMI 43.86 kg/m2 General:Pleasant affect, NAD, oxygen in place nasal cannula Skin:Warm and dry, brisk capillary refill HEENT:normocephalic, sclera clear, mucus membranes moist Neck:supple, no JVD sitting upright, no bruits  Heart:S1S2 RRR without murmur, gallup, rub or click Lungs:clear with rales in the bases, no rhonchi, or wheezes WJX:BJYNW, soft, non tender, + BS, do not  palpate liver spleen or masses Ext:1-2+ lower ext edema to the knees,  2+ radial pulses Neuro:alert and oriented, MAE, follows commands, + facial symmetry  EKG:SR improved ST depression in lat leads.    ASSESSMENT AND PLAN Acute diastolic HF (heart failure), NYHA class 4 Her weight is up 21 lbs in 1 month.  H/H stable.  Renal CKD improved.  She is having trouble walking from bed to BR.  Though with oxygen she  is able to sleep with one pillow. + lower ext edema.  Reviewed with Dr. Rennis Golden.  Pt prefers not to go to hospital.  Will increase torsemide to 40 BID, add metolazone 5 mg prior to am torsemide dose for next 3 days.  She will follow up on Thursday with PA.  If no improvement she will need to be admitted.  We are asking home health to draw lab on Wed.  If she becomes more symptomatic before then she will call.  Chronic respiratory failure with hypoxia Continues to wear her oxygen  Pulmonary hypertension, severe; Severe by Echo 02/1713 Adjusting meds.  DM2 (diabetes mellitus, type 2) Followed by PCP

## 2013-08-09 NOTE — Assessment & Plan Note (Signed)
Her weight is up 21 lbs in 1 month.  H/H stable.  Renal CKD improved.  She is having trouble walking from bed to BR.  Though with oxygen she is able to sleep with one pillow. + lower ext edema.  Reviewed with Dr. Rennis Golden.  Pt prefers not to go to hospital.  Will increase torsemide to 40 BID, add metolazone 5 mg prior to am torsemide dose for next 3 days.  She will follow up on Thursday with PA.  If no improvement she will need to be admitted.  We are asking home health to draw lab on Wed.  If she becomes more symptomatic before then she will call.

## 2013-08-09 NOTE — Assessment & Plan Note (Signed)
Followed by PCP

## 2013-08-09 NOTE — Assessment & Plan Note (Signed)
Continues to wear her oxygen

## 2013-08-09 NOTE — Assessment & Plan Note (Signed)
Adjusting meds.

## 2013-08-09 NOTE — Telephone Encounter (Signed)
Rx was sent to pharmacy electronically. 

## 2013-08-11 ENCOUNTER — Telehealth: Payer: Self-pay | Admitting: Cardiology

## 2013-08-11 NOTE — Telephone Encounter (Signed)
Returned call and spoke w/ Alisa w/ [facility].  RN informed message received.  Alisa restated as listed below.  Stated O2 sat 89% is good for pt as she is normally at 92%.  Stated pt looks 100% better and she has drawn labs and will have results sent stat.  Pt has appt tomorrow in office for evaluation.  Stated pt w/ 2+ pitting edema in bilat feet, but soft and pt breathing well resting.  Informed RN wanted to make sure pt was okay with sats at 89%.  Will await results and see pt tomorrow.

## 2013-08-11 NOTE — Telephone Encounter (Signed)
Pt have lost 8lbs on the increased dose of Torsemide and Metolozone. Her O2 stat is 89 percent on 5 liters of oxygen,her breathing and swelling is greatly improved. She will draw lab today and send results to Vernona Rieger attention.

## 2013-08-11 NOTE — Telephone Encounter (Signed)
Great .. Good news!  -Dr. Rennis Golden

## 2013-08-12 ENCOUNTER — Ambulatory Visit (INDEPENDENT_AMBULATORY_CARE_PROVIDER_SITE_OTHER): Payer: Medicare Other | Admitting: Physician Assistant

## 2013-08-12 ENCOUNTER — Ambulatory Visit (HOSPITAL_COMMUNITY): Payer: Medicare Other

## 2013-08-12 ENCOUNTER — Other Ambulatory Visit (HOSPITAL_COMMUNITY): Payer: Medicare Other

## 2013-08-12 ENCOUNTER — Encounter: Payer: Self-pay | Admitting: Physician Assistant

## 2013-08-12 VITALS — BP 146/70 | HR 108 | Ht 61.0 in | Wt 222.0 lb

## 2013-08-12 DIAGNOSIS — J9610 Chronic respiratory failure, unspecified whether with hypoxia or hypercapnia: Secondary | ICD-10-CM

## 2013-08-12 DIAGNOSIS — I509 Heart failure, unspecified: Secondary | ICD-10-CM

## 2013-08-12 DIAGNOSIS — I2729 Other secondary pulmonary hypertension: Secondary | ICD-10-CM

## 2013-08-12 DIAGNOSIS — I2789 Other specified pulmonary heart diseases: Secondary | ICD-10-CM

## 2013-08-12 DIAGNOSIS — R0902 Hypoxemia: Secondary | ICD-10-CM

## 2013-08-12 DIAGNOSIS — I272 Pulmonary hypertension, unspecified: Secondary | ICD-10-CM

## 2013-08-12 DIAGNOSIS — I50810 Right heart failure, unspecified: Secondary | ICD-10-CM

## 2013-08-12 DIAGNOSIS — I5031 Acute diastolic (congestive) heart failure: Secondary | ICD-10-CM

## 2013-08-12 DIAGNOSIS — J9611 Chronic respiratory failure with hypoxia: Secondary | ICD-10-CM

## 2013-08-12 NOTE — Patient Instructions (Signed)
1.  Continue increase dose of Torsemide for the next three days with the metolazone.  If your weight decreases another 11 pounds, then reduce torsemide to 20mg  twice daily and stop the metolazone.  If your weight goes up 2 pounds in 24 hours or 5 pounds in a week, increase your morning dose of torsemide to 40mg  and take 20mg  at night.  2.  Follow up with Dr. Rennis Golden in three months.

## 2013-08-12 NOTE — Assessment & Plan Note (Signed)
Continuous O2

## 2013-08-12 NOTE — Assessment & Plan Note (Signed)
She is still 11 pounds up from 07/08/13.  I will continue current torsemide and metolazone dosing for three more days.  I have given the patient written instructions what to do after that as long as she looses 11 more pounds.  She will go back to torsemide 20mg  bid and titrate to 40qam and 20qpm as needed.  Apparently the labs that were ordered were drawn yesterday.  No results yet.

## 2013-08-12 NOTE — Progress Notes (Signed)
Date:  08/12/2013   ID:  [name], DOB [birth year], MRN [id number]  PCP:  Cassell Smiles., MD  Primary Cardiologist:  Rennis Golden     History of Present Illness: [name] is a 78 y.o.  morbidly obese female with a history of right heart failure, pulmonary hypertension and obstructive sleep apnea, intolerant to CPAP. Wears oxygen 24/7 She has had multiple admissions this year for acute on chronic diastolic HF. Her last echo was on 01/12/2013, which demonstrated normal LV function, with an EF of 65-70% and grade I diastolic dysfunction. She last saw Dr Rennis Golden 05/10/13 and her wgt was 182. Wt in [place] was 207, Nov 211 and now 232.   She was seen by Nada Boozer, NP three days ago with acute on chronic CHF.   She has diuresed 11 pounds in three days.  She feels better, is breathing better when lying down and the LEE has decreased.   The patient currently denies nausea, vomiting, fever, chest pain,  dizziness, PND, cough, congestion, abdominal pain, hematochezia, melena, claudication.  Wt Readings from Last 3 Encounters:  08/12/13 222 lb (100.699 kg)  08/09/13 232 lb (105.235 kg)  07/08/13 211 lb 14.4 oz (96.117 kg)     Past Medical History  Diagnosis Date  . Diabetes mellitus   . Hypertension   . Shingles   . GERD (gastroesophageal reflux disease)   . Abnormal heart rhythms   . Thyroid disease     hypthyroidism  . Pulmonary hypertension, moderate to severe     No evidence of CAD on Cath 2011  . Obesity, morbid (more than 100 lbs over ideal weight or BMI > 40)   . Chronic respiratory failure with hypoxia 01/23/2012    Related to OHS  . Glaucoma   . CHF (congestive heart failure)     Echo at [facility] 10/14/2012 see notes  . Acute on chronic renal failure     Current Outpatient Prescriptions  Medication Sig Dispense Refill  . ALPRAZolam (XANAX) 0.5 MG tablet Take 0.5 mg by mouth 2 (two) times daily. May take up to 4 times daily as prescribed      . bisacodyl (DULCOLAX) 10 MG  suppository Place 10 mg rectally as needed for constipation.      . brimonidine (ALPHAGAN P) 0.1 % SOLN Place 1 drop into both eyes daily.      Marland Kitchen docusate sodium (STOOL SOFTENER) 100 MG capsule Take 100 mg by mouth 2 (two) times daily.      Marland Kitchen ezetimibe (ZETIA) 10 MG tablet Take 10 mg by mouth at bedtime.        . fexofenadine (ALLEGRA) 180 MG tablet Take 180 mg by mouth every morning.      Marland Kitchen glipiZIDE (GLUCOTROL) 10 MG tablet Take 10 mg by mouth 2 (two) times daily before a meal.        . iron polysaccharides (NIFEREX) 150 MG capsule Take 1 capsule (150 mg total) by mouth daily.  90 capsule  3  . levothyroxine (SYNTHROID, LEVOTHROID) 150 MCG tablet Take 150 mcg by mouth daily before breakfast.       . linagliptin (TRADJENTA) 5 MG TABS tablet Take 5 mg by mouth every morning.       . metolazone (ZAROXOLYN) 5 MG tablet Take 1 tablet (5 mg total) by mouth daily. Take one daily before torsemide.  30 tablet  0  . oxyCODONE-acetaminophen (PERCOCET/ROXICET) 5-325 MG per tablet Take 1 tablet by mouth 2 (two) times  daily. May take 1 additional dose if needed for pain      . OXYGEN-HELIUM IN Inhale 4 L into the lungs continuous.      . pantoprazole (PROTONIX) 40 MG tablet Take 1 tablet (40 mg total) by mouth 2 (two) times daily.  60 tablet  5  . polyethylene glycol (MIRALAX / GLYCOLAX) packet Take 17 g by mouth daily.      . potassium chloride SA (K-DUR,KLOR-CON) 20 MEQ tablet Take 40 mEq by mouth daily.      . pravastatin (PRAVACHOL) 20 MG tablet TAKE ONE TABLET DAILY AT BEDTIME  30 tablet  11  . sulfamethoxazole-trimethoprim (BACTRIM DS,SEPTRA DS) 800-160 MG per tablet Take 1 tablet by mouth 2 (two) times daily.      Marland Kitchen torsemide (DEMADEX) 20 MG tablet Take 2 tablets (40 mg total) by mouth 2 (two) times daily.  120 tablet  6  . Travoprost, BAK Free, (TRAVATAN) 0.004 % SOLN ophthalmic solution Place 1 drop into both eyes at bedtime.       No current facility-administered medications for this visit.     Allergies:    Allergies  Allergen Reactions  . Aspirin Other (See Comments)    GI bleed/ PUD  . Darvocet [Propoxyphene-Acetaminophen] Nausea And Vomiting  . Diphenhydramine Hcl Other (See Comments)    Glaucoma prevents patient from taking this medication   . Aloe Vera Rash  . Penicillins Swelling and Rash    Social History:  The patient  reports that she has never smoked. Her smokeless tobacco use includes Snuff. She reports that she does not drink alcohol or use illicit drugs.   Family history:   Family History  Problem Relation Age of Onset  . Heart attack Father 68  . Heart disease Mother 58  . Diabetes Mother   . Lymphoma Daughter   . Diabetes      siblings, son  . Coronary artery disease Brother   . Diabetes Brother   . Stroke Brother   . Diabetes Brother   . Diabetes Sister   . Hypertension Sister   . Diabetes Sister   . Heart disease Sister   . Hypertension Child   . Sleep apnea Child   . Hypertension Child   . Hypertension Child   . Arthritis/Rheumatoid Child   . Hyperlipidemia Child   . Hypertension Child     ROS:  Please see the history of present illness.  All other systems reviewed and negative.   PHYSICAL EXAM: VS:  BP 146/70  Pulse 108  Ht 5\' 1"  (1.549 m)  Wt 222 lb (100.699 kg)  BMI 41.97 kg/m2 Obese, well developed, in no acute distress HEENT: Pupils are equal round react to light accommodation extraocular movements are intact.  Neck: No cervical lymphadenopathy. Cardiac: Regular rhythm rate slightly high without murmurs rubs or gallops. Lungs:  clear to auscultation bilaterally, no wheezing, rhonchi or rales Abd: soft, nontender, positive bowel sounds all quadrants,  Ext: 2+ lower extremity edema.  2+ radial and dorsalis pedis pulses. Skin: warm and dry Neuro:  Grossly normal  EKG:  Rate 103, Incompl RBBB. TWI.  ASSESSMENT AND PLAN:  Problem List Items Addressed This Visit   Chronic respiratory failure with hypoxia (Chronic)      Continuous O2    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 - With Acute on Chronic exaverbation (Chronic)   Pulmonary hypertension, severe; Severe by Echo 02/1713 (Chronic)   Acute diastolic HF (heart failure), NYHA class 4  She is still 11 pounds up from 07/08/13.  I will continue current torsemide and metolazone dosing for three more days.  I have given the patient written instructions what to do after that as long as she looses 11 more pounds.  She will go back to torsemide 20mg  bid and titrate to 40qam and 20qpm as needed.  Apparently the labs that were ordered were drawn yesterday.  No results yet.     Other Visit Diagnoses   Acute HF (heart failure)    -  Primary    Relevant Orders       EKG 12-Lead

## 2013-08-13 ENCOUNTER — Encounter: Payer: Self-pay | Admitting: Physician Assistant

## 2013-09-06 ENCOUNTER — Other Ambulatory Visit (HOSPITAL_COMMUNITY): Payer: Medicare Other

## 2013-09-06 ENCOUNTER — Encounter (HOSPITAL_COMMUNITY): Payer: Self-pay | Admitting: Emergency Medicine

## 2013-09-06 ENCOUNTER — Telehealth: Payer: Self-pay | Admitting: Internal Medicine

## 2013-09-06 ENCOUNTER — Emergency Department (HOSPITAL_COMMUNITY): Payer: PRIVATE HEALTH INSURANCE

## 2013-09-06 ENCOUNTER — Inpatient Hospital Stay (HOSPITAL_COMMUNITY)
Admission: EM | Admit: 2013-09-06 | Discharge: 2013-09-15 | DRG: 314 | Disposition: A | Discharge status: Home / Self Care | Payer: PRIVATE HEALTH INSURANCE | Attending: Internal Medicine | Admitting: Internal Medicine

## 2013-09-06 ENCOUNTER — Ambulatory Visit (HOSPITAL_COMMUNITY)
Admission: RE | Admit: 2013-09-06 | Discharge: 2013-09-06 | Disposition: A | Discharge status: Home / Self Care | Payer: PRIVATE HEALTH INSURANCE | Source: Ambulatory Visit | Attending: Family Medicine | Admitting: Family Medicine

## 2013-09-06 DIAGNOSIS — J189 Pneumonia, unspecified organism: Secondary | ICD-10-CM | POA: Diagnosis present

## 2013-09-06 DIAGNOSIS — E039 Hypothyroidism, unspecified: Secondary | ICD-10-CM | POA: Diagnosis present

## 2013-09-06 DIAGNOSIS — I5033 Acute on chronic diastolic (congestive) heart failure: Secondary | ICD-10-CM | POA: Diagnosis present

## 2013-09-06 DIAGNOSIS — E079 Disorder of thyroid, unspecified: Secondary | ICD-10-CM | POA: Diagnosis present

## 2013-09-06 DIAGNOSIS — Z0389 Encounter for observation for other suspected diseases and conditions ruled out: Secondary | ICD-10-CM

## 2013-09-06 DIAGNOSIS — N179 Acute kidney failure, unspecified: Secondary | ICD-10-CM | POA: Diagnosis present

## 2013-09-06 DIAGNOSIS — R0902 Hypoxemia: Secondary | ICD-10-CM

## 2013-09-06 DIAGNOSIS — Z9981 Dependence on supplemental oxygen: Secondary | ICD-10-CM

## 2013-09-06 DIAGNOSIS — I959 Hypotension, unspecified: Secondary | ICD-10-CM

## 2013-09-06 DIAGNOSIS — D696 Thrombocytopenia, unspecified: Secondary | ICD-10-CM | POA: Diagnosis present

## 2013-09-06 DIAGNOSIS — E871 Hypo-osmolality and hyponatremia: Secondary | ICD-10-CM

## 2013-09-06 DIAGNOSIS — R531 Weakness: Secondary | ICD-10-CM

## 2013-09-06 DIAGNOSIS — E662 Morbid (severe) obesity with alveolar hypoventilation: Secondary | ICD-10-CM | POA: Diagnosis present

## 2013-09-06 DIAGNOSIS — E869 Volume depletion, unspecified: Secondary | ICD-10-CM

## 2013-09-06 DIAGNOSIS — E876 Hypokalemia: Secondary | ICD-10-CM | POA: Diagnosis not present

## 2013-09-06 DIAGNOSIS — IMO0001 Reserved for inherently not codable concepts without codable children: Secondary | ICD-10-CM

## 2013-09-06 DIAGNOSIS — K279 Peptic ulcer, site unspecified, unspecified as acute or chronic, without hemorrhage or perforation: Secondary | ICD-10-CM | POA: Diagnosis present

## 2013-09-06 DIAGNOSIS — I2789 Other specified pulmonary heart diseases: Principal | ICD-10-CM | POA: Diagnosis present

## 2013-09-06 DIAGNOSIS — G4733 Obstructive sleep apnea (adult) (pediatric): Secondary | ICD-10-CM | POA: Diagnosis present

## 2013-09-06 DIAGNOSIS — N189 Chronic kidney disease, unspecified: Secondary | ICD-10-CM | POA: Diagnosis present

## 2013-09-06 DIAGNOSIS — D62 Acute posthemorrhagic anemia: Secondary | ICD-10-CM | POA: Diagnosis present

## 2013-09-06 DIAGNOSIS — Z6841 Body Mass Index (BMI) 40.0 and over, adult: Secondary | ICD-10-CM

## 2013-09-06 DIAGNOSIS — D509 Iron deficiency anemia, unspecified: Secondary | ICD-10-CM | POA: Diagnosis present

## 2013-09-06 DIAGNOSIS — J9611 Chronic respiratory failure with hypoxia: Secondary | ICD-10-CM | POA: Diagnosis present

## 2013-09-06 DIAGNOSIS — J449 Chronic obstructive pulmonary disease, unspecified: Secondary | ICD-10-CM | POA: Diagnosis present

## 2013-09-06 DIAGNOSIS — E119 Type 2 diabetes mellitus without complications: Secondary | ICD-10-CM | POA: Diagnosis present

## 2013-09-06 DIAGNOSIS — J4489 Other specified chronic obstructive pulmonary disease: Secondary | ICD-10-CM | POA: Diagnosis present

## 2013-09-06 DIAGNOSIS — I2729 Other secondary pulmonary hypertension: Secondary | ICD-10-CM | POA: Diagnosis present

## 2013-09-06 DIAGNOSIS — J9610 Chronic respiratory failure, unspecified whether with hypoxia or hypercapnia: Secondary | ICD-10-CM | POA: Diagnosis present

## 2013-09-06 DIAGNOSIS — I509 Heart failure, unspecified: Secondary | ICD-10-CM | POA: Diagnosis present

## 2013-09-06 DIAGNOSIS — I129 Hypertensive chronic kidney disease with stage 1 through stage 4 chronic kidney disease, or unspecified chronic kidney disease: Secondary | ICD-10-CM | POA: Diagnosis present

## 2013-09-06 DIAGNOSIS — I5032 Chronic diastolic (congestive) heart failure: Secondary | ICD-10-CM | POA: Diagnosis present

## 2013-09-06 DIAGNOSIS — Z139 Encounter for screening, unspecified: Secondary | ICD-10-CM

## 2013-09-06 DIAGNOSIS — I50810 Right heart failure, unspecified: Secondary | ICD-10-CM

## 2013-09-06 DIAGNOSIS — T502X5A Adverse effect of carbonic-anhydrase inhibitors, benzothiadiazides and other diuretics, initial encounter: Secondary | ICD-10-CM | POA: Diagnosis not present

## 2013-09-06 DIAGNOSIS — I272 Pulmonary hypertension, unspecified: Secondary | ICD-10-CM

## 2013-09-06 DIAGNOSIS — J9620 Acute and chronic respiratory failure, unspecified whether with hypoxia or hypercapnia: Secondary | ICD-10-CM | POA: Diagnosis present

## 2013-09-06 DIAGNOSIS — I5031 Acute diastolic (congestive) heart failure: Secondary | ICD-10-CM

## 2013-09-06 DIAGNOSIS — Z993 Dependence on wheelchair: Secondary | ICD-10-CM

## 2013-09-06 LAB — COMPREHENSIVE METABOLIC PANEL
ALT: 10 U/L (ref 0–35)
AST: 22 U/L (ref 0–37)
Albumin: 4.1 g/dL (ref 3.5–5.2)
Alkaline Phosphatase: 141 U/L — ABNORMAL HIGH (ref 39–117)
BUN: 30 mg/dL — AB (ref 6–23)
CO2: 30 meq/L (ref 19–32)
Calcium: 9.6 mg/dL (ref 8.4–10.5)
Chloride: 93 mEq/L — ABNORMAL LOW (ref 96–112)
Creatinine, Ser: 1.3 mg/dL — ABNORMAL HIGH (ref 0.50–1.10)
GFR calc non Af Amer: 39 mL/min — ABNORMAL LOW (ref >90)
GFR, EST AFRICAN AMERICAN: 45 mL/min — AB (ref >90)
GLUCOSE: 114 mg/dL — AB (ref 70–99)
POTASSIUM: 3.4 meq/L — AB (ref 3.7–5.3)
Sodium: 139 mEq/L (ref 137–147)
Total Bilirubin: 0.5 mg/dL (ref 0.3–1.2)
Total Protein: 7 g/dL (ref 6.0–8.3)

## 2013-09-06 LAB — CBC WITH DIFFERENTIAL/PLATELET
BASOS PCT: 1 % (ref 0–1)
Basophils Absolute: 0.1 10*3/uL (ref 0.0–0.1)
EOS PCT: 1 % (ref 0–5)
Eosinophils Absolute: 0.1 10*3/uL (ref 0.0–0.7)
HCT: 34.1 % — ABNORMAL LOW (ref 36.0–46.0)
Hemoglobin: 10.2 g/dL — ABNORMAL LOW (ref 12.0–15.0)
LYMPHS ABS: 1.4 10*3/uL (ref 0.7–4.0)
Lymphocytes Relative: 16 % (ref 12–46)
MCH: 19.5 pg — AB (ref 26.0–34.0)
MCHC: 29.9 g/dL — ABNORMAL LOW (ref 30.0–36.0)
MCV: 65.3 fL — AB (ref 78.0–100.0)
Monocytes Absolute: 1.2 10*3/uL — ABNORMAL HIGH (ref 0.1–1.0)
Monocytes Relative: 13 % — ABNORMAL HIGH (ref 3–12)
NEUTROS ABS: 6.1 10*3/uL (ref 1.7–7.7)
Neutrophils Relative %: 69 % (ref 43–77)
Platelets: 160 10*3/uL (ref 150–400)
RBC: 5.22 MIL/uL — AB (ref 3.87–5.11)
RDW: 18.3 % — ABNORMAL HIGH (ref 11.5–15.5)
WBC: 8.9 10*3/uL (ref 4.0–10.5)

## 2013-09-06 LAB — POCT I-STAT 3, ART BLOOD GAS (G3+)
Acid-Base Excess: 6 mmol/L — ABNORMAL HIGH (ref 0.0–2.0)
Bicarbonate: 29 mEq/L — ABNORMAL HIGH (ref 20.0–24.0)
O2 Saturation: 88 %
Patient temperature: 97.7
TCO2: 30 mmol/L (ref 0–100)
pCO2 arterial: 36.1 mmHg (ref 35.0–45.0)
pH, Arterial: 7.512 — ABNORMAL HIGH (ref 7.350–7.450)
pO2, Arterial: 48 mmHg — ABNORMAL LOW (ref 80.0–100.0)

## 2013-09-06 LAB — PRO B NATRIURETIC PEPTIDE: Pro B Natriuretic peptide (BNP): 2771 pg/mL — ABNORMAL HIGH (ref 0–450)

## 2013-09-06 LAB — CG4 I-STAT (LACTIC ACID): Lactic Acid, Venous: 1.31 mmol/L (ref 0.5–2.2)

## 2013-09-06 LAB — POCT I-STAT TROPONIN I: TROPONIN I, POC: 0.03 ng/mL (ref 0.00–0.08)

## 2013-09-06 LAB — TROPONIN I

## 2013-09-06 MED ORDER — FUROSEMIDE 10 MG/ML IJ SOLN
40.0000 mg | Freq: Once | INTRAMUSCULAR | Status: AC
  Administered 2013-09-06: 40 mg via INTRAVENOUS
  Filled 2013-09-06: qty 4

## 2013-09-06 MED ORDER — POTASSIUM CHLORIDE CRYS ER 20 MEQ PO TBCR
40.0000 meq | EXTENDED_RELEASE_TABLET | Freq: Once | ORAL | Status: AC
  Administered 2013-09-06: 40 meq via ORAL
  Filled 2013-09-06: qty 2

## 2013-09-06 MED ORDER — IPRATROPIUM BROMIDE 0.02 % IN SOLN
0.5000 mg | Freq: Once | RESPIRATORY_TRACT | Status: DC
  Filled 2013-09-06: qty 2.5

## 2013-09-06 MED ORDER — ALBUTEROL (5 MG/ML) CONTINUOUS INHALATION SOLN
10.0000 mg/h | INHALATION_SOLUTION | RESPIRATORY_TRACT | Status: AC
  Filled 2013-09-06: qty 20

## 2013-09-06 NOTE — ED Notes (Signed)
Pt is here with copd and increasing sob especially with exertion.  Pt is currently on home 02/4L.  Pt reports some dizziness in am.  No chest pain

## 2013-09-06 NOTE — ED Provider Notes (Signed)
Patient care assumed from [facility], PA-C at shift change. Patient presenting with 1 week gradually worsening shortness of breath increased orthopnea and dyspnea on exertion despite at-home chronic 4 L of O2. Patient states she has tried increasing her oxygen to 5 L and taking her 20 mg Lasix when necessary on top of 40 mg daily without any improvement.   Patient has been given IV Lasix in ED for symptoms. She endorses improvement in her SOB after Lasix administration. She does not appear visibly dyspneic and patient not tachypneic. She is, however, still satting 89-91% on 6L O2 [place]. HR 102bpm and lung sounds diminished in bases b/l. Will consult IM Unassigned for admission for acute CHF exacerbation. Cardiologist Dr. Debara Pickett of [facility].  Dr. Roel Cluck to admit.   Results for orders placed during the hospital encounter of 09/06/13  PRO B NATRIURETIC PEPTIDE      Result Value Range   Pro B Natriuretic peptide (BNP) 2771.0 (*) 0 - 450 pg/mL  CBC WITH DIFFERENTIAL      Result Value Range   WBC 8.9  4.0 - 10.5 K/uL   RBC 5.22 (*) 3.87 - 5.11 MIL/uL   Hemoglobin 10.2 (*) 12.0 - 15.0 g/dL   HCT 34.1 (*) 36.0 - 46.0 %   MCV 65.3 (*) 78.0 - 100.0 fL   MCH 19.5 (*) 26.0 - 34.0 pg   MCHC 29.9 (*) 30.0 - 36.0 g/dL   RDW 18.3 (*) 11.5 - 15.5 %   Platelets 160  150 - 400 K/uL   Neutrophils Relative % 69  43 - 77 %   Lymphocytes Relative 16  12 - 46 %   Monocytes Relative 13 (*) 3 - 12 %   Eosinophils Relative 1  0 - 5 %   Basophils Relative 1  0 - 1 %   Neutro Abs 6.1  1.7 - 7.7 K/uL   Lymphs Abs 1.4  0.7 - 4.0 K/uL   Monocytes Absolute 1.2 (*) 0.1 - 1.0 K/uL   Eosinophils Absolute 0.1  0.0 - 0.7 K/uL   Basophils Absolute 0.1  0.0 - 0.1 K/uL   RBC Morphology POLYCHROMASIA PRESENT     WBC Morphology ATYPICAL LYMPHOCYTES     Smear Review LARGE PLATELETS PRESENT    COMPREHENSIVE METABOLIC PANEL      Result Value Range   Sodium 139  137 - 147 mEq/L   Potassium 3.4 (*) 3.7 - 5.3  mEq/L   Chloride 93 (*) 96 - 112 mEq/L   CO2 30  19 - 32 mEq/L   Glucose, Bld 114 (*) 70 - 99 mg/dL   BUN 30 (*) 6 - 23 mg/dL   Creatinine, Ser 1.30 (*) 0.50 - 1.10 mg/dL   Calcium 9.6  8.4 - 10.5 mg/dL   Total Protein 7.0  6.0 - 8.3 g/dL   Albumin 4.1  3.5 - 5.2 g/dL   AST 22  0 - 37 U/L   ALT 10  0 - 35 U/L   Alkaline Phosphatase 141 (*) 39 - 117 U/L   Total Bilirubin 0.5  0.3 - 1.2 mg/dL   GFR calc non Af Amer 39 (*) >90 mL/min   GFR calc Af Amer 45 (*) >90 mL/min  TROPONIN I      Result Value Range   Troponin I <0.30  <0.30 ng/mL  POCT I-STAT TROPONIN I      Result Value Range   Troponin i, poc 0.03  0.00 - 0.08 ng/mL  Comment 3           POCT I-STAT 3, BLOOD GAS (G3+)      Result Value Range   pH, Arterial 7.512 (*) 7.350 - 7.450   pCO2 arterial 36.1  35.0 - 45.0 mmHg   pO2, Arterial 48.0 (*) 80.0 - 100.0 mmHg   Bicarbonate 29.0 (*) 20.0 - 24.0 mEq/L   TCO2 30  0 - 100 mmol/L   O2 Saturation 88.0     Acid-Base Excess 6.0 (*) 0.0 - 2.0 mmol/L   Patient temperature 97.7 F     Collection site RADIAL, ALLEN'S TEST ACCEPTABLE     Drawn by RT     Sample type ARTERIAL    CG4 I-STAT (LACTIC ACID)      Result Value Range   Lactic Acid, Venous 1.31  0.5 - 2.2 mmol/L   Dg Chest Portable 1 View  09/06/2013   CLINICAL DATA:  Short of breath for 1 week. Worsening shortness of breath.  EXAM: PORTABLE CHEST - 1 VIEW  COMPARISON:  DG CHEST 1V PORT dated 04/04/2013; DG CHEST 1V PORT dated 03/15/2013; DG CHEST 1V PORT dated 03/13/2013; DG CHEST 2 VIEW dated 03/08/2013  FINDINGS: Patient is rotated to the right. Oxygen tubing projects over the chest. The cardiopericardial silhouette appears enlarged. Pulmonary vascular congestion is present. There is a new right pleural effusion and right basilar atelectasis and partial collapse. Underlying airspace disease cannot be excluded. Mild left basilar atelectasis is present. Lucency is present in the retrocardiac area which is favored to be  projectional although a hiatal hernia could produce this appearance as well.  IMPRESSION: New right pleural effusion and basilar collapse/consolidation. The appearance is similar to the prior exam 03/13/2013 and failure with asymmetric effusion and edema is favored. Pneumonia or aspiration or in the differential considerations.   Electronically Signed   By: Dereck Ligas M.D.   On: 09/06/2013 19:48      Antonietta Breach, PA-C 09/06/13 2219

## 2013-09-06 NOTE — ED Notes (Signed)
Lactic Acid results reported to Dr.Rancour 

## 2013-09-06 NOTE — ED Provider Notes (Signed)
CSN: [id number]     Arrival date & time 09/06/13  1802 History   First MD Initiated Contact with Patient 09/06/13 1853     Chief Complaint  Patient presents with  . Shortness of Breath   (Consider location/radiation/quality/duration/timing/severity/associated sxs/prior Treatment) HPI Comments: Patient is a 79 year old female past medical history significant for DM, HTN, GERD, thyroid disease, COPD on 4 L of oxygen at home, chronic respiratory failure with hypoxia, CHF, renal disorder presenting to the emergency department for one week of worsening shortness of breath. The patient states she had a cold a week or 2 prior to the onset of her worsening shortness of breath. Patient states she is more dyspneic on exertion and has increased orthopnea from baseline. Patient has been taking an additional (nml 40mg  Lasix) 20 mg Lasix daily as advised by Cardiologist for increased fluid overload three time this last week without improvement of her symptoms. She has also increased her oxygen to 5L at home with minimal improvement of her symptoms. Denies CP, emesis, abdominal pain, diarrhea, syncope. Patient had an echocardiogram within the last year. She has had a cardiac catheterization several years ago. Patient has been admitted for COPD and CHF exacerbations but never required intubation.   Patient is a 79 y.o. female presenting with shortness of breath.  Shortness of Breath Associated symptoms: cough   Associated symptoms: no chest pain and no fever     Past Medical History  Diagnosis Date  . Diabetes mellitus   . Hypertension   . Shingles   . GERD (gastroesophageal reflux disease)   . Abnormal heart rhythms   . Thyroid disease     hypthyroidism  . Pulmonary hypertension, moderate to severe     No evidence of CAD on Cath 2011  . Obesity, morbid (more than 100 lbs over ideal weight or BMI > 40)   . Chronic respiratory failure with hypoxia 01/23/2012    Related to OHS  . Glaucoma   . CHF  (congestive heart failure)     Echo at [facility] 10/14/2012 see notes  . Acute on chronic renal failure   . COPD (chronic obstructive pulmonary disease)    Past Surgical History  Procedure Laterality Date  . Tubal ligation    . Ovary removed    . Knee arthroscopy      left  . Cardiovascular stress test  09/15/2006    EF 81%; normal perfusion all regions; LV normal in size; no scintigraphic evidence of inducible myocardial ischemia  . Cardiac catheterization  02/21/2010    EF 65%; PA pressure 86/21 with mean of 44; normal coronary arteries, primary pumonary hypertension; no significant wall motion abnormalities  . Esophagogastroduodenoscopy N/A 06/25/2013    Procedure: ESOPHAGOGASTRODUODENOSCOPY (EGD);  Surgeon: Cleotis Nipper, MD;  Location: [facility] ENDOSCOPY;  Service: Endoscopy;  Laterality: N/A;  pediatric upper endoscope; no sedation   Family History  Problem Relation Age of Onset  . Heart attack Father 6  . Heart disease Mother 26  . Diabetes Mother   . Lymphoma Daughter   . Diabetes      siblings, son  . Coronary artery disease Brother   . Diabetes Brother   . Stroke Brother   . Diabetes Brother   . Diabetes Sister   . Hypertension Sister   . Diabetes Sister   . Heart disease Sister   . Hypertension Child   . Sleep apnea Child   . Hypertension Child   . Hypertension Child   . Arthritis/Rheumatoid Child   .  Hyperlipidemia Child   . Hypertension Child    History  Substance Use Topics  . Smoking status: Never Smoker   . Smokeless tobacco: Current User    Types: Snuff  . Alcohol Use: No   OB History   Grav Para Term Preterm Abortions TAB SAB Ect Mult Living                 Review of Systems  Constitutional: Negative for fever and chills.  Respiratory: Positive for cough and shortness of breath.   Cardiovascular: Positive for leg swelling (bilateral). Negative for chest pain.  Neurological: Negative for syncope.  All other systems reviewed and are  negative.    Allergies  Aspirin; Darvocet; Diphenhydramine hcl; Aloe vera; and Penicillins  Home Medications   Current Outpatient Rx  Name  Route  Sig  Dispense  Refill  . ALPRAZolam (XANAX) 0.5 MG tablet   Oral   Take 0.5 mg by mouth 2 (two) times daily. May take up to 4 times daily as prescribed         . bisacodyl (DULCOLAX) 10 MG suppository   Rectal   Place 10 mg rectally as needed for constipation.         . brimonidine (ALPHAGAN P) 0.1 % SOLN   Both Eyes   Place 1 drop into both eyes daily.         Marland Kitchen docusate sodium (STOOL SOFTENER) 100 MG capsule   Oral   Take 100 mg by mouth 2 (two) times daily.         Marland Kitchen ezetimibe (ZETIA) 10 MG tablet   Oral   Take 10 mg by mouth at bedtime.           . fexofenadine (ALLEGRA) 180 MG tablet   Oral   Take 180 mg by mouth every morning.         Marland Kitchen glipiZIDE (GLUCOTROL) 10 MG tablet   Oral   Take 10 mg by mouth 2 (two) times daily before a meal.           . iron polysaccharides (NIFEREX) 150 MG capsule   Oral   Take 1 capsule (150 mg total) by mouth daily.   90 capsule   3   . levothyroxine (SYNTHROID, LEVOTHROID) 150 MCG tablet   Oral   Take 150 mcg by mouth daily before breakfast.          . linagliptin (TRADJENTA) 5 MG TABS tablet   Oral   Take 5 mg by mouth every morning.          . metolazone (ZAROXOLYN) 5 MG tablet   Oral   Take 1 tablet (5 mg total) by mouth daily. Take one daily before torsemide.   30 tablet   0   . oxyCODONE-acetaminophen (PERCOCET/ROXICET) 5-325 MG per tablet   Oral   Take 1 tablet by mouth 2 (two) times daily. May take 1 additional dose if needed for pain         . OXYGEN-HELIUM IN   Inhalation   Inhale 4 L into the lungs continuous.         . pantoprazole (PROTONIX) 40 MG tablet   Oral   Take 1 tablet (40 mg total) by mouth 2 (two) times daily.   60 tablet   5   . polyethylene glycol (MIRALAX / GLYCOLAX) packet   Oral   Take 17 g by mouth daily.          . potassium chloride  SA (K-DUR,KLOR-CON) 20 MEQ tablet   Oral   Take 40 mEq by mouth daily.         . pravastatin (PRAVACHOL) 20 MG tablet   Oral   Take 20 mg by mouth daily.         Marland Kitchen torsemide (DEMADEX) 20 MG tablet   Oral   Take 40 mg by mouth daily.         . Travoprost, BAK Free, (TRAVATAN) 0.004 % SOLN ophthalmic solution   Both Eyes   Place 1 drop into both eyes at bedtime.          BP 106/60  Pulse 100  Temp(Src) 97.7 F (36.5 C) (Oral)  Resp 18  Wt 224 lb 2 oz (101.662 kg)  SpO2 89% Physical Exam  Constitutional: She is oriented to person, place, and time. She appears well-developed and well-nourished. Nasal cannula in place.  HENT:  Head: Normocephalic and atraumatic.  Right Ear: External ear normal.  Left Ear: External ear normal.  Nose: Nose normal.  Eyes: Conjunctivae are normal.  Neck: Neck supple.  Cardiovascular: Regular rhythm, normal heart sounds and intact distal pulses.  Tachycardia present.   Pulmonary/Chest: Accessory muscle usage present. Tachypnea noted. She has decreased breath sounds in the right lower field and the left lower field. She has no wheezes. She exhibits no tenderness.  Diminished breath sounds bilateral lower lung fields.   Abdominal: Soft. Bowel sounds are normal. She exhibits no distension. There is no tenderness.  Musculoskeletal: Normal range of motion. She exhibits edema (2+ bilateral). She exhibits no tenderness.  Neurological: She is alert and oriented to person, place, and time.  Skin: Skin is warm and dry. She is not diaphoretic.    ED Course  Procedures (including critical care time)  Medications  albuterol (PROVENTIL,VENTOLIN) solution continuous neb (10 mg/hr Nebulization Not Given 09/06/13 1915)  ipratropium (ATROVENT) nebulizer solution 0.5 mg (not administered)  furosemide (LASIX) injection 40 mg (40 mg Intravenous Given 09/06/13 2013)  potassium chloride SA (K-DUR,KLOR-CON) CR tablet 40 mEq (40 mEq Oral  Given 09/06/13 2013)    Labs Review Labs Reviewed  PRO B NATRIURETIC PEPTIDE - Abnormal; Notable for the following:    Pro B Natriuretic peptide (BNP) 2771.0 (*)    All other components within normal limits  CBC WITH DIFFERENTIAL - Abnormal; Notable for the following:    RBC 5.22 (*)    Hemoglobin 10.2 (*)    HCT 34.1 (*)    MCV 65.3 (*)    MCH 19.5 (*)    MCHC 29.9 (*)    RDW 18.3 (*)    Monocytes Relative 13 (*)    Monocytes Absolute 1.2 (*)    All other components within normal limits  COMPREHENSIVE METABOLIC PANEL - Abnormal; Notable for the following:    Potassium 3.4 (*)    Chloride 93 (*)    Glucose, Bld 114 (*)    BUN 30 (*)    Creatinine, Ser 1.30 (*)    Alkaline Phosphatase 141 (*)    GFR calc non Af Amer 39 (*)    GFR calc Af Amer 45 (*)    All other components within normal limits  POCT I-STAT 3, BLOOD GAS (G3+) - Abnormal; Notable for the following:    pH, Arterial 7.512 (*)    pO2, Arterial 48.0 (*)    Bicarbonate 29.0 (*)    Acid-Base Excess 6.0 (*)    All other components within normal limits  TROPONIN I  BLOOD  GAS, ARTERIAL  POCT I-STAT TROPONIN I  CG4 I-STAT (LACTIC ACID)   Imaging Review Dg Chest Portable 1 View  09/06/2013   CLINICAL DATA:  Short of breath for 1 week. Worsening shortness of breath.  EXAM: PORTABLE CHEST - 1 VIEW  COMPARISON:  DG CHEST 1V PORT dated 04/04/2013; DG CHEST 1V PORT dated 03/15/2013; DG CHEST 1V PORT dated 03/13/2013; DG CHEST 2 VIEW dated 03/08/2013  FINDINGS: Patient is rotated to the right. Oxygen tubing projects over the chest. The cardiopericardial silhouette appears enlarged. Pulmonary vascular congestion is present. There is a new right pleural effusion and right basilar atelectasis and partial collapse. Underlying airspace disease cannot be excluded. Mild left basilar atelectasis is present. Lucency is present in the retrocardiac area which is favored to be projectional although a hiatal hernia could produce this appearance  as well.  IMPRESSION: New right pleural effusion and basilar collapse/consolidation. The appearance is similar to the prior exam 03/13/2013 and failure with asymmetric effusion and edema is favored. Pneumonia or aspiration or in the differential considerations.   Electronically Signed   By: Dereck Ligas M.D.   On: 09/06/2013 19:48    EKG Interpretation    Date/Time:  Monday September 06 2013 18:21:17 EST Ventricular Rate:  108 PR Interval:  180 QRS Duration: 96 QT Interval:  328 QTC Calculation: 439 R Axis:   135 Text Interpretation:  Sinus tachycardia with Premature atrial complexes Right axis deviation Incomplete right bundle branch block Possible Right ventricular hypertrophy Nonspecific T wave abnormality Abnormal ECG Nonspecific ST and T wave abnormality Confirmed by Cullen ([id number]) on 09/06/2013 8:05:42 PM            MDM  No diagnosis found.  Afebrile, NAD, non-toxic appearing, AAOx4.   Patient presenting with 1 week gradually worsening shortness of breath increased orthopnea and dyspnea on exertion despite at-home chronic 4 L of O2. Patient states she has tried increasing her oxygen to 5 L and taking her 20 mg Lasix when necessary on top of 40 mg daily without any improvement of her bilateral lower leg swelling and shortness of breath. Patient with diminished breath sounds in lower lung fields. Patient with some accessory muscle use. CXR consistent with CHF exacerbation. BNP remains elevated to similar admission levels. I have reviewed nursing notes, vital signs, and all appropriate lab and imaging results for this patient. Patient will require admission for CHF exacerbation. Pending Lasix administration. Signed out to [employer], [employer]. Patient d/w with Dr. Wyvonnia Dusky agrees with plan.       Harlow Mares, PA-C 09/06/13 2027

## 2013-09-06 NOTE — ED Notes (Signed)
Pt complaining of knots to right abdominal area

## 2013-09-06 NOTE — ED Provider Notes (Signed)
Medical screening examination/treatment/procedure(s) were conducted as a shared visit with non-physician practitioner(s) and myself.  I personally evaluated the patient during the encounter.  EKG Interpretation    Date/Time:  Monday September 06 2013 18:21:17 EST Ventricular Rate:  108 PR Interval:  180 QRS Duration: 96 QT Interval:  328 QTC Calculation: 439 R Axis:   135 Text Interpretation:  Sinus tachycardia with Premature atrial complexes Right axis deviation Incomplete right bundle branch block Possible Right ventricular hypertrophy Nonspecific T wave abnormality Abnormal ECG Nonspecific ST and T wave abnormality Confirmed by Wyvonnia Dusky  MD, Vyncent Overby ([id number]) on 09/06/2013 8:05:42 PM              Ezequiel [place], MD 09/06/13 2340

## 2013-09-06 NOTE — H&P (Signed)
PCP:  Glo Herring., MD  Cardiology Dr. Debara Pickett   Chief Complaint:  Shortness of breath  HPI: [name] is a 79 y.o. female   has a past medical history of Diabetes mellitus; Hypertension; Shingles; GERD (gastroesophageal reflux disease); Abnormal heart rhythms; Thyroid disease; Pulmonary hypertension, moderate to severe; Obesity, morbid (more than 100 lbs over ideal weight or BMI > 40); Chronic respiratory failure with hypoxia (01/23/2012); Glaucoma; CHF (congestive heart failure); Acute on chronic renal failure; and COPD (chronic obstructive pulmonary disease).   Presented with  2 week hx of shortness of breath, increased leg swelling. She has gained 10 lb in the past few days. Denies any chest pain. But has increased work of breathing. A home she is on 4 L of oxygen but required up to 6-8L  in ER to maintain Oxygen sats of 91% confirmed by ABG. Patient reports hx of COPD. Denies any fever or chills. Patient gt Lasix 40 mg IV in ER and had some diuresis. Feels better. CXR showing right pleural effusion and basilar collapse/consolidation clinically pneumonia less likely.   Review of Systems:    Pertinent positives include: leg swelling, shortness of breath at rest. dyspnea on exertion,  Constitutional:  No weight loss, night sweats, Fevers, chills, fatigue, weight loss  HEENT:  No headaches, Difficulty swallowing,Tooth/dental problems,Sore throat,  No sneezing, itching, ear ache, nasal congestion, post nasal drip,  Cardio-vascular:  No chest pain, Orthopnea, PND, anasarca, dizziness, palpitations.no Bilateral lower extremity swelling  GI:  No heartburn, indigestion, abdominal pain, nausea, vomiting, diarrhea, change in bowel habits, loss of appetite, melena, blood in stool, hematemesis Resp:    No excess mucus, no productive cough, No non-productive cough, No coughing up of blood.No change in color of mucus.No wheezing. Skin:  no rash or lesions. No jaundice GU:  no dysuria,  change in color of urine, no urgency or frequency. No straining to urinate.  No flank pain.  Musculoskeletal:  No joint pain or no joint swelling. No decreased range of motion. No back pain.  Psych:  No change in mood or affect. No depression or anxiety. No memory loss.  Neuro: no localizing neurological complaints, no tingling, no weakness, no double vision, no gait abnormality, no slurred speech, no confusion  Otherwise ROS are negative except for above, 10 systems were reviewed  Past Medical History: Past Medical History  Diagnosis Date  . Diabetes mellitus   . Hypertension   . Shingles   . GERD (gastroesophageal reflux disease)   . Abnormal heart rhythms   . Thyroid disease     hypthyroidism  . Pulmonary hypertension, moderate to severe     No evidence of CAD on Cath 2011  . Obesity, morbid (more than 100 lbs over ideal weight or BMI > 40)   . Chronic respiratory failure with hypoxia 01/23/2012    Related to OHS  . Glaucoma   . CHF (congestive heart failure)     Echo at [facility] 10/14/2012 see notes  . Acute on chronic renal failure   . COPD (chronic obstructive pulmonary disease)    Past Surgical History  Procedure Laterality Date  . Tubal ligation    . Ovary removed    . Knee arthroscopy      left  . Cardiovascular stress test  09/15/2006    EF 81%; normal perfusion all regions; LV normal in size; no scintigraphic evidence of inducible myocardial ischemia  . Cardiac catheterization  02/21/2010    EF 65%; PA pressure 86/21 with  mean of 44; normal coronary arteries, primary pumonary hypertension; no significant wall motion abnormalities  . Esophagogastroduodenoscopy N/A 06/25/2013    Procedure: ESOPHAGOGASTRODUODENOSCOPY (EGD);  Surgeon: Cleotis Nipper, MD;  Location: [facility] ENDOSCOPY;  Service: Endoscopy;  Laterality: N/A;  pediatric upper endoscope; no sedation     Medications: Prior to Admission medications   Medication Sig Start Date End Date Taking? Authorizing Provider   ALPRAZolam Duanne Moron) 0.5 MG tablet Take 0.5 mg by mouth 2 (two) times daily. May take up to 4 times daily as prescribed   Yes Historical Provider, MD  bisacodyl (DULCOLAX) 10 MG suppository Place 10 mg rectally as needed for constipation.   Yes Historical Provider, MD  brimonidine (ALPHAGAN P) 0.1 % SOLN Place 1 drop into both eyes daily.   Yes Historical Provider, MD  docusate sodium (STOOL SOFTENER) 100 MG capsule Take 100 mg by mouth 2 (two) times daily.   Yes Historical Provider, MD  ezetimibe (ZETIA) 10 MG tablet Take 10 mg by mouth at bedtime.     Yes Historical Provider, MD  fexofenadine (ALLEGRA) 180 MG tablet Take 180 mg by mouth every morning.   Yes Historical Provider, MD  glipiZIDE (GLUCOTROL) 10 MG tablet Take 10 mg by mouth 2 (two) times daily before a meal.     Yes Historical Provider, MD  iron polysaccharides (NIFEREX) 150 MG capsule Take 1 capsule (150 mg total) by mouth daily. 07/09/13  Yes Pixie Casino, MD  levothyroxine (SYNTHROID, LEVOTHROID) 150 MCG tablet Take 150 mcg by mouth daily before breakfast.    Yes Historical Provider, MD  linagliptin (TRADJENTA) 5 MG TABS tablet Take 5 mg by mouth every morning.    Yes Historical Provider, MD  metolazone (ZAROXOLYN) 5 MG tablet Take 1 tablet (5 mg total) by mouth daily. Take one daily before torsemide. 08/09/13  Yes Cecilie Kicks, NP  oxyCODONE-acetaminophen (PERCOCET/ROXICET) 5-325 MG per tablet Take 1 tablet by mouth 2 (two) times daily. May take 1 additional dose if needed for pain   Yes Historical Provider, MD  OXYGEN-HELIUM IN Inhale 4 L into the lungs continuous.   Yes Historical Provider, MD  pantoprazole (PROTONIX) 40 MG tablet Take 1 tablet (40 mg total) by mouth 2 (two) times daily. 06/26/13  Yes Luke K Kilroy, PA-C  polyethylene glycol (MIRALAX / GLYCOLAX) packet Take 17 g by mouth daily.   Yes Historical Provider, MD  potassium chloride SA (K-DUR,KLOR-CON) 20 MEQ tablet Take 40 mEq by mouth daily.   Yes Historical  Provider, MD  pravastatin (PRAVACHOL) 20 MG tablet Take 20 mg by mouth daily.   Yes Historical Provider, MD  torsemide (DEMADEX) 20 MG tablet Take 40 mg by mouth daily. 08/09/13  Yes Cecilie Kicks, NP  Travoprost, BAK Free, (TRAVATAN) 0.004 % SOLN ophthalmic solution Place 1 drop into both eyes at bedtime.   Yes Historical Provider, MD    Allergies:   Allergies  Allergen Reactions  . Aspirin Other (See Comments)    GI bleed/ PUD  . Darvocet [Propoxyphene N-Acetaminophen] Nausea And Vomiting  . Diphenhydramine Hcl Other (See Comments)    Glaucoma prevents patient from taking this medication   . Aloe Vera Rash  . Penicillins Swelling and Rash    Social History:  Wheelchair bound Lives at  Home with family   reports that she has never smoked. Her smokeless tobacco use includes Snuff. She reports that she does not drink alcohol or use illicit drugs.   Family History: family history includes Arthritis/Rheumatoid in her  child; Coronary artery disease in her brother; Diabetes in her brother, brother, mother, sister, sister, and another family member; Heart attack (age of onset: 61) in her father; Heart disease in her sister; Heart disease (age of onset: 47) in her mother; Hyperlipidemia in her child; Hypertension in her child, child, child, child, and sister; Lymphoma in her daughter; Sleep apnea in her child; Stroke in her brother.    Physical Exam: Patient Vitals for the past 24 hrs:  BP Temp Temp src Pulse Resp SpO2 Weight  09/06/13 2230 108/60 mmHg - - 97 22 94 % -  09/06/13 2215 112/60 mmHg - - 94 22 95 % -  09/06/13 2115 104/69 mmHg - - 100 18 92 % -  09/06/13 2100 121/59 mmHg - - 100 20 94 % -  09/06/13 2015 114/57 mmHg - - 101 20 90 % -  09/06/13 2000 106/60 mmHg - - 100 18 89 % -  09/06/13 1826 - - - - - - 101.662 kg (224 lb 2 oz)  09/06/13 1825 - - - - - - 97.07 kg (214 lb)  09/06/13 1821 111/56 mmHg 97.7 F (36.5 C) Oral 104 26 92 % -    1. General:  in No Acute  distress 2. Psychological: Alert and  Oriented 3. Head/ENT:   Moist   Mucous Membranes                          Head Non traumatic, neck supple                          Normal  Dentition 4. SKIN: normal   Skin turgor,  Skin clean Dry and intact no rash 5. Heart: Regular rate and rhythm no Murmur, Rub or gallop 6. Lungs:   No wheezes extensive crackles  At the bases 7. Abdomen: Soft, non-tender, Non distended, obese 8. Lower extremities: no clubbing, cyanosis, 2+ edema 9. Neurologically Grossly intact, moving all 4 extremities equally 10. MSK: Normal range of motion  body mass index is 42.37 kg/(m^2).   Labs on Admission:   Recent Labs  09/06/13 1825  NA 139  K 3.4*  CL 93*  CO2 30  GLUCOSE 114*  BUN 30*  CREATININE 1.30*  CALCIUM 9.6    Recent Labs  09/06/13 1825  AST 22  ALT 10  ALKPHOS 141*  BILITOT 0.5  PROT 7.0  ALBUMIN 4.1   No results found for this basename: LIPASE, AMYLASE,  in the last 72 hours  Recent Labs  09/06/13 1825  WBC 8.9  NEUTROABS 6.1  HGB 10.2*  HCT 34.1*  MCV 65.3*  PLT 160    Recent Labs  09/06/13 1912  TROPONINI <0.30   No results found for this basename: TSH, T4TOTAL, FREET3, T3FREE, THYROIDAB,  in the last 72 hours No results found for this basename: VITAMINB12, FOLATE, FERRITIN, TIBC, IRON, RETICCTPCT,  in the last 72 hours Lab Results  Component Value Date   HGBA1C 7.0* 04/04/2013    The CrCl is unknown because both a height and weight (above a minimum accepted value) are required for this calculation. ABG    Component Value Date/Time   PHART 7.512* 09/06/2013 1938   HCO3 29.0* 09/06/2013 1938   TCO2 30 09/06/2013 1938   ACIDBASEDEF 2.0 03/08/2013 1923   O2SAT 88.0 09/06/2013 1938     Lab Results  Component Value Date   DDIMER 1.16* 10/15/2012  Other results:  I have pearsonaly reviewed this: ECG REPORT  Rate: 108  Rhythm: ST with PAC ST&T Change: no ischemia   Cultures:    Component Value Date/Time    SDES BLOOD HAND RIGHT 01/11/2013 2238   SPECREQUEST BOTTLES DRAWN AEROBIC ONLY 5CC 01/11/2013 2238   CULT NO GROWTH 5 DAYS 01/11/2013 2238   REPTSTATUS 01/18/2013 FINAL 01/11/2013 2238       Radiological Exams on Admission: Dg Chest Portable 1 View  09/06/2013   CLINICAL DATA:  Short of breath for 1 week. Worsening shortness of breath.  EXAM: PORTABLE CHEST - 1 VIEW  COMPARISON:  DG CHEST 1V PORT dated 04/04/2013; DG CHEST 1V PORT dated 03/15/2013; DG CHEST 1V PORT dated 03/13/2013; DG CHEST 2 VIEW dated 03/08/2013  FINDINGS: Patient is rotated to the right. Oxygen tubing projects over the chest. The cardiopericardial silhouette appears enlarged. Pulmonary vascular congestion is present. There is a new right pleural effusion and right basilar atelectasis and partial collapse. Underlying airspace disease cannot be excluded. Mild left basilar atelectasis is present. Lucency is present in the retrocardiac area which is favored to be projectional although a hiatal hernia could produce this appearance as well.  IMPRESSION: New right pleural effusion and basilar collapse/consolidation. The appearance is similar to the prior exam 03/13/2013 and failure with asymmetric effusion and edema is favored. Pneumonia or aspiration or in the differential considerations.   Electronically Signed   By: Dereck Ligas M.D.   On: 09/06/2013 19:48    Chart has been reviewed  Assessment/Plan 79 year old female with history of diastolic heart failure, severe tricuspid regurgitant and pulmonary hypertension presents with fluid overload and shortness of breath  Present on Admission:  . Acute diastolic HF (heart failure), NYHA class 4 - will admit to telemetry cycle cardiac markers repeat echo gram. Diuresis with Lasix 40 twice a day IV, follow creatinine, obtain cardiology consult  . DM2 (diabetes mellitus, type 2) - sliding scale insulin continue home medications  . CAP (community acquired pneumonia) - clinically patient does  not present with symptoms consistent or pneumonia but to fluid overload. Chest x-ray worrisome for consolidation. For now will cover Levaquin would obtain repeat imaging. Of note patient had similar chest x-ray in July with improvement showing on her chest x-ray in August  Hx of PUD - currently asymptomatic   Prophylaxis Lovenox Protonix  CODE STATUS: DNI only  Other plan as per orders.  I have spent a total of 65 min on this admission - time taken to discuss patient with cardiology  [place] 09/06/2013, 11:04 PM

## 2013-09-06 NOTE — ED Notes (Addendum)
Pt. Up to use bedside commode. O2 sat dropped into the 70s. Pt. SOB. Assisted patient back to bed, monitored patient oxygen. O2 sat raised back up to the 80s.

## 2013-09-06 NOTE — Telephone Encounter (Signed)
[name], [name] w/ [facility], called.  Stated pt w/ worsened SOB.  O2 sat 83% on 4L.  Weight stable aroun 212-214 lbs.  One day weight was up to 219 lbs, but came back down w/ diuretic.  Trace peripheral edema.  Lungs clear.  Pulse 100.  BP 100/70.  Pt has not had CP, dizziness or passing out.  Stated pt is extremely SOB and can barely walk across the room.  Pt taking: Torsemide 20 mg one BID, Metolazone 5 mg prn weight gain.  Alisa advised pt should be seen in ER now.  Agreed and will inform pt.  Message forwarded to Dr. Debara Pickett.

## 2013-09-07 DIAGNOSIS — I509 Heart failure, unspecified: Secondary | ICD-10-CM

## 2013-09-07 DIAGNOSIS — K279 Peptic ulcer, site unspecified, unspecified as acute or chronic, without hemorrhage or perforation: Secondary | ICD-10-CM

## 2013-09-07 LAB — CBC WITH DIFFERENTIAL/PLATELET
BASOS ABS: 0.1 10*3/uL (ref 0.0–0.1)
Basophils Relative: 1 % (ref 0–1)
EOS ABS: 0.1 10*3/uL (ref 0.0–0.7)
Eosinophils Relative: 1 % (ref 0–5)
HCT: 33 % — ABNORMAL LOW (ref 36.0–46.0)
HEMOGLOBIN: 9.6 g/dL — AB (ref 12.0–15.0)
LYMPHS PCT: 10 % — AB (ref 12–46)
Lymphs Abs: 0.7 10*3/uL (ref 0.7–4.0)
MCH: 19.5 pg — AB (ref 26.0–34.0)
MCHC: 29.1 g/dL — AB (ref 30.0–36.0)
MCV: 67.1 fL — ABNORMAL LOW (ref 78.0–100.0)
MONO ABS: 0.6 10*3/uL (ref 0.1–1.0)
Monocytes Relative: 9 % (ref 3–12)
Neutro Abs: 5.5 10*3/uL (ref 1.7–7.7)
Neutrophils Relative %: 79 % — ABNORMAL HIGH (ref 43–77)
PLATELETS: 126 10*3/uL — AB (ref 150–400)
RBC: 4.92 MIL/uL (ref 3.87–5.11)
RDW: 18.4 % — ABNORMAL HIGH (ref 11.5–15.5)
WBC: 7 10*3/uL (ref 4.0–10.5)

## 2013-09-07 LAB — GLUCOSE, CAPILLARY
GLUCOSE-CAPILLARY: 137 mg/dL — AB (ref 70–99)
Glucose-Capillary: 160 mg/dL — ABNORMAL HIGH (ref 70–99)
Glucose-Capillary: 118 mg/dL — ABNORMAL HIGH (ref 70–99)
Glucose-Capillary: 221 mg/dL — ABNORMAL HIGH (ref 70–99)
Glucose-Capillary: 93 mg/dL (ref 70–99)

## 2013-09-07 LAB — BASIC METABOLIC PANEL
BUN: 28 mg/dL — ABNORMAL HIGH (ref 6–23)
CALCIUM: 9.2 mg/dL (ref 8.4–10.5)
CO2: 30 mEq/L (ref 19–32)
Chloride: 94 mEq/L — ABNORMAL LOW (ref 96–112)
Creatinine, Ser: 1.18 mg/dL — ABNORMAL HIGH (ref 0.50–1.10)
GFR, EST AFRICAN AMERICAN: 51 mL/min — AB (ref >90)
GFR, EST NON AFRICAN AMERICAN: 44 mL/min — AB (ref >90)
Glucose, Bld: 151 mg/dL — ABNORMAL HIGH (ref 70–99)
POTASSIUM: 3.3 meq/L — AB (ref 3.7–5.3)
SODIUM: 138 meq/L (ref 137–147)

## 2013-09-07 LAB — LEGIONELLA ANTIGEN, URINE: LEGIONELLA ANTIGEN, URINE: NEGATIVE

## 2013-09-07 LAB — TROPONIN I: Troponin I: <0.3 ng/mL (ref <0.30)

## 2013-09-07 LAB — TSH: TSH: 0.389 u[IU]/mL (ref 0.350–4.500)

## 2013-09-07 LAB — HEMOGLOBIN A1C
Hgb A1c MFr Bld: 6.8 % — ABNORMAL HIGH (ref <5.7)
Mean Plasma Glucose: 148 mg/dL — ABNORMAL HIGH (ref <117)

## 2013-09-07 LAB — STREP PNEUMONIAE URINARY ANTIGEN: Strep Pneumo Urinary Antigen: NEGATIVE

## 2013-09-07 MED ORDER — LORATADINE 10 MG PO TABS
10.0000 mg | ORAL_TABLET | Freq: Every day | ORAL | Status: DC
  Administered 2013-09-07 – 2013-09-15 (×9): 10 mg via ORAL
  Filled 2013-09-07 (×10): qty 1

## 2013-09-07 MED ORDER — ENOXAPARIN SODIUM 40 MG/0.4ML ~~LOC~~ SOLN
40.0000 mg | SUBCUTANEOUS | Status: DC
  Administered 2013-09-09 – 2013-09-14 (×6): 40 mg via SUBCUTANEOUS
  Filled 2013-09-07 (×9): qty 0.4

## 2013-09-07 MED ORDER — SODIUM CHLORIDE 0.9 % IJ SOLN
3.0000 mL | Freq: Two times a day (BID) | INTRAMUSCULAR | Status: DC
  Administered 2013-09-07 – 2013-09-14 (×9): 3 mL via INTRAVENOUS

## 2013-09-07 MED ORDER — LEVOFLOXACIN IN D5W 750 MG/150ML IV SOLN
750.0000 mg | Freq: Every day | INTRAVENOUS | Status: DC
  Administered 2013-09-07: 750 mg via INTRAVENOUS

## 2013-09-07 MED ORDER — POLYSACCHARIDE IRON COMPLEX 150 MG PO CAPS
150.0000 mg | ORAL_CAPSULE | Freq: Every day | ORAL | Status: DC
  Administered 2013-09-07 – 2013-09-15 (×9): 150 mg via ORAL
  Filled 2013-09-07 (×9): qty 1

## 2013-09-07 MED ORDER — INSULIN ASPART 100 UNIT/ML ~~LOC~~ SOLN
0.0000 [IU] | Freq: Three times a day (TID) | SUBCUTANEOUS | Status: DC
  Administered 2013-09-07: 3 [IU] via SUBCUTANEOUS
  Administered 2013-09-07: 5 [IU] via SUBCUTANEOUS
  Administered 2013-09-08 – 2013-09-09 (×4): 3 [IU] via SUBCUTANEOUS
  Administered 2013-09-10: 2 [IU] via SUBCUTANEOUS
  Administered 2013-09-10 – 2013-09-13 (×4): 3 [IU] via SUBCUTANEOUS
  Administered 2013-09-14: 5 [IU] via SUBCUTANEOUS
  Administered 2013-09-14: 3 [IU] via SUBCUTANEOUS
  Administered 2013-09-15: 5 [IU] via SUBCUTANEOUS
  Administered 2013-09-15: 2 [IU] via SUBCUTANEOUS

## 2013-09-07 MED ORDER — ALPRAZOLAM 0.25 MG PO TABS
0.5000 mg | ORAL_TABLET | Freq: Two times a day (BID) | ORAL | Status: DC
  Administered 2013-09-07 – 2013-09-15 (×18): 0.5 mg via ORAL
  Filled 2013-09-07 (×18): qty 2

## 2013-09-07 MED ORDER — DOCUSATE SODIUM 100 MG PO CAPS
100.0000 mg | ORAL_CAPSULE | Freq: Two times a day (BID) | ORAL | Status: DC
  Administered 2013-09-07 – 2013-09-14 (×14): 100 mg via ORAL
  Filled 2013-09-07 (×21): qty 1

## 2013-09-07 MED ORDER — SODIUM CHLORIDE 0.9 % IJ SOLN: 3.0000 mL | INTRAMUSCULAR | Status: DC | PRN

## 2013-09-07 MED ORDER — SODIUM CHLORIDE 0.9 % IV SOLN: 250.0000 mL | INTRAVENOUS | Status: DC | PRN

## 2013-09-07 MED ORDER — LEVOFLOXACIN IN D5W 750 MG/150ML IV SOLN
750.0000 mg | INTRAVENOUS | Status: DC
  Administered 2013-09-09: 750 mg via INTRAVENOUS
  Filled 2013-09-07: qty 150

## 2013-09-07 MED ORDER — PANTOPRAZOLE SODIUM 40 MG PO TBEC
40.0000 mg | DELAYED_RELEASE_TABLET | Freq: Two times a day (BID) | ORAL | Status: DC
  Administered 2013-09-07 – 2013-09-15 (×17): 40 mg via ORAL
  Filled 2013-09-07 (×16): qty 1

## 2013-09-07 MED ORDER — LEVOTHYROXINE SODIUM 150 MCG PO TABS
150.0000 ug | ORAL_TABLET | Freq: Every day | ORAL | Status: DC
  Administered 2013-09-07 – 2013-09-15 (×9): 150 ug via ORAL
  Filled 2013-09-07 (×10): qty 1

## 2013-09-07 MED ORDER — SODIUM CHLORIDE 0.9 % IJ SOLN
3.0000 mL | Freq: Two times a day (BID) | INTRAMUSCULAR | Status: DC
  Administered 2013-09-07: 3 mL via INTRAVENOUS

## 2013-09-07 MED ORDER — ACETAMINOPHEN 325 MG PO TABS
650.0000 mg | ORAL_TABLET | ORAL | Status: DC | PRN
  Administered 2013-09-07: 650 mg via ORAL
  Filled 2013-09-07: qty 2

## 2013-09-07 MED ORDER — INFLUENZA VAC SPLIT QUAD 0.5 ML IM SUSP
0.5000 mL | INTRAMUSCULAR | Status: AC
  Administered 2013-09-08: 0.5 mL via INTRAMUSCULAR
  Filled 2013-09-07: qty 0.5

## 2013-09-07 MED ORDER — LATANOPROST 0.005 % OP SOLN
1.0000 [drp] | Freq: Every day | OPHTHALMIC | Status: DC
  Administered 2013-09-12 – 2013-09-14 (×3): 1 [drp] via OPHTHALMIC
  Filled 2013-09-07 (×3): qty 2.5

## 2013-09-07 MED ORDER — OXYCODONE-ACETAMINOPHEN 5-325 MG PO TABS
1.0000 | ORAL_TABLET | Freq: Two times a day (BID) | ORAL | Status: DC
  Administered 2013-09-07 – 2013-09-15 (×17): 1 via ORAL
  Filled 2013-09-07 (×17): qty 1

## 2013-09-07 MED ORDER — INSULIN ASPART 100 UNIT/ML ~~LOC~~ SOLN: 0.0000 [IU] | Freq: Every day | SUBCUTANEOUS | Status: DC

## 2013-09-07 MED ORDER — POLYETHYLENE GLYCOL 3350 17 G PO PACK
17.0000 g | PACK | Freq: Every day | ORAL | Status: DC
  Administered 2013-09-09 – 2013-09-14 (×4): 17 g via ORAL
  Filled 2013-09-07 (×9): qty 1

## 2013-09-07 MED ORDER — POTASSIUM CHLORIDE CRYS ER 20 MEQ PO TBCR
40.0000 meq | EXTENDED_RELEASE_TABLET | Freq: Every day | ORAL | Status: DC
  Administered 2013-09-07 – 2013-09-08 (×2): 40 meq via ORAL
  Filled 2013-09-07 (×2): qty 2

## 2013-09-07 MED ORDER — BISACODYL 10 MG RE SUPP: 10.0000 mg | RECTAL | Status: DC | PRN

## 2013-09-07 MED ORDER — LEVOFLOXACIN IN D5W 750 MG/150ML IV SOLN
750.0000 mg | INTRAVENOUS | Status: DC
  Filled 2013-09-07: qty 150

## 2013-09-07 MED ORDER — EZETIMIBE 10 MG PO TABS
10.0000 mg | ORAL_TABLET | Freq: Every day | ORAL | Status: DC
  Administered 2013-09-07 – 2013-09-14 (×8): 10 mg via ORAL
  Filled 2013-09-07 (×9): qty 1

## 2013-09-07 MED ORDER — ALPRAZOLAM 0.25 MG PO TABS: 0.5000 mg | ORAL_TABLET | Freq: Two times a day (BID) | ORAL | Status: DC

## 2013-09-07 MED ORDER — GLIPIZIDE 10 MG PO TABS
10.0000 mg | ORAL_TABLET | Freq: Two times a day (BID) | ORAL | Status: DC
  Administered 2013-09-07 – 2013-09-15 (×17): 10 mg via ORAL
  Filled 2013-09-07 (×22): qty 1

## 2013-09-07 MED ORDER — SIMVASTATIN 10 MG PO TABS
10.0000 mg | ORAL_TABLET | Freq: Every day | ORAL | Status: DC
  Administered 2013-09-07 – 2013-09-14 (×8): 10 mg via ORAL
  Filled 2013-09-07 (×9): qty 1

## 2013-09-07 MED ORDER — FUROSEMIDE 10 MG/ML IJ SOLN
40.0000 mg | Freq: Two times a day (BID) | INTRAMUSCULAR | Status: DC
  Administered 2013-09-07 – 2013-09-09 (×5): 40 mg via INTRAVENOUS
  Filled 2013-09-07 (×8): qty 4

## 2013-09-07 MED ORDER — ONDANSETRON HCL 4 MG/2ML IJ SOLN
4.0000 mg | Freq: Four times a day (QID) | INTRAMUSCULAR | Status: DC | PRN
Start: 2013-09-07 — End: 2013-09-15

## 2013-09-07 MED ORDER — BRIMONIDINE TARTRATE 0.15 % OP SOLN: 1.0000 [drp] | Freq: Every day | OPHTHALMIC | Status: DC

## 2013-09-07 MED ORDER — BRIMONIDINE TARTRATE 0.2 % OP SOLN
1.0000 [drp] | Freq: Every day | OPHTHALMIC | Status: DC
  Administered 2013-09-07 – 2013-09-15 (×5): 1 [drp] via OPHTHALMIC
  Filled 2013-09-07 (×2): qty 5

## 2013-09-07 MED ORDER — METOLAZONE 5 MG PO TABS
5.0000 mg | ORAL_TABLET | Freq: Every day | ORAL | Status: DC
  Administered 2013-09-07 – 2013-09-08 (×2): 5 mg via ORAL
  Filled 2013-09-07 (×2): qty 1

## 2013-09-07 NOTE — Consult Note (Signed)
Cardiology Consultation Note  Patient ID: [name], MRN: [id number], DOB/AGE: Dec 19, 1934 79 y.o. Admit date: 09/06/2013   Date of Consult: 09/07/2013 Primary Physician: Glo Herring., MD Primary Cardiologist:   Chief Complaint: SOB  Reason for Consult: HF   HPI: 79 y.o. morbidly obese female with a history of right heart failure, severe pulmonary hypertension and obstructive sleep apnea, intolerant to CPAP. Wears oxygen 24/7 @ 4 litres  She has had multiple admissions this year for acute on chronic diastolic HF. Her last echo was on 072014, which demonstrated normal  EF of 65-70% and mod LVH  , Pt states that for the past 2-3 weeks she has been getting progressively more SOB , with DOE with minimal activity . She has chronic 2 pillow orthopnea, no PND , states LE swelling mildly worse. At home she requires 4 litres of oxygen today she is now at 6 litres. weight fluctuate 95-100 kg and she is 101 kg today .  Pt denies any chest painSyncope ,claudcation , focal weakness, or bleeding diathesis .  Reports compliance with medications.   Past Medical History  Diagnosis Date  . Diabetes mellitus   . Hypertension   . Shingles   . GERD (gastroesophageal reflux disease)   . Abnormal heart rhythms   . Thyroid disease     hypthyroidism  . Pulmonary hypertension, moderate to severe     No evidence of CAD on Cath 2011  . Obesity, morbid (more than 100 lbs over ideal weight or BMI > 40)   . Chronic respiratory failure with hypoxia 01/23/2012    Related to OHS  . Glaucoma   . CHF (congestive heart failure)     Echo at [facility] 10/14/2012 see notes  . Acute on chronic renal failure   . COPD (chronic obstructive pulmonary disease)       Most Recent Cardiac Studies:  Echio 02/2013 Left ventricle: The cavity size was normal. Wall thickness was increased in a pattern of moderate LVH. Systolic function was normal. The estimated ejection fraction was in the range of 60% to 65%. - Right  ventricle: The cavity size was moderately dilated. The moderator band was prominent. Systolic function is reduced. - Atrial septum: Inadequate microbubble contrast to r/o interatrial shunt. There are bubbles seen in the left ventricle within a few cycles,suggesting some degree of extracardiac shunt. The RV was not completely opacified, probably due to severe TR and flow reversal. A patent foramen ovale cannot be excluded. - Tricuspid valve: Moderate to severeregurgitation. Dilated annulus. - Pulmonary arteries: PA peak pressure: 70mm Hg (S). Severe pulmonary hypertension. - Inferior vena cava: The vessel was dilated; the respirophasic diameter changes were blunted (< 50%); findings are consistent with elevated central venous pressure. - Pericardium, extracardiac: Small, posterior pericardial effusion.    Surgical History:  Past Surgical History  Procedure Laterality Date  . Tubal ligation    . Ovary removed    . Knee arthroscopy      left  . Cardiovascular stress test  09/15/2006    EF 81%; normal perfusion all regions; LV normal in size; no scintigraphic evidence of inducible myocardial ischemia  . Cardiac catheterization  02/21/2010    EF 65%; PA pressure 86/21 with mean of 44; normal coronary arteries, primary pumonary hypertension; no significant wall motion abnormalities  . Esophagogastroduodenoscopy N/A 06/25/2013    Procedure: ESOPHAGOGASTRODUODENOSCOPY (EGD);  Surgeon: Cleotis Nipper, MD;  Location: [facility] ENDOSCOPY;  Service: Endoscopy;  Laterality: N/A;  pediatric upper endoscope; no sedation  Home Meds: Prior to Admission medications   Medication Sig Start Date End Date Taking? Authorizing Provider  ALPRAZolam Duanne Moron) 0.5 MG tablet Take 0.5 mg by mouth 2 (two) times daily. May take up to 4 times daily as prescribed   Yes Historical Provider, MD  bisacodyl (DULCOLAX) 10 MG suppository Place 10 mg rectally as needed for constipation.   Yes Historical Provider, MD    brimonidine (ALPHAGAN P) 0.1 % SOLN Place 1 drop into both eyes daily.   Yes Historical Provider, MD  docusate sodium (STOOL SOFTENER) 100 MG capsule Take 100 mg by mouth 2 (two) times daily.   Yes Historical Provider, MD  ezetimibe (ZETIA) 10 MG tablet Take 10 mg by mouth at bedtime.     Yes Historical Provider, MD  fexofenadine (ALLEGRA) 180 MG tablet Take 180 mg by mouth every morning.   Yes Historical Provider, MD  glipiZIDE (GLUCOTROL) 10 MG tablet Take 10 mg by mouth 2 (two) times daily before a meal.     Yes Historical Provider, MD  iron polysaccharides (NIFEREX) 150 MG capsule Take 1 capsule (150 mg total) by mouth daily. 07/09/13  Yes Pixie Casino, MD  levothyroxine (SYNTHROID, LEVOTHROID) 150 MCG tablet Take 150 mcg by mouth daily before breakfast.    Yes Historical Provider, MD  linagliptin (TRADJENTA) 5 MG TABS tablet Take 5 mg by mouth every morning.    Yes Historical Provider, MD  metolazone (ZAROXOLYN) 5 MG tablet Take 1 tablet (5 mg total) by mouth daily. Take one daily before torsemide. 08/09/13  Yes Cecilie Kicks, NP  oxyCODONE-acetaminophen (PERCOCET/ROXICET) 5-325 MG per tablet Take 1 tablet by mouth 2 (two) times daily. May take 1 additional dose if needed for pain   Yes Historical Provider, MD  OXYGEN-HELIUM IN Inhale 4 L into the lungs continuous.   Yes Historical Provider, MD  pantoprazole (PROTONIX) 40 MG tablet Take 1 tablet (40 mg total) by mouth 2 (two) times daily. 06/26/13  Yes Luke K Kilroy, PA-C  polyethylene glycol (MIRALAX / GLYCOLAX) packet Take 17 g by mouth daily.   Yes Historical Provider, MD  potassium chloride SA (K-DUR,KLOR-CON) 20 MEQ tablet Take 40 mEq by mouth daily.   Yes Historical Provider, MD  pravastatin (PRAVACHOL) 20 MG tablet Take 20 mg by mouth daily.   Yes Historical Provider, MD  torsemide (DEMADEX) 20 MG tablet Take 40 mg by mouth daily. 08/09/13  Yes Cecilie Kicks, NP  Travoprost, BAK Free, (TRAVATAN) 0.004 % SOLN ophthalmic solution Place 1  drop into both eyes at bedtime.   Yes Historical Provider, MD    Inpatient Medications:  . ipratropium  0.5 mg Nebulization Once      Allergies:  Allergies  Allergen Reactions  . Aspirin Other (See Comments)    GI bleed/ PUD  . Darvocet [Propoxyphene N-Acetaminophen] Nausea And Vomiting  . Diphenhydramine Hcl Other (See Comments)    Glaucoma prevents patient from taking this medication   . Aloe Vera Rash  . Penicillins Swelling and Rash    History   Social History  . Marital Status: Widowed    Spouse Name: N/A    Number of Children: N/A  . Years of Education: N/A   Occupational History  . Not on file.   Social History Main Topics  . Smoking status: Never Smoker   . Smokeless tobacco: Current User    Types: Snuff  . Alcohol Use: No  . Drug Use: No  . Sexual Activity: Not on file   Other  Topics Concern  . Not on file   Social History Narrative   8 th grade education   Previous job: farmer    Lives with brother   Denies cigarettes, etOH, drugs. Uses snuff since age 80      Family History  Problem Relation Age of Onset  . Heart attack Father 68  . Heart disease Mother 49  . Diabetes Mother   . Lymphoma Daughter   . Diabetes      siblings, son  . Coronary artery disease Brother   . Diabetes Brother   . Stroke Brother   . Diabetes Brother   . Diabetes Sister   . Hypertension Sister   . Diabetes Sister   . Heart disease Sister   . Hypertension Child   . Sleep apnea Child   . Hypertension Child   . Hypertension Child   . Arthritis/Rheumatoid Child   . Hyperlipidemia Child   . Hypertension Child      Review of Systems:per HPI .  Labs:  Recent Labs  09/06/13 1912  TROPONINI <0.30   Lab Results  Component Value Date   WBC 8.9 09/06/2013   HGB 10.2* 09/06/2013   HCT 34.1* 09/06/2013   MCV 65.3* 09/06/2013   PLT 160 09/06/2013    Recent Labs Lab 09/06/13 1825  NA 139  K 3.4*  CL 93*  CO2 30  BUN 30*  CREATININE 1.30*  CALCIUM 9.6  PROT  7.0  BILITOT 0.5  ALKPHOS 141*  ALT 10  AST 22  GLUCOSE 114*   No results found for this basename: CHOL, HDL, LDLCALC, TRIG   Lab Results  Component Value Date   DDIMER 1.16* 10/15/2012    Radiology/Studies:  Dg Chest Portable 1 View  09/06/2013   CLINICAL DATA:  Short of breath for 1 week. Worsening shortness of breath.  EXAM: PORTABLE CHEST - 1 VIEW  COMPARISON:  DG CHEST 1V PORT dated 04/04/2013; DG CHEST 1V PORT dated 03/15/2013; DG CHEST 1V PORT dated 03/13/2013; DG CHEST 2 VIEW dated 03/08/2013  FINDINGS: Patient is rotated to the right. Oxygen tubing projects over the chest. The cardiopericardial silhouette appears enlarged. Pulmonary vascular congestion is present. There is a new right pleural effusion and right basilar atelectasis and partial collapse. Underlying airspace disease cannot be excluded. Mild left basilar atelectasis is present. Lucency is present in the retrocardiac area which is favored to be projectional although a hiatal hernia could produce this appearance as well.  IMPRESSION: New right pleural effusion and basilar collapse/consolidation. The appearance is similar to the prior exam 03/13/2013 and failure with asymmetric effusion and edema is favored. Pneumonia or aspiration or in the differential considerations.   Electronically Signed   By: Dereck Ligas M.D.   On: 09/06/2013 19:48    EKG:09/06/2013  NSR, incomplete RBBB, right axis deviation   Physical Exam: Blood pressure 108/60, pulse 97, temperature 97.7 F (36.5 C), temperature source Oral, resp. rate 22, weight 101.662 kg (224 lb 2 oz), SpO2 94.00%. General: Well developed, well nourished, in no acute distress. Head: Normocephalic, atraumatic, sclera non-icteric, no xanthomas, nares are without discharge.  Neck: Negative for carotid bruits. JVD mildly elevated  Lungs: Clear bilaterally to auscultation without wheezes, rales, or rhonchi. Breathing is unlabored. Heart: RRR with S1 S2. No murmurs, rubs, or  gallops appreciated. Abdomen: Soft, non-tender, non-distended with normoactive bowel sounds. No hepatomegaly. No rebound/guarding. No obvious abdominal masses. Msk:  Strength and tone appear normal for age. Extremities: No clubbing or cyanosis.  2+ pitting edema.  Distal pedal pulses are 2+ and equal bilaterally. Neuro: Alert and oriented X 3. No facial asymmetry. No focal deficit. Moves all extremities spontaneously. Psych:  Responds to questions appropriately with a normal affect.     Assessment and Plan:  HF pEF - acute on chronic with ass severe Pulm HTN and RV failure  HTN  DM   Plan  Assess response to IV lasix with Uop, Cr, and weight. Will likley eed escalating dose Cont metalazone , replete K >4.0 Needs strict BP control  Cont oral hypogleycemic agent for now.   Cory Roughen, A M.D  09/07/2013, 12:07 AM

## 2013-09-07 NOTE — Telephone Encounter (Signed)
I agree .[facility] is appropriate.  Dr. Debara Pickett

## 2013-09-07 NOTE — Progress Notes (Signed)
[facility] PROGRESS NOTE  ITZAMAR TRAYNOR UTM:[id number] DOB: [birth year] DOA: 09/06/2013 PCP: Glo Herring., MD  Assessment/Plan: 1. Acute on chronic CHF with preserved LVEF: Cardiology following. Respiratory status improved. Continue with lasix and zaroxolyn. Need daily weights.  2. Hypertension: actually low currently. Continue with lasix and zaroxolyn for diuresis. No other antihypertensives.   3. DM2: blood sugars well controled. Continue with glipizide, stop standing insulin with meals. ssi sensitive. 4. CAP: abnormal finding on xray. Pneumonia vs aspiration with similar finding on 03/13/14.  No fever or white count. Treated empirically for CAP with levaquin. 5. PUD: PPI 6. Pulmonary hypertension 7. Abnormal findings in CXR: suggest aspiration. When asked she reports that she does cough when eating. ST consult for swallow eval.  Code Status: partial DNI Family Communication: spoke with patient and daughter Disposition Plan: inpatient   Consultants:  cardiology  Procedures:  none  Antibiotics: levaquin 1/13>>  HPI/Subjective: Feeling better. Breathing much easier.  Objective: Filed Vitals:   09/07/13 1434  BP: 107/53  Pulse: 103  Temp: 97.6 F (36.4 C)  Resp: 20    Intake/Output Summary (Last 24 hours) at 09/07/13 1554 Last data filed at 09/07/13 0835  Gross per 24 hour  Intake    440 ml  Output    950 ml  Net   -510 ml   Filed Weights   09/06/13 1825 09/06/13 1826 09/07/13 0122  Weight: 97.07 kg (214 lb) 101.662 kg (224 lb 2 oz) 101 kg (222 lb 10.6 oz)    Exam:   General:  NAD, resting in chair  Cardiovascular: RRR no mrg  Respiratory: bibasilar crackles to mid lung fields, good air movement  Abdomen: BS+ soft nt  Musculoskeletal: 1+ edema bilaterally  Data Reviewed: Basic Metabolic Panel:  Recent Labs Lab 09/06/13 1825 09/07/13 0155  NA 139 138  K 3.4* 3.3*  CL 93* 94*  CO2 30 30  GLUCOSE 114* 151*  BUN 30* 28*   CREATININE 1.30* 1.18*  CALCIUM 9.6 9.2   Liver Function Tests:  Recent Labs Lab 09/06/13 1825  AST 22  ALT 10  ALKPHOS 141*  BILITOT 0.5  PROT 7.0  ALBUMIN 4.1   No results found for this basename: LIPASE, AMYLASE,  in the last 168 hours No results found for this basename: AMMONIA,  in the last 168 hours CBC:  Recent Labs Lab 09/06/13 1825 09/07/13 0155  WBC 8.9 7.0  NEUTROABS 6.1 5.5  HGB 10.2* 9.6*  HCT 34.1* 33.0*  MCV 65.3* 67.1*  PLT 160 126*   Cardiac Enzymes:  Recent Labs Lab 09/06/13 1912 09/07/13 0155 09/07/13 0747 09/07/13 1305  TROPONINI <0.30 <0.30 <0.30 <0.30   BNP (last 3 results)  Recent Labs  04/04/13 1618 06/24/13 1825 09/06/13 1825  PROBNP 2372.0* 2425.0* 2771.0*   CBG:  Recent Labs Lab 09/07/13 0141 09/07/13 0650 09/07/13 1116  GLUCAP 137* 160* 118*    No results found for this or any previous visit (from the past 240 hour(s)).   Studies: Dg Chest Portable 1 View  09/06/2013   CLINICAL DATA:  Short of breath for 1 week. Worsening shortness of breath.  EXAM: PORTABLE CHEST - 1 VIEW  COMPARISON:  DG CHEST 1V PORT dated 04/04/2013; DG CHEST 1V PORT dated 03/15/2013; DG CHEST 1V PORT dated 03/13/2013; DG CHEST 2 VIEW dated 03/08/2013  FINDINGS: Patient is rotated to the right. Oxygen tubing projects over the chest. The cardiopericardial silhouette appears enlarged. Pulmonary vascular congestion is present. There is a new right  pleural effusion and right basilar atelectasis and partial collapse. Underlying airspace disease cannot be excluded. Mild left basilar atelectasis is present. Lucency is present in the retrocardiac area which is favored to be projectional although a hiatal hernia could produce this appearance as well.  IMPRESSION: New right pleural effusion and basilar collapse/consolidation. The appearance is similar to the prior exam 03/13/2013 and failure with asymmetric effusion and edema is favored. Pneumonia or aspiration or in  the differential considerations.   Electronically Signed   By: Dereck Ligas M.D.   On: 09/06/2013 19:48   Mm Digital Screening  09/07/2013   CLINICAL DATA:  Screening.  EXAM: DIGITAL SCREENING BILATERAL MAMMOGRAM WITH CAD  COMPARISON:  Previous exam(s).  ACR Breast Density Category b: There are scattered areas of fibroglandular density.  FINDINGS: There are no findings suspicious for malignancy. Images were processed with CAD.  IMPRESSION: No mammographic evidence of malignancy. A result letter of this screening mammogram will be mailed directly to the patient.  RECOMMENDATION: Screening mammogram in one year. (Code:[id number])  BI-RADS CATEGORY  2: Benign Finding(s)   Electronically Signed   By: Abelardo Diesel M.D.   On: 09/07/2013 09:27    Scheduled Meds: . ALPRAZolam  0.5 mg Oral BID  . brimonidine  1 drop Both Eyes Daily  . docusate sodium  100 mg Oral BID  . enoxaparin (LOVENOX) injection  40 mg Subcutaneous Q24H  . ezetimibe  10 mg Oral QHS  . furosemide  40 mg Intravenous BID  . glipiZIDE  10 mg Oral BID AC  . [START ON 09/08/2013] influenza vac split quadrivalent PF  0.5 mL Intramuscular Tomorrow-1000  . insulin aspart  0-15 Units Subcutaneous TID WC  . insulin aspart  0-5 Units Subcutaneous QHS  . ipratropium  0.5 mg Nebulization Once  . iron polysaccharides  150 mg Oral Daily  . [START ON 09/08/2013] latanoprost  1 drop Both Eyes QHS  . [START ON 09/09/2013] levofloxacin (LEVAQUIN) IV  750 mg Intravenous Q48H  . levothyroxine  150 mcg Oral QAC breakfast  . loratadine  10 mg Oral Daily  . metolazone  5 mg Oral Daily  . oxyCODONE-acetaminophen  1 tablet Oral BID  . pantoprazole  40 mg Oral BID  . polyethylene glycol  17 g Oral Daily  . potassium chloride SA  40 mEq Oral Daily  . simvastatin  10 mg Oral q1800  . sodium chloride  3 mL Intravenous Q12H  . sodium chloride  3 mL Intravenous Q12H   Continuous Infusions:   Principal Problem:   Right heart failure due to pulmonary  hypertension - With Acute on Chronic exaverbation Active Problems:   Chronic respiratory failure with hypoxia   DM2 (diabetes mellitus, type 2)   Unspecified hypothyroidism   Normal coronary arteries at cath 2011   Obesity, morbid (more than 100 lbs over ideal weight or BMI > 40)   Pulmonary hypertension, Severe by Echo 02/1713-pa pressure 74 mmHg   Diastolic CHF, chronic- EF 65-70% May 2014   Acute blood loss anemia- transfused 10/14- negative endoscopy   PUD (peptic ulcer disease)- ASA stopped   CAP (community acquired pneumonia)    Time spent: 25 minutes    [facility] Pager [phone]. If 7PM-7AM, please contact night-coverage at [email or web address], password [facility] 09/07/2013, 3:54 PM  LOS: 1 day

## 2013-09-07 NOTE — Progress Notes (Signed)
UR Completed.  Carolyn Sylvia Jane [phone] 09/07/2013  

## 2013-09-07 NOTE — Progress Notes (Signed)
ANTIBIOTIC CONSULT NOTE - INITIAL  Pharmacy Consult for levofloxacin Indication: rule out pneumonia  Allergies  Allergen Reactions  . Aspirin Other (See Comments)    GI bleed/ PUD  . Darvocet [Propoxyphene N-Acetaminophen] Nausea And Vomiting  . Diphenhydramine Hcl Other (See Comments)    Glaucoma prevents patient from taking this medication   . Aloe Vera Rash  . Penicillins Swelling and Rash    Patient Measurements: Height: 5\' 1"  (154.9 cm) Weight: 222 lb 10.6 oz (101 kg) IBW/kg (Calculated) : 47.8    Vital Signs: Temp: 97.5 F (36.4 C) (01/13 0659) Temp src: Oral (01/13 0659) BP: 102/49 mmHg (01/13 0659) Pulse Rate: 104 (01/13 0659) Intake/Output from previous day: 01/12 0701 - 01/13 0700 In: 200 [P.O.:200] Out: 950 [Urine:950] Intake/Output from this shift: Total I/O In: 240 [P.O.:240] Out: -   Labs:  Recent Labs  09/06/13 1825 09/07/13 0155  WBC 8.9 7.0  HGB 10.2* 9.6*  PLT 160 126*  CREATININE 1.30* 1.18*   Estimated Creatinine Clearance: 44.2 ml/min (by C-G formula based on Cr of 1.18).  Microbiology: No results found for this or any previous visit (from the past 720 hour(s)).  Medical History: Past Medical History  Diagnosis Date  . Diabetes mellitus   . Hypertension   . Shingles   . GERD (gastroesophageal reflux disease)   . Abnormal heart rhythms   . Thyroid disease     hypthyroidism  . Pulmonary hypertension, moderate to severe     No evidence of CAD on Cath 2011  . Obesity, morbid (more than 100 lbs over ideal weight or BMI > 40)   . Chronic respiratory failure with hypoxia 01/23/2012    Related to OHS  . Glaucoma   . CHF (congestive heart failure)     Echo at [facility] 10/14/2012 see notes  . Acute on chronic renal failure   . COPD (chronic obstructive pulmonary disease)      Assessment: 75 YOF started on levofloxacin for potential CAP. Pharmacy consulted to renally adjust antibiotics. SCr 1.2mg /dL with est CrCL ~61mL/min. Patient  received first dose this morning at ~0230.  Goal of Therapy:  Eradication of infection Proper dosing based on hepatic and renal function  Plan:  1. Change levofloxacin to 750mg  IV q48h- next dose 1/15 2. Follow LOT, clinical progression, c/s, renal function  Biddie Sebek D. Cleta Heatley, PharmD, BCPS Clinical Pharmacist Pager: [phone] 09/07/2013 9:54 AM

## 2013-09-07 NOTE — Progress Notes (Signed)
Patient is currently active with [facility] for chronic disease management services.  Patient has been engaged by a [employer].  Our community based plan of care has focused on disease management of CHF.  Patient may benefit from a referral to the [facility].  Will collaborate with the [facility] for this patient.  Patient will receive a post discharge transition of care call and will be evaluated for monthly home visits for assessments and disease process education.  Made Inpatient Case Manager aware that [facility] following. Of note, [facility] services does not replace or interfere with any services that are arranged by inpatient case management or social work.  For additional questions or referrals please contact Corliss Blacker BSN RN [facility] Liaison at [phone].

## 2013-09-07 NOTE — Progress Notes (Signed)
    Subjective:  Still SOB but imrpoving  Objective:  Vital Signs in the last 24 hours: Temp:  [97.4 F (36.3 C)-97.8 F (36.6 C)] 97.8 F (36.6 C) (01/13 0930) Pulse Rate:  [86-105] 105 (01/13 0930) Resp:  [15-26] 20 (01/13 0930) BP: (102-132)/(49-75) 103/57 mmHg (01/13 0930) SpO2:  [89 %-96 %] 91 % (01/13 0930) Weight:  [214 lb (97.07 kg)-224 lb 2 oz (101.662 kg)] 222 lb 10.6 oz (101 kg) (01/13 0122)  Intake/Output from previous day:  Intake/Output Summary (Last 24 hours) at 09/07/13 1056 Last data filed at 09/07/13 0835  Gross per 24 hour  Intake    440 ml  Output    950 ml  Net   -510 ml    Physical Exam: General appearance: alert, cooperative, no distress and morbidly obese Lungs: decreased breath sounds Heart: regular rate and rhythm Extremities: 1+ edema   Rate: 90  Rhythm: normal sinus rhythm and RBBB  Lab Results:  Recent Labs  09/06/13 1825 09/07/13 0155  WBC 8.9 7.0  HGB 10.2* 9.6*  PLT 160 126*    Recent Labs  09/06/13 1825 09/07/13 0155  NA 139 138  K 3.4* 3.3*  CL 93* 94*  CO2 30 30  GLUCOSE 114* 151*  BUN 30* 28*  CREATININE 1.30* 1.18*    Recent Labs  09/07/13 0155 09/07/13 0747  TROPONINI <0.30 <0.30   No results found for this basename: INR,  in the last 72 hours  Imaging: Imaging results have been reviewed  Cardiac Studies:  Assessment/Plan:   Principal Problem:   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 - With Acute on Chronic exaverbation Active Problems:   Pulmonary hypertension, Severe by Echo 02/1713-pa pressure 74 mmHg   Chronic respiratory failure with hypoxia   DM2 (diabetes mellitus, type 2)   Obesity, morbid (more than 100 lbs over ideal weight or BMI > 40)   CAP (community acquired pneumonia)   Unspecified hypothyroidism   Normal coronary arteries at cath 6948   Diastolic CHF, chronic- EF 65-70% May 2014   Acute blood loss anemia- transfused 10/14- negative endoscopy   PUD (peptic ulcer  disease)- ASA stopped   PLAN: Continue with IV diuresis and Zaroxolyn. It looks like her goal wgt is 211. She says she is compliant with medications and low sodium diet at home. She is refusing Lovenox and compression stockings.   Kerin Ransom PA-C Beeper [phone] 09/07/2013, 10:56 AM   The patient was seen, examined and discussed with Kerin Ransom, PA-C and I agree with the above.   1. Acute on chronic CHF with preserved LVEF - improving symptoms with iv diuresis, we will continue and follow. Needs to replace potassium.   2. Hypertension - well controlled  Ena Dawley, Lemmie Evens 09/07/2013

## 2013-09-07 NOTE — ED Provider Notes (Signed)
Medical screening examination/treatment/procedure(s) were conducted as a shared visit with non-physician practitioner(s) and myself.  I personally evaluated the patient during the encounter.  2 week history of worsening SOB, orthopnea, and leg swelling. NO chest pain.  Increased in usual 4L o2 to 6L.  Multiple admissions for CHF. No distress. Lungs clear, mildly decreased at bases.  +2 pedal edema  EKG Interpretation    Date/Time:  Monday September 06 2013 18:21:17 EST Ventricular Rate:  108 PR Interval:  180 QRS Duration: 96 QT Interval:  328 QTC Calculation: 439 R Axis:   135 Text Interpretation:  Sinus tachycardia with Premature atrial complexes Right axis deviation Incomplete right bundle branch block Possible Right ventricular hypertrophy Nonspecific T wave abnormality Abnormal ECG Nonspecific ST and T wave abnormality Confirmed by Wyvonnia Dusky  MD, Derius Ghosh ([id number]) on 09/06/2013 8:05:42 PM             Ezequiel Essex, MD 09/07/13 [phone]

## 2013-09-08 DIAGNOSIS — E876 Hypokalemia: Secondary | ICD-10-CM

## 2013-09-08 DIAGNOSIS — I2789 Other specified pulmonary heart diseases: Principal | ICD-10-CM

## 2013-09-08 DIAGNOSIS — N179 Acute kidney failure, unspecified: Secondary | ICD-10-CM

## 2013-09-08 DIAGNOSIS — I272 Pulmonary hypertension, unspecified: Secondary | ICD-10-CM

## 2013-09-08 DIAGNOSIS — E871 Hypo-osmolality and hyponatremia: Secondary | ICD-10-CM

## 2013-09-08 DIAGNOSIS — I369 Nonrheumatic tricuspid valve disorder, unspecified: Secondary | ICD-10-CM

## 2013-09-08 LAB — BASIC METABOLIC PANEL
BUN: 30 mg/dL — ABNORMAL HIGH (ref 6–23)
CO2: 27 mEq/L (ref 19–32)
Calcium: 9.4 mg/dL (ref 8.4–10.5)
Chloride: 91 mEq/L — ABNORMAL LOW (ref 96–112)
Creatinine, Ser: 1.35 mg/dL — ABNORMAL HIGH (ref 0.50–1.10)
GFR calc Af Amer: 43 mL/min — ABNORMAL LOW (ref >90)
GFR calc non Af Amer: 37 mL/min — ABNORMAL LOW (ref >90)
Glucose, Bld: 85 mg/dL (ref 70–99)
Potassium: 3.1 mEq/L — ABNORMAL LOW (ref 3.7–5.3)
Sodium: 135 mEq/L — ABNORMAL LOW (ref 137–147)

## 2013-09-08 LAB — GLUCOSE, CAPILLARY
GLUCOSE-CAPILLARY: 99 mg/dL (ref 70–99)
GLUCOSE-CAPILLARY: 185 mg/dL — AB (ref 70–99)
GLUCOSE-CAPILLARY: 118 mg/dL — AB (ref 70–99)
Glucose-Capillary: 196 mg/dL — ABNORMAL HIGH (ref 70–99)

## 2013-09-08 LAB — T4, FREE: FREE T4: 2.06 ng/dL — AB (ref 0.80–1.80)

## 2013-09-08 MED ORDER — POTASSIUM CHLORIDE CRYS ER 20 MEQ PO TBCR
20.0000 meq | EXTENDED_RELEASE_TABLET | Freq: Once | ORAL | Status: AC
  Administered 2013-09-08: 20 meq via ORAL
  Filled 2013-09-08: qty 1

## 2013-09-08 MED ORDER — METOLAZONE 2.5 MG PO TABS
2.5000 mg | ORAL_TABLET | Freq: Every day | ORAL | Status: DC
  Administered 2013-09-09: 2.5 mg via ORAL
  Filled 2013-09-08 (×2): qty 1

## 2013-09-08 MED ORDER — POTASSIUM CHLORIDE CRYS ER 20 MEQ PO TBCR
40.0000 meq | EXTENDED_RELEASE_TABLET | Freq: Two times a day (BID) | ORAL | Status: DC
  Administered 2013-09-08 – 2013-09-09 (×2): 40 meq via ORAL
  Filled 2013-09-08 (×3): qty 2

## 2013-09-08 NOTE — Clinical Documentation Improvement (Signed)
Presents with acute on chronic diastolic heart failure, severe pulmonary hypertension, pneumonia.   Patient normally on 4L Fio2 at home  Currently on 6L in hospital  ABG = 7.51 - 36 - 48 with Bicarb of 29, BE of 6  Being treated with IV Lasix BID  Chronic respiratory failure documented  Please assess above data and render opinion of with diagnosis:  Acute Respiratory Failure Acute on Chronic Respiratory Failure Chronic Respiratory Failure   Thank You, Zoila Shutter ,RN Clinical Documentation Specialist:  [phone]  [facility]

## 2013-09-08 NOTE — Progress Notes (Signed)
Patient: [name] / Admit Date: 09/06/2013 / Date of Encounter: 09/08/2013, 10:51 AM   Subjective  Breathing feels better. Still with significant LEE. Denies palpitations or CP. On 4L/min (home requirement).  Objective   Telemetry: NSR occ PACs,PVCs. Intermittent nonsustained atrial tach, at times without P waves/irregular and other times with varying P wave morphologies  Physical Exam: Blood pressure 110/66, pulse 93, temperature 97.9 F (36.6 C), temperature source Oral, resp. rate 18, height 5\' 1"  (1.549 m), weight 220 lb 3.8 oz (99.9 kg), SpO2 91.00%. General: Well developed, well nourished WF in no acute distress. Head: Normocephalic, atraumatic, sclera non-icteric, no xanthomas, nares are without discharge. Neck:  JVP mod elevated. Lungs: Decreased BS at bases with crackles 1/3 bilaterally. No wheezes or rhonchi. Breathing is unlabored. Heart: RRR S1 S2 without murmurs, rubs, or gallops.  Abdomen: Soft, non-tender, non-distended with normoactive bowel sounds. No rebound/guarding. Extremities: No clubbing or cyanosis. 1+ bilat LE edema. Distal pedal pulses are 2+ and equal bilaterally. Neuro: Alert and oriented X 3. Moves all extremities spontaneously. Psych:  Responds to questions appropriately with a normal affect.   Intake/Output Summary (Last 24 hours) at 09/08/13 1051 Last data filed at 09/08/13 0838  Gross per 24 hour  Intake    560 ml  Output   2100 ml  Net  -1540 ml    Inpatient Medications:  . ALPRAZolam  0.5 mg Oral BID  . brimonidine  1 drop Both Eyes Daily  . docusate sodium  100 mg Oral BID  . enoxaparin (LOVENOX) injection  40 mg Subcutaneous Q24H  . ezetimibe  10 mg Oral QHS  . furosemide  40 mg Intravenous BID  . glipiZIDE  10 mg Oral BID AC  . influenza vac split quadrivalent PF  0.5 mL Intramuscular Tomorrow-1000  . insulin aspart  0-15 Units Subcutaneous TID WC  . insulin aspart  0-5 Units Subcutaneous QHS  . ipratropium  0.5 mg Nebulization  Once  . iron polysaccharides  150 mg Oral Daily  . latanoprost  1 drop Both Eyes QHS  . [START ON 09/09/2013] levofloxacin (LEVAQUIN) IV  750 mg Intravenous Q48H  . levothyroxine  150 mcg Oral QAC breakfast  . loratadine  10 mg Oral Daily  . metolazone  5 mg Oral Daily  . oxyCODONE-acetaminophen  1 tablet Oral BID  . pantoprazole  40 mg Oral BID  . polyethylene glycol  17 g Oral Daily  . potassium chloride SA  40 mEq Oral Daily  . simvastatin  10 mg Oral q1800  . sodium chloride  3 mL Intravenous Q12H  . sodium chloride  3 mL Intravenous Q12H   Infusions:    Labs:  Recent Labs  09/07/13 0155 09/08/13 0419  NA 138 135*  K 3.3* 3.1*  CL 94* 91*  CO2 30 27  GLUCOSE 151* 85  BUN 28* 30*  CREATININE 1.18* 1.35*  CALCIUM 9.2 9.4    Recent Labs  09/06/13 1825  AST 22  ALT 10  ALKPHOS 141*  BILITOT 0.5  PROT 7.0  ALBUMIN 4.1    Recent Labs  09/06/13 1825 09/07/13 0155  WBC 8.9 7.0  NEUTROABS 6.1 5.5  HGB 10.2* 9.6*  HCT 34.1* 33.0*  MCV 65.3* 67.1*  PLT 160 126*    Recent Labs  09/06/13 1912 09/07/13 0155 09/07/13 0747 09/07/13 1305  TROPONINI <0.30 <0.30 <0.30 <0.30   No components found with this basename: POCBNP,   Recent Labs  09/07/13 0155  HGBA1C 6.8*  Radiology/Studies:  Dg Chest Portable 1 View  09/06/2013   CLINICAL DATA:  Short of breath for 1 week. Worsening shortness of breath.  EXAM: PORTABLE CHEST - 1 VIEW  COMPARISON:  DG CHEST 1V PORT dated 04/04/2013; DG CHEST 1V PORT dated 03/15/2013; DG CHEST 1V PORT dated 03/13/2013; DG CHEST 2 VIEW dated 03/08/2013  FINDINGS: Patient is rotated to the right. Oxygen tubing projects over the chest. The cardiopericardial silhouette appears enlarged. Pulmonary vascular congestion is present. There is a new right pleural effusion and right basilar atelectasis and partial collapse. Underlying airspace disease cannot be excluded. Mild left basilar atelectasis is present. Lucency is present in the  retrocardiac area which is favored to be projectional although a hiatal hernia could produce this appearance as well.  IMPRESSION: New right pleural effusion and basilar collapse/consolidation. The appearance is similar to the prior exam 03/13/2013 and failure with asymmetric effusion and edema is favored. Pneumonia or aspiration or in the differential considerations.   Electronically Signed   By: Dereck Ligas M.D.   On: 09/06/2013 19:48      Assessment and Plan   1. Acute on chronic right heart failure/HFpEF due to severe pulmonary hypertension (PA pressure 49mmHg by echo 02/2013, normal cors 2011) 2. Acute renal insufficiency 3. Acute on chronic respiratory failure 4. Morbid obesity with OSA/OHS - Body mass index is 41.64 kg/(m^2). 5. CAP - ? aspiration 6. HTN 7. Peptic ulcer disease 8. Chronic anemia with thrombocytopenia this admission, microcytic, transfused 05/2013 with negative endoscopy. Per GI notes on report, it is unlikely she would tolerate a colonscopy and a SBE would likely not show any treatable lesions thus empiric rx was recommended. No longer on ASA. On PPI for PUD. 9. Diabetes mellitus 10. Hypothyroidism, TSH 0.389  Peak wt 224-> 220 today. Per prior notes, goal weight about 211. Has only diuresed about 2L thus far but dyspnea is improving. MD to review diuretics and determine further course of action. Cr beginning to rise. ? CHF team consult given severe pulm HTN/RHF for consideration of adjunct therapies. 2D echo pending. Significant hypokalemia likely due to metolazone - increase KCl. She remains persistently sinus tach but this has been present on outpatient basis for many months. Had VQ in 12/2012 which was low prob for PE. Will check free T4 given low-normal TSH. ST consult for aspiration eval.  Will also ask MD to review of telemetry given intermittent bursts of intermittently irregular narrow complex tachycardia. She is not on any AV nodal blocking agents but might  benefit from low dose addition of diltiazem.  Signed, Melina Copa PA-C   The patient was seen, examined and discussed with Melina Copa, PA-C and I agree with the above.   In summary, this patient has had recurrent admissions for right sided heart failure. The degree of right ventricular failure and pulmonary hypertension is out of proportion to the degree of left sided diastolic dysfunction (impaired relaxation). We will consult HF team for the possible therapy of her primary pulmonary hypertension. She is feeling better today, however still not at baseline with her weight, Crea is increasing, we will decrease Metolazone to 2.5 mg po daily, continue Lasix 40 mg iv BID.   Ena Dawley, Lemmie Evens 09/08/2013

## 2013-09-08 NOTE — Progress Notes (Signed)
Spoke to patient about wearing the CPAP.  Pt states that she doesn't wear at home and when she tried to wear it here at the hospital that she couldn't stand it; it made her feel like she was suffocating.  The patient stated that she advised the physician of this same information.  Advised the patient that if she changes her mind that the RT would be able to assist.

## 2013-09-08 NOTE — Evaluation (Signed)
Clinical/Bedside Swallow Evaluation Patient Details  Name: [name] MRN: [id number] Date of Birth: December 02, 1934  Today's Date: 09/08/2013 Time: 1000-1020 SLP Time Calculation (min): 20 min  Past Medical History:  Past Medical History  Diagnosis Date  . Diabetes mellitus   . Hypertension   . Shingles   . GERD (gastroesophageal reflux disease)   . Abnormal heart rhythms   . Thyroid disease     hypthyroidism  . Pulmonary hypertension, moderate to severe     No evidence of CAD on Cath 2011  . Obesity, morbid (more than 100 lbs over ideal weight or BMI > 40)   . Chronic respiratory failure with hypoxia 01/23/2012    Related to OHS  . Glaucoma   . CHF (congestive heart failure)     Echo at [facility] 10/14/2012 see notes  . Acute on chronic renal failure   . COPD (chronic obstructive pulmonary disease)    Past Surgical History:  Past Surgical History  Procedure Laterality Date  . Tubal ligation    . Ovary removed    . Knee arthroscopy      left  . Cardiovascular stress test  09/15/2006    EF 81%; normal perfusion all regions; LV normal in size; no scintigraphic evidence of inducible myocardial ischemia  . Cardiac catheterization  02/21/2010    EF 65%; PA pressure 86/21 with mean of 44; normal coronary arteries, primary pumonary hypertension; no significant wall motion abnormalities  . Esophagogastroduodenoscopy N/A 06/25/2013    Procedure: ESOPHAGOGASTRODUODENOSCOPY (EGD);  Surgeon: Cleotis Nipper, MD;  Location: [facility] ENDOSCOPY;  Service: Endoscopy;  Laterality: N/A;  pediatric upper endoscope; no sedation   HPI:  [name] is a 79 y.o. female has a past medical history of Diabetes mellitus; Hypertension; Shingles; GERD (gastroesophageal reflux disease); Obesity, morbid (more than 100 lbs over ideal weight or BMI > 40); Chronic respiratory failure with hypoxia (01/23/2012); CHF (congestive heart failure); Acute on chronic renal failure; and COPD (chronic obstructive pulmonary  disease). Presented with 2 week hx of shortness of breath, increased leg swelling. She has gained 10 lb in the past few days. Denies any chest pain. But has increased work of breathing. A home she is on 4 L of oxygen but required up to 6-8L in ER to maintain Oxygen sats of 91% confirmed by ABG. Patient reports hx of COPD. Denies any fever or chills. Patient gt Lasix 40 mg IV in ER and had some diuresis. Feels better. CXR showing right pleural effusion and basilar collapse/consolidation clinically pneumonia less likely.    Assessment / Plan / Recommendation Clinical Impression  Pt demonstrates normal swallow function without evidence of aspiration. Pt is at risk due to COPD and decreased respiratory endurance during swallowing, discussed precautions via teach back method. Pt able to repeat. Pt does not have dentures or dentition, but reports she tolerates hospital foods well, will not modifity diet as she is cogntiively intact. No SLP f/u needed.     Aspiration Risk  Mild    Diet Recommendation Regular;Thin liquid   Liquid Administration via: Cup;No straw Medication Administration: Whole meds with liquid Supervision: Patient able to self feed Compensations: Slow rate;Small sips/bites Postural Changes and/or Swallow Maneuvers: Seated upright 90 degrees;Upright 30-60 min after meal    Other  Recommendations Oral Care Recommendations: Oral care BID   Follow Up Recommendations  None    Frequency and Duration        Pertinent Vitals/Pain NA    SLP Swallow Goals  Swallow Study Prior Functional Status       General HPI: [name] is a 79 y.o. female has a past medical history of Diabetes mellitus; Hypertension; Shingles; GERD (gastroesophageal reflux disease); Obesity, morbid (more than 100 lbs over ideal weight or BMI > 40); Chronic respiratory failure with hypoxia (01/23/2012); CHF (congestive heart failure); Acute on chronic renal failure; and COPD (chronic obstructive  pulmonary disease). Presented with 2 week hx of shortness of breath, increased leg swelling. She has gained 10 lb in the past few days. Denies any chest pain. But has increased work of breathing. A home she is on 4 L of oxygen but required up to 6-8L in ER to maintain Oxygen sats of 91% confirmed by ABG. Patient reports hx of COPD. Denies any fever or chills. Patient gt Lasix 40 mg IV in ER and had some diuresis. Feels better. CXR showing right pleural effusion and basilar collapse/consolidation clinically pneumonia less likely.  Type of Study: Bedside swallow evaluation Diet Prior to this Study: Regular;Thin liquids Temperature Spikes Noted: No Respiratory Status: Nasal cannula History of Recent Intubation: No Behavior/Cognition: Alert;Cooperative;Pleasant mood Oral Cavity - Dentition: Edentulous Self-Feeding Abilities: Able to feed self Patient Positioning: Upright in chair Baseline Vocal Quality: Clear Volitional Cough: Strong Volitional Swallow: Able to elicit    Oral/Motor/Sensory Function Overall Oral Motor/Sensory Function: Appears within functional limits for tasks assessed   Ice Chips     Thin Liquid Thin Liquid: Within functional limits Presentation: Straw;Cup;Self Fed    Nectar Thick Nectar Thick Liquid: Not tested   Honey Thick Honey Thick Liquid: Not tested   Puree Puree: Within functional limits   Solid   GO    Solid: Impaired Presentation: Self Fed Other Comments: inadequate mastication, no dentition      [name], MA CCC-SLP [phone]  Lynann Beaver 09/08/2013,11:36 AM

## 2013-09-08 NOTE — Evaluation (Signed)
Occupational Therapy Evaluation Patient Details Name: [name] MRN: [id number] DOB: 1935-08-12 Today's Date: 09/08/2013 Time: 9485-4627 OT Time Calculation (min): 20 min  OT Assessment / Plan / Recommendation History of present illness 2 week hx of shortness of breath, increased leg swelling. She has gained 10 lb in the past few days. Denies any chest pain. But has increased work of breathing. A home she is on 4 L of oxygen but required up to 6-8L  in ER to maintain Oxygen sats of 91% confirmed by ABG. Patient reports hx of COPD. Denies any fever or chills. Patient gt Lasix 40 mg IV in ER and had some diuresis. Feels better. CXR showing right pleural effusion and basilar collapse/consolidation   Clinical Impression   Pt admitted with above, and presents to OT with the below listed deficits.  She overall requires min A - min guard assist with BADLs.  Pt fatigues quickly.  She has a PCA 3 hours/day 5 days/wk at home, and states she currently thinks she is stronger/better than she has been in the last month.   Pt's goal is to go home, but considering that she has been admitted x4 in past 6 mos, she is likely functioning marginally at home, and best recommendation would be SNF; however, it is likely that she will refuse.     OT Assessment  Patient needs continued OT Services    Follow Up Recommendations  SNF;Supervision/Assistance - 24 hour    Barriers to Discharge Decreased caregiver support    Equipment Recommendations  None recommended by OT    Recommendations for Other Services    Frequency  Min 2X/week    Precautions / Restrictions Precautions Precautions: Fall Precaution Comments: requires 02   Pertinent Vitals/Pain     ADL  Eating/Feeding: Independent Where Assessed - Eating/Feeding: Chair Grooming: Wash/dry hands;Wash/dry face;Teeth care;Brushing hair;Min guard Where Assessed - Grooming: Supported sitting;Supported standing Upper Body Bathing: Set up Where Assessed  - Upper Body Bathing: Supported sitting Lower Body Bathing: Minimal assistance Where Assessed - Lower Body Bathing: Supported sit to stand Upper Body Dressing: Supervision/safety Where Assessed - Upper Body Dressing: Unsupported sitting Lower Body Dressing: Minimal assistance Where Assessed - Lower Body Dressing: Supported sit to stand Toilet Transfer: Min Psychiatric nurse Method: Sit to stand;Stand pivot Science writer: Comfort height toilet;Raised toilet seat with arms (or 3-in-1 over toilet) Toileting - Clothing Manipulation and Hygiene: Min guard Where Assessed - Toileting Clothing Manipulation and Hygiene: Standing Transfers/Ambulation Related to ADLs: min guard assist with RW.  Pt fatigues quickly  ADL Comments: Pt fatigues easily     OT Diagnosis: Generalized weakness  OT Problem List: Decreased strength;Decreased activity tolerance;Impaired balance (sitting and/or standing);Decreased knowledge of use of DME or AE;Cardiopulmonary status limiting activity;Obesity OT Treatment Interventions: Self-care/ADL training;DME and/or AE instruction;Energy conservation;Therapeutic activities;Patient/family education;Balance training   OT Goals(Current goals can be found in the care plan section) Acute Rehab OT Goals Patient Stated Goal: To get better OT Goal Formulation: With patient Time For Goal Achievement: 09/22/13 Potential to Achieve Goals: Good ADL Goals Pt Will Perform Grooming: with supervision;standing Pt Will Perform Lower Body Bathing: with supervision;sit to/from stand Pt Will Perform Lower Body Dressing: with supervision;sit to/from stand Pt Will Transfer to Toilet: with supervision;ambulating;regular height toilet;grab bars Pt Will Perform Tub/Shower Transfer: Tub transfer;with supervision;ambulating;rolling walker Additional ADL Goal #1: Pt will be independent with energy conservation techniques  Visit Information  Last OT Received On: 09/08/13 Assistance  Needed: +1 History of Present Illness: 2  week hx of shortness of breath, increased leg swelling. She has gained 10 lb in the past few days. Denies any chest pain. But has increased work of breathing. A home she is on 4 L of oxygen but required up to 6-8L  in ER to maintain Oxygen sats of 91% confirmed by ABG. Patient reports hx of COPD. Denies any fever or chills. Patient gt Lasix 40 mg IV in ER and had some diuresis. Feels better. CXR showing right pleural effusion and basilar collapse/consolidation       Prior Functioning     Home Living Family/patient expects to be discharged to:: Private residence Living Arrangements: Other relatives (brother who is disabled) Available Help at Discharge: Family;Personal care attendant;Available PRN/intermittently Type of Home: Mobile home Home Access: Ramped entrance Home Layout: One level Home Equipment: [place] - 2 wheels;Bedside commode;Wheelchair - manual Additional Comments: Pt reports she has a PCA 3 hours/day 5 days/wk.  Dtr checks on her every couple of days Prior Function Level of Independence: Needs assistance Gait / Transfers Assistance Needed: uses furniture to stabilize or uses walker ADL's / Homemaking Assistance Needed: Pt reports she typically performs bathing and dressing unless she can't, and then dtr comes to assist Comments: PCA and dtr assist with meals and cleaning  Communication Communication: No difficulties Dominant Hand: Right         Vision/Perception     Cognition  Cognition Arousal/Alertness: Awake/alert Behavior During Therapy: WFL for tasks assessed/performed Overall Cognitive Status: Within Functional Limits for tasks assessed    Extremity/Trunk Assessment Upper Extremity Assessment Upper Extremity Assessment: Generalized weakness Lower Extremity Assessment Lower Extremity Assessment: Defer to PT evaluation Cervical / Trunk Assessment Cervical / Trunk Assessment: Normal     Mobility Transfers Overall  transfer level: Needs assistance Transfers: Sit to/from Stand;Stand Pivot Transfers Sit to Stand: Min guard Stand pivot transfers: Min guard General transfer comment: min guard for balance     Exercise     Balance Balance Overall balance assessment: Needs assistance Standing balance support: No upper extremity supported Standing balance-Leahy Scale: Fair   End of Session OT - End of Session Equipment Utilized During Treatment: Rolling walker;Oxygen Activity Tolerance: Patient tolerated treatment well Patient left: in chair;with call bell/phone within reach Nurse Communication: Mobility status  [place], Ellard Artis M 09/08/2013, 1:01 PM

## 2013-09-08 NOTE — Progress Notes (Addendum)
[facility] PROGRESS NOTE  [name] WEX:[id number] DOB: 29-Dec-1934 DOA: 09/06/2013 PCP: Glo Herring., MD  Assessment/Plan: 1. Acute on chronic CHF with preserved LVEF: Cardiology following. Transthoracic echocardiogram performed today showing ejection fraction 55-60% without wall motion abnormalities. Could not evaluate LV diastolic function. Pulmonary artery PA pressure 67 mmHg. Right ventricle severely dilated. Cardiology recommending consulting heart failure changes.  2. Acute renal failure. Patient's creatinine trended up to 1.35-1.18, likely secondary to diuretic therapy. Metolazone therapy been decreased to 2.5 mg, keeping Lasix at 40 mg IV twice a day. We'll monitor kidney function 3. Hypertension: Patient's blood pressures remaining low with systolic blood pressures in the 90s, she is on IV Lasix and metolazone 4. Obstructive sleep apnea. Provide NPPV q hs.  5. DM2: blood sugars well controled. Continue with glipizide, stop standing insulin with meals. ssi sensitive. 6. CAP: abnormal finding on xray. Pneumonia vs aspiration with similar finding on 03/13/14.  No fever or white count. Treated empirically for CAP with levaquin. 7. PUD: PPI 8. Pulmonary hypertension  Code Status: partial DNI Family Communication: spoke with patient and daughter Disposition Plan: inpatient   Consultants:  cardiology  Procedures:  none  Antibiotics: levaquin 1/13>>  HPI/Subjective: Patient states feeling better today, breathing easier, though continues to have bilateral extremity pitting edema  Objective: Filed Vitals:   09/08/13 1615  BP: 96/62  Pulse: 98  Temp: 97.3 F (36.3 C)  Resp: 18    Intake/Output Summary (Last 24 hours) at 09/08/13 1628 Last data filed at 09/08/13 0838  Gross per 24 hour  Intake    560 ml  Output   2100 ml  Net  -1540 ml   Filed Weights   09/06/13 1826 09/07/13 0122 09/08/13 0500  Weight: 101.662 kg (224 lb 2 oz) 101 kg (222 lb 10.6  oz) 99.9 kg (220 lb 3.8 oz)    Exam:   General:  NAD, resting in chair  Cardiovascular: RRR no mrg  Respiratory: bibasilar crackles to mid lung fields, good air movement  Abdomen: BS+ soft nt  Musculoskeletal: 1+ edema bilaterally  Data Reviewed: Basic Metabolic Panel:  Recent Labs Lab 09/06/13 1825 09/07/13 0155 09/08/13 0419  NA 139 138 135*  K 3.4* 3.3* 3.1*  CL 93* 94* 91*  CO2 30 30 27   GLUCOSE 114* 151* 85  BUN 30* 28* 30*  CREATININE 1.30* 1.18* 1.35*  CALCIUM 9.6 9.2 9.4   Liver Function Tests:  Recent Labs Lab 09/06/13 1825  AST 22  ALT 10  ALKPHOS 141*  BILITOT 0.5  PROT 7.0  ALBUMIN 4.1   No results found for this basename: LIPASE, AMYLASE,  in the last 168 hours No results found for this basename: AMMONIA,  in the last 168 hours CBC:  Recent Labs Lab 09/06/13 1825 09/07/13 0155  WBC 8.9 7.0  NEUTROABS 6.1 5.5  HGB 10.2* 9.6*  HCT 34.1* 33.0*  MCV 65.3* 67.1*  PLT 160 126*   Cardiac Enzymes:  Recent Labs Lab 09/06/13 1912 09/07/13 0155 09/07/13 0747 09/07/13 1305  TROPONINI <0.30 <0.30 <0.30 <0.30   BNP (last 3 results)  Recent Labs  04/04/13 1618 06/24/13 1825 09/06/13 1825  PROBNP 2372.0* 2425.0* 2771.0*   CBG:  Recent Labs Lab 09/07/13 0650 09/07/13 1116 09/07/13 1617 09/07/13 2131 09/08/13 0604  GLUCAP 160* 118* 221* 93 99    No results found for this or any previous visit (from the past 240 hour(s)).   Studies: Dg Chest Portable 1 View  09/06/2013   CLINICAL  DATA:  Short of breath for 1 week. Worsening shortness of breath.  EXAM: PORTABLE CHEST - 1 VIEW  COMPARISON:  DG CHEST 1V PORT dated 04/04/2013; DG CHEST 1V PORT dated 03/15/2013; DG CHEST 1V PORT dated 03/13/2013; DG CHEST 2 VIEW dated 03/08/2013  FINDINGS: Patient is rotated to the right. Oxygen tubing projects over the chest. The cardiopericardial silhouette appears enlarged. Pulmonary vascular congestion is present. There is a new right pleural  effusion and right basilar atelectasis and partial collapse. Underlying airspace disease cannot be excluded. Mild left basilar atelectasis is present. Lucency is present in the retrocardiac area which is favored to be projectional although a hiatal hernia could produce this appearance as well.  IMPRESSION: New right pleural effusion and basilar collapse/consolidation. The appearance is similar to the prior exam 03/13/2013 and failure with asymmetric effusion and edema is favored. Pneumonia or aspiration or in the differential considerations.   Electronically Signed   By: Dereck Ligas M.D.   On: 09/06/2013 19:48    Scheduled Meds: . ALPRAZolam  0.5 mg Oral BID  . brimonidine  1 drop Both Eyes Daily  . docusate sodium  100 mg Oral BID  . enoxaparin (LOVENOX) injection  40 mg Subcutaneous Q24H  . ezetimibe  10 mg Oral QHS  . furosemide  40 mg Intravenous BID  . glipiZIDE  10 mg Oral BID AC  . insulin aspart  0-15 Units Subcutaneous TID WC  . insulin aspart  0-5 Units Subcutaneous QHS  . ipratropium  0.5 mg Nebulization Once  . iron polysaccharides  150 mg Oral Daily  . latanoprost  1 drop Both Eyes QHS  . [START ON 09/09/2013] levofloxacin (LEVAQUIN) IV  750 mg Intravenous Q48H  . levothyroxine  150 mcg Oral QAC breakfast  . loratadine  10 mg Oral Daily  . [START ON 09/09/2013] metolazone  2.5 mg Oral Daily  . oxyCODONE-acetaminophen  1 tablet Oral BID  . pantoprazole  40 mg Oral BID  . polyethylene glycol  17 g Oral Daily  . potassium chloride SA  40 mEq Oral BID  . simvastatin  10 mg Oral q1800  . sodium chloride  3 mL Intravenous Q12H  . sodium chloride  3 mL Intravenous Q12H   Continuous Infusions:   Principal Problem:   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 - With Acute on Chronic exaverbation Active Problems:   Chronic respiratory failure with hypoxia   DM2 (diabetes mellitus, type 2)   Unspecified hypothyroidism   Normal coronary arteries at cath 2011   Obesity,  morbid (more than 100 lbs over ideal weight or BMI > 40)   Pulmonary hypertension, Severe by Echo 02/1713-pa pressure 74 mmHg   Diastolic CHF, chronic- EF 65-70% May 2014   Acute blood loss anemia- transfused 10/14- negative endoscopy   PUD (peptic ulcer disease)- ASA stopped   CAP (community acquired pneumonia)   Pulmonary hypertension    Time spent: 35 minutes    [name]  [facility] Pager [phone]. If 7PM-7AM, please contact night-coverage at [email or web address], password [facility] 09/08/2013, 4:28 PM  LOS: 2 days

## 2013-09-08 NOTE — Progress Notes (Signed)
  Echocardiogram 2D Echocardiogram has been performed.  [name] 09/08/2013, 12:13 PM

## 2013-09-08 NOTE — Evaluation (Signed)
Physical Therapy Evaluation Patient Details Name: [name] MRN: [id number] DOB: 24-Jun-1935 Today's Date: 09/08/2013 Time: 6269-4854 PT Time Calculation (min): 24 min  PT Assessment / Plan / Recommendation History of Present Illness  2 week hx of shortness of breath, increased leg swelling. She has gained 10 lb in the past few days. Denies any chest pain. But has increased work of breathing. A home she is on 4 L of oxygen but required up to 6-8L  in ER to maintain Oxygen sats of 91% confirmed by ABG. Patient reports hx of COPD. Denies any fever or chills. Patient gt Lasix 40 mg IV in ER and had some diuresis. Feels better. CXR showing right pleural effusion and basilar collapse/consolidation  Clinical Impression  Pt admitted with SOB, COPD exac, pleural effusion. Pt currently with functional limitations due to the deficits listed below (see PT Problem List).  Pt will benefit from skilled PT to increase their independence and safety with mobility to allow discharge home with available assist      PT Assessment  Patient needs continued PT services    Follow Up Recommendations  Home health PT    Does the patient have the potential to tolerate intense rehabilitation      Barriers to Discharge        Equipment Recommendations  None recommended by PT    Recommendations for Other Services     Frequency Min 3X/week    Precautions / Restrictions Precautions Precautions: Fall Precaution Comments: requires 02 Restrictions Weight Bearing Restrictions: No   Pertinent Vitals/Pain sats on 6L [place] dropped during gait to 79% EHR 100 to 110's,  3 minutes at rest to return to 90%.      Mobility  Transfers Overall transfer level: Needs assistance Transfers: Sit to/from Stand Sit to Stand: Min guard General transfer comment: min guard for balance Ambulation/Gait Ambulation/Gait assistance: Min guard Ambulation Distance (Feet): 22 Feet Assistive device: Rolling walker (2  wheeled) Gait Pattern/deviations: Step-through pattern    Exercises     PT Diagnosis: Difficulty walking;Other (comment) (decr activity tolerance)  PT Problem List: Decreased activity tolerance;Decreased strength;Cardiopulmonary status limiting activity PT Treatment Interventions: DME instruction;Gait training;Functional mobility training;Therapeutic activities;Patient/family education     PT Goals(Current goals can be found in the care plan section) Acute Rehab PT Goals Patient Stated Goal: To get better PT Goal Formulation: With patient Time For Goal Achievement: 09/08/13 Potential to Achieve Goals: Good  Visit Information  Last PT Received On: 09/08/13 Assistance Needed: +1 History of Present Illness: 2 week hx of shortness of breath, increased leg swelling. She has gained 10 lb in the past few days. Denies any chest pain. But has increased work of breathing. A home she is on 4 L of oxygen but required up to 6-8L  in ER to maintain Oxygen sats of 91% confirmed by ABG. Patient reports hx of COPD. Denies any fever or chills. Patient gt Lasix 40 mg IV in ER and had some diuresis. Feels better. CXR showing right pleural effusion and basilar collapse/consolidation       Prior Functioning  Home Living Family/patient expects to be discharged to:: Private residence Living Arrangements: Other relatives Available Help at Discharge: Family;Personal care attendant;Available PRN/intermittently Type of Home: Mobile home Home Access: Ramped entrance Home Layout: One level Home Equipment: [place] - 2 wheels;Bedside commode;Wheelchair - manual Additional Comments: Pt reports she has a PCA 3 hours/day 5 days/wk.  Dtr checks on her every couple of days Prior Function Level of Independence: Needs  assistance Gait / Transfers Assistance Needed: uses furniture to stabilize or uses walker ADL's / Homemaking Assistance Needed: Pt reports she typically performs bathing and dressing unless she can't, and  then dtr comes to assist Comments: PCA and dtr assist with meals and cleaning  Communication Communication: No difficulties Dominant Hand: Right    Cognition  Cognition Arousal/Alertness: Awake/alert Behavior During Therapy: WFL for tasks assessed/performed Overall Cognitive Status: Within Functional Limits for tasks assessed    Extremity/Trunk Assessment Lower Extremity Assessment Lower Extremity Assessment: Generalized weakness Cervical / Trunk Assessment Cervical / Trunk Assessment: Normal   Balance Balance Overall balance assessment: Needs assistance Sitting balance-Leahy Scale: Fair Standing balance support: No upper extremity supported Standing balance-Leahy Scale: Fair  End of Session PT - End of Session Equipment Utilized During Treatment: Oxygen Activity Tolerance: Patient tolerated treatment well;Patient limited by fatigue Patient left: in chair;with call bell/phone within reach;with family/visitor present Nurse Communication: Mobility status  GP     Shatoria [place], Tessie Fass 09/08/2013, 4:07 PM 09/08/2013  Donnella Sham, [place] [phone]  (pager)

## 2013-09-09 ENCOUNTER — Telehealth: Payer: Self-pay | Admitting: Internal Medicine

## 2013-09-09 LAB — BASIC METABOLIC PANEL
BUN: 31 mg/dL — ABNORMAL HIGH (ref 6–23)
CALCIUM: 9.4 mg/dL (ref 8.4–10.5)
CO2: 30 meq/L (ref 19–32)
Chloride: 91 mEq/L — ABNORMAL LOW (ref 96–112)
Creatinine, Ser: 1.29 mg/dL — ABNORMAL HIGH (ref 0.50–1.10)
GFR calc Af Amer: 45 mL/min — ABNORMAL LOW (ref >90)
GFR calc non Af Amer: 39 mL/min — ABNORMAL LOW (ref >90)
Glucose, Bld: 91 mg/dL (ref 70–99)
Potassium: 3.4 mEq/L — ABNORMAL LOW (ref 3.7–5.3)
SODIUM: 135 meq/L — AB (ref 137–147)

## 2013-09-09 LAB — GLUCOSE, CAPILLARY
GLUCOSE-CAPILLARY: 171 mg/dL — AB (ref 70–99)
Glucose-Capillary: 94 mg/dL (ref 70–99)
Glucose-Capillary: 161 mg/dL — ABNORMAL HIGH (ref 70–99)
Glucose-Capillary: 149 mg/dL — ABNORMAL HIGH (ref 70–99)

## 2013-09-09 LAB — CBC
HCT: 32.8 % — ABNORMAL LOW (ref 36.0–46.0)
Hemoglobin: 9.7 g/dL — ABNORMAL LOW (ref 12.0–15.0)
MCH: 19.2 pg — ABNORMAL LOW (ref 26.0–34.0)
MCHC: 29.6 g/dL — ABNORMAL LOW (ref 30.0–36.0)
MCV: 64.8 fL — ABNORMAL LOW (ref 78.0–100.0)
Platelets: 136 10*3/uL — ABNORMAL LOW (ref 150–400)
RBC: 5.06 MIL/uL (ref 3.87–5.11)
RDW: 18.1 % — ABNORMAL HIGH (ref 11.5–15.5)
WBC: 6.5 10*3/uL (ref 4.0–10.5)

## 2013-09-09 MED ORDER — FUROSEMIDE 10 MG/ML IJ SOLN
15.0000 mg/h | INTRAVENOUS | Status: DC
  Administered 2013-09-09 – 2013-09-13 (×3): 10 mg/h via INTRAVENOUS
  Administered 2013-09-14 (×2): 15 mg/h via INTRAVENOUS
  Filled 2013-09-09 (×15): qty 25

## 2013-09-09 MED ORDER — POTASSIUM CHLORIDE CRYS ER 20 MEQ PO TBCR
40.0000 meq | EXTENDED_RELEASE_TABLET | Freq: Three times a day (TID) | ORAL | Status: DC
  Administered 2013-09-09 – 2013-09-15 (×18): 40 meq via ORAL
  Filled 2013-09-09 (×23): qty 2

## 2013-09-09 MED ORDER — POTASSIUM CHLORIDE CRYS ER 20 MEQ PO TBCR
40.0000 meq | EXTENDED_RELEASE_TABLET | Freq: Once | ORAL | Status: AC
Start: 2013-09-10 — End: 2013-09-10
  Administered 2013-09-10: 40 meq via ORAL
  Filled 2013-09-09: qty 2

## 2013-09-09 MED ORDER — LEVOFLOXACIN 750 MG PO TABS: 750.0000 mg | ORAL_TABLET | ORAL | Status: DC

## 2013-09-09 NOTE — Progress Notes (Signed)
Patient: [name] / Admit Date: 09/06/2013 / Date of Encounter: 09/09/2013, 10:44 AM  [facility] Patient  Subjective  79 y.o. morbidly obese female with a history of right heart failure, severe pulmonary hypertension and obstructive sleep apnea, intolerant to CPAP. Wears oxygen 24/7 @ 4 liters. Had sleep stud 02/2012 no sleep apnea noted.  She is followed by Dr Halford Chessman. She has had multiple admissions this year for acute on chronic diastolic HF. Her last echo was on 072014, which demonstrated normal EF of 65-70% and mod LVH   Weights from the community 207 in October 2014 up to 232 in December.   Prior to admit weight trending up from 212 to 219 pounds. Torsemide increased to 100 mg daily but with poor response. She also had increased dyspnea on exertion and orthopnea. Only able to walk to bathroom. Uses wheel chair in store.   Yesterday she continued on bolus lasix. Complains of dyspnea with exertion. Weight up 1 pound. 24 hour I/O -1.2 liters.    PFT 03/10/12>>FEV1 1.65 (100%), FEV1% 78, TLC 3.96 (95%), DLCO 53, no BD 09/08/13 ECHO EF 55-60% Peak PA pressure 67 RV severely dilated TSH 0.389 K 3.4  Creatinine 1.29  Objective   Telemetry: NSR occ PACs,PVCs 118   Physical Exam: Blood pressure 104/64, pulse 94, temperature 97.9 F (36.6 C), temperature source Oral, resp. rate 18, height 5\' 1"  (1.549 m), weight 221 lb 1.9 oz (100.3 kg), SpO2 91.00%. General: Well developed, well nourished WF in no acute distress.Sitting chair Head: Normocephalic, atraumatic, sclera non-icteric, no xanthomas, nares are without discharge. Neck:  JVP to jaw. Lungs: LUL LLL RML RLL crackles. No wheezes or rhonchi. Nonlabored. On 4 liters [place].  Heart: Tachycardic irregular. S1 S2 without murmurs, rubs, or gallops.  Abdomen: Soft, non-tender, non-distended with normoactive bowel sounds. No rebound/guarding. Extremities: No clubbing or cyanosis. R and LLE 3+ edema. Distal pedal pulses are 2+ and equal  bilaterally. Neuro: Alert and oriented X 3. Moves all extremities spontaneously. Psych:  Responds to questions appropriately with a normal affect.   Intake/Output Summary (Last 24 hours) at 09/09/13 1044 Last data filed at 09/09/13 0443  Gross per 24 hour  Intake    360 ml  Output   1900 ml  Net  -1540 ml    Inpatient Medications:  . ALPRAZolam  0.5 mg Oral BID  . brimonidine  1 drop Both Eyes Daily  . docusate sodium  100 mg Oral BID  . enoxaparin (LOVENOX) injection  40 mg Subcutaneous Q24H  . ezetimibe  10 mg Oral QHS  . furosemide  40 mg Intravenous BID  . glipiZIDE  10 mg Oral BID AC  . insulin aspart  0-15 Units Subcutaneous TID WC  . insulin aspart  0-5 Units Subcutaneous QHS  . ipratropium  0.5 mg Nebulization Once  . iron polysaccharides  150 mg Oral Daily  . latanoprost  1 drop Both Eyes QHS  . [START ON 09/11/2013] levofloxacin  750 mg Oral Q48H  . levothyroxine  150 mcg Oral QAC breakfast  . loratadine  10 mg Oral Daily  . metolazone  2.5 mg Oral Daily  . oxyCODONE-acetaminophen  1 tablet Oral BID  . pantoprazole  40 mg Oral BID  . polyethylene glycol  17 g Oral Daily  . potassium chloride SA  40 mEq Oral BID  . simvastatin  10 mg Oral q1800  . sodium chloride  3 mL Intravenous Q12H  . sodium chloride  3 mL Intravenous Q12H  Infusions:    Labs:  Recent Labs  09/08/13 0419 09/09/13 0557  NA 135* 135*  K 3.1* 3.4*  CL 91* 91*  CO2 27 30  GLUCOSE 85 91  BUN 30* 31*  CREATININE 1.35* 1.29*  CALCIUM 9.4 9.4    Recent Labs  09/06/13 1825  AST 22  ALT 10  ALKPHOS 141*  BILITOT 0.5  PROT 7.0  ALBUMIN 4.1    Recent Labs  09/06/13 1825 09/07/13 0155 09/09/13 0557  WBC 8.9 7.0 6.5  NEUTROABS 6.1 5.5  --   HGB 10.2* 9.6* 9.7*  HCT 34.1* 33.0* 32.8*  MCV 65.3* 67.1* 64.8*  PLT 160 126* 136*    Recent Labs  09/06/13 1912 09/07/13 0155 09/07/13 0747 09/07/13 1305  TROPONINI <0.30 <0.30 <0.30 <0.30   No components found with this  basename: POCBNP,   Recent Labs  09/07/13 0155  HGBA1C 6.8*     Radiology/Studies:  Dg Chest Portable 1 View  09/06/2013   CLINICAL DATA:  Short of breath for 1 week. Worsening shortness of breath.  EXAM: PORTABLE CHEST - 1 VIEW  COMPARISON:  DG CHEST 1V PORT dated 04/04/2013; DG CHEST 1V PORT dated 03/15/2013; DG CHEST 1V PORT dated 03/13/2013; DG CHEST 2 VIEW dated 03/08/2013  FINDINGS: Patient is rotated to the right. Oxygen tubing projects over the chest. The cardiopericardial silhouette appears enlarged. Pulmonary vascular congestion is present. There is a new right pleural effusion and right basilar atelectasis and partial collapse. Underlying airspace disease cannot be excluded. Mild left basilar atelectasis is present. Lucency is present in the retrocardiac area which is favored to be projectional although a hiatal hernia could produce this appearance as well.  IMPRESSION: New right pleural effusion and basilar collapse/consolidation. The appearance is similar to the prior exam 03/13/2013 and failure with asymmetric effusion and edema is favored. Pneumonia or aspiration or in the differential considerations.   Electronically Signed   By: Dereck Ligas M.D.   On: 09/06/2013 19:48      Assessment and Plan   1. Acute on chronic right heart failure/HFpEF due to severe pulmonary hypertension (PA pressure 50mmHg by echo 02/2013, normal cors 2011) 2. Acute renal insufficiency  3. Acute on chronic respiratory failure on 4 liters [place] chronically  4. Morbid obesity with OSA/OHS - Body mass index is 41.64 kg/(m^2). 5. CAP - ? aspiration 6. HTN 7. Peptic ulcer disease 8. Chronic anemia with thrombocytopenia this admission, microcytic, transfused 05/2013 with negative endoscopy. Per GI notes on report, it is unlikely she would tolerate a colonscopy and a SBE would likely not show any treatable lesions thus empiric rx was recommended. No longer on ASA. On PPI for PUD. 9. Diabetes mellitus 10.  Hypothyroidism, TSH 0.389  Sluggish diuresis with metolazone and bolus lasix. Volume status remains elevated. Weight up 1 pound. Switch to lasix drip at 10 mg per hour. Follow renal function closely. May need RHC after volume status improves.   Consult dietitian for HF diet.   CLEGG,AMY NP-C 11:24 AM  Patient seen and examined with Darrick Grinder, NP. We discussed all aspects of the encounter. I agree with the assessment and plan as stated above.   She remains markedly volume overloaded. Agree with lasix gtt and can add metolazone as needed. Suspect pHTN is all secondary mostly due to diastolic HF and obesity hypoventilation syndrome.   Benay Spice 5:32 PM

## 2013-09-09 NOTE — Progress Notes (Signed)
CSW reviewed chart and noticed OT recommended snf. CSW then went to patient's room to speak to patient about SNF. CSW introduced self and explained reason for visit, while explaining SNF. Patient states she does not want to go to SNF and wants to go home with Home Health. Patient stated she has her brother at home along with her aide and daughters who help her out. CSW explained that CM will help patient set up home health. CSW signing off at this time. Please consult if social work needs arise.   Jeanette Caprice, MSW, [place]

## 2013-09-09 NOTE — Telephone Encounter (Signed)
Just wanted to let Dr Debara Pickett know that she is in [facility] since Monday (12th)

## 2013-09-09 NOTE — Progress Notes (Signed)
MD order to place TED hose, ordered ( XL) per material management dept. This is the largest size available in their storeroom. Attempted to place these hose on patient they are extremely tight, leaving indent on patient skin she states " take these off they are too tight. Again called material management dept verify if larger size availabe Joylene Draft A

## 2013-09-09 NOTE — Progress Notes (Addendum)
[facility] PROGRESS NOTE  [name] CBJ:[id number] DOB: 1935-04-22 DOA: 09/06/2013 PCP: Glo Herring., MD  Assessment/Plan: 1. Acute on chronic CHF with preserved LVEF: Cardiology following. Transthoracic echocardiogram performed today showing ejection fraction 55-60% without wall motion abnormalities. Could not evaluate LV diastolic function. Pulmonary artery PA pressure 67 mmHg. Right ventricle severely dilated. She has had a net negative fluid balance of 821 the last 24 hours. Her weights is up from 99.9 kg to 100.3 kg. She was referred to the heart failure team, started on a Lasix drip today given weight gain. 2. Acute renal failure. There is improvement her creatinine today as lab work showed a downward trend from 1.35-1.29. Likely secondary to diuretic therapy 3. Hypertension: Her blood pressures are stable, on Lasix drip now. 4. Obstructive sleep apnea. Patient refusing to wear CPAP yesterday evening.  5. Question community acquired PNA. She does not appear to have clinical findings suggestive of PNA. Wiil discontinue AB's for now.  6. DM2: blood sugars well controled. Continue with glipizide, stop standing insulin with meals. ssi sensitive. 7. CAP: abnormal finding on xray. Pneumonia vs aspiration with similar finding on 03/13/14.  No fever or white count. Treated empirically for CAP with levaquin. 8. PUD: PPI 9. Pulmonary hypertension  Code Status: partial DNI Family Communication: spoke with patient and daughter Disposition Plan: inpatient   Consultants:  cardiology  Procedures:  none  Antibiotics: levaquin 1/13>>  HPI/Subjective: Patient feels improved today.  Objective: Filed Vitals:   09/09/13 1356  BP: 100/64  Pulse: 88  Temp: 98 F (36.7 C)  Resp: 18    Intake/Output Summary (Last 24 hours) at 09/09/13 1426 Last data filed at 09/09/13 1230  Gross per 24 hour  Intake    840 ml  Output   2201 ml  Net  -1361 ml   Filed Weights    09/07/13 0122 09/08/13 0500 09/09/13 0440  Weight: 101 kg (222 lb 10.6 oz) 99.9 kg (220 lb 3.8 oz) 100.3 kg (221 lb 1.9 oz)    Exam:   General:  NAD, resting in chair  Cardiovascular: RRR no mrg  Respiratory: bibasilar crackles to mid lung fields, good air movement  Abdomen: BS+ soft nt  Musculoskeletal: 1+ edema bilaterally  Data Reviewed: Basic Metabolic Panel:  Recent Labs Lab 09/06/13 1825 09/07/13 0155 09/08/13 0419 09/09/13 0557  NA 139 138 135* 135*  K 3.4* 3.3* 3.1* 3.4*  CL 93* 94* 91* 91*  CO2 30 30 27 30   GLUCOSE 114* 151* 85 91  BUN 30* 28* 30* 31*  CREATININE 1.30* 1.18* 1.35* 1.29*  CALCIUM 9.6 9.2 9.4 9.4   Liver Function Tests:  Recent Labs Lab 09/06/13 1825  AST 22  ALT 10  ALKPHOS 141*  BILITOT 0.5  PROT 7.0  ALBUMIN 4.1   No results found for this basename: LIPASE, AMYLASE,  in the last 168 hours No results found for this basename: AMMONIA,  in the last 168 hours CBC:  Recent Labs Lab 09/06/13 1825 09/07/13 0155 09/09/13 0557  WBC 8.9 7.0 6.5  NEUTROABS 6.1 5.5  --   HGB 10.2* 9.6* 9.7*  HCT 34.1* 33.0* 32.8*  MCV 65.3* 67.1* 64.8*  PLT 160 126* 136*   Cardiac Enzymes:  Recent Labs Lab 09/06/13 1912 09/07/13 0155 09/07/13 0747 09/07/13 1305  TROPONINI <0.30 <0.30 <0.30 <0.30   BNP (last 3 results)  Recent Labs  04/04/13 1618 06/24/13 1825 09/06/13 1825  PROBNP 2372.0* 2425.0* 2771.0*   CBG:  Recent Labs Lab  09/08/13 1231 09/08/13 1601 09/08/13 2249 09/09/13 0631 09/09/13 1119  GLUCAP 185* 196* 118* 94 161*    No results found for this or any previous visit (from the past 240 hour(s)).   Studies: No results found.  Scheduled Meds: . ALPRAZolam  0.5 mg Oral BID  . brimonidine  1 drop Both Eyes Daily  . docusate sodium  100 mg Oral BID  . enoxaparin (LOVENOX) injection  40 mg Subcutaneous Q24H  . ezetimibe  10 mg Oral QHS  . glipiZIDE  10 mg Oral BID AC  . insulin aspart  0-15 Units Subcutaneous  TID WC  . insulin aspart  0-5 Units Subcutaneous QHS  . ipratropium  0.5 mg Nebulization Once  . iron polysaccharides  150 mg Oral Daily  . latanoprost  1 drop Both Eyes QHS  . [START ON 09/11/2013] levofloxacin  750 mg Oral Q48H  . levothyroxine  150 mcg Oral QAC breakfast  . loratadine  10 mg Oral Daily  . metolazone  2.5 mg Oral Daily  . oxyCODONE-acetaminophen  1 tablet Oral BID  . pantoprazole  40 mg Oral BID  . polyethylene glycol  17 g Oral Daily  . potassium chloride SA  40 mEq Oral TID  . simvastatin  10 mg Oral q1800  . sodium chloride  3 mL Intravenous Q12H  . sodium chloride  3 mL Intravenous Q12H   Continuous Infusions: . furosemide (LASIX) infusion 10 mg/hr (09/09/13 1216)    Principal Problem:   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 - With Acute on Chronic exaverbation Active Problems:   Chronic respiratory failure with hypoxia   DM2 (diabetes mellitus, type 2)   Unspecified hypothyroidism   Normal coronary arteries at cath 2011   Obesity, morbid (more than 100 lbs over ideal weight or BMI > 40)   Pulmonary hypertension, Severe by Echo 02/1713-pa pressure 74 mmHg   Diastolic CHF, chronic- EF 65-70% May 2014   Acute blood loss anemia- transfused 10/14- negative endoscopy   PUD (peptic ulcer disease)- ASA stopped   CAP (community acquired pneumonia)   Pulmonary hypertension    Time spent: 35 minutes    [name]  [facility] Pager [phone]. If 7PM-7AM, please contact night-coverage at [email or web address], password [facility] 09/09/2013, 2:26 PM  LOS: 3 days

## 2013-09-10 LAB — GLUCOSE, CAPILLARY
GLUCOSE-CAPILLARY: 111 mg/dL — AB (ref 70–99)
Glucose-Capillary: 196 mg/dL — ABNORMAL HIGH (ref 70–99)
Glucose-Capillary: 132 mg/dL — ABNORMAL HIGH (ref 70–99)
Glucose-Capillary: 126 mg/dL — ABNORMAL HIGH (ref 70–99)

## 2013-09-10 LAB — BASIC METABOLIC PANEL
BUN: 29 mg/dL — ABNORMAL HIGH (ref 6–23)
CALCIUM: 9.6 mg/dL (ref 8.4–10.5)
CO2: 30 mEq/L (ref 19–32)
Chloride: 92 mEq/L — ABNORMAL LOW (ref 96–112)
Creatinine, Ser: 1.27 mg/dL — ABNORMAL HIGH (ref 0.50–1.10)
GFR, EST AFRICAN AMERICAN: 46 mL/min — AB (ref >90)
GFR, EST NON AFRICAN AMERICAN: 40 mL/min — AB (ref >90)
GLUCOSE: 116 mg/dL — AB (ref 70–99)
POTASSIUM: 3.5 meq/L — AB (ref 3.7–5.3)
SODIUM: 139 meq/L (ref 137–147)

## 2013-09-10 MED ORDER — METOLAZONE 2.5 MG PO TABS
2.5000 mg | ORAL_TABLET | Freq: Two times a day (BID) | ORAL | Status: DC
  Administered 2013-09-10 – 2013-09-15 (×11): 2.5 mg via ORAL
  Filled 2013-09-10 (×12): qty 1

## 2013-09-10 NOTE — Plan of Care (Addendum)
Problem: Limited Adherence to Nutrition-Related Recommendations (NB-1.6) Goal: Nutrition education Formal process to instruct or train a patient/client in a skill or to impart knowledge to help patients/clients voluntarily manage or modify food choices and eating behavior to maintain or improve health. Outcome: Completed/Met Date Met:  09/10/13  RD consulted for nutrition education regarding low sodium diet.   Provided "Low Sodium Nutrition Therapy" handout from the Academy of [employer] Dietetics. Patient described eating canned beans and vegetables, stating that she rinses the vegetables prior to cooking them. Patient reported making a lot of soups at home. She stated she tries to stay away from using a salt shaker except when she eats potatoes and cabbage.  Dietetic Intern discouraged consumption of processed foods and frozen dinners. Dietetic Intern encouraged choosing low sodium and no added salt canned vegetables. Patient inquired about "no salt" salt substitute. Dietetic Intern advised patient to consult with physician regarding usage due to high potassium content of the product.   Expect fair compliance.  BMI equals 41.02 kg/m2. Patient meets criteria for obesity class III. Current diet order is heart healthy and currently consuming 100% of meals at this time. Labs and medications reviewed. No nutrition interventions warranted at this time. If additional nutrition issues arise please re consult.  Claudell Kyle, Dietetic Intern Pager: [phone]  I agree with the above Dietetic Intern note and made appropriate revisions.  Arthur Holms, RD, LDN Pager #: [phone]

## 2013-09-10 NOTE — Progress Notes (Signed)
Patient: [name] / Admit Date: 09/06/2013 / Date of Encounter: 09/10/2013, 9:01 AM  [facility] Patient  Subjective  79 y.o. morbidly obese female with a history of right heart failure, severe pulmonary hypertension and obstructive sleep apnea, intolerant to CPAP. Wears oxygen 24/7 @ 4 liters. Had sleep stud 02/2012 no sleep apnea noted.  She is followed by Dr Halford Chessman. She has had multiple admissions this year for acute on chronic diastolic HF. Her last echo was on 072014, which demonstrated normal EF of 65-70% and mod LVH   Weights from the community 207 in October 2014 up to 232 in December.   Prior to admit weight trending up from 212 to 219 pounds. Torsemide increased to 100 mg daily but with poor response. She also had increased dyspnea on exertion and orthopnea. Only able to walk to bathroom. Uses wheel chair in store.   Yesterday she was switched to lasix drip and continue on Metolazone. Brisk diuresis. 24 hour I/O -2.8 liters.   SOB with exertion.    PFT 03/10/12>>FEV1 1.65 (100%), FEV1% 78, TLC 3.96 (95%), DLCO 53, no BD 09/08/13 ECHO EF 55-60% Peak PA pressure 67 RV severely dilated TSH 0.389 K 3.4 >3.5  Creatinine 1.29>1.27  Objective   Telemetry: NSR occ PACs,PVCs 118   Physical Exam: Blood pressure 105/70, pulse 99, temperature 97.9 F (36.6 C), temperature source Oral, resp. rate 18, height 5\' 1"  (1.549 m), weight 217 lb (98.431 kg), SpO2 95.00%. General: Well developed, well nourished WF in no acute distress.Sitting chair Head: Normocephalic, atraumatic, sclera non-icteric, no xanthomas, nares are without discharge. Neck:  JVP to jaw. Lungs: . No wheezes or rhonchi. Nonlabored. On 4 liters [place].  Heart: Tachycardic irregular. S1 S2 without murmurs, rubs, or gallops.  Abdomen: Soft, non-tender, non-distended with normoactive bowel sounds. No rebound/guarding. Extremities: No clubbing or cyanosis. R and LLE 3+ edema. Distal pedal pulses are 2+ and equal bilaterally. Neuro:  Alert and oriented X 3. Moves all extremities spontaneously. Psych:  Responds to questions appropriately with a normal affect.   Intake/Output Summary (Last 24 hours) at 09/10/13 0901 Last data filed at 09/10/13 0700  Gross per 24 hour  Intake    240 ml  Output   2652 ml  Net  -2412 ml    Inpatient Medications:  . ALPRAZolam  0.5 mg Oral BID  . brimonidine  1 drop Both Eyes Daily  . docusate sodium  100 mg Oral BID  . enoxaparin (LOVENOX) injection  40 mg Subcutaneous Q24H  . ezetimibe  10 mg Oral QHS  . glipiZIDE  10 mg Oral BID AC  . insulin aspart  0-15 Units Subcutaneous TID WC  . insulin aspart  0-5 Units Subcutaneous QHS  . ipratropium  0.5 mg Nebulization Once  . iron polysaccharides  150 mg Oral Daily  . latanoprost  1 drop Both Eyes QHS  . levothyroxine  150 mcg Oral QAC breakfast  . loratadine  10 mg Oral Daily  . metolazone  2.5 mg Oral Daily  . oxyCODONE-acetaminophen  1 tablet Oral BID  . pantoprazole  40 mg Oral BID  . polyethylene glycol  17 g Oral Daily  . potassium chloride SA  40 mEq Oral TID  . simvastatin  10 mg Oral q1800  . sodium chloride  3 mL Intravenous Q12H  . sodium chloride  3 mL Intravenous Q12H   Infusions:  . furosemide (LASIX) infusion 10 mg/hr (09/09/13 1216)    Labs:  Recent Labs  09/09/13 0557  09/10/13 0322  NA 135* 139  K 3.4* 3.5*  CL 91* 92*  CO2 30 30  GLUCOSE 91 116*  BUN 31* 29*  CREATININE 1.29* 1.27*  CALCIUM 9.4 9.6   No results found for this basename: AST, ALT, ALKPHOS, BILITOT, PROT, ALBUMIN,  in the last 72 hours  Recent Labs  09/09/13 0557  WBC 6.5  HGB 9.7*  HCT 32.8*  MCV 64.8*  PLT 136*    Recent Labs  09/07/13 1305  TROPONINI <0.30   No components found with this basename: POCBNP,  No results found for this basename: HGBA1C,  in the last 72 hours   Radiology/Studies:  Dg Chest Portable 1 View  09/06/2013   CLINICAL DATA:  Short of breath for 1 week. Worsening shortness of breath.  EXAM:  PORTABLE CHEST - 1 VIEW  COMPARISON:  DG CHEST 1V PORT dated 04/04/2013; DG CHEST 1V PORT dated 03/15/2013; DG CHEST 1V PORT dated 03/13/2013; DG CHEST 2 VIEW dated 03/08/2013  FINDINGS: Patient is rotated to the right. Oxygen tubing projects over the chest. The cardiopericardial silhouette appears enlarged. Pulmonary vascular congestion is present. There is a new right pleural effusion and right basilar atelectasis and partial collapse. Underlying airspace disease cannot be excluded. Mild left basilar atelectasis is present. Lucency is present in the retrocardiac area which is favored to be projectional although a hiatal hernia could produce this appearance as well.  IMPRESSION: New right pleural effusion and basilar collapse/consolidation. The appearance is similar to the prior exam 03/13/2013 and failure with asymmetric effusion and edema is favored. Pneumonia or aspiration or in the differential considerations.   Electronically Signed   By: Dereck Ligas M.D.   On: 09/06/2013 19:48      Assessment and Plan   1. Acute on chronic right heart failure/HFpEF due to severe pulmonary hypertension (PA pressure 29mmHg by echo 02/2013, normal cors 2011) 2. Acute renal insufficiency  3. Acute on chronic respiratory failure on 4 liters [place] chronically  4. Morbid obesity with OSA/OHS - Body mass index is 41.64 kg/(m^2). 5. CAP - ? aspiration 6. HTN 7. Peptic ulcer disease 8. Chronic anemia with thrombocytopenia this admission, microcytic, transfused 05/2013 with negative endoscopy. Per GI notes on report, it is unlikely she would tolerate a colonscopy and a SBE would likely not show any treatable lesions thus empiric rx was recommended. No longer on ASA. On PPI for PUD. 9. Diabetes mellitus 10. Hypothyroidism, TSH 0.389  Brisk diuresis overnight. Weight down 4 pounds.  Continue lasix drip and Metolazone. Increase metolazone to bid. Continue over the weekend unless creatinine bumps. Likely another 10-15 pounds to  go.  Renal function stable.   CLEGG,AMY NP-C 9:01 AM  Patient seen and examined with Darrick Grinder, NP. We discussed all aspects of the encounter. I agree with the assessment and plan as stated above.   Volume status improving. Renal function stable. Continue to diurese. I personally place TED hose on her today but she could not tolerate. Will need several more days of diuresis. Can consider RHC after diuresis but not sure it will change management much.   Benay Spice 9:33 AM

## 2013-09-10 NOTE — Progress Notes (Signed)
Occupational Therapy Note  Pt with limited endurance/activity tolerance.  Fatigues quickly.  Must sit for most ADLs.  Feel she likely needs SNF unless she has 24 hour assistance.    09/09/13 1539  OT Visit Information  Last OT Received On 09/10/13  Assistance Needed +1  History of Present Illness 2 week hx of shortness of breath, increased leg swelling. She has gained 10 lb in the past few days. Denies any chest pain. But has increased work of breathing. A home she is on 4 L of oxygen but required up to 6-8L  in ER to maintain Oxygen sats of 91% confirmed by ABG. Patient reports hx of COPD. Denies any fever or chills. Patient gt Lasix 40 mg IV in ER and had some diuresis. Feels better. CXR showing right pleural effusion and basilar collapse/consolidation  OT Time Calculation  OT Start Time 1519  OT Stop Time 1539  OT Time Calculation (min) 20 min  Precautions  Precautions Fall  Precaution Comments requires 02  ADL  Grooming Wash/dry hands;Wash/dry face;Brushing hair;Set up  Where Assessed - Grooming Supported sitting  Naval architect Method Sit to stand;Stand Optician, dispensing  Where Assessed - Best boy and Hygiene Sit to stand from 3-in-1 or toilet  Transfers/Ambulation Related to ADLs supervision with RW  ADL Comments Pt fatigues quickly with activity.  Today she reports this is her normal and she is weak.  pt must sit to perform most tasks as he does not have the activity tolerance to complete them   Cognition  Arousal/Alertness Awake/alert  Behavior During Therapy [facility] for tasks assessed/performed  Overall Cognitive Status Within Functional Limits for tasks assessed  Transfers  Transfers Sit to Stand;Stand to Sit  Sit to Stand With upper extremity assist;From chair/3-in-1;6: Modified independent (Device/Increase time)  Stand to  Sit 6: Modified independent (Device/Increase time);With upper extremity assist;To chair/3-in-1  OT - End of Session  Equipment Utilized During Treatment Rolling walker;Oxygen  Activity Tolerance Patient limited by fatigue  Patient left in chair;with call bell/phone within reach  Nurse Communication Mobility status  OT Assessment/Plan  OT Plan Discharge plan remains appropriate  OT Frequency Min 2X/week  Follow Up Recommendations SNF;Supervision/Assistance - 24 hour  OT Equipment None recommended by OT  OT Goal Progression  Progress towards OT goals Progressing toward goals (slowly)  Acute Rehab OT Goals  Potential to Achieve Goals Good  ADL Goals  Pt Will Perform Grooming with supervision;standing  Pt Will Perform Lower Body Bathing with supervision;sit to/from stand  Pt Will Perform Lower Body Dressing with supervision;sit to/from stand  Pt Will Transfer to Toilet with supervision;ambulating;regular height toilet;grab bars  Pt Will Perform Tub/Shower Transfer Tub transfer;with supervision;ambulating;rolling walker  Additional ADL Goal #1 Pt will be independent with energy conservation techniques   Lucille Passy, OTR/L [phone]

## 2013-09-10 NOTE — Progress Notes (Signed)
Physical Therapy Treatment Patient Details Name: [name] MRN: [id number] DOB: August 23, 1935 Today's Date: 09/10/2013 Time: 8469-6295 PT Time Calculation (min): 12 min  PT Assessment / Plan / Recommendation  History of Present Illness 2 week hx of shortness of breath, increased leg swelling. She has gained 10 lb in the past few days. Denies any chest pain. But has increased work of breathing. A home she is on 4 L of oxygen but required up to 6-8L  in ER to maintain Oxygen sats of 91% confirmed by ABG. Patient reports hx of COPD. Denies any fever or chills. Patient gt Lasix 40 mg IV in ER and had some diuresis. Feels better. CXR showing right pleural effusion and basilar collapse/consolidation   PT Comments   Patient making progress with mobility.  Continues to have decrease in O2 sats with ambulation on O2.  Follow Up Recommendations  Home health PT     Does the patient have the potential to tolerate intense rehabilitation     Barriers to Discharge        Equipment Recommendations  None recommended by PT    Recommendations for Other Services    Frequency Min 3X/week   Progress towards PT Goals Progress towards PT goals: Progressing toward goals  Plan Current plan remains appropriate    Precautions / Restrictions Precautions Precautions: Fall Precaution Comments: requires 02 Restrictions Weight Bearing Restrictions: No   Pertinent Vitals/Pain O2 sats dropped to 82% with ambulation of 30' on O2 at 5 l/min. O2 sats returned to 91% with 3 minute rest following ambulation.    Mobility  Transfers Overall transfer level: Needs assistance Equipment used: Rolling walker (2 wheeled) Transfers: Sit to/from Stand Sit to Stand: Supervision General transfer comment: Assist for safety Ambulation/Gait Ambulation/Gait assistance: Min guard Ambulation Distance (Feet): 30 Feet Assistive device: Rolling walker (2 wheeled) Gait Pattern/deviations: Step-through pattern;Decreased step  length - right;Decreased step length - left;Shuffle;Trunk flexed Gait velocity: Slow gait speed to minimize dyspnea General Gait Details: Patient with safe use of RW.  Dyspnea of 3/4 with ambulation.  Patient ambulated 30' on O2 at 5 l/min.  O2 sats dropped to 82%.  Returned to 91% in 3 minutes.    Exercises General Exercises - Upper Extremity Shoulder Flexion: AROM;Both;10 reps;Seated General Exercises - Lower Extremity Long Arc Quad: AROM;Both;5 reps;Seated Toe Raises: AROM;Both;10 reps;Seated Heel Raises: AROM;Both;10 reps;Seated   PT Diagnosis:    PT Problem List:   PT Treatment Interventions:     PT Goals (current goals can now be found in the care plan section)    Visit Information  Last PT Received On: 09/10/13 Assistance Needed: +1 History of Present Illness: 2 week hx of shortness of breath, increased leg swelling. She has gained 10 lb in the past few days. Denies any chest pain. But has increased work of breathing. A home she is on 4 L of oxygen but required up to 6-8L  in ER to maintain Oxygen sats of 91% confirmed by ABG. Patient reports hx of COPD. Denies any fever or chills. Patient gt Lasix 40 mg IV in ER and had some diuresis. Feels better. CXR showing right pleural effusion and basilar collapse/consolidation    Subjective Data  Subjective: "I'm better than when I came in to the hospital"   Cognition  Cognition Arousal/Alertness: Awake/alert Behavior During Therapy: [facility] for tasks assessed/performed Overall Cognitive Status: Within Functional Limits for tasks assessed    Balance     End of Session PT - End of Session  Equipment Utilized During Treatment: Gait belt;Oxygen Activity Tolerance: Patient limited by fatigue (Limited by dyspnea) Patient left: in chair;with call bell/phone within reach Nurse Communication: Mobility status   GP     Despina Pole 09/10/2013, 4:31 PM Carita Pian. Sanjuana Kava, [place] Pager [phone]

## 2013-09-10 NOTE — Progress Notes (Signed)
[facility] PROGRESS NOTE  [name] RXV:[id number] DOB: 11-Oct-1934 DOA: 09/06/2013 PCP: Glo Herring., MD  Assessment/Plan: 1. Acute on chronic CHF with preserved LVEF: Cardiology following. Transthoracic echocardiogram performed 09/08/13 showing ejection fraction 55-60% without wall motion abnormalities. Could not evaluate LV diastolic function. Pulmonary artery PA pressure 67 mmHg. Right ventricle severely dilated. She was started on a Lasix drip, diuresing 3.3 L, having a net -2.8 and 24 hours. Her weight has come down from 100.3 to 98.4 kg. Cardiology following, will continue Lasix drip over the weekend.  2. Acute renal failure. Creatinine remains stable at 1.27, little change from yesterday 1.29. We'll continue monitoring. 3. Acute on chronic respiratory failure. Likely secondary to decompensated heart failure. 4. Hypertension: Her blood pressures are stable, on Lasix drip now. 5. Obstructive sleep apnea. Patient refusing to wear CPAP yesterday evening.   6. DM2: blood sugars well controled. Continue with glipizide, stop standing insulin with meals. ssi sensitive. 7. PUD: PPI 8. Pulmonary hypertension  Code Status: partial DNI Family Communication: spoke with patient and daughter Disposition Plan: inpatient   Consultants:  cardiology  Procedures:  none  Antibiotics: levaquin 1/13>>  HPI/Subjective: Patient reports doing well, she is sitting comfortably  Objective: Filed Vitals:   09/10/13 1616  BP:   Pulse: 106  Temp:   Resp:     Intake/Output Summary (Last 24 hours) at 09/10/13 1707 Last data filed at 09/10/13 1100  Gross per 24 hour  Intake    360 ml  Output   2201 ml  Net  -1841 ml   Filed Weights   09/08/13 0500 09/09/13 0440 09/10/13 0500  Weight: 99.9 kg (220 lb 3.8 oz) 100.3 kg (221 lb 1.9 oz) 98.431 kg (217 lb)    Exam:   General:  NAD, resting in chair  Cardiovascular: RRR no mrg  Respiratory: bibasilar crackles to mid lung  fields, good air movement  Abdomen: BS+ soft nt  Musculoskeletal: 2+ edema bilaterally  Data Reviewed: Basic Metabolic Panel:  Recent Labs Lab 09/06/13 1825 09/07/13 0155 09/08/13 0419 09/09/13 0557 09/10/13 0322  NA 139 138 135* 135* 139  K 3.4* 3.3* 3.1* 3.4* 3.5*  CL 93* 94* 91* 91* 92*  CO2 30 30 27 30 30   GLUCOSE 114* 151* 85 91 116*  BUN 30* 28* 30* 31* 29*  CREATININE 1.30* 1.18* 1.35* 1.29* 1.27*  CALCIUM 9.6 9.2 9.4 9.4 9.6   Liver Function Tests:  Recent Labs Lab 09/06/13 1825  AST 22  ALT 10  ALKPHOS 141*  BILITOT 0.5  PROT 7.0  ALBUMIN 4.1   No results found for this basename: LIPASE, AMYLASE,  in the last 168 hours No results found for this basename: AMMONIA,  in the last 168 hours CBC:  Recent Labs Lab 09/06/13 1825 09/07/13 0155 09/09/13 0557  WBC 8.9 7.0 6.5  NEUTROABS 6.1 5.5  --   HGB 10.2* 9.6* 9.7*  HCT 34.1* 33.0* 32.8*  MCV 65.3* 67.1* 64.8*  PLT 160 126* 136*   Cardiac Enzymes:  Recent Labs Lab 09/06/13 1912 09/07/13 0155 09/07/13 0747 09/07/13 1305  TROPONINI <0.30 <0.30 <0.30 <0.30   BNP (last 3 results)  Recent Labs  04/04/13 1618 06/24/13 1825 09/06/13 1825  PROBNP 2372.0* 2425.0* 2771.0*   CBG:  Recent Labs Lab 09/09/13 1630 09/09/13 2124 09/10/13 0629 09/10/13 1115 09/10/13 1635  GLUCAP 171* 149* 111* 196* 132*    No results found for this or any previous visit (from the past 240 hour(s)).   Studies:  No results found.  Scheduled Meds: . ALPRAZolam  0.5 mg Oral BID  . brimonidine  1 drop Both Eyes Daily  . docusate sodium  100 mg Oral BID  . enoxaparin (LOVENOX) injection  40 mg Subcutaneous Q24H  . ezetimibe  10 mg Oral QHS  . glipiZIDE  10 mg Oral BID AC  . insulin aspart  0-15 Units Subcutaneous TID WC  . insulin aspart  0-5 Units Subcutaneous QHS  . ipratropium  0.5 mg Nebulization Once  . iron polysaccharides  150 mg Oral Daily  . latanoprost  1 drop Both Eyes QHS  . levothyroxine   150 mcg Oral QAC breakfast  . loratadine  10 mg Oral Daily  . metolazone  2.5 mg Oral BID  . oxyCODONE-acetaminophen  1 tablet Oral BID  . pantoprazole  40 mg Oral BID  . polyethylene glycol  17 g Oral Daily  . potassium chloride SA  40 mEq Oral TID  . simvastatin  10 mg Oral q1800  . sodium chloride  3 mL Intravenous Q12H  . sodium chloride  3 mL Intravenous Q12H   Continuous Infusions: . 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 - With Acute on Chronic exaverbation Active Problems:   Chronic respiratory failure with hypoxia   DM2 (diabetes mellitus, type 2)   Unspecified hypothyroidism   Normal coronary arteries at cath 2011   Obesity, morbid (more than 100 lbs over ideal weight or BMI > 40)   Pulmonary hypertension, Severe by Echo 02/1713-pa pressure 74 mmHg   Diastolic CHF, chronic- EF 65-70% May 2014   Acute blood loss anemia- transfused 10/14- negative endoscopy   PUD (peptic ulcer disease)- ASA stopped   CAP (community acquired pneumonia)   Pulmonary hypertension    Time spent: 25 minutes    [name]  [facility] Pager [phone]. If 7PM-7AM, please contact night-coverage at [email or web address], password [facility] 09/10/2013, 5:07 PM  LOS: 4 days

## 2013-09-10 NOTE — Care Management Note (Signed)
    Page 1 of 2   09/15/2013     4:44:35 PM   CARE MANAGEMENT NOTE 09/15/2013  Patient:  [name], [name]   Account Number:  [id number]  Date Initiated:  09/10/2013  Documentation initiated by:  Gonsalo Cuthbertson  Subjective/Objective Assessment:   PT ADM ON 09/06/13 WITH CHF EXACERBATION.  PTA, PT RESIDES AT [place].  SHE IS ACTIVE WITH [facility] CASE MGMT.  SHE IS ON CHRONIC HOME OXYGEN.     Action/Plan:   WOULD BENEFIT FROM [facility] FOR DISEASE MGMT, HHPT AT DC.  WILL FOLLOW FOR ORDERS, ADDITIONAL HOME NEEDS.   Anticipated DC Date:  09/15/2013   Anticipated DC Plan:  [place]  CM consult      [facility] Choice  HOME HEALTH   Choice offered to / List presented to:  C-1 Patient        [place] arranged  HH-1 RN  HH-10 DISEASE MANAGEMENT  HH-2 PT  HH-3 OT      Status of service:  Completed, signed off Medicare Important Message given?   (If response is "NO", the following Medicare IM given date fields will be blank) Date Medicare IM given:   Date Additional Medicare IM given:    Discharge Disposition:  [place]  Per UR Regulation:  Reviewed for med. necessity/level of care/duration of stay  If discussed at [place] of Stay Meetings, dates discussed:   09/14/2013    Comments:  09/15/13 Nasiah Lehenbauer,RN,BSN [phone] PT [place].  REFERRAL TO [facility] FOR HH FOLLOW UP.  START OF CARE 24-48H POST DC DATE.  NOTIFIED [facility] CASE MGR OF DC TODAY.

## 2013-09-11 DIAGNOSIS — I5032 Chronic diastolic (congestive) heart failure: Secondary | ICD-10-CM

## 2013-09-11 DIAGNOSIS — R5381 Other malaise: Secondary | ICD-10-CM

## 2013-09-11 DIAGNOSIS — R5383 Other fatigue: Secondary | ICD-10-CM

## 2013-09-11 LAB — BASIC METABOLIC PANEL
BUN: 29 mg/dL — ABNORMAL HIGH (ref 6–23)
CHLORIDE: 92 meq/L — AB (ref 96–112)
CO2: 30 meq/L (ref 19–32)
Calcium: 9.5 mg/dL (ref 8.4–10.5)
Creatinine, Ser: 1.23 mg/dL — ABNORMAL HIGH (ref 0.50–1.10)
GFR calc Af Amer: 48 mL/min — ABNORMAL LOW (ref >90)
GFR calc non Af Amer: 42 mL/min — ABNORMAL LOW (ref >90)
Glucose, Bld: 106 mg/dL — ABNORMAL HIGH (ref 70–99)
Potassium: 4.7 mEq/L (ref 3.7–5.3)
SODIUM: 137 meq/L (ref 137–147)

## 2013-09-11 LAB — GLUCOSE, CAPILLARY
GLUCOSE-CAPILLARY: 111 mg/dL — AB (ref 70–99)
GLUCOSE-CAPILLARY: 125 mg/dL — AB (ref 70–99)
Glucose-Capillary: 167 mg/dL — ABNORMAL HIGH (ref 70–99)

## 2013-09-11 NOTE — Progress Notes (Signed)
Spoke with pt about wearing CPAP tonight. She continues to refuse and states she has claustrophobia. Advised pt if she decides she wants to try CPAP, to notify RN and RT will set up. RT will continue to monitor.

## 2013-09-11 NOTE — Progress Notes (Signed)
[facility] PROGRESS NOTE  [name] GUR:[id number] DOB: [birth year] DOA: 09/06/2013 PCP: Glo Herring., MD  Assessment/Plan: 1. Acute on chronic CHF with preserved LVEF: Cardiology following. She remains on a Lasix drip. She has a negative net fluid balance of 2.0 L in the last 24 hours. Her weight continues to come down from 98.4-97 kg. Cardiology recommending continuing Lasix drip over the weekend. Kidney function remains stable as a.m. lab work shows creatinine 1.23 with BUN of 29.  2. Acute renal failure. Creatinine remains stable, remains a Lasix drip. We'll continue monitoring 3. Acute on chronic respiratory failure. Likely secondary to decompensated heart failure. 4. Hypertension: Her blood pressures are stable, on Lasix drip now. 5. Obstructive sleep apnea. Patient refusing to wear CPAP   6. DM2: blood sugars well controled. Continue with glipizide, stop standing insulin with meals. ssi sensitive. 7. PUD: PPI 8. Pulmonary hypertension  Code Status: partial DNI Disposition Plan: inpatient   Consultants:  cardiology  Procedures:  none  Antibiotics: levaquin 1/13>>  HPI/Subjective: Patient sitting comfortably at bedside. She states feeling well, asking when she can go home. She is tolerating by mouth intake, denies nausea vomiting fevers or chills. She remains a Lasix drip, having good diuresis.  Objective: Filed Vitals:   09/11/13 1353  BP: 99/65  Pulse: 102  Temp: 97.9 F (36.6 C)  Resp: 19    Intake/Output Summary (Last 24 hours) at 09/11/13 1407 Last data filed at 09/11/13 1300  Gross per 24 hour  Intake    240 ml  Output   1925 ml  Net  -1685 ml   Filed Weights   09/09/13 0440 09/10/13 0500 09/11/13 0621  Weight: 100.3 kg (221 lb 1.9 oz) 98.431 kg (217 lb) 97.2 kg (214 lb 4.6 oz)    Exam:   General:  NAD, resting in chair  Cardiovascular: RRR no mrg  Respiratory: bibasilar crackles to mid lung fields, good air  movement  Abdomen: BS+ soft nt  Musculoskeletal: 2+ edema bilaterally  Data Reviewed: Basic Metabolic Panel:  Recent Labs Lab 09/07/13 0155 09/08/13 0419 09/09/13 0557 09/10/13 0322 09/11/13 0312  NA 138 135* 135* 139 137  K 3.3* 3.1* 3.4* 3.5* 4.7  CL 94* 91* 91* 92* 92*  CO2 30 27 30 30 30   GLUCOSE 151* 85 91 116* 106*  BUN 28* 30* 31* 29* 29*  CREATININE 1.18* 1.35* 1.29* 1.27* 1.23*  CALCIUM 9.2 9.4 9.4 9.6 9.5   Liver Function Tests:  Recent Labs Lab 09/06/13 1825  AST 22  ALT 10  ALKPHOS 141*  BILITOT 0.5  PROT 7.0  ALBUMIN 4.1   No results found for this basename: LIPASE, AMYLASE,  in the last 168 hours No results found for this basename: AMMONIA,  in the last 168 hours CBC:  Recent Labs Lab 09/06/13 1825 09/07/13 0155 09/09/13 0557  WBC 8.9 7.0 6.5  NEUTROABS 6.1 5.5  --   HGB 10.2* 9.6* 9.7*  HCT 34.1* 33.0* 32.8*  MCV 65.3* 67.1* 64.8*  PLT 160 126* 136*   Cardiac Enzymes:  Recent Labs Lab 09/06/13 1912 09/07/13 0155 09/07/13 0747 09/07/13 1305  TROPONINI <0.30 <0.30 <0.30 <0.30   BNP (last 3 results)  Recent Labs  04/04/13 1618 06/24/13 1825 09/06/13 1825  PROBNP 2372.0* 2425.0* 2771.0*   CBG:  Recent Labs Lab 09/10/13 0629 09/10/13 1115 09/10/13 1635 09/10/13 2129 09/11/13 0624  GLUCAP 111* 196* 132* 126* 111*    No results found for this or any previous visit (from  the past 240 hour(s)).   Studies: No results found.  Scheduled Meds: . ALPRAZolam  0.5 mg Oral BID  . brimonidine  1 drop Both Eyes Daily  . docusate sodium  100 mg Oral BID  . enoxaparin (LOVENOX) injection  40 mg Subcutaneous Q24H  . ezetimibe  10 mg Oral QHS  . glipiZIDE  10 mg Oral BID AC  . insulin aspart  0-15 Units Subcutaneous TID WC  . insulin aspart  0-5 Units Subcutaneous QHS  . ipratropium  0.5 mg Nebulization Once  . iron polysaccharides  150 mg Oral Daily  . latanoprost  1 drop Both Eyes QHS  . levothyroxine  150 mcg Oral QAC  breakfast  . loratadine  10 mg Oral Daily  . metolazone  2.5 mg Oral BID  . oxyCODONE-acetaminophen  1 tablet Oral BID  . pantoprazole  40 mg Oral BID  . polyethylene glycol  17 g Oral Daily  . potassium chloride SA  40 mEq Oral TID  . simvastatin  10 mg Oral q1800  . sodium chloride  3 mL Intravenous Q12H  . sodium chloride  3 mL Intravenous Q12H   Continuous Infusions: . 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 - With Acute on Chronic exaverbation Active Problems:   Chronic respiratory failure with hypoxia   DM2 (diabetes mellitus, type 2)   Unspecified hypothyroidism   Normal coronary arteries at cath 2011   Obesity, morbid (more than 100 lbs over ideal weight or BMI > 40)   Pulmonary hypertension, Severe by Echo 02/1713-pa pressure 74 mmHg   Diastolic CHF, chronic- EF 65-70% May 2014   Acute blood loss anemia- transfused 10/14- negative endoscopy   PUD (peptic ulcer disease)- ASA stopped   CAP (community acquired pneumonia)   Pulmonary hypertension    Time spent: 25 minutes    [name]  [facility] Pager [phone]. If 7PM-7AM, please contact night-coverage at [email or web address], password [facility] 09/11/2013, 2:07 PM  LOS: 5 days

## 2013-09-11 NOTE — Progress Notes (Signed)
Resp Care Note;Pt has not changed mind about wearing her Cpap.Still refusing.

## 2013-09-11 NOTE — Progress Notes (Signed)
Primary cardiologist: Dr. Glori Bickers  Subjective:    Up in chair, continues to feel better, less abdominal bloating.  Objective:   Temp:  [97.9 F (36.6 C)] 97.9 F (36.6 C) (01/17 0621) Pulse Rate:  [80-110] 97 (01/17 0621) Resp:  [18] 18 (01/17 0621) BP: (109-164)/(54-94) 109/54 mmHg (01/17 0621) SpO2:  [82 %-96 %] 94 % (01/17 0621) Weight:  [214 lb 4.6 oz (97.2 kg)] 214 lb 4.6 oz (97.2 kg) (01/17 0621) Last BM Date: 09/09/13  Filed Weights   09/09/13 0440 09/10/13 0500 09/11/13 4650  Weight: 221 lb 1.9 oz (100.3 kg) 217 lb (98.431 kg) 214 lb 4.6 oz (97.2 kg)    Intake/Output Summary (Last 24 hours) at 09/11/13 1025 Last data filed at 09/11/13 0700  Gross per 24 hour  Intake    120 ml  Output   2276 ml  Net  -2156 ml    Telemetry: Sinus rhythm, sinus tachycardia, occasional PVCs.  Exam:  General: No distress.  Lungs: Decreased breath sounds, nonlabored.  Cardiac: RRR, no gallop.  Abdomen: Protuberant, soft.  Extremities: 2-3+ edema.   Lab Results:  Basic Metabolic Panel:  Recent Labs Lab 09/09/13 0557 09/10/13 0322 09/11/13 0312  NA 135* 139 137  K 3.4* 3.5* 4.7  CL 91* 92* 92*  CO2 30 30 30   GLUCOSE 91 116* 106*  BUN 31* 29* 29*  CREATININE 1.29* 1.27* 1.23*  CALCIUM 9.4 9.6 9.5    Liver Function Tests:  Recent Labs Lab 09/06/13 1825  AST 22  ALT 10  ALKPHOS 141*  BILITOT 0.5  PROT 7.0  ALBUMIN 4.1    CBC:  Recent Labs Lab 09/06/13 1825 09/07/13 0155 09/09/13 0557  WBC 8.9 7.0 6.5  HGB 10.2* 9.6* 9.7*  HCT 34.1* 33.0* 32.8*  MCV 65.3* 67.1* 64.8*  PLT 160 126* 136*    Echocardiogram (1/14): Study Conclusions  - Left ventricle: Systolic function was normal. The estimated ejection fraction was in the range of 55% to 60%. Wall motion was normal; there were no regional wall motion abnormalities. Due to tachycardia, there was fusion of early and atrial contributions to ventricular filling. The study is not  technically sufficient to allow evaluation of LV diastolic function. - Ventricular septum: The contour showed systolic flattening. These changes are consistent with RV pressure overload. - Aortic valve: Trivial regurgitation. - Right ventricle: The cavity size was severely dilated. Systolic function was severely reduced. - Right atrium: The atrium was mildly dilated. - Tricuspid valve: Moderate regurgitation. - Pulmonary arteries: Systolic pressure was moderately to severely increased. PA peak pressure: 26mm Hg (S).   Medications:   Scheduled Medications: . ALPRAZolam  0.5 mg Oral BID  . brimonidine  1 drop Both Eyes Daily  . docusate sodium  100 mg Oral BID  . enoxaparin (LOVENOX) injection  40 mg Subcutaneous Q24H  . ezetimibe  10 mg Oral QHS  . glipiZIDE  10 mg Oral BID AC  . insulin aspart  0-15 Units Subcutaneous TID WC  . insulin aspart  0-5 Units Subcutaneous QHS  . ipratropium  0.5 mg Nebulization Once  . iron polysaccharides  150 mg Oral Daily  . latanoprost  1 drop Both Eyes QHS  . levothyroxine  150 mcg Oral QAC breakfast  . loratadine  10 mg Oral Daily  . metolazone  2.5 mg Oral BID  . oxyCODONE-acetaminophen  1 tablet Oral BID  . pantoprazole  40 mg Oral BID  . polyethylene glycol  17 g Oral  Daily  . potassium chloride SA  40 mEq Oral TID  . simvastatin  10 mg Oral q1800  . sodium chloride  3 mL Intravenous Q12H  . sodium chloride  3 mL Intravenous Q12H     Infusions: . furosemide (LASIX) infusion 10 mg/hr (09/10/13 1132)     PRN Medications:  sodium chloride, sodium chloride, acetaminophen, bisacodyl, ondansetron (ZOFRAN) IV, sodium chloride, sodium chloride   Assessment:   1. Acute on chronic right heart failure/HFpEF due to severe pulmonary hypertension (PA pressure 58mmHg by echo 02/2013, normal cors 2011). Diuresis continues on Lasix infusion.  2. Acute renal insufficiency, creatinine stable at 1.2.  3. Acute on chronic respiratory failure on 4  liters [place] chronically.  4. Morbid obesity with OSA/OHS - Body mass index is 41.64 kg/(m^2).   5. CAP - ? Aspiration.  6. HTN. Blood pressure better controlled this morning.  7. Peptic ulcer disease.   8. Chronic anemia with thrombocytopenia this admission, microcytic, transfused 05/2013 with negative endoscopy. Per GI notes on report, it is unlikely she would tolerate a colonscopy and a SBE would likely not show any treatable lesions thus empiric rx was recommended. No longer on ASA. On PPI for PUD.   9. Diabetes mellitus   10. Hypothyroidism, TSH 0.389.   Plan/Discussion:    Continue current regimen with plan to diurese over the weekend on Lasix infusion. Creatinine has been stable.   Satira Sark, M.D., F.A.C.C.

## 2013-09-12 LAB — CBC
HCT: 34.3 % — ABNORMAL LOW (ref 36.0–46.0)
HEMOGLOBIN: 10.2 g/dL — AB (ref 12.0–15.0)
MCH: 19.2 pg — ABNORMAL LOW (ref 26.0–34.0)
MCHC: 29.7 g/dL — AB (ref 30.0–36.0)
MCV: 64.7 fL — ABNORMAL LOW (ref 78.0–100.0)
Platelets: 144 10*3/uL — ABNORMAL LOW (ref 150–400)
RBC: 5.3 MIL/uL — ABNORMAL HIGH (ref 3.87–5.11)
RDW: 18.3 % — ABNORMAL HIGH (ref 11.5–15.5)
WBC: 5.8 10*3/uL (ref 4.0–10.5)

## 2013-09-12 LAB — GLUCOSE, CAPILLARY
GLUCOSE-CAPILLARY: 103 mg/dL — AB (ref 70–99)
Glucose-Capillary: 174 mg/dL — ABNORMAL HIGH (ref 70–99)
Glucose-Capillary: 84 mg/dL (ref 70–99)
Glucose-Capillary: 213 mg/dL — ABNORMAL HIGH (ref 70–99)
Glucose-Capillary: 197 mg/dL — ABNORMAL HIGH (ref 70–99)

## 2013-09-12 LAB — BASIC METABOLIC PANEL
BUN: 30 mg/dL — AB (ref 6–23)
CO2: 34 mEq/L — ABNORMAL HIGH (ref 19–32)
Calcium: 9.5 mg/dL (ref 8.4–10.5)
Chloride: 89 mEq/L — ABNORMAL LOW (ref 96–112)
Creatinine, Ser: 1.25 mg/dL — ABNORMAL HIGH (ref 0.50–1.10)
GFR, EST AFRICAN AMERICAN: 47 mL/min — AB (ref >90)
GFR, EST NON AFRICAN AMERICAN: 41 mL/min — AB (ref >90)
Glucose, Bld: 80 mg/dL (ref 70–99)
Potassium: 3.2 mEq/L — ABNORMAL LOW (ref 3.7–5.3)
Sodium: 137 mEq/L (ref 137–147)

## 2013-09-12 MED ORDER — POTASSIUM CHLORIDE CRYS ER 20 MEQ PO TBCR
40.0000 meq | EXTENDED_RELEASE_TABLET | Freq: Once | ORAL | Status: AC
  Administered 2013-09-12: 40 meq via ORAL
  Filled 2013-09-12: qty 2

## 2013-09-12 NOTE — Progress Notes (Signed)
Primary cardiologist: Dr. Glori Bickers  Subjective:    Still feeling better by the day, less abdominal bloating.  Objective:   Temp:  [97.9 F (36.6 C)-98.2 F (36.8 C)] 98.1 F (36.7 C) (01/18 0449) Pulse Rate:  [83-102] 83 (01/18 0449) Resp:  [17-19] 18 (01/18 0449) BP: (99-121)/(65-77) 108/66 mmHg (01/18 0449) SpO2:  [91 %-92 %] 91 % (01/18 0449) Weight:  [211 lb 4.8 oz (95.845 kg)] 211 lb 4.8 oz (95.845 kg) (01/18 0449) Last BM Date: 09/11/13  Filed Weights   09/10/13 0500 09/11/13 0621 09/12/13 0449  Weight: 217 lb (98.431 kg) 214 lb 4.6 oz (97.2 kg) 211 lb 4.8 oz (95.845 kg)    Intake/Output Summary (Last 24 hours) at 09/12/13 0853 Last data filed at 09/12/13 0452  Gross per 24 hour  Intake    120 ml  Output   1200 ml  Net  -1080 ml    Telemetry: Sinus rhythm, sinus tachycardia, occasional PVCs.  Exam:  General: No distress.  Lungs: Decreased breath sounds, nonlabored.  Cardiac: RRR, no gallop.  Abdomen: Protuberant, soft.  Extremities: 2-3+ edema.   Lab Results:  Basic Metabolic Panel:  Recent Labs Lab 09/10/13 0322 09/11/13 0312 09/12/13 0503  NA 139 137 137  K 3.5* 4.7 3.2*  CL 92* 92* 89*  CO2 30 30 34*  GLUCOSE 116* 106* 80  BUN 29* 29* 30*  CREATININE 1.27* 1.23* 1.25*  CALCIUM 9.6 9.5 9.5    Liver Function Tests:  Recent Labs Lab 09/06/13 1825  AST 22  ALT 10  ALKPHOS 141*  BILITOT 0.5  PROT 7.0  ALBUMIN 4.1    CBC:  Recent Labs Lab 09/07/13 0155 09/09/13 0557 09/12/13 0503  WBC 7.0 6.5 5.8  HGB 9.6* 9.7* 10.2*  HCT 33.0* 32.8* 34.3*  MCV 67.1* 64.8* 64.7*  PLT 126* 136* 144*    Echocardiogram (1/14): Study Conclusions  - Left ventricle: Systolic function was normal. The estimated ejection fraction was in the range of 55% to 60%. Wall motion was normal; there were no regional wall motion abnormalities. Due to tachycardia, there was fusion of early and atrial contributions to ventricular  filling. The study is not technically sufficient to allow evaluation of LV diastolic function. - Ventricular septum: The contour showed systolic flattening. These changes are consistent with RV pressure overload. - Aortic valve: Trivial regurgitation. - Right ventricle: The cavity size was severely dilated. Systolic function was severely reduced. - Right atrium: The atrium was mildly dilated. - Tricuspid valve: Moderate regurgitation. - Pulmonary arteries: Systolic pressure was moderately to severely increased. PA peak pressure: 84mm Hg (S).   Medications:   Scheduled Medications: . ALPRAZolam  0.5 mg Oral BID  . brimonidine  1 drop Both Eyes Daily  . docusate sodium  100 mg Oral BID  . enoxaparin (LOVENOX) injection  40 mg Subcutaneous Q24H  . ezetimibe  10 mg Oral QHS  . glipiZIDE  10 mg Oral BID AC  . insulin aspart  0-15 Units Subcutaneous TID WC  . insulin aspart  0-5 Units Subcutaneous QHS  . ipratropium  0.5 mg Nebulization Once  . iron polysaccharides  150 mg Oral Daily  . latanoprost  1 drop Both Eyes QHS  . levothyroxine  150 mcg Oral QAC breakfast  . loratadine  10 mg Oral Daily  . metolazone  2.5 mg Oral BID  . oxyCODONE-acetaminophen  1 tablet Oral BID  . pantoprazole  40 mg Oral BID  . polyethylene glycol  17 g Oral Daily  . potassium chloride SA  40 mEq Oral TID  . potassium chloride  40 mEq Oral Once  . simvastatin  10 mg Oral q1800  . sodium chloride  3 mL Intravenous Q12H  . sodium chloride  3 mL Intravenous Q12H    Infusions: . furosemide (LASIX) infusion 10 mg/hr (09/10/13 1132)    PRN Medications: sodium chloride, sodium chloride, acetaminophen, bisacodyl, ondansetron (ZOFRAN) IV, sodium chloride, sodium chloride   Assessment:   1. Acute on chronic right heart failure/HFpEF due to severe pulmonary hypertension (PA pressure 75mmHg by echo 02/2013, normal cors 2011). Diuresis continues on Lasix infusion.  2. Acute renal insufficiency, creatinine  stable at 1.2.  3. Acute on chronic respiratory failure on 4 liters [place] chronically.  4. Morbid obesity with OSA/OHS - Body mass index is 41.64 kg/(m^2).   5. CAP - ? Aspiration.  6. HTN. Better controlled.  7. Peptic ulcer disease.   8. Chronic anemia with thrombocytopenia this admission, microcytic, transfused 05/2013 with negative endoscopy. Per GI notes on report, it is unlikely she would tolerate a colonscopy and a SBE would likely not show any treatable lesions thus empiric rx was recommended. No longer on ASA. On PPI for PUD.   9. Diabetes mellitus    Plan/Discussion:    The patient continued to improve clinically, good diuresis on Lasix infusion. Continue same for now. Renal function is steady.   Satira Sark, M.D., F.A.C.C.

## 2013-09-12 NOTE — Progress Notes (Signed)
Pt. Refused cpap. Pt. States she does not wear cpap. RT informed pt. To notify if she changes her mind.

## 2013-09-12 NOTE — Progress Notes (Signed)
[facility] PROGRESS NOTE  [name] NLG:[id number] DOB: 1935-02-07 DOA: 09/06/2013 PCP: Glo Herring., MD  Assessment/Plan: 1. Acute on chronic CHF with preserved LVEF: Cardiology following. She remains on a Lasix drip. She has a negative net fluid balance of 1.0 L in the last 24 hours. Her weight continues to come down from 97 kg to 95.8kg today. Cardiology recommending continuing Lasix drip over the weekend. Kidney function remains stable as a.m. lab work shows creatinine 1.25 with BUN of 30.  2. Acute renal failure. Creatinine remains stable, remains a Lasix drip. Will continue monitoring 3. Acute on chronic respiratory failure. Likely secondary to decompensated heart failure. 4. Hypertension: Her blood pressures are stable, on Lasix drip now. 5. Obstructive sleep apnea. Patient refusing to wear CPAP   6. DM2: blood sugars well controled. Continue with glipizide, stop standing insulin with meals. ssi sensitive. 7. PUD: PPI 8. Pulmonary hypertension  Code Status: partial DNI Disposition Plan: Anticipate discharge in the next 24-48 hours.    Consultants:  cardiology  Procedures:  none  Antibiotics: levaquin 1/13>>  HPI/Subjective: She has no complaints for me today, resting comfortably. Remains on Lasix drip.  Objective: Filed Vitals:   09/12/13 1400  BP: 104/66  Pulse: 100  Temp: 97.7 F (36.5 C)  Resp: 20    Intake/Output Summary (Last 24 hours) at 09/12/13 1417 Last data filed at 09/12/13 1100  Gross per 24 hour  Intake      0 ml  Output   1850 ml  Net  -1850 ml   Filed Weights   09/10/13 0500 09/11/13 0621 09/12/13 0449  Weight: 98.431 kg (217 lb) 97.2 kg (214 lb 4.6 oz) 95.845 kg (211 lb 4.8 oz)    Exam:   General:  NAD, resting in chair  Cardiovascular: RRR no mrg  Respiratory: bibasilar crackles to mid lung fields, good air movement  Abdomen: BS+ soft nt  Musculoskeletal: 2+ edema bilaterally, improving  Data  Reviewed: Basic Metabolic Panel:  Recent Labs Lab 09/08/13 0419 09/09/13 0557 09/10/13 0322 09/11/13 0312 09/12/13 0503  NA 135* 135* 139 137 137  K 3.1* 3.4* 3.5* 4.7 3.2*  CL 91* 91* 92* 92* 89*  CO2 27 30 30 30  34*  GLUCOSE 85 91 116* 106* 80  BUN 30* 31* 29* 29* 30*  CREATININE 1.35* 1.29* 1.27* 1.23* 1.25*  CALCIUM 9.4 9.4 9.6 9.5 9.5   Liver Function Tests:  Recent Labs Lab 09/06/13 1825  AST 22  ALT 10  ALKPHOS 141*  BILITOT 0.5  PROT 7.0  ALBUMIN 4.1   No results found for this basename: LIPASE, AMYLASE,  in the last 168 hours No results found for this basename: AMMONIA,  in the last 168 hours CBC:  Recent Labs Lab 09/06/13 1825 09/07/13 0155 09/09/13 0557 09/12/13 0503  WBC 8.9 7.0 6.5 5.8  NEUTROABS 6.1 5.5  --   --   HGB 10.2* 9.6* 9.7* 10.2*  HCT 34.1* 33.0* 32.8* 34.3*  MCV 65.3* 67.1* 64.8* 64.7*  PLT 160 126* 136* 144*   Cardiac Enzymes:  Recent Labs Lab 09/06/13 1912 09/07/13 0155 09/07/13 0747 09/07/13 1305  TROPONINI <0.30 <0.30 <0.30 <0.30   BNP (last 3 results)  Recent Labs  04/04/13 1618 06/24/13 1825 09/06/13 1825  PROBNP 2372.0* 2425.0* 2771.0*   CBG:  Recent Labs Lab 09/11/13 0624 09/11/13 1607 09/11/13 2139 09/12/13 0552 09/12/13 1128  GLUCAP 111* 167* 125* 84 213*    No results found for this or any previous visit (  from the past 240 hour(s)).   Studies: No results found.  Scheduled Meds: . ALPRAZolam  0.5 mg Oral BID  . brimonidine  1 drop Both Eyes Daily  . docusate sodium  100 mg Oral BID  . enoxaparin (LOVENOX) injection  40 mg Subcutaneous Q24H  . ezetimibe  10 mg Oral QHS  . glipiZIDE  10 mg Oral BID AC  . insulin aspart  0-15 Units Subcutaneous TID WC  . insulin aspart  0-5 Units Subcutaneous QHS  . ipratropium  0.5 mg Nebulization Once  . iron polysaccharides  150 mg Oral Daily  . latanoprost  1 drop Both Eyes QHS  . levothyroxine  150 mcg Oral QAC breakfast  . loratadine  10 mg Oral  Daily  . metolazone  2.5 mg Oral BID  . oxyCODONE-acetaminophen  1 tablet Oral BID  . pantoprazole  40 mg Oral BID  . polyethylene glycol  17 g Oral Daily  . potassium chloride SA  40 mEq Oral TID  . simvastatin  10 mg Oral q1800  . sodium chloride  3 mL Intravenous Q12H  . sodium chloride  3 mL Intravenous Q12H   Continuous Infusions: . 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 - With Acute on Chronic exaverbation Active Problems:   Chronic respiratory failure with hypoxia   DM2 (diabetes mellitus, type 2)   Unspecified hypothyroidism   Normal coronary arteries at cath 2011   Obesity, morbid (more than 100 lbs over ideal weight or BMI > 40)   Pulmonary hypertension, Severe by Echo 02/1713-pa pressure 74 mmHg   Diastolic CHF, chronic- EF 65-70% May 2014   Acute blood loss anemia- transfused 10/14- negative endoscopy   PUD (peptic ulcer disease)- ASA stopped   CAP (community acquired pneumonia)   Pulmonary hypertension    Time spent: 25 minutes    [name]  [facility] Pager [phone]. If 7PM-7AM, please contact night-coverage at [email or web address], password [facility] 09/12/2013, 2:17 PM  LOS: 6 days

## 2013-09-13 ENCOUNTER — Other Ambulatory Visit (HOSPITAL_COMMUNITY): Payer: Medicare Other

## 2013-09-13 DIAGNOSIS — I5033 Acute on chronic diastolic (congestive) heart failure: Secondary | ICD-10-CM

## 2013-09-13 LAB — GLUCOSE, CAPILLARY
GLUCOSE-CAPILLARY: 106 mg/dL — AB (ref 70–99)
GLUCOSE-CAPILLARY: 181 mg/dL — AB (ref 70–99)
GLUCOSE-CAPILLARY: 146 mg/dL — AB (ref 70–99)

## 2013-09-13 LAB — BASIC METABOLIC PANEL
BUN: 32 mg/dL — AB (ref 6–23)
CHLORIDE: 88 meq/L — AB (ref 96–112)
CO2: 34 mEq/L — ABNORMAL HIGH (ref 19–32)
Calcium: 9.9 mg/dL (ref 8.4–10.5)
Creatinine, Ser: 1.35 mg/dL — ABNORMAL HIGH (ref 0.50–1.10)
GFR calc non Af Amer: 37 mL/min — ABNORMAL LOW (ref >90)
GFR, EST AFRICAN AMERICAN: 43 mL/min — AB (ref >90)
Glucose, Bld: 128 mg/dL — ABNORMAL HIGH (ref 70–99)
POTASSIUM: 3.3 meq/L — AB (ref 3.7–5.3)
Sodium: 139 mEq/L (ref 137–147)

## 2013-09-13 MED ORDER — POTASSIUM CHLORIDE CRYS ER 20 MEQ PO TBCR
40.0000 meq | EXTENDED_RELEASE_TABLET | Freq: Once | ORAL | Status: AC
  Administered 2013-09-13: 40 meq via ORAL

## 2013-09-13 NOTE — Progress Notes (Addendum)
[facility] PROGRESS NOTE  TAZARIA DLUGOSZ NGE:[id number] DOB: 21-Nov-1934 DOA: 09/06/2013 PCP: Glo Herring., MD  Assessment/Plan: 1. Acute on chronic CHF with preserved LVEF: Cardiology following. She remains on a Lasix drip. She has a negative net fluid balance of 2.9 L in the last 24 hours. Her weight continues to come down from 95.8kg to 94.9kg today. Cardiology recommending continuing Lasix drip as she still has 5-10 lbs. Kidney function remains stable as a.m. lab work shows creatinine 1.35, slightly increased from 1.25.  2. Hypokalemia. Patient's potassium at 3.3, she was given additional potassium today. 3. Acute renal failure. Remains a Lasix drip, will monitor kidney function closely.  4. Acute on chronic respiratory failure. Likely secondary to decompensated heart failure. 5. Hypertension: Her blood pressures are stable, on Lasix drip now. 6. Obstructive sleep apnea. Patient refusing to wear CPAP   7. DM2: blood sugars well controled. Continue with glipizide, stop standing insulin with meals. ssi sensitive. 8. PUD: PPI 9. Pulmonary hypertension  Code Status: partial DNI Disposition Plan: Anticipate discharge in the next 24-48 hours.    Consultants:  cardiology  Procedures:  none  Antibiotics: levaquin 1/13>>  HPI/Subjective: Patient is quite adamant about going home. She denies chest pain, shortness of breath, tolerating by mouth intake.   Objective: Filed Vitals:   09/13/13 1410  BP: 104/69  Pulse: 78  Temp: 98.6 F (37 C)  Resp: 18    Intake/Output Summary (Last 24 hours) at 09/13/13 1715 Last data filed at 09/13/13 0629  Gross per 24 hour  Intake      0 ml  Output   2000 ml  Net  -2000 ml   Filed Weights   09/11/13 0621 09/12/13 0449 09/13/13 0435  Weight: 97.2 kg (214 lb 4.6 oz) 95.845 kg (211 lb 4.8 oz) 94.983 kg (209 lb 6.4 oz)    Exam:   General:  Asking to go home, states feeling better, appearing stable.  Cardiovascular: RRR no  mrg  Respiratory: Lung exam unchanged, good air movement, no wheezing rhonchi or rales  Abdomen: BS+ soft nt  Musculoskeletal: Continues to have bilateral 2+ pitting edema although improved  Data Reviewed: Basic Metabolic Panel:  Recent Labs Lab 09/09/13 0557 09/10/13 0322 09/11/13 0312 09/12/13 0503 09/13/13 0345  NA 135* 139 137 137 139  K 3.4* 3.5* 4.7 3.2* 3.3*  CL 91* 92* 92* 89* 88*  CO2 30 30 30  34* 34*  GLUCOSE 91 116* 106* 80 128*  BUN 31* 29* 29* 30* 32*  CREATININE 1.29* 1.27* 1.23* 1.25* 1.35*  CALCIUM 9.4 9.6 9.5 9.5 9.9   Liver Function Tests:  Recent Labs Lab 09/06/13 1825  AST 22  ALT 10  ALKPHOS 141*  BILITOT 0.5  PROT 7.0  ALBUMIN 4.1   No results found for this basename: LIPASE, AMYLASE,  in the last 168 hours No results found for this basename: AMMONIA,  in the last 168 hours CBC:  Recent Labs Lab 09/06/13 1825 09/07/13 0155 09/09/13 0557 09/12/13 0503  WBC 8.9 7.0 6.5 5.8  NEUTROABS 6.1 5.5  --   --   HGB 10.2* 9.6* 9.7* 10.2*  HCT 34.1* 33.0* 32.8* 34.3*  MCV 65.3* 67.1* 64.8* 64.7*  PLT 160 126* 136* 144*   Cardiac Enzymes:  Recent Labs Lab 09/06/13 1912 09/07/13 0155 09/07/13 0747 09/07/13 1305  TROPONINI <0.30 <0.30 <0.30 <0.30   BNP (last 3 results)  Recent Labs  04/04/13 1618 06/24/13 1825 09/06/13 1825  PROBNP 2372.0* 2425.0* 2771.0*  CBG:  Recent Labs Lab 09/12/13 1128 09/12/13 1626 09/12/13 2112 09/13/13 0619 09/13/13 1617  GLUCAP 213* 197* 103* 106* 181*    No results found for this or any previous visit (from the past 240 hour(s)).   Studies: No results found.  Scheduled Meds: . ALPRAZolam  0.5 mg Oral BID  . brimonidine  1 drop Both Eyes Daily  . docusate sodium  100 mg Oral BID  . enoxaparin (LOVENOX) injection  40 mg Subcutaneous Q24H  . ezetimibe  10 mg Oral QHS  . glipiZIDE  10 mg Oral BID AC  . insulin aspart  0-15 Units Subcutaneous TID WC  . insulin aspart  0-5 Units  Subcutaneous QHS  . ipratropium  0.5 mg Nebulization Once  . iron polysaccharides  150 mg Oral Daily  . latanoprost  1 drop Both Eyes QHS  . levothyroxine  150 mcg Oral QAC breakfast  . loratadine  10 mg Oral Daily  . metolazone  2.5 mg Oral BID  . oxyCODONE-acetaminophen  1 tablet Oral BID  . pantoprazole  40 mg Oral BID  . polyethylene glycol  17 g Oral Daily  . potassium chloride SA  40 mEq Oral TID  . simvastatin  10 mg Oral q1800  . sodium chloride  3 mL Intravenous Q12H  . sodium chloride  3 mL Intravenous Q12H   Continuous Infusions: . 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 - With Acute on Chronic exaverbation Active Problems:   Chronic respiratory failure with hypoxia   DM2 (diabetes mellitus, type 2)   Unspecified hypothyroidism   Normal coronary arteries at cath 2011   Obesity, morbid (more than 100 lbs over ideal weight or BMI > 40)   Pulmonary hypertension, Severe by Echo 02/1713-pa pressure 74 mmHg   Diastolic CHF, chronic- EF 65-70% May 2014   Acute blood loss anemia- transfused 10/14- negative endoscopy   PUD (peptic ulcer disease)- ASA stopped   CAP (community acquired pneumonia)   Pulmonary hypertension   Acute on chronic diastolic heart failure    Time spent: 25 minutes    [name]  [facility] Pager [phone]. If 7PM-7AM, please contact night-coverage at [email or web address], password [facility] 09/13/2013, 5:15 PM  LOS: 7 days

## 2013-09-13 NOTE — Progress Notes (Signed)
Patient refusing to wear CPAP tonight.  Was told if she changed her mind to call RT. 

## 2013-09-13 NOTE — Progress Notes (Signed)
Physical Therapy Treatment and Discharge Patient Details Name: [name] MRN: [id number] DOB: 05/22/1935 Today's Date: 09/13/2013 Time: 1770-1939 PT Time Calculation (min): 9 min  PT Assessment / Plan / Recommendation  History of Present Illness 2 week hx of shortness of breath, increased leg swelling. She has gained 10 lb in the past few days. Denies any chest pain. But has increased work of breathing. A home she is on 4 L of oxygen but required up to 6-8L  in ER to maintain Oxygen sats of 91% confirmed by ABG. Patient reports hx of COPD. Denies any fever or chills. Patient gt Lasix 40 mg IV in ER and had some diuresis. Feels better. CXR showing right pleural effusion and basilar collapse/consolidation   PT Comments   Pt up in chair with daughter present on arrival. Tolerating activity much better and has been ambulating with family. Pt/family agreed pt has no further PT needs and are hoping to go home today.    Follow Up Recommendations  No PT follow up (Pt/family feel she is nearly baseline and deny need for PT)     Does the patient have the potential to tolerate intense rehabilitation     Barriers to Discharge        Equipment Recommendations  None recommended by PT    Recommendations for Other Services    Frequency     Progress towards PT Goals Progress towards PT goals: Goals met/education completed, patient discharged from PT  Plan Discharge plan needs to be updated    Precautions / Restrictions Precautions Precautions: Fall Precaution Comments: requires 24; reports last fell >2 yrs ago when tripped on an unlevel place on her ramp (her son has since fixed ramp) Restrictions Weight Bearing Restrictions: No   Pertinent Vitals/Pain SaO2 down to 86% on 4L with repeated sit to stand. Returned to 90% in 45 seconds with seated rest.    Mobility  Transfers Overall transfer level: Modified independent Equipment used: None Transfers: Sit to/from Stand Sit to Stand:  Modified independent (Device/Increase time) General transfer comment: stood x 4 for strengthening and to educate on limiting use of UEs to further strengthen her legs. Pt able to control descent without use of UEs, however needs minimal push from UEs to come to stand. Ambulation/Gait General Gait Details: reports walked with family     Exercises General Exercises - Upper Extremity Shoulder Flexion: AROM;Both;Other reps (comment);Seated;Limitations Shoulder Flexion Limitations: Pt able to demonstrate proper breathing with arm movement  General Exercises - Lower Extremity Ankle Circles/Pumps: AROM;Both;Other reps (comment);Seated Long Arc Quad: AROM;Right;Other reps (comment);Seated Hip Flexion/Marching: AROM;Both;Other reps (comment);Seated Heel Raises: AROM;Both;Other reps (comment);Seated Other Exercises Other Exercises: Reports she has been doing the exercises she was instructed in throughout the day.   PT Diagnosis:    PT Problem List:   PT Treatment Interventions:     PT Goals (current goals can now be found in the care plan section) Acute Rehab PT Goals Patient Stated Goal: go home today  Visit Information  Last PT Received On: 09/13/13 Assistance Needed: +1 History of Present Illness: 2 week hx of shortness of breath, increased leg swelling. She has gained 10 lb in the past few days. Denies any chest pain. But has increased work of breathing. A home she is on 4 L of oxygen but required up to 6-8L  in ER to maintain Oxygen sats of 91% confirmed by ABG. Patient reports hx of COPD. Denies any fever or chills. Patient gt Lasix 40 mg IV  in ER and had some diuresis. Feels better. CXR showing right pleural effusion and basilar collapse/consolidation    Subjective Data  Subjective: Reports she is walking with family in hallway. Daughter states she is close to her baseline and is doing better than she was on admission Patient Stated Goal: go home today   Cognition   Cognition Arousal/Alertness: Awake/alert Behavior During Therapy: WFL for tasks assessed/performed Overall Cognitive Status: Within Functional Limits for tasks assessed    Balance  Balance Overall balance assessment: No apparent balance deficits (not formally assessed) (stood without UE support; weight shifting without difficulty)  End of Session PT - End of Session Equipment Utilized During Treatment: Oxygen (4L) Activity Tolerance: Other (comment) (SaO2 decr to 86% with repeated sit to stand ) Patient left: in bed;with call bell/phone within reach;with family/visitor present   Patient is being discharged from PT services secondary to:  Goals met and no further therapy needs identified.   Please see latest Therapy Progress Note for current level of functioning and progress toward goals.  Progress and discharge plan and discussed with patient/caregiver and they  Agree Patient unable to participate in discharge planning    GP     Madelyn Tlatelpa 09/13/2013, 11:56 AM Pager [phone]

## 2013-09-13 NOTE — Progress Notes (Signed)
Primary cardiologist: Dr. Glori Bickers  Subjective:    Stable overnight. Weight down 2 lbs. Denies any orthopnea or CP. Mild SOB with walking in the halls. Refuses CPAP.   Objective:   Temp:  [97.5 F (36.4 C)-97.8 F (36.6 C)] 97.8 F (36.6 C) (01/19 0435) Pulse Rate:  [96-100] 98 (01/19 0435) Resp:  [18-20] 18 (01/19 0435) BP: (104-123)/(63-66) 106/63 mmHg (01/19 0435) SpO2:  [92 %-96 %] 92 % (01/19 0435) Weight:  [209 lb 6.4 oz (94.983 kg)] 209 lb 6.4 oz (94.983 kg) (01/19 0435) Last BM Date: 09/11/13  Filed Weights   09/11/13 0621 09/12/13 0449 09/13/13 0435  Weight: 214 lb 4.6 oz (97.2 kg) 211 lb 4.8 oz (95.845 kg) 209 lb 6.4 oz (94.983 kg)    Intake/Output Summary (Last 24 hours) at 09/13/13 0725 Last data filed at 09/13/13 0629  Gross per 24 hour  Intake    480 ml  Output   3400 ml  Net  -2920 ml    Telemetry: Sinus rhythm, sinus tachycardia, occasional PVCs.  Exam:  General: No distress; sitting in recliner  Lungs: Decreased breath sounds, nonlabored. JVD difficult to assess but still appears elevated.   Cardiac: RRR, no gallop.  Abdomen: Protuberant, soft.  Extremities: 2-3+ edema bilateral LE   Lab Results:  Basic Metabolic Panel:  Recent Labs Lab 09/11/13 0312 09/12/13 0503 09/13/13 0345  NA 137 137 139  K 4.7 3.2* 3.3*  CL 92* 89* 88*  CO2 30 34* 34*  GLUCOSE 106* 80 128*  BUN 29* 30* 32*  CREATININE 1.23* 1.25* 1.35*  CALCIUM 9.5 9.5 9.9    Liver Function Tests:  Recent Labs Lab 09/06/13 1825  AST 22  ALT 10  ALKPHOS 141*  BILITOT 0.5  PROT 7.0  ALBUMIN 4.1    CBC:  Recent Labs Lab 09/07/13 0155 09/09/13 0557 09/12/13 0503  WBC 7.0 6.5 5.8  HGB 9.6* 9.7* 10.2*  HCT 33.0* 32.8* 34.3*  MCV 67.1* 64.8* 64.7*  PLT 126* 136* 144*    Echocardiogram (1/14): Study Conclusions  - Left ventricle: Systolic function was normal. The estimated ejection fraction was in the range of 55% to 60%. Wall motion was normal;  there were no regional wall motion abnormalities. Due to tachycardia, there was fusion of early and atrial contributions to ventricular filling. The study is not technically sufficient to allow evaluation of LV diastolic function. - Ventricular septum: The contour showed systolic flattening. These changes are consistent with RV pressure overload. - Aortic valve: Trivial regurgitation. - Right ventricle: The cavity size was severely dilated. Systolic function was severely reduced. - Right atrium: The atrium was mildly dilated. - Tricuspid valve: Moderate regurgitation. - Pulmonary arteries: Systolic pressure was moderately to severely increased. PA peak pressure: 41mm Hg (S).   Medications:   Scheduled Medications: . ALPRAZolam  0.5 mg Oral BID  . brimonidine  1 drop Both Eyes Daily  . docusate sodium  100 mg Oral BID  . enoxaparin (LOVENOX) injection  40 mg Subcutaneous Q24H  . ezetimibe  10 mg Oral QHS  . glipiZIDE  10 mg Oral BID AC  . insulin aspart  0-15 Units Subcutaneous TID WC  . insulin aspart  0-5 Units Subcutaneous QHS  . ipratropium  0.5 mg Nebulization Once  . iron polysaccharides  150 mg Oral Daily  . latanoprost  1 drop Both Eyes QHS  . levothyroxine  150 mcg Oral QAC breakfast  . loratadine  10 mg Oral Daily  . metolazone  2.5 mg Oral BID  . oxyCODONE-acetaminophen  1 tablet Oral BID  . pantoprazole  40 mg Oral BID  . polyethylene glycol  17 g Oral Daily  . potassium chloride SA  40 mEq Oral TID  . simvastatin  10 mg Oral q1800  . sodium chloride  3 mL Intravenous Q12H  . sodium chloride  3 mL Intravenous Q12H    Infusions: . furosemide (LASIX) infusion 10 mg/hr (09/10/13 1132)    PRN Medications: sodium chloride, sodium chloride, acetaminophen, bisacodyl, ondansetron (ZOFRAN) IV, sodium chloride, sodium chloride   Assessment:   1. Acute on chronic right heart failure/HFpEF due to severe pulmonary hypertension (PA pressure 33mmHg by echo 02/2013,  normal cors 2011). Remains on lasix gtt.   2. Acute renal insufficiency: Cr stable 1.35  3. Acute on chronic respiratory failure on 4 liters [place] chronically.  4. Morbid obesity with OSA/OHS - Body mass index is 41.64 kg/(m^2).   5. CAP - ? Aspiration.  6. HTN. Better controlled.  7. Peptic ulcer disease.   8. Chronic anemia with thrombocytopenia this admission, microcytic, transfused 05/2013 with negative endoscopy. Per GI notes on report, it is unlikely she would tolerate a colonscopy and a SBE would likely not show any treatable lesions thus empiric rx was recommended. No longer on ASA. On PPI for PUD.   9. Diabetes mellitus    Plan/Discussion:    Volume status continues to improve slowly. She still has at least 5-10 lbs of fluid on board, will continue lasix gtt and follow Cr closely. Place TED hose. Continue to work with PT. Will supplement K+.    Junie Bame B NP-C 7:57 AM  Patient seen and examined with Junie Bame, NP. We discussed all aspects of the encounter. I agree with the assessment and plan as stated above.   Doing well with diuresis. Still with fluid on board. Agree with continuing current plan.   Daniel Bensimhon,MD 8:08 AM

## 2013-09-14 LAB — GLUCOSE, CAPILLARY
Glucose-Capillary: 118 mg/dL — ABNORMAL HIGH (ref 70–99)
Glucose-Capillary: 203 mg/dL — ABNORMAL HIGH (ref 70–99)
Glucose-Capillary: 171 mg/dL — ABNORMAL HIGH (ref 70–99)

## 2013-09-14 LAB — BASIC METABOLIC PANEL
BUN: 35 mg/dL — AB (ref 6–23)
CALCIUM: 9.6 mg/dL (ref 8.4–10.5)
CO2: 33 mEq/L — ABNORMAL HIGH (ref 19–32)
CREATININE: 1.26 mg/dL — AB (ref 0.50–1.10)
Chloride: 89 mEq/L — ABNORMAL LOW (ref 96–112)
GFR calc non Af Amer: 40 mL/min — ABNORMAL LOW (ref >90)
GFR, EST AFRICAN AMERICAN: 47 mL/min — AB (ref >90)
Glucose, Bld: 134 mg/dL — ABNORMAL HIGH (ref 70–99)
Potassium: 3.3 mEq/L — ABNORMAL LOW (ref 3.7–5.3)
Sodium: 138 mEq/L (ref 137–147)

## 2013-09-14 MED ORDER — POTASSIUM CHLORIDE CRYS ER 20 MEQ PO TBCR
40.0000 meq | EXTENDED_RELEASE_TABLET | ORAL | Status: AC
  Administered 2013-09-14: 40 meq via ORAL
  Filled 2013-09-14: qty 2

## 2013-09-14 MED ORDER — POTASSIUM CHLORIDE CRYS ER 20 MEQ PO TBCR
40.0000 meq | EXTENDED_RELEASE_TABLET | Freq: Four times a day (QID) | ORAL | Status: AC
  Administered 2013-09-14 (×2): 40 meq via ORAL

## 2013-09-14 NOTE — Progress Notes (Signed)
Primary cardiologist: Dr. Glori Bickers  Subjective:    Weight down another 4 lbs (total 19 lbs). 24 hr I/O -1.8 liters. Denies any orthopnea, PND or CP. Yesterday family and patient denied PT felt like back to baseline. Reports walking to nurses desk yesterday with no issues.   Objective:   Temp:  [97.8 F (36.6 C)-98.6 F (37 C)] 97.8 F (36.6 C) (01/20 0627) Pulse Rate:  [78-97] 95 (01/20 0627) Resp:  [16-18] 16 (01/20 0627) BP: (104-108)/(55-69) 106/55 mmHg (01/20 0627) SpO2:  [91 %-97 %] 97 % (01/20 0627) Weight:  [205 lb 12.8 oz (93.35 kg)] 205 lb 12.8 oz (93.35 kg) (01/20 0627) Last BM Date: 09/13/13  Filed Weights   09/12/13 0449 09/13/13 0435 09/14/13 0627  Weight: 211 lb 4.8 oz (95.845 kg) 209 lb 6.4 oz (94.983 kg) 205 lb 12.8 oz (93.35 kg)    Intake/Output Summary (Last 24 hours) at 09/14/13 0659 Last data filed at 09/14/13 0631  Gross per 24 hour  Intake    480 ml  Output   2300 ml  Net  -1820 ml    Telemetry: Sinus rhythm, with frequent PVCs 90s  Exam:  General: No distress; sitting in recliner  Lungs: CTA, nonlabored. JVD difficult to assess but still appears mildly elevated.   Cardiac: RRR, no gallop.  Abdomen: Protuberant, soft.  Extremities: 2+ edema bilateral LE   Lab Results:  Basic Metabolic Panel:  Recent Labs Lab 09/12/13 0503 09/13/13 0345 09/14/13 0415  NA 137 139 138  K 3.2* 3.3* 3.3*  CL 89* 88* 89*  CO2 34* 34* 33*  GLUCOSE 80 128* 134*  BUN 30* 32* 35*  CREATININE 1.25* 1.35* 1.26*  CALCIUM 9.5 9.9 9.6    Liver Function Tests: No results found for this basename: AST, ALT, ALKPHOS, BILITOT, PROT, ALBUMIN,  in the last 168 hours  CBC:  Recent Labs Lab 09/09/13 0557 09/12/13 0503  WBC 6.5 5.8  HGB 9.7* 10.2*  HCT 32.8* 34.3*  MCV 64.8* 64.7*  PLT 136* 144*    Echocardiogram (1/14): Study Conclusions  - Left ventricle: Systolic function was normal. The estimated ejection fraction was in the range of 55%  to 60%. Wall motion was normal; there were no regional wall motion abnormalities. Due to tachycardia, there was fusion of early and atrial contributions to ventricular filling. The study is not technically sufficient to allow evaluation of LV diastolic function. - Ventricular septum: The contour showed systolic flattening. These changes are consistent with RV pressure overload. - Aortic valve: Trivial regurgitation. - Right ventricle: The cavity size was severely dilated. Systolic function was severely reduced. - Right atrium: The atrium was mildly dilated. - Tricuspid valve: Moderate regurgitation. - Pulmonary arteries: Systolic pressure was moderately to severely increased. PA peak pressure: 25mm Hg (S).   Medications:   Scheduled Medications: . ALPRAZolam  0.5 mg Oral BID  . brimonidine  1 drop Both Eyes Daily  . docusate sodium  100 mg Oral BID  . enoxaparin (LOVENOX) injection  40 mg Subcutaneous Q24H  . ezetimibe  10 mg Oral QHS  . glipiZIDE  10 mg Oral BID AC  . insulin aspart  0-15 Units Subcutaneous TID WC  . insulin aspart  0-5 Units Subcutaneous QHS  . ipratropium  0.5 mg Nebulization Once  . iron polysaccharides  150 mg Oral Daily  . latanoprost  1 drop Both Eyes QHS  . levothyroxine  150 mcg Oral QAC breakfast  . loratadine  10 mg Oral Daily  .  metolazone  2.5 mg Oral BID  . oxyCODONE-acetaminophen  1 tablet Oral BID  . pantoprazole  40 mg Oral BID  . polyethylene glycol  17 g Oral Daily  . potassium chloride SA  40 mEq Oral TID  . simvastatin  10 mg Oral q1800  . sodium chloride  3 mL Intravenous Q12H  . sodium chloride  3 mL Intravenous Q12H    Infusions: . furosemide (LASIX) infusion 10 mg/hr (09/13/13 2224)    PRN Medications: sodium chloride, sodium chloride, acetaminophen, bisacodyl, ondansetron (ZOFRAN) IV, sodium chloride, sodium chloride   Assessment:   1. Acute on chronic right heart failure/HFpEF due to severe pulmonary hypertension (PA  pressure 67mmHg by echo 02/2013, normal cors 2011). Remains on lasix gtt.   2. Acute renal insufficiency:   3. Acute on chronic respiratory failure on 4 liters [place] chronically.  4. Morbid obesity with OSA/OHS - Body mass index is 41.64 kg/(m^2).   5. CAP - ? Aspiration.  6. HTN. Better controlled.  7. Peptic ulcer disease.   8. Chronic anemia with thrombocytopenia this admission, microcytic, transfused 05/2013 with negative endoscopy. Per GI notes on report, it is unlikely she would tolerate a colonscopy and a SBE would likely not show any treatable lesions thus empiric rx was recommended. No longer on ASA. On PPI for PUD.   9. Diabetes mellitus    Plan/Discussion:    Continues to diurese down another 4 lbs overnight. She still has volume on board and Cr stable. Will continue lasix gtt for 1-2 more days. Hopefully home by Thursday. Cr stable and will continue to follow. Will ask staff to please place TED hose during the day. Since PT has signed off she needs to continue to ambulate in the halls.   Junie Bame B NP-C 6:59 AM  Advanced Heart Failure Team Pager [phone] (M-F; 7a - 4p)  Please contact [facility] for night-coverage after hours (4p -7a ) and weekends on [email or web address]  Patient seen and examined with Junie Bame, NP. We discussed all aspects of the encounter. I agree with the assessment and plan as stated above. Down 19 pounds and still with more fluid on board. Would continue to diurese. Have again recommended TED hose but not sure she will comply. Will increase lasix gtt to 15/hr. Watch renal function and electrolytes.   Christop Hippert,MD 8:53 AM

## 2013-09-14 NOTE — Progress Notes (Signed)
[facility] PROGRESS NOTE  LADANA CHAVERO WUJ:[id number] DOB: Mar 03, 1935 DOA: 09/06/2013 PCP: Glo Herring., MD   Interim Summary Patient is a pleasant 79 year old female with a past medical history of morbid obesity, obesity hypoventilation syndrome, pulmonary hypertension, right-sided heart failure who was admitted to the medicine service on 09/06/2013 when she presented with complaints of shortness of breath, increasing bilateral lower extremity pitting edema, weight gain and orthopnea. She has a history of chronic hypoxemic respiratory failure requiring 4 L supplemental oxygen at baseline however had been needing 6 L via nasal cannula to maintain oxygen saturations in the low 90s. She was started on IV Lasix 4 acute decompensated congestive heart failure initially a 40 mg IV twice a day. Cardiology was consulted. A transthoracic echocardiogram was performed 09/08/2013 which showed an ejection fraction of 55-60%, without wall motion abnormalities. Could not evaluate diastolic function. Her right ventricle however was severely dilated as she had pulmonary artery peak pressure of 67 mmHg she was referred to the heart failure team. She was placed on a Lasix drip as she has had symptomatic improvement with a gradual downward trend in her weight. She remains on Lasix drip, as her weight has come down by nearly 20 pounds.                                                                                                                                                                                                                       Assessment/Plan: 1. Acute on chronic CHF with preserved LVEF: Cardiology following. She remains on a Lasix drip. She has a negative net fluid balance of 2.9 L in the last 24 hours. Her weight continues to come down from 95.8kg to 94.9kg today. Cardiology recommending continuing Lasix drip as she still has 5-10 lbs. Kidney function remains stable as a.m. lab work shows  creatinine 1.35, slightly increased from 1.25.  2. Hypokalemia. Patient's potassium at 3.3, she was given additional potassium today. 3. Acute renal failure. Remains a Lasix drip, will monitor kidney function closely.  4. Acute on chronic respiratory failure. Likely secondary to decompensated heart failure. 5. Hypertension: Her blood pressures are stable, on Lasix drip now. 6. Obstructive sleep apnea. Patient refusing to wear CPAP   7. DM2: blood sugars well controled. Continue with glipizide, stop standing insulin with meals. ssi sensitive. 8. PUD: PPI 9. Pulmonary hypertension  Code Status: partial DNI Disposition Plan: Anticipate discharge in the next 24-48 hours.    Consultants:  cardiology  Procedures:  none  Antibiotics: levaquin 1/13>>  HPI/Subjective: Patient is quite adamant about going home. She denies chest pain, shortness of breath, tolerating by mouth intake.   Objective: Filed Vitals:   09/14/13 0900  BP: 100/53  Pulse: 88  Temp: 98 F (36.7 C)  Resp: 16    Intake/Output Summary (Last 24 hours) at 09/14/13 1715 Last data filed at 09/14/13 1100  Gross per 24 hour  Intake    720 ml  Output   3100 ml  Net  -2380 ml   Filed Weights   09/12/13 0449 09/13/13 0435 09/14/13 0627  Weight: 95.845 kg (211 lb 4.8 oz) 94.983 kg (209 lb 6.4 oz) 93.35 kg (205 lb 12.8 oz)    Exam:   General:  Asking to go home, states feeling better, appearing stable.  Cardiovascular: RRR no mrg  Respiratory: Lung exam unchanged, good air movement, no wheezing rhonchi or rales  Abdomen: BS+ soft nt  Musculoskeletal: Continues to have bilateral 2+ pitting edema although improved  Data Reviewed: Basic Metabolic Panel:  Recent Labs Lab 09/10/13 0322 09/11/13 0312 09/12/13 0503 09/13/13 0345 09/14/13 0415  NA 139 137 137 139 138  K 3.5* 4.7 3.2* 3.3* 3.3*  CL 92* 92* 89* 88* 89*  CO2 30 30 34* 34* 33*  GLUCOSE 116* 106* 80 128* 134*  BUN 29* 29* 30* 32* 35*   CREATININE 1.27* 1.23* 1.25* 1.35* 1.26*  CALCIUM 9.6 9.5 9.5 9.9 9.6   Liver Function Tests: No results found for this basename: AST, ALT, ALKPHOS, BILITOT, PROT, ALBUMIN,  in the last 168 hours No results found for this basename: LIPASE, AMYLASE,  in the last 168 hours No results found for this basename: AMMONIA,  in the last 168 hours CBC:  Recent Labs Lab 09/09/13 0557 09/12/13 0503  WBC 6.5 5.8  HGB 9.7* 10.2*  HCT 32.8* 34.3*  MCV 64.8* 64.7*  PLT 136* 144*   Cardiac Enzymes: No results found for this basename: CKTOTAL, CKMB, CKMBINDEX, TROPONINI,  in the last 168 hours BNP (last 3 results)  Recent Labs  04/04/13 1618 06/24/13 1825 09/06/13 1825  PROBNP 2372.0* 2425.0* 2771.0*   CBG:  Recent Labs Lab 09/13/13 0619 09/13/13 1617 09/13/13 2129 09/14/13 0625 09/14/13 1129  GLUCAP 106* 181* 146* 118* 203*    No results found for this or any previous visit (from the past 240 hour(s)).   Studies: No results found.  Scheduled Meds: . ALPRAZolam  0.5 mg Oral BID  . brimonidine  1 drop Both Eyes Daily  . docusate sodium  100 mg Oral BID  . enoxaparin (LOVENOX) injection  40 mg Subcutaneous Q24H  . ezetimibe  10 mg Oral QHS  . glipiZIDE  10 mg Oral BID AC  . insulin aspart  0-15 Units Subcutaneous TID WC  . insulin aspart  0-5 Units Subcutaneous QHS  . ipratropium  0.5 mg Nebulization Once  . iron polysaccharides  150 mg Oral Daily  . latanoprost  1 drop Both Eyes QHS  . levothyroxine  150 mcg Oral QAC breakfast  . loratadine  10 mg Oral Daily  . metolazone  2.5 mg Oral BID  . oxyCODONE-acetaminophen  1 tablet Oral BID  . pantoprazole  40 mg Oral BID  . polyethylene glycol  17 g Oral Daily  . potassium chloride SA  40 mEq Oral TID  . potassium chloride  40 mEq Oral Q6H  . simvastatin  10 mg Oral q1800  . sodium chloride  3 mL Intravenous Q12H  .  sodium chloride  3 mL Intravenous Q12H   Continuous Infusions: . furosemide (LASIX) infusion 15 mg/hr  (09/14/13 0944)    Principal Problem:   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 - With Acute on Chronic exaverbation Active Problems:   Chronic respiratory failure with hypoxia   DM2 (diabetes mellitus, type 2)   Unspecified hypothyroidism   Normal coronary arteries at cath 2011   Obesity, morbid (more than 100 lbs over ideal weight or BMI > 40)   Pulmonary hypertension, Severe by Echo 02/1713-pa pressure 74 mmHg   Diastolic CHF, chronic- EF 65-70% May 2014   Acute blood loss anemia- transfused 10/14- negative endoscopy   PUD (peptic ulcer disease)- ASA stopped   CAP (community acquired pneumonia)   Pulmonary hypertension   Acute on chronic diastolic heart failure    Time spent: 25 minutes    [name]  [facility] Pager [phone]. If 7PM-7AM, please contact night-coverage at [email or web address], password [facility] 09/14/2013, 5:15 PM  LOS: 8 days

## 2013-09-14 NOTE — Progress Notes (Signed)
Patient refusing to wear CPAP at this time.  Was told if she changed her mind to call RT.

## 2013-09-14 NOTE — Progress Notes (Signed)
OT NOTE  Pt reports being back to baseline and no further acute OT needs. Pt has 12pm-3pm aide daily upon dc home. Ot to sign off acutely   Jeri Modena   OTR/L Pager: [phone] Office: [phone] .

## 2013-09-15 LAB — BASIC METABOLIC PANEL
BUN: 36 mg/dL — AB (ref 6–23)
CO2: 33 mEq/L — ABNORMAL HIGH (ref 19–32)
Calcium: 10 mg/dL (ref 8.4–10.5)
Chloride: 90 mEq/L — ABNORMAL LOW (ref 96–112)
Creatinine, Ser: 1.29 mg/dL — ABNORMAL HIGH (ref 0.50–1.10)
GFR calc non Af Amer: 39 mL/min — ABNORMAL LOW (ref >90)
GFR, EST AFRICAN AMERICAN: 45 mL/min — AB (ref >90)
Glucose, Bld: 145 mg/dL — ABNORMAL HIGH (ref 70–99)
POTASSIUM: 4.3 meq/L (ref 3.7–5.3)
Sodium: 138 mEq/L (ref 137–147)

## 2013-09-15 LAB — CBC
HCT: 34.6 % — ABNORMAL LOW (ref 36.0–46.0)
HEMOGLOBIN: 10 g/dL — AB (ref 12.0–15.0)
MCH: 19 pg — ABNORMAL LOW (ref 26.0–34.0)
MCHC: 28.9 g/dL — ABNORMAL LOW (ref 30.0–36.0)
MCV: 65.7 fL — AB (ref 78.0–100.0)
Platelets: 137 10*3/uL — ABNORMAL LOW (ref 150–400)
RBC: 5.27 MIL/uL — ABNORMAL HIGH (ref 3.87–5.11)
RDW: 18.2 % — ABNORMAL HIGH (ref 11.5–15.5)
WBC: 6.1 10*3/uL (ref 4.0–10.5)

## 2013-09-15 LAB — GLUCOSE, CAPILLARY
GLUCOSE-CAPILLARY: 127 mg/dL — AB (ref 70–99)
GLUCOSE-CAPILLARY: 230 mg/dL — AB (ref 70–99)
Glucose-Capillary: 115 mg/dL — ABNORMAL HIGH (ref 70–99)

## 2013-09-15 MED ORDER — METOLAZONE 5 MG PO TABS: 5.0000 mg | ORAL_TABLET | ORAL | Status: DC

## 2013-09-15 MED ORDER — POTASSIUM CHLORIDE CRYS ER 20 MEQ PO TBCR: 40.0000 meq | EXTENDED_RELEASE_TABLET | Freq: Two times a day (BID) | ORAL | Status: DC

## 2013-09-15 MED ORDER — TORSEMIDE 20 MG PO TABS: 40.0000 mg | ORAL_TABLET | Freq: Two times a day (BID) | ORAL | Status: DC

## 2013-09-15 NOTE — Progress Notes (Signed)
Nursing Note Patient given discharge instructions AVS, and paper prescriptions, reviewed with patient. Will discharge home as ordered. Tamma Brigandi, [employer]

## 2013-09-15 NOTE — Discharge Summary (Signed)
PATIENT DETAILS Name: [name] Age: 79 y.o. Sex: female Date of Birth: 1934/09/09 MRN: [id number]. Admit Date: 09/06/2013 Admitting Physician: Toy Baker, MD KDT:OIZTI,WPYKDXIP J., MD  Recommendations for Outpatient Follow-up:  1. Please monitor electrolytes at next visit.  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 - With Acute on Chronic exaverbation Active Problems:   Chronic respiratory failure with hypoxia   DM2 (diabetes mellitus, type 2)   Unspecified hypothyroidism   Normal coronary arteries at cath 2011   Obesity, morbid (more than 100 lbs over ideal weight or BMI > 40)   Pulmonary hypertension, Severe by Echo 02/1713-pa pressure 74 mmHg   Diastolic CHF, chronic- EF 65-70% May 2014   Acute blood loss anemia- transfused 10/14- negative endoscopy   PUD (peptic ulcer disease)- ASA stopped   CAP (community acquired pneumonia)   Pulmonary hypertension   Acute on chronic diastolic heart failure      PAST MEDICAL HISTORY: Past Medical History  Diagnosis Date  . Diabetes mellitus   . Hypertension   . Shingles   . GERD (gastroesophageal reflux disease)   . Abnormal heart rhythms   . Thyroid disease     hypthyroidism  . Pulmonary hypertension, moderate to severe     No evidence of CAD on Cath 2011  . Obesity, morbid (more than 100 lbs over ideal weight or BMI > 40)   . Chronic respiratory failure with hypoxia 01/23/2012    Related to OHS  . Glaucoma   . CHF (congestive heart failure)     Echo at [facility] 10/14/2012 see notes  . Acute on chronic renal failure   . COPD (chronic obstructive pulmonary disease)     DISCHARGE MEDICATIONS:   Medication List         ALPRAZolam 0.5 MG tablet  Commonly known as:  XANAX  Take 0.5 mg by mouth 2 (two) times daily. May take up to 4 times daily as prescribed     bisacodyl 10 MG suppository  Commonly known as:  DULCOLAX  Place 10 mg rectally as needed for  constipation.     brimonidine 0.1 % Soln  Commonly known as:  ALPHAGAN P  Place 1 drop into both eyes daily.     ezetimibe 10 MG tablet  Commonly known as:  ZETIA  Take 10 mg by mouth at bedtime.     fexofenadine 180 MG tablet  Commonly known as:  ALLEGRA  Take 180 mg by mouth every morning.     glipiZIDE 10 MG tablet  Commonly known as:  GLUCOTROL  Take 10 mg by mouth 2 (two) times daily before a meal.     iron polysaccharides 150 MG capsule  Commonly known as:  NIFEREX  Take 1 capsule (150 mg total) by mouth daily.     levothyroxine 150 MCG tablet  Commonly known as:  SYNTHROID, LEVOTHROID  Take 150 mcg by mouth daily before breakfast.     linagliptin 5 MG Tabs tablet  Commonly known as:  TRADJENTA  Take 5 mg by mouth every morning.     metolazone 5 MG tablet  Commonly known as:  ZAROXOLYN  Take 1 tablet (5 mg total) by mouth every other day.     oxyCODONE-acetaminophen 5-325 MG per tablet  Commonly known as:  PERCOCET/ROXICET  Take 1 tablet by mouth 2 (two) times daily. May take 1 additional dose if needed for pain     OXYGEN-HELIUM IN  Inhale 4  L into the lungs continuous.     pantoprazole 40 MG tablet  Commonly known as:  PROTONIX  Take 1 tablet (40 mg total) by mouth 2 (two) times daily.     polyethylene glycol packet  Commonly known as:  MIRALAX / GLYCOLAX  Take 17 g by mouth daily.     potassium chloride SA 20 MEQ tablet  Commonly known as:  K-DUR,KLOR-CON  Take 2 tablets (40 mEq total) by mouth 2 (two) times daily.     pravastatin 20 MG tablet  Commonly known as:  PRAVACHOL  Take 20 mg by mouth daily.     STOOL SOFTENER 100 MG capsule  Generic drug:  docusate sodium  Take 100 mg by mouth 2 (two) times daily.     torsemide 20 MG tablet  Commonly known as:  DEMADEX  Take 2 tablets (40 mg total) by mouth 2 (two) times daily.     Travoprost (BAK Free) 0.004 % Soln ophthalmic solution  Commonly known as:  TRAVATAN  Place 1 drop into both eyes at  bedtime.        ALLERGIES:   Allergies  Allergen Reactions  . Aspirin Other (See Comments)    GI bleed/ PUD  . Darvocet [Propoxyphene N-Acetaminophen] Nausea And Vomiting  . Diphenhydramine Hcl Other (See Comments)    Glaucoma prevents patient from taking this medication   . Aloe Vera Rash  . Penicillins Swelling and Rash    BRIEF HPI:  See H&P, Labs, Consult and Test reports for all details in brief, patient is a 79 to female with a history of diastolic heart failure, chronic respiratory failure, pulmonary hypertension, morbid obesity who presented to the hospital for shortness of breath and increased bilateral leg swelling. She was then admitted for further evaluation and treatment.  CONSULTATIONS:   cardiology  PERTINENT RADIOLOGIC STUDIES: Dg Chest Portable 1 View  09/06/2013   CLINICAL DATA:  Short of breath for 1 week. Worsening shortness of breath.  EXAM: PORTABLE CHEST - 1 VIEW  COMPARISON:  DG CHEST 1V PORT dated 04/04/2013; DG CHEST 1V PORT dated 03/15/2013; DG CHEST 1V PORT dated 03/13/2013; DG CHEST 2 VIEW dated 03/08/2013  FINDINGS: Patient is rotated to the right. Oxygen tubing projects over the chest. The cardiopericardial silhouette appears enlarged. Pulmonary vascular congestion is present. There is a new right pleural effusion and right basilar atelectasis and partial collapse. Underlying airspace disease cannot be excluded. Mild left basilar atelectasis is present. Lucency is present in the retrocardiac area which is favored to be projectional although a hiatal hernia could produce this appearance as well.  IMPRESSION: New right pleural effusion and basilar collapse/consolidation. The appearance is similar to the prior exam 03/13/2013 and failure with asymmetric effusion and edema is favored. Pneumonia or aspiration or in the differential considerations.   Electronically Signed   By: Andreas Newport M.D.   On: 09/06/2013 19:48   Mm Digital Screening  09/07/2013   CLINICAL  DATA:  Screening.  EXAM: DIGITAL SCREENING BILATERAL MAMMOGRAM WITH CAD  COMPARISON:  Previous exam(s).  ACR Breast Density Category b: There are scattered areas of fibroglandular density.  FINDINGS: There are no findings suspicious for malignancy. Images were processed with CAD.  IMPRESSION: No mammographic evidence of malignancy. A result letter of this screening mammogram will be mailed directly to the patient.  RECOMMENDATION: Screening mammogram in one year. (Code:[id number])  BI-RADS CATEGORY  2: Benign Finding(s)   Electronically Signed   By: Gabriel Carina.D.  On: 09/07/2013 09:27     PERTINENT LAB RESULTS: CBC:  Recent Labs  09/15/13 0513  WBC 6.1  HGB 10.0*  HCT 34.6*  PLT 137*   CMET CMP     Component Value Date/Time   NA 138 09/15/2013 0513   K 4.3 09/15/2013 0513   CL 90* 09/15/2013 0513   CO2 33* 09/15/2013 0513   GLUCOSE 145* 09/15/2013 0513   BUN 36* 09/15/2013 0513   CREATININE 1.29* 09/15/2013 0513   CREATININE 1.43* 04/02/2013 1505   CALCIUM 10.0 09/15/2013 0513   PROT 7.0 09/06/2013 1825   ALBUMIN 4.1 09/06/2013 1825   AST 22 09/06/2013 1825   ALT 10 09/06/2013 1825   ALKPHOS 141* 09/06/2013 1825   BILITOT 0.5 09/06/2013 1825   GFRNONAA 39* 09/15/2013 0513   GFRAA 45* 09/15/2013 0513    GFR Estimated Creatinine Clearance: 38.6 ml/min (by C-G formula based on Cr of 1.29). No results found for this basename: LIPASE, AMYLASE,  in the last 72 hours No results found for this basename: CKTOTAL, CKMB, CKMBINDEX, TROPONINI,  in the last 72 hours No components found with this basename: POCBNP,  No results found for this basename: DDIMER,  in the last 72 hours No results found for this basename: HGBA1C,  in the last 72 hours No results found for this basename: CHOL, HDL, LDLCALC, TRIG, CHOLHDL, LDLDIRECT,  in the last 72 hours No results found for this basename: TSH, T4TOTAL, FREET3, T3FREE, THYROIDAB,  in the last 72 hours No results found for this basename: VITAMINB12,  FOLATE, FERRITIN, TIBC, IRON, RETICCTPCT,  in the last 72 hours Coags: No results found for this basename: PT, INR,  in the last 72 hours Microbiology: No results found for this or any previous visit (from the past 240 hour(s)).   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 Currently she is 14 L negative balance since admission. Weight has also decreased down to 93.1 kg from 101.6 kg on admission. Seen by cardiology on 1/21, current recommendations are to convert her to Demadex 40 mg twice a day, metolazone 5 mg every other day. Patient has been cleared for discharge. Patient has a followup appointment with cardiology as noted below.  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 Please monitor electrolytes periodically as an outpatient.   Hypokalemia - Secondary to diuretics, will be on potassium supplementation on discharge, continue to monitor electrolytes periodically as an outpatient.  Acute on chronic respiratory failure - Secondary to decompensated heart failure. Has chronic respiratory failure at baseline and uses oxygen at home. - Currently she is back to her usual baseline, continue home O2 on discharge. Please note, patient uses 4 L of oxygen via nasal cannula chronically.  Hypertension - Rate controlled, continue with the above-noted medications  Diabetes - CBGs stable during this hospital stay. Resume oral hypoglycemic agents on discharge. Further optimization to be done in the outpatient setting.  Obstructive sleep apnea - Continue CPAP on discharge  Morbid obesity - Counseled regarding importance of weight loss  History of peptic ulcer disease - Continue PPI  Anemia - Long-standing history of  microcytic anemia, hemoglobin stable throughout this hospital stay. - Patient was transfused 05/2013 with negative endoscopy. Per GI notes on report, it is  unlikely she would tolerate a colonscopy and a SBE would likely not show any treatable lesions thus empiric rx was recommended. No longer on ASA. On PPI for PUD  TODAY-DAY OF DISCHARGE:  Subjective:   [name] today has no headache,no chest abdominal pain,no new weakness tingling or numbness, feels much better wants to go home today.   Objective:   Blood pressure 102/59, pulse 101, temperature 98.2 F (36.8 C), temperature source Oral, resp. rate 18, height 5\' 1"  (1.549 m), weight 93.1 kg (205 lb 4 oz), SpO2 97.00%.  Intake/Output Summary (Last 24 hours) at 09/15/13 1139 Last data filed at 09/15/13 0700  Gross per 24 hour  Intake    480 ml  Output      0 ml  Net    480 ml   Filed Weights   09/13/13 0435 09/14/13 0627 09/15/13 0635  Weight: 94.983 kg (209 lb 6.4 oz) 93.35 kg (205 lb 12.8 oz) 93.1 kg (205 lb 4 oz)    Exam Awake Alert, Oriented *3, No new F.N deficits, Normal affect [place].AT,PERRAL Supple Neck,No JVD, No cervical lymphadenopathy appriciated.  Symmetrical Chest wall movement, Good air movement bilaterally, CTAB RRR,No Gallops,Rubs or new Murmurs, No Parasternal Heave +ve B.Sounds, Abd Soft, Non tender, No organomegaly appriciated, No rebound -guarding or rigidity. No Cyanosis, Clubbing or edema, No new Rash or bruise  DISCHARGE CONDITION: Stable  DISPOSITION: Home with home health services  DISCHARGE INSTRUCTIONS:    Activity:  As tolerated with Full fall precautions use walker/cane & assistance as needed  Diet recommendation: Diabetic Diet Heart Healthy diet   Discharge Orders   Future Appointments Provider Department Dept Phone   09/20/2013 2:00 PM [place] [phone]   11/11/2013 11:00 AM Pixie Casino, MD [facility]  [place] [phone]   Future Orders Complete By Expires   (HEART FAILURE PATIENTS) Call MD:  Anytime you have any of the following symptoms: 1) 3 pound weight gain in 24 hours or 5 pounds in 1 week 2) shortness of breath, with or without a dry hacking cough 3) swelling in the hands, feet or stomach 4) if you have to sleep on extra pillows at night in order to breathe.  As directed    Diet - low sodium heart healthy  As directed    Diet Carb Modified  As directed    Increase activity slowly  As directed       Follow-up Information   Follow up with Glori Bickers, MD On 09/20/2013. (@ 2:00 pm; [facility] code [id number])    Specialty:  Cardiology   Contact information:   [place] [address] [phone]       Follow up with Glo Herring., MD. Schedule an appointment as soon as possible for a visit in 1 week.   Specialty:  Internal Medicine   Contact information:   [address] [place] [address] [phone]      Total Time spent on discharge equals 45 minutes.  SignedOren Binet 09/15/2013 11:39 AM

## 2013-09-15 NOTE — Progress Notes (Addendum)
Primary cardiologist: Dr. Glori Bickers  Subjective:     Stable overnight. Lasix gtt increased to 15 mg/hr yesterday. Weight unchanged, 24 hr I/O -80 cc. SBP 90-100s. Cr stable 1.29. Denies SOB, orthopnea or CP.   Objective:   Temp:  [98 F (36.7 C)-98.1 F (36.7 C)] 98.1 F (36.7 C) (01/21 0635) Pulse Rate:  [88-104] 104 (01/21 0635) Resp:  [16-18] 18 (01/21 0635) BP: (99-102)/(53-62) 102/62 mmHg (01/21 0635) SpO2:  [95 %-96 %] 96 % (01/21 0635) Weight:  [205 lb 4 oz (93.1 kg)] 205 lb 4 oz (93.1 kg) (01/21 0635) Last BM Date: 09/13/13  Filed Weights   09/13/13 0435 09/14/13 0627 09/15/13 4782  Weight: 209 lb 6.4 oz (94.983 kg) 205 lb 12.8 oz (93.35 kg) 205 lb 4 oz (93.1 kg)    Intake/Output Summary (Last 24 hours) at 09/15/13 0723 Last data filed at 09/14/13 1630  Gross per 24 hour  Intake    720 ml  Output    800 ml  Net    -80 ml    Telemetry: Sinustach, with frequent PVCs 90s  Exam:  General: No distress; sitting in recliner  Lungs: CTA, nonlabored. JVP 8-9 cm   Cardiac: RRR, no gallop.  Abdomen: Protuberant, soft.  Extremities: 2+ edema bilateral LE   Lab Results:  Basic Metabolic Panel:  Recent Labs Lab 09/13/13 0345 09/14/13 0415 09/15/13 0513  NA 139 138 138  K 3.3* 3.3* 4.3  CL 88* 89* 90*  CO2 34* 33* 33*  GLUCOSE 128* 134* 145*  BUN 32* 35* 36*  CREATININE 1.35* 1.26* 1.29*  CALCIUM 9.9 9.6 10.0    Liver Function Tests: No results found for this basename: AST, ALT, ALKPHOS, BILITOT, PROT, ALBUMIN,  in the last 168 hours  CBC:  Recent Labs Lab 09/09/13 0557 09/12/13 0503 09/15/13 0513  WBC 6.5 5.8 6.1  HGB 9.7* 10.2* 10.0*  HCT 32.8* 34.3* 34.6*  MCV 64.8* 64.7* 65.7*  PLT 136* 144* 137*    Echocardiogram (1/14): Study Conclusions  - Left ventricle: Systolic function was normal. The estimated ejection fraction was in the range of 55% to 60%. Wall motion was normal; there were no regional wall motion abnormalities.  Due to tachycardia, there was fusion of early and atrial contributions to ventricular filling. The study is not technically sufficient to allow evaluation of LV diastolic function. - Ventricular septum: The contour showed systolic flattening. These changes are consistent with RV pressure overload. - Aortic valve: Trivial regurgitation. - Right ventricle: The cavity size was severely dilated. Systolic function was severely reduced. - Right atrium: The atrium was mildly dilated. - Tricuspid valve: Moderate regurgitation. - Pulmonary arteries: Systolic pressure was moderately to severely increased. PA peak pressure: 35mm Hg (S).   Medications:   Scheduled Medications: . ALPRAZolam  0.5 mg Oral BID  . brimonidine  1 drop Both Eyes Daily  . docusate sodium  100 mg Oral BID  . enoxaparin (LOVENOX) injection  40 mg Subcutaneous Q24H  . ezetimibe  10 mg Oral QHS  . glipiZIDE  10 mg Oral BID AC  . insulin aspart  0-15 Units Subcutaneous TID WC  . insulin aspart  0-5 Units Subcutaneous QHS  . ipratropium  0.5 mg Nebulization Once  . iron polysaccharides  150 mg Oral Daily  . latanoprost  1 drop Both Eyes QHS  . levothyroxine  150 mcg Oral QAC breakfast  . loratadine  10 mg Oral Daily  . metolazone  2.5 mg Oral BID  .  oxyCODONE-acetaminophen  1 tablet Oral BID  . pantoprazole  40 mg Oral BID  . polyethylene glycol  17 g Oral Daily  . potassium chloride SA  40 mEq Oral TID  . simvastatin  10 mg Oral q1800  . sodium chloride  3 mL Intravenous Q12H  . sodium chloride  3 mL Intravenous Q12H    Infusions: . furosemide (LASIX) infusion 15 mg/hr (09/14/13 2043)    PRN Medications: sodium chloride, sodium chloride, acetaminophen, bisacodyl, ondansetron (ZOFRAN) IV, sodium chloride, sodium chloride   Assessment:   1. Acute on chronic right heart failure/HFpEF due to severe pulmonary hypertension (PA pressure 74mmHg by echo 02/2013, normal cors 2011). Remains on lasix gtt.   2. Acute  renal insufficiency:   3. Acute on chronic respiratory failure on 4 liters [place] chronically.  4. Morbid obesity with OSA/OHS - Body mass index is 41.64 kg/(m^2).   5. CAP - ? Aspiration.  6. HTN. Better controlled.  7. Peptic ulcer disease.   8. Chronic anemia with thrombocytopenia this admission, microcytic, transfused 05/2013 with negative endoscopy. Per GI notes on report, it is unlikely she would tolerate a colonscopy and a SBE would likely not show any treatable lesions thus empiric rx was recommended. No longer on ASA. On PPI for PUD.   9. Diabetes mellitus    Plan/Discussion:    Stable overnight. Weight unchanged and 24 hr I/O -80 cc. She is down a total of 19 lbs. She is now ST so concerned we may have her dry now, however she still has bilateral LE 2+ edema. She may have venous insufficieny and have recommended she wear TED hose but she refuses. From HF standpoint ok to go home today.   Will place appointment on chart for HF clinic.  Meds to go home: Torsemide 40 mg BID Metolazone 5 mg QOD K+ 40 meq BID     Junie Bame B NP-C 7:23 AM  Advanced Heart Failure Team Pager [phone] (M-F; 7a - 4p)  Please contact [facility] for night-coverage after hours (4p -7a ) and weekends on [email or web address]  Patient seen with NP, agree with the above note.  She did not diurese vigorously yesterday.  However, she feels much better subjectively since admission. Overall weight is down a fair amount.  She has primarily right-sided heart failure in the setting of OHS/OSA.   I think that we can convert her over to po meds: would give torsemide 40 mg bid + metolazone 5 mg every other day for now, close followup in the office in 1 week.  May go home today.   Loralie Champagne 09/15/2013 8:05 AM

## 2013-09-16 ENCOUNTER — Telehealth: Payer: Self-pay | Admitting: Cardiovascular Disease

## 2013-09-16 MED ORDER — POTASSIUM CHLORIDE CRYS ER 20 MEQ PO TBCR: 40.0000 meq | EXTENDED_RELEASE_TABLET | Freq: Two times a day (BID) | ORAL | Status: AC

## 2013-09-16 MED ORDER — TORSEMIDE 20 MG PO TABS: 40.0000 mg | ORAL_TABLET | Freq: Two times a day (BID) | ORAL | Status: DC

## 2013-09-16 MED ORDER — METOLAZONE 5 MG PO TABS: 5.0000 mg | ORAL_TABLET | ORAL | Status: DC

## 2013-09-16 NOTE — Telephone Encounter (Signed)
Returned call to pharmacy.  Informed they received two Rxs for metolazone and that torsemide and K+ Rxs do not have a 30-day quantity.  Informed RN will review w/ provider and send electronically.  Verbalized understanding.  Tarri Fuller, PA-C notified and advised pt should be taking metolazone every other day 30 mins before torsemide.  Also advised to increase quantity for both torsemide and K+ since sufficient amount not given.  Rx(s) sent to pharmacy.

## 2013-09-16 NOTE — Telephone Encounter (Signed)
Please call-question about her Potassium prescription.

## 2013-09-20 ENCOUNTER — Inpatient Hospital Stay (HOSPITAL_COMMUNITY): Admit: 2013-09-20 | Payer: PRIVATE HEALTH INSURANCE

## 2013-09-24 ENCOUNTER — Encounter: Payer: Self-pay | Admitting: Cardiology

## 2013-09-24 ENCOUNTER — Ambulatory Visit (INDEPENDENT_AMBULATORY_CARE_PROVIDER_SITE_OTHER): Payer: PRIVATE HEALTH INSURANCE | Admitting: Cardiology

## 2013-09-24 VITALS — BP 100/60 | HR 90 | Ht 61.0 in | Wt 201.7 lb

## 2013-09-24 DIAGNOSIS — N183 Chronic kidney disease, stage 3 unspecified: Secondary | ICD-10-CM

## 2013-09-24 DIAGNOSIS — I2789 Other specified pulmonary heart diseases: Secondary | ICD-10-CM

## 2013-09-24 DIAGNOSIS — J9610 Chronic respiratory failure, unspecified whether with hypoxia or hypercapnia: Secondary | ICD-10-CM

## 2013-09-24 DIAGNOSIS — I272 Pulmonary hypertension, unspecified: Secondary | ICD-10-CM

## 2013-09-24 DIAGNOSIS — R0902 Hypoxemia: Secondary | ICD-10-CM

## 2013-09-24 DIAGNOSIS — I509 Heart failure, unspecified: Secondary | ICD-10-CM

## 2013-09-24 DIAGNOSIS — J9611 Chronic respiratory failure with hypoxia: Secondary | ICD-10-CM

## 2013-09-24 DIAGNOSIS — I5033 Acute on chronic diastolic (congestive) heart failure: Secondary | ICD-10-CM

## 2013-09-24 NOTE — Patient Instructions (Signed)
Stop Metolazone for now.   Heart Healthy low salt diet.   Follow up with Dr. Debara Pickett in 2 weeks.  I will see if you are to be seen in the Heart failure clinic at [facility].

## 2013-09-26 ENCOUNTER — Encounter: Payer: Self-pay | Admitting: Cardiology

## 2013-09-26 NOTE — Assessment & Plan Note (Addendum)
Recent hospitalization 09/06/13- 09/15/13 for acute HF secondary to Pul HTN.  She stopped Zaroxolyn secondary to dizziness and continued wt loss.  She currently prefers to see Dr. Debara Pickett rather than HF clinic.  She will follow up with Dr. Debara Pickett in 2 weeks.  If weight continues to drop or goes back up she will call us.

## 2013-09-26 NOTE — Assessment & Plan Note (Signed)
Significant.

## 2013-09-26 NOTE — Assessment & Plan Note (Signed)
Wears con't oxygen

## 2013-09-26 NOTE — Progress Notes (Signed)
09/26/2013   PCP: Glo Herring., MD   Chief Complaint  Patient presents with  . post hospital    patient was in the hospital for CHF. denies having any chest pain, or edema since discharge.    Primary Cardiologist:  Dr. Debara Pickett  HPI:  79 y.o. morbidly obese female with a history of right heart failure, pulmonary hypertension and obstructive sleep apnea, intolerant to CPAP. Wears oxygen 24/7 She has had multiple admissions this year for acute on chronic diastolic HF. Her last echo was on 09/08/2013, which demonstrated normal LV function, with an EF of 55-60% and previously she had grade I diastolic dysfunction.  PA peak pressure 67 mmHg.  She has had multiple hospitalizations for CHF.  She was seen by HF team on this recent admit.  She was to follow up with them but she prefers to see Dr. Debara Pickett.     Today she feels well, mild SOB with walking only.  No chest pain.  She stopped her metolazone on Monday for dizziness.  Her wt at discharge was 205 and now 197.  She is sleeping on her usual 2 pillows.  She is watching her salt intake- not eating any.       Allergies  Allergen Reactions  . Aspirin Other (See Comments)    GI bleed/ PUD  . Darvocet [Propoxyphene N-Acetaminophen] Nausea And Vomiting  . Diphenhydramine Hcl Other (See Comments)    Glaucoma prevents patient from taking this medication   . Aloe Vera Rash  . Penicillins Swelling and Rash    Current Outpatient Prescriptions  Medication Sig Dispense Refill  . ALPRAZolam (XANAX) 0.5 MG tablet Take 0.5 mg by mouth 2 (two) times daily. May take up to 4 times daily as prescribed      . bisacodyl (DULCOLAX) 10 MG suppository Place 10 mg rectally as needed for constipation.      . brimonidine (ALPHAGAN P) 0.1 % SOLN Place 1 drop into both eyes daily.      Marland Kitchen docusate sodium (STOOL SOFTENER) 100 MG capsule Take 100 mg by mouth 2 (two) times daily.      Marland Kitchen ezetimibe (ZETIA) 10 MG tablet Take 10 mg by mouth at bedtime.         . fexofenadine (ALLEGRA) 180 MG tablet Take 180 mg by mouth every morning.      Marland Kitchen glipiZIDE (GLUCOTROL) 10 MG tablet Take 10 mg by mouth 2 (two) times daily before a meal.        . iron polysaccharides (NIFEREX) 150 MG capsule Take 1 capsule (150 mg total) by mouth daily.  90 capsule  3  . lansoprazole (PREVACID) 30 MG capsule Take 1 capsule by mouth 2 (two) times daily.      Marland Kitchen levothyroxine (SYNTHROID, LEVOTHROID) 150 MCG tablet Take 150 mcg by mouth daily before breakfast.       . linagliptin (TRADJENTA) 5 MG TABS tablet Take 5 mg by mouth every morning.       . metolazone (ZAROXOLYN) 5 MG tablet Take 1 tablet (5 mg total) by mouth every other day. 30 mins before taking Torsemide.  30 tablet  1  . oxyCODONE-acetaminophen (PERCOCET/ROXICET) 5-325 MG per tablet Take 1 tablet by mouth 2 (two) times daily. May take 1 additional dose if needed for pain      . OXYGEN-HELIUM IN Inhale 4 L into the lungs continuous.      . polyethylene glycol (MIRALAX / GLYCOLAX) packet  Take 17 g by mouth as needed.       . potassium chloride SA (K-DUR,KLOR-CON) 20 MEQ tablet Take 2 tablets (40 mEq total) by mouth 2 (two) times daily.  120 tablet  2  . pravastatin (PRAVACHOL) 20 MG tablet Take 20 mg by mouth daily.      Marland Kitchen torsemide (DEMADEX) 20 MG tablet Take 2 tablets (40 mg total) by mouth 2 (two) times daily.  120 tablet  2  . Travoprost, BAK Free, (TRAVATAN) 0.004 % SOLN ophthalmic solution Place 1 drop into both eyes at bedtime.       No current facility-administered medications for this visit.    Past Medical History  Diagnosis Date  . Diabetes mellitus   . Hypertension   . Shingles   . GERD (gastroesophageal reflux disease)   . Abnormal heart rhythms   . Thyroid disease     hypthyroidism  . Pulmonary hypertension, moderate to severe     No evidence of CAD on Cath 2011  . Obesity, morbid (more than 100 lbs over ideal weight or BMI > 40)   . Chronic respiratory failure with hypoxia 01/23/2012     Related to OHS  . Glaucoma   . CHF (congestive heart failure)     Echo at [facility] 10/14/2012 see notes  . Acute on chronic renal failure   . COPD (chronic obstructive pulmonary disease)     Past Surgical History  Procedure Laterality Date  . Tubal ligation    . Ovary removed    . Knee arthroscopy      left  . Cardiovascular stress test  09/15/2006    EF 81%; normal perfusion all regions; LV normal in size; no scintigraphic evidence of inducible myocardial ischemia  . Cardiac catheterization  02/21/2010    EF 65%; PA pressure 86/21 with mean of 44; normal coronary arteries, primary pumonary hypertension; no significant wall motion abnormalities  . Esophagogastroduodenoscopy N/A 06/25/2013    Procedure: ESOPHAGOGASTRODUODENOSCOPY (EGD);  Surgeon: Cleotis Nipper, MD;  Location: [facility] ENDOSCOPY;  Service: Endoscopy;  Laterality: N/A;  pediatric upper endoscope; no sedation    AYT:[id number]:NA colds or fevers,  weight loss Skin:no rashes or ulcers HEENT:no blurred vision, no congestion CV:see HPI PUL:see HPI GI:no diarrhea constipation or melena, no indigestion GU:no hematuria, no dysuria MS:no joint pain, no claudication Neuro:no syncope, no lightheadedness Endo:+ diabetes, no thyroid disease  PHYSICAL EXAM BP 100/60  Pulse 90  Ht 5\' 1"  (1.549 m)  Wt 201 lb 11.2 oz (91.491 kg)  BMI 38.13 kg/m2 General:Pleasant affect, NAD, in wheel chair Skin:Warm and dry, brisk capillary refill HEENT:normocephalic, sclera clear, mucus membranes moist, wearing oxygen Neck:supple, no JVD, no bruits  Heart:S1S2 RRR without murmur, gallup, rub or click Lungs:clear without rales, rhonchi, or wheezes TFT:DDUK, non tender, + BS, do not palpate liver spleen or masses Ext:no  To trace lower ext edema, 2+ pedal pulses, 2+ radial pulses Neuro:alert and oriented, MAE, follows commands, + facial symmetry  EKG:SR at 90 with occ PAC  Deep t wave inversion in ant. Lat leads. Similar to previous EKGs.  ASSESSMENT  AND PLAN Acute on chronic diastolic heart failure Recent hospitalization 09/06/13- 09/15/13 for acute HF secondary to Pul HTN.  She stopped Zaroxolyn secondary to dizziness and continued wt loss.  She currently prefers to see Dr. Debara Pickett rather than HF clinic.  She will follow up with Dr. Debara Pickett in 2 weeks.  If weight continues to drop or goes back up she will call us.  Chronic respiratory failure with hypoxia Wears con't oxygen  Pulmonary hypertension Significant.

## 2013-10-11 ENCOUNTER — Ambulatory Visit: Payer: PRIVATE HEALTH INSURANCE | Admitting: Internal Medicine

## 2013-10-25 ENCOUNTER — Encounter: Payer: Self-pay | Admitting: Internal Medicine

## 2013-10-25 ENCOUNTER — Ambulatory Visit (INDEPENDENT_AMBULATORY_CARE_PROVIDER_SITE_OTHER): Payer: PRIVATE HEALTH INSURANCE | Admitting: Internal Medicine

## 2013-10-25 VITALS — BP 118/78 | HR 96 | Ht 61.0 in | Wt 203.3 lb

## 2013-10-25 DIAGNOSIS — IMO0001 Reserved for inherently not codable concepts without codable children: Secondary | ICD-10-CM

## 2013-10-25 DIAGNOSIS — I2729 Other secondary pulmonary hypertension: Secondary | ICD-10-CM

## 2013-10-25 DIAGNOSIS — J9611 Chronic respiratory failure with hypoxia: Secondary | ICD-10-CM

## 2013-10-25 DIAGNOSIS — I2789 Other specified pulmonary heart diseases: Secondary | ICD-10-CM

## 2013-10-25 DIAGNOSIS — Z0389 Encounter for observation for other suspected diseases and conditions ruled out: Secondary | ICD-10-CM

## 2013-10-25 DIAGNOSIS — I50810 Right heart failure, unspecified: Secondary | ICD-10-CM

## 2013-10-25 DIAGNOSIS — J9610 Chronic respiratory failure, unspecified whether with hypoxia or hypercapnia: Secondary | ICD-10-CM

## 2013-10-25 DIAGNOSIS — I5032 Chronic diastolic (congestive) heart failure: Secondary | ICD-10-CM

## 2013-10-25 DIAGNOSIS — R0902 Hypoxemia: Secondary | ICD-10-CM

## 2013-10-25 DIAGNOSIS — I272 Pulmonary hypertension, unspecified: Secondary | ICD-10-CM

## 2013-10-25 DIAGNOSIS — I509 Heart failure, unspecified: Secondary | ICD-10-CM

## 2013-10-25 NOTE — Patient Instructions (Signed)
Your physician recommends that you schedule a follow-up appointment in: 3 months.  

## 2013-10-25 NOTE — Progress Notes (Signed)
10/25/2013   PCP: Glo Herring., MD   Chief Complaint  Patient presents with  . Follow-up    1 month - saw laura 1/30 - reports DOE; tonsillitis/ear problems    Primary Cardiologist: Dr. Debara Pickett  HPI:  79 y/o, morbidly obese female with a history of right heart failure, pulmonary hypertension and obstructive sleep apnea, intolerant to CPAP. She has had multiple admissions this year for acute on chronic diastolic HF. Her last echo was on 01/12/2013, which demonstrated normal LV function, with an EF of 65-70% and grade I diastolic dysfunction. She presented back to [facility] July 15 with a complaint of progressive resting dyspnea and bilateral lower extremity edema. She noted associated weight gain, orthopnea and PND. She denied any chest pain. She was diuresed, and improved. Dry weight was thought to be 215 pounds.   She was diuresed 11 liters in the hospital. At discharge he was to be on Bumex 3 mg daily and Zaroxolyn 5 mg every morning the Cozaar was held secondary to hypotension. Also she continued with home oxygen.  Unfortunately Bumex was not being made so she was placed on Demadex 60 mg twice a day.Since discharge she has done quite well, her weight dropped quite dramatically to 199 pounds.  She feels much better not we can all objects and feels like her energy has increased somewhat. At her home her weight has been 196.  She denies shortness of breath or chest pain and she does wear oxygen.  Repeat laboratory work was performed and it demonstrated probable dehydration or over diuresis. After being seen by Cecilie Kicks, one of our nurse practitioners, she recommended decreasing her diuretics which ever occurred. Unfortunately she re\re presented to the hospital in dehydration. Her medications were adjusted and she was hypotensive, therefore her blood pressure medications were stopped. Today she returns from that hospital followup and is on a lower dose of diuretics. Again her blood pressure  medicines are stopped, and I suspect she was on higher doses of blood pressure medicines due to the very high-volume and her pulmonary hypertension. She reports doing fairly well with a stable weight at home.   [name] was recently admitted for acute GI bleeding. Her aspirin and and Plavix were discontinued. I actually do not see a clear indication for either medicine they suspected she had normal coronaries. She did undergo endoscopy by Dr. Cristina Gong, which did not show a clear source of her bleeding.  She appears to be clinically compensated with regard to her right heart failure.  I've reviewed daily weights performed by her home health nurse through [facility]. This demonstrates her weight has been very stable within 1-2 pounds over the past week. Of note she has started taking low-dose iron over-the-counter.  I had switched her to Niferex. She then followed up with Mickel Baas our nurse practitioner who felt she was doing well and her weight was stable at 201. She continues to get home health care and her weight remains stable without any worsening shortness of breath. There is no new GI bleeding.  Allergies  Allergen Reactions  . Aspirin Other (See Comments)    GI bleed/ PUD  . Darvocet [Propoxyphene N-Acetaminophen] Nausea And Vomiting  . Diphenhydramine Hcl Other (See Comments)    Glaucoma prevents patient from taking this medication   . Aloe Vera Rash  . Penicillins Swelling and Rash    Current Outpatient Prescriptions  Medication Sig Dispense Refill  . ALPRAZolam (XANAX) 0.5 MG  tablet Take 0.5 mg by mouth 2 (two) times daily. May take up to 4 times daily as prescribed      . bisacodyl (DULCOLAX) 10 MG suppository Place 10 mg rectally as needed for constipation.      . brimonidine (ALPHAGAN P) 0.1 % SOLN Place 1 drop into both eyes daily.      Marland Kitchen docusate sodium (STOOL SOFTENER) 100 MG capsule Take 100 mg by mouth 2 (two) times daily.      Marland Kitchen ezetimibe (ZETIA) 10 MG tablet Take  10 mg by mouth at bedtime.        . fexofenadine (ALLEGRA) 180 MG tablet Take 180 mg by mouth every morning.      Marland Kitchen glipiZIDE (GLUCOTROL) 10 MG tablet Take 10 mg by mouth 2 (two) times daily before a meal.        . iron polysaccharides (NIFEREX) 150 MG capsule Take 1 capsule (150 mg total) by mouth daily.  90 capsule  3  . lansoprazole (PREVACID) 30 MG capsule Take 1 capsule by mouth 2 (two) times daily.      Marland Kitchen levothyroxine (SYNTHROID, LEVOTHROID) 150 MCG tablet Take 150 mcg by mouth daily before breakfast.       . linagliptin (TRADJENTA) 5 MG TABS tablet Take 5 mg by mouth every morning.       Marland Kitchen oxyCODONE-acetaminophen (PERCOCET/ROXICET) 5-325 MG per tablet Take 1 tablet by mouth 2 (two) times daily. May take 1 additional dose if needed for pain      . OXYGEN-HELIUM IN Inhale 4 L into the lungs continuous.      . pantoprazole (PROTONIX) 40 MG tablet Take 1 tablet by mouth 2 (two) times daily.      . polyethylene glycol (MIRALAX / GLYCOLAX) packet Take 17 g by mouth as needed.       . potassium chloride SA (K-DUR,KLOR-CON) 20 MEQ tablet Take 2 tablets (40 mEq total) by mouth 2 (two) times daily.  120 tablet  2  . pravastatin (PRAVACHOL) 20 MG tablet Take 20 mg by mouth daily.      Marland Kitchen torsemide (DEMADEX) 20 MG tablet Take 2 tablets (40 mg total) by mouth 2 (two) times daily.  120 tablet  2  . Travoprost, BAK Free, (TRAVATAN) 0.004 % SOLN ophthalmic solution Place 1 drop into both eyes at bedtime.       No current facility-administered medications for this visit.    Past Medical History  Diagnosis Date  . Diabetes mellitus   . Hypertension   . Shingles   . GERD (gastroesophageal reflux disease)   . Abnormal heart rhythms   . Thyroid disease     hypthyroidism  . Pulmonary hypertension, moderate to severe     No evidence of CAD on Cath 2011  . Obesity, morbid (more than 100 lbs over ideal weight or BMI > 40)   . Chronic respiratory failure with hypoxia 01/23/2012    Related to OHS  .  Glaucoma   . CHF (congestive heart failure)     Echo at [facility] 10/14/2012 see notes  . Acute on chronic renal failure   . COPD (chronic obstructive pulmonary disease)     Past Surgical History  Procedure Laterality Date  . Tubal ligation    . Ovary removed    . Knee arthroscopy      left  . Cardiovascular stress test  09/15/2006    EF 81%; normal perfusion all regions; LV normal in size; no scintigraphic evidence of inducible  myocardial ischemia  . Cardiac catheterization  02/21/2010    EF 65%; PA pressure 86/21 with mean of 44; normal coronary arteries, primary pumonary hypertension; no significant wall motion abnormalities  . Esophagogastroduodenoscopy N/A 06/25/2013    Procedure: ESOPHAGOGASTRODUODENOSCOPY (EGD);  Surgeon: Cleotis Nipper, MD;  Location: [facility] ENDOSCOPY;  Service: Endoscopy;  Laterality: N/A;  pediatric upper endoscope; no sedation    GLO:[id number]:RJ colds or fevers, significant weight changes Skin:no rashes or ulcers HEENT:no blurred vision, no congestion CV:see HPI PUL:see HPI GI:no diarrhea constipation or melena, no indigestion GU:no hematuria, no dysuria MS:no joint pain, no claudication Neuro:no syncope, no lightheadedness Endo:+ diabetes, + thyroid disease  PHYSICAL EXAM BP 118/78  Pulse 96  Ht 5\' 1"  (1.549 m)  Wt 203 lb 4.8 oz (92.216 kg)  BMI 38.43 kg/m2 GEN: Awake, on oxygen, in no distress HEENT: PERRLA, EOMI, dry mucous membranes Neck: No jugular venous distention Heart: Regular rate and rhythm, normal S1-S2, no murmurs rubs or gallops Lungs: Decreased breath sounds bilaterally, no rales rhonchi or wheezes Abdomen: Grossly obese, soft, nontender, no fluid wave Extremities: Trace to mild bilateral pedal edema Pulses: 1+ bilaterally Neurologic: Grossly nonfocal Skin: Improved skin color Psych: Mildly anxious  EKG:  deferred  ASSESSMENT AND PLAN Patient Active Problem List   Diagnosis Date Noted  . Acute on chronic diastolic heart failure  18/84/1660  . Pulmonary hypertension 09/08/2013  . CAP (community acquired pneumonia) 09/06/2013  . PUD (peptic ulcer disease)- ASA stopped 06/26/2013  . Acute blood loss anemia- transfused 10/14- negative endoscopy 06/25/2013  . Hypokalemia 04/05/2013  . Hyponatremia 04/05/2013  . Thrombocytopenia, unspecified 04/05/2013  . Volume depletion 04/04/2013  . Weakness 04/04/2013  . Acute renal failure 04/04/2013  . Hypotension 12/25/2012  . Diastolic CHF, chronic- EF 65-70% May 2014 12/25/2012  . Unspecified hypothyroidism 10/14/2012  . Normal coronary arteries at cath 2011 10/14/2012  . Obesity, morbid (more than 100 lbs over ideal weight or BMI > 40)   . Pulmonary hypertension, Severe by Echo 02/1713-pa pressure 74 mmHg   . 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 DM2 (diabetes mellitus, type 2) 10/13/2012  . Chronic respiratory failure with hypoxia 01/23/2012   Plan: 1.  Mrs. Wax appears stable on her current dose of diuretic. She continues to have stable breathlessness on continuous 4 L oxygen given her history of pulmonary hypertension There are no signs of ongoing GI bleeding. Given her multiple comorbidities I would like to followup closely with her and plan to see her back in 3 months.   Pixie Casino, MD, [facility] Attending Cardiologist [facility]

## 2013-11-11 ENCOUNTER — Ambulatory Visit: Payer: Medicare Other | Admitting: Internal Medicine

## 2013-12-28 ENCOUNTER — Other Ambulatory Visit (HOSPITAL_COMMUNITY): Payer: Self-pay | Admitting: Cardiology

## 2013-12-28 NOTE — Telephone Encounter (Signed)
Rx was sent to pharmacy electronically. 

## 2014-01-19 ENCOUNTER — Ambulatory Visit (HOSPITAL_COMMUNITY)
Admission: RE | Admit: 2014-01-19 | Discharge: 2014-01-19 | Disposition: A | Discharge status: Home / Self Care | Payer: PRIVATE HEALTH INSURANCE | Source: Ambulatory Visit | Attending: Family Medicine | Admitting: Family Medicine

## 2014-01-19 ENCOUNTER — Other Ambulatory Visit (HOSPITAL_COMMUNITY): Payer: Self-pay | Admitting: Family Medicine

## 2014-01-19 DIAGNOSIS — R059 Cough, unspecified: Secondary | ICD-10-CM

## 2014-01-19 DIAGNOSIS — R05 Cough: Secondary | ICD-10-CM

## 2014-01-19 DIAGNOSIS — I517 Cardiomegaly: Secondary | ICD-10-CM | POA: Insufficient documentation

## 2014-01-19 DIAGNOSIS — R0602 Shortness of breath: Secondary | ICD-10-CM | POA: Insufficient documentation

## 2014-01-25 ENCOUNTER — Encounter: Payer: Self-pay | Admitting: Internal Medicine

## 2014-01-25 ENCOUNTER — Ambulatory Visit (INDEPENDENT_AMBULATORY_CARE_PROVIDER_SITE_OTHER): Payer: PRIVATE HEALTH INSURANCE | Admitting: Internal Medicine

## 2014-01-25 VITALS — BP 134/74 | HR 109 | Ht 61.0 in | Wt 217.5 lb

## 2014-01-25 DIAGNOSIS — R0602 Shortness of breath: Secondary | ICD-10-CM

## 2014-01-25 DIAGNOSIS — I2789 Other specified pulmonary heart diseases: Secondary | ICD-10-CM

## 2014-01-25 DIAGNOSIS — Z79899 Other long term (current) drug therapy: Secondary | ICD-10-CM

## 2014-01-25 DIAGNOSIS — I2729 Other secondary pulmonary hypertension: Secondary | ICD-10-CM

## 2014-01-25 DIAGNOSIS — I272 Pulmonary hypertension, unspecified: Secondary | ICD-10-CM

## 2014-01-25 DIAGNOSIS — I50810 Right heart failure, unspecified: Secondary | ICD-10-CM

## 2014-01-25 DIAGNOSIS — I509 Heart failure, unspecified: Secondary | ICD-10-CM

## 2014-01-25 DIAGNOSIS — I5033 Acute on chronic diastolic (congestive) heart failure: Secondary | ICD-10-CM

## 2014-01-25 MED ORDER — TORSEMIDE 20 MG PO TABS: 60.0000 mg | ORAL_TABLET | Freq: Two times a day (BID) | ORAL | Status: DC

## 2014-01-25 MED ORDER — METOLAZONE 2.5 MG PO TABS: ORAL_TABLET | ORAL | Status: DC

## 2014-01-25 NOTE — Progress Notes (Signed)
01/25/2014   PCP: Cassell Smiles., MD   Chief Complaint  Patient presents with  . 3 month visit    went to PCP r/t possible UTI?; reports worsening SOB since Sunday and hasn't been able to pee well; patient reports abdoominal swelling; wears 4L O2 at home  . Shortness of Breath    walked in from parking lot - intital O2 sat on 5L oxygen was 81%, turned O2 up to 6L and sats increased to 93%, turned O2 back to 4L (baseline)    Primary Cardiologist: Dr. Rennis Golden  HPI:  79 y/o, morbidly obese female with a history of right heart failure, pulmonary hypertension and obstructive sleep apnea, intolerant to CPAP. She has had multiple admissions this year for acute on chronic diastolic HF. Her last echo was on 01/12/2013, which demonstrated normal LV function, with an EF of 65-70% and grade I diastolic dysfunction. She presented back to [facility] July 15 with a complaint of progressive resting dyspnea and bilateral lower extremity edema. She noted associated weight gain, orthopnea and PND. She denied any chest pain. She was diuresed, and improved. Dry weight was thought to be 215 pounds.   She was diuresed 11 liters in the hospital. At discharge he was to be on Bumex 3 mg daily and Zaroxolyn 5 mg every morning the Cozaar was held secondary to hypotension. Also she continued with home oxygen.  Unfortunately Bumex was not being made so she was placed on Demadex 60 mg twice a day.Since discharge she has done quite well, her weight dropped quite dramatically to 199 pounds.  She feels much better not we can all objects and feels like her energy has increased somewhat. At her home her weight has been 196.  She denies shortness of breath or chest pain and she does wear oxygen.  Repeat laboratory work was performed and it demonstrated probable dehydration or over diuresis. After being seen by Nada Boozer, one of our nurse practitioners, she recommended decreasing her diuretics which ever occurred. Unfortunately she re\re  presented to the hospital in dehydration. Her medications were adjusted and she was hypotensive, therefore her blood pressure medications were stopped. Today she returns from that hospital followup and is on a lower dose of diuretics. Again her blood pressure medicines are stopped, and I suspect she was on higher doses of blood pressure medicines due to the very high-volume and her pulmonary hypertension. She reports doing fairly well with a stable weight at home.   Ms. Lindsley was recently admitted for acute GI bleeding. Her aspirin and and Plavix were discontinued. I actually do not see a clear indication for either medicine they suspected she had normal coronaries. She did undergo endoscopy by Dr. Matthias Hughs, which did not show a clear source of her bleeding.  She appears to be clinically compensated with regard to her right heart failure.  I've reviewed daily weights [facility]. This demonstrates her weight has been very stable within 1-2 pounds over the past week. Of note she has started taking low-dose iron over-the-counter.  I had switched her to Niferex. She then followed up with Vernona Rieger our nurse practitioner who felt she was doing well and her weight was stable at 201. She continues to get home health care and her weight remains stable without any worsening shortness of breath. There is no new GI bleeding.  Mrs. Zebrowski follows up today and has been having increased shortness of breath over the past  week. She is now up 17 pounds since her last dry weight. She reports her appetite next is decreased. Her home how services have now been discontinued. She reports decreased urine output and some difficulty with urination and recently saw primary care provider where a urinalysis was performed and it was normal. She's also had lower oxygen levels at home and is markedly short of breath with exertion.  Allergies  Allergen Reactions  . Aspirin Other (See  Comments)    GI bleed/ PUD  . Darvocet [Propoxyphene N-Acetaminophen] Nausea And Vomiting  . Diphenhydramine Hcl Other (See Comments)    Glaucoma prevents patient from taking this medication   . Aloe Vera Rash  . Penicillins Swelling and Rash    Current Outpatient Prescriptions  Medication Sig Dispense Refill  . ALPRAZolam (XANAX) 0.5 MG tablet Take 0.5 mg by mouth 2 (two) times daily. May take up to 4 times daily as prescribed      . bisacodyl (DULCOLAX) 10 MG suppository Place 10 mg rectally as needed for constipation.      . brimonidine (ALPHAGAN P) 0.1 % SOLN Place 1 drop into both eyes daily.      Marland Kitchen docusate sodium (STOOL SOFTENER) 100 MG capsule Take 100 mg by mouth 2 (two) times daily.      Marland Kitchen ezetimibe (ZETIA) 10 MG tablet Take 10 mg by mouth at bedtime.        . fexofenadine (ALLEGRA) 180 MG tablet Take 180 mg by mouth every morning.      Marland Kitchen glipiZIDE (GLUCOTROL) 10 MG tablet Take 10 mg by mouth 2 (two) times daily before a meal.        . iron polysaccharides (NIFEREX) 150 MG capsule Take 1 capsule (150 mg total) by mouth daily.  90 capsule  3  . lansoprazole (PREVACID) 30 MG capsule Take 1 capsule by mouth 2 (two) times daily.      Marland Kitchen levothyroxine (SYNTHROID, LEVOTHROID) 150 MCG tablet Take 150 mcg by mouth daily before breakfast.       . linagliptin (TRADJENTA) 5 MG TABS tablet Take 5 mg by mouth every morning.       Marland Kitchen oxyCODONE-acetaminophen (PERCOCET/ROXICET) 5-325 MG per tablet Take 1 tablet by mouth 2 (two) times daily. May take 1 additional dose if needed for pain      . OXYGEN-HELIUM IN Inhale 4 L into the lungs continuous.      . pantoprazole (PROTONIX) 40 MG tablet TAKE ONE TABLET TWICE DAILY  60 tablet  9  . polyethylene glycol (MIRALAX / GLYCOLAX) packet Take 17 g by mouth as needed.       . potassium chloride SA (K-DUR,KLOR-CON) 20 MEQ tablet Take 2 tablets (40 mEq total) by mouth 2 (two) times daily.  120 tablet  2  . pravastatin (PRAVACHOL) 20 MG tablet Take 20 mg by  mouth daily.      Marland Kitchen torsemide (DEMADEX) 20 MG tablet Take 3 tablets (60 mg total) by mouth 2 (two) times daily.  180 tablet  2  . Travoprost, BAK Free, (TRAVATAN) 0.004 % SOLN ophthalmic solution Place 1 drop into both eyes at bedtime.      . metolazone (ZAROXOLYN) 2.5 MG tablet Take 1 tablet by mouth on Mondays, Wednesdays, Friday. Take 30 minutes prior to AM dose of torsemide.  12 tablet  3   No current facility-administered medications for this visit.    Past Medical History  Diagnosis Date  . Diabetes mellitus   . Hypertension   .  Shingles   . GERD (gastroesophageal reflux disease)   . Abnormal heart rhythms   . Thyroid disease     hypthyroidism  . Pulmonary hypertension, moderate to severe     No evidence of CAD on Cath 2011  . Obesity, morbid (more than 100 lbs over ideal weight or BMI > 40)   . Chronic respiratory failure with hypoxia 01/23/2012    Related to OHS  . Glaucoma   . CHF (congestive heart failure)     Echo at [facility] 10/14/2012 see notes  . Acute on chronic renal failure   . COPD (chronic obstructive pulmonary disease)     Past Surgical History  Procedure Laterality Date  . Tubal ligation    . Ovary removed    . Knee arthroscopy      left  . Cardiovascular stress test  09/15/2006    EF 81%; normal perfusion all regions; LV normal in size; no scintigraphic evidence of inducible myocardial ischemia  . Cardiac catheterization  02/21/2010    EF 65%; PA pressure 86/21 with mean of 44; normal coronary arteries, primary pumonary hypertension; no significant wall motion abnormalities  . Esophagogastroduodenoscopy N/A 06/25/2013    Procedure: ESOPHAGOGASTRODUODENOSCOPY (EGD);  Surgeon: Cleotis Nipper, MD;  Location: [facility] ENDOSCOPY;  Service: Endoscopy;  Laterality: N/A;  pediatric upper endoscope; no sedation    NIO:[id number]:XF colds or fevers, significant weight changes Skin:no rashes or ulcers HEENT:no blurred vision, no congestion CV:see HPI PUL:see HPI GI:no  diarrhea constipation or melena, no indigestion GU:no hematuria, no dysuria MS:no joint pain, no claudication Neuro:no syncope, no lightheadedness Endo:+ diabetes, + thyroid disease  PHYSICAL EXAM BP 134/74  Pulse 109  Ht 5\' 1"  (1.549 m)  Wt 217 lb 8 oz (98.657 kg)  BMI 41.12 kg/m2  SpO2 95% GEN: Awake, on oxygen, notable dyspnea HEENT: PERRLA, EOMI, dry mucous membranes Neck: JVP is elevated ~3 cm above sternal notch Heart: Regular rate and rhythm, normal S1-S2, no murmurs rubs or gallops Lungs: Decreased breath sounds bilaterally, no rales rhonchi or wheezes Abdomen: Grossly obese, soft, nontender, abdominal distention Extremities: Trace to 1+ bilateral pedal edema Pulses: 1+ bilaterally Neurologic: Grossly nonfocal Skin: Improved skin color Psych: Mildly anxious  EKG:  Sinus tachycardia 109, RVH by voltage  ASSESSMENT AND PLAN Patient Active Problem List   Diagnosis Date Noted  . Acute on chronic diastolic heart failure 81/82/9937  . Pulmonary hypertension 09/08/2013  . CAP (community acquired pneumonia) 09/06/2013  . PUD (peptic ulcer disease)- ASA stopped 06/26/2013  . Acute blood loss anemia- transfused 10/14- negative endoscopy 06/25/2013  . Hypokalemia 04/05/2013  . Hyponatremia 04/05/2013  . Thrombocytopenia, unspecified 04/05/2013  . Volume depletion 04/04/2013  . Weakness 04/04/2013  . Acute renal failure 04/04/2013  . Hypotension 12/25/2012  . Diastolic CHF, chronic- EF 65-70% May 2014 12/25/2012  . Unspecified hypothyroidism 10/14/2012  . Normal coronary arteries at cath 2011 10/14/2012  . Obesity, morbid (more than 100 lbs over ideal weight or BMI > 40)   . Pulmonary hypertension, Severe by Echo 02/1713-pa pressure 74 mmHg   . 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 DM2 (diabetes mellitus, type 2) 10/13/2012  . Chronic respiratory failure with hypoxia 01/23/2012   Plan: 1.  Mrs. Horn is  once again gaining fluid. She is now up 17 pounds from her dry weight and reports her appetite has gone down. There is signs of increased swelling mostly increasing abdominal girth and lower extremity swelling.  She is reportedly more short of breath and is hypoxic off of oxygen. I am doubtful that she can diuresis an outpatient but she does not want to be admitted at this time. I would recommend increasing her torsemide to 60 mg twice daily and adding metolazone 2.5 mg q. Monday, Wednesday, Friday. I've instructed my nurse Eliezer Lofts to contact her on Thursday to review her daily weights. If her weight continues to go up she may need to be admitted to the hospital. Unfortunately are not in the office later this week and they'll followup can be arranged due to full office appointments. I will try to schedule her as an add-on in the office on Monday.   Pixie Casino, MD, [facility] [employer] Attending Cardiologist [facility] [place]

## 2014-01-25 NOTE — Patient Instructions (Signed)
Monitor your weights daily (same time each day) We will call you on Thursday to see how your weights are and if your breathing is better.    Dr Debara Pickett wants you to see him on Monday June 8th. (OK to add on to schedule)  Please increase torsemide to 60mg  twice daily.  Please add a medication - metolazone 2.5mg .  - take this on Monday, Wednesday, Friday (30 minutes prior to AM dose of torsemide)  Please have labs TODAY

## 2014-01-26 LAB — COMPREHENSIVE METABOLIC PANEL
ALK PHOS: 149 U/L — AB (ref 39–117)
ALT: 9 U/L (ref 0–35)
AST: 19 U/L (ref 0–37)
Albumin: 4.1 g/dL (ref 3.5–5.2)
BUN: 20 mg/dL (ref 6–23)
CALCIUM: 9.2 mg/dL (ref 8.4–10.5)
CHLORIDE: 101 meq/L (ref 96–112)
CO2: 25 meq/L (ref 19–32)
Creat: 1.34 mg/dL — ABNORMAL HIGH (ref 0.50–1.10)
GLUCOSE: 144 mg/dL — AB (ref 70–99)
POTASSIUM: 3.7 meq/L (ref 3.5–5.3)
Sodium: 138 mEq/L (ref 135–145)
TOTAL PROTEIN: 6.3 g/dL (ref 6.0–8.3)
Total Bilirubin: 0.6 mg/dL (ref 0.2–1.2)

## 2014-01-26 LAB — BRAIN NATRIURETIC PEPTIDE: BRAIN NATRIURETIC PEPTIDE: 867.4 pg/mL — AB (ref 0.0–100.0)

## 2014-01-27 ENCOUNTER — Telehealth: Payer: Self-pay | Admitting: *Deleted

## 2014-01-27 NOTE — Telephone Encounter (Signed)
Patient notified that labs were OK - no need to change medications Yesterday weight 213 lbs Today weight 208 lbs Breathing has improved, has been using bathroom more frequently  Will keep medications same and f/up on 6/8 with Dr. Debara Pickett

## 2014-01-31 ENCOUNTER — Ambulatory Visit (INDEPENDENT_AMBULATORY_CARE_PROVIDER_SITE_OTHER): Payer: PRIVATE HEALTH INSURANCE | Admitting: Internal Medicine

## 2014-01-31 ENCOUNTER — Encounter: Payer: Self-pay | Admitting: Internal Medicine

## 2014-01-31 VITALS — BP 130/77 | HR 93 | Ht 61.0 in | Wt 201.5 lb

## 2014-01-31 DIAGNOSIS — I2789 Other specified pulmonary heart diseases: Secondary | ICD-10-CM

## 2014-01-31 DIAGNOSIS — I272 Pulmonary hypertension, unspecified: Secondary | ICD-10-CM

## 2014-01-31 DIAGNOSIS — I509 Heart failure, unspecified: Secondary | ICD-10-CM

## 2014-01-31 DIAGNOSIS — I50810 Right heart failure, unspecified: Principal | ICD-10-CM

## 2014-01-31 DIAGNOSIS — I5033 Acute on chronic diastolic (congestive) heart failure: Secondary | ICD-10-CM

## 2014-01-31 DIAGNOSIS — I2729 Other secondary pulmonary hypertension: Secondary | ICD-10-CM

## 2014-01-31 DIAGNOSIS — I959 Hypotension, unspecified: Secondary | ICD-10-CM

## 2014-01-31 DIAGNOSIS — I5032 Chronic diastolic (congestive) heart failure: Secondary | ICD-10-CM

## 2014-01-31 NOTE — Progress Notes (Signed)
01/31/2014   PCP: Glo Herring., MD   Chief Complaint  Patient presents with  . Follow-up    1 week ago - breathing improved!    Primary Cardiologist: Dr. Debara Pickett  HPI:  79 y/o, morbidly obese female with a history of right heart failure, pulmonary hypertension and obstructive sleep apnea, intolerant to CPAP. She has had multiple admissions this year for acute on chronic diastolic HF. Her last echo was on 01/12/2013, which demonstrated normal LV function, with an EF of 65-70% and grade I diastolic dysfunction. She presented back to [facility] July 15 with a complaint of progressive resting dyspnea and bilateral lower extremity edema. She noted associated weight gain, orthopnea and PND. She denied any chest pain. She was diuresed, and improved. Dry weight was thought to be 215 pounds.   She was diuresed 11 liters in the hospital. At discharge he was to be on Bumex 3 mg daily and Zaroxolyn 5 mg every morning the Cozaar was held secondary to hypotension. Also she continued with home oxygen.  Unfortunately Bumex was not being made so she was placed on Demadex 60 mg twice a day.Since discharge she has done quite well, her weight dropped quite dramatically to 199 pounds.  She feels much better not we can all objects and feels like her energy has increased somewhat. At her home her weight has been 196.  She denies shortness of breath or chest pain and she does wear oxygen.  Repeat laboratory work was performed and it demonstrated probable dehydration or over diuresis. After being seen by Cecilie Kicks, one of our nurse practitioners, she recommended decreasing her diuretics which ever occurred. Unfortunately she re\re presented to the hospital in dehydration. Her medications were adjusted and she was hypotensive, therefore her blood pressure medications were stopped. Today she returns from that hospital followup and is on a lower dose of diuretics. Again her blood pressure medicines are stopped, and I suspect she  was on higher doses of blood pressure medicines due to the very high-volume and her pulmonary hypertension. She reports doing fairly well with a stable weight at home.   Ms. Doshier was recently admitted for acute GI bleeding. Her aspirin and and Plavix were discontinued. I actually do not see a clear indication for either medicine they suspected she had normal coronaries. She did undergo endoscopy by Dr. Cristina Gong, which did not show a clear source of her bleeding.  She appears to be clinically compensated with regard to her right heart failure.  I've reviewed daily weights [facility]. This demonstrates her weight has been very stable within 1-2 pounds over the past week. Of note she has started taking low-dose iron over-the-counter.  I had switched her to Niferex. She then followed up with Mickel Baas our nurse practitioner who felt she was doing well and her weight was stable at 201. She continues to get home health care and her weight remains stable without any worsening shortness of breath. There is no new GI bleeding.  Mrs. Essman was seen last week and had been having increased shortness of breath over the past week. She was up 17 pounds since her last dry weight. She reports her appetite next is decreased. Her home how services have now been discontinued. She reports decreased urine output and some difficulty with urination and recently saw primary care provider where a urinalysis was performed and it was normal. She's also had lower oxygen levels at home and  is markedly short of breath with exertion. I increased her torsemide 60 mg twice daily and added metolazone Q. Monday, Wednesday and Friday. We contacted her last Friday and she reported increased urination and that her weight was coming down. Over the weekend she did exceedingly well and is much improved with regards to her breathing. She brings back weights today that indicates she is now down 17  pounds about back to her last dry weight.  Allergies  Allergen Reactions  . Aspirin Other (See Comments)    GI bleed/ PUD  . Darvocet [Propoxyphene N-Acetaminophen] Nausea And Vomiting  . Diphenhydramine Hcl Other (See Comments)    Glaucoma prevents patient from taking this medication   . Aloe Vera Rash  . Penicillins Swelling and Rash    Current Outpatient Prescriptions  Medication Sig Dispense Refill  . ALPRAZolam (XANAX) 0.5 MG tablet Take 0.5 mg by mouth 2 (two) times daily. May take up to 4 times daily as prescribed      . bisacodyl (DULCOLAX) 10 MG suppository Place 10 mg rectally as needed for constipation.      . brimonidine (ALPHAGAN P) 0.1 % SOLN Place 1 drop into both eyes daily.      Marland Kitchen docusate sodium (STOOL SOFTENER) 100 MG capsule Take 100 mg by mouth 2 (two) times daily.      Marland Kitchen ezetimibe (ZETIA) 10 MG tablet Take 10 mg by mouth at bedtime.        . fexofenadine (ALLEGRA) 180 MG tablet Take 180 mg by mouth every morning.      Marland Kitchen glipiZIDE (GLUCOTROL) 10 MG tablet Take 10 mg by mouth 2 (two) times daily before a meal.        . iron polysaccharides (NIFEREX) 150 MG capsule Take 1 capsule (150 mg total) by mouth daily.  90 capsule  3  . lansoprazole (PREVACID) 30 MG capsule Take 1 capsule by mouth 2 (two) times daily.      Marland Kitchen levothyroxine (SYNTHROID, LEVOTHROID) 150 MCG tablet Take 150 mcg by mouth daily before breakfast.       . linagliptin (TRADJENTA) 5 MG TABS tablet Take 5 mg by mouth every morning.       . metolazone (ZAROXOLYN) 2.5 MG tablet Take 1 tablet by mouth on Mondays, Wednesdays, Friday. Take 30 minutes prior to AM dose of torsemide.  12 tablet  3  . oxyCODONE-acetaminophen (PERCOCET/ROXICET) 5-325 MG per tablet Take 1 tablet by mouth 2 (two) times daily. May take 1 additional dose if needed for pain      . OXYGEN-HELIUM IN Inhale 4 L into the lungs continuous.      . pantoprazole (PROTONIX) 40 MG tablet TAKE ONE TABLET TWICE DAILY  60 tablet  9  . polyethylene  glycol (MIRALAX / GLYCOLAX) packet Take 17 g by mouth as needed.       . potassium chloride SA (K-DUR,KLOR-CON) 20 MEQ tablet Take 2 tablets (40 mEq total) by mouth 2 (two) times daily.  120 tablet  2  . pravastatin (PRAVACHOL) 20 MG tablet Take 20 mg by mouth daily.      Marland Kitchen torsemide (DEMADEX) 20 MG tablet Take 3 tablets (60 mg total) by mouth 2 (two) times daily.  180 tablet  2  . Travoprost, BAK Free, (TRAVATAN) 0.004 % SOLN ophthalmic solution Place 1 drop into both eyes at bedtime.       No current facility-administered medications for this visit.    Past Medical History  Diagnosis Date  .  Diabetes mellitus   . Hypertension   . Shingles   . GERD (gastroesophageal reflux disease)   . Abnormal heart rhythms   . Thyroid disease     hypthyroidism  . Pulmonary hypertension, moderate to severe     No evidence of CAD on Cath 2011  . Obesity, morbid (more than 100 lbs over ideal weight or BMI > 40)   . Chronic respiratory failure with hypoxia 01/23/2012    Related to OHS  . Glaucoma   . CHF (congestive heart failure)     Echo at [facility] 10/14/2012 see notes  . Acute on chronic renal failure   . COPD (chronic obstructive pulmonary disease)     Past Surgical History  Procedure Laterality Date  . Tubal ligation    . Ovary removed    . Knee arthroscopy      left  . Cardiovascular stress test  09/15/2006    EF 81%; normal perfusion all regions; LV normal in size; no scintigraphic evidence of inducible myocardial ischemia  . Cardiac catheterization  02/21/2010    EF 65%; PA pressure 86/21 with mean of 44; normal coronary arteries, primary pumonary hypertension; no significant wall motion abnormalities  . Esophagogastroduodenoscopy N/A 06/25/2013    Procedure: ESOPHAGOGASTRODUODENOSCOPY (EGD);  Surgeon: Cleotis Nipper, MD;  Location: [facility] ENDOSCOPY;  Service: Endoscopy;  Laterality: N/A;  pediatric upper endoscope; no sedation    GYI:[id number]:VO colds or fevers, significant weight  changes Skin:no rashes or ulcers HEENT:no blurred vision, no congestion CV:see HPI PUL:see HPI GI:no diarrhea constipation or melena, no indigestion GU:no hematuria, no dysuria MS:no joint pain, no claudication Neuro:no syncope, no lightheadedness Endo:+ diabetes, + thyroid disease  PHYSICAL EXAM BP 130/77  Pulse 93  Ht 5\' 1"  (1.549 m)  Wt 201 lb 8 oz (91.4 kg)  BMI 38.09 kg/m2 GEN: Awake, on oxygen, notable dyspnea HEENT: PERRLA, EOMI, dry mucous membranes Neck: JVP is elevated ~3 cm above sternal notch Heart: Regular rate and rhythm, normal S1-S2, no murmurs rubs or gallops Lungs: Decreased breath sounds bilaterally, no rales rhonchi or wheezes Abdomen: Grossly obese, soft, nontender, abdominal distention Extremities: Trace to 1+ bilateral pedal edema Pulses: 1+ bilaterally Neurologic: Grossly nonfocal Skin: Improved skin color Psych: Mildly anxious  EKG:  deferred  ASSESSMENT AND PLAN Patient Active Problem List   Diagnosis Date Noted  . Acute on chronic diastolic heart failure 35/00/9381  . Pulmonary hypertension 09/08/2013  . CAP (community acquired pneumonia) 09/06/2013  . PUD (peptic ulcer disease)- ASA stopped 06/26/2013  . Acute blood loss anemia- transfused 10/14- negative endoscopy 06/25/2013  . Hypokalemia 04/05/2013  . Hyponatremia 04/05/2013  . Thrombocytopenia, unspecified 04/05/2013  . Volume depletion 04/04/2013  . Weakness 04/04/2013  . Acute renal failure 04/04/2013  . Hypotension 12/25/2012  . Diastolic CHF, chronic- EF 65-70% May 2014 12/25/2012  . Unspecified hypothyroidism 10/14/2012  . Normal coronary arteries at cath 2011 10/14/2012  . Obesity, morbid (more than 100 lbs over ideal weight or BMI > 40)   . Pulmonary hypertension, Severe by Echo 02/1713-pa pressure 74 mmHg   . 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 DM2 (diabetes mellitus, type 2) 10/13/2012  . Chronic respiratory  failure with hypoxia 01/23/2012   Plan: 1.  Mrs. Briere fortunately was able to diuresis with the addition of metolazone and increased torsemide. She is now back to about what her dry weight is. Her breathing has returned to normal and she was able to  seen in church on Sunday. I would recommend that she discontinue her metolazone now and only use it as needed. She was instructed to take it if she has more than 3 pound weight gain over 2-3 days, again 30 minutes prior to her first dose of torsemide in the morning. I will continue her on torsemide 60 mg twice daily. She should continue to monitor weights and contact our office with weight gain or worsening shortness of breath. I plan to see her back in 3 months for close followup.  Pixie Casino, MD, [facility] Attending Cardiologist [facility] [place]

## 2014-01-31 NOTE — Patient Instructions (Signed)
Change the metolazone to As needed, (if you gain 3-4 pounds in three days, you can take this in addition to the Torsemide)  Your physician recommends that you schedule a follow-up appointment in: [place] with Dr.Hilty

## 2014-02-14 ENCOUNTER — Other Ambulatory Visit (HOSPITAL_COMMUNITY): Payer: Self-pay | Admitting: Family Medicine

## 2014-02-14 DIAGNOSIS — Z780 Asymptomatic menopausal state: Secondary | ICD-10-CM

## 2014-02-21 ENCOUNTER — Ambulatory Visit (HOSPITAL_COMMUNITY)
Admission: RE | Admit: 2014-02-21 | Discharge: 2014-02-21 | Disposition: A | Discharge status: Home / Self Care | Payer: PRIVATE HEALTH INSURANCE | Source: Ambulatory Visit | Attending: Family Medicine | Admitting: Family Medicine

## 2014-02-21 DIAGNOSIS — Z780 Asymptomatic menopausal state: Secondary | ICD-10-CM | POA: Insufficient documentation

## 2014-03-02 IMAGING — CR DG CHEST 1V PORT
1 series · 1 of 1 positions shown · non-contrast
Comparison: 09/22/2012

CLINICAL DATA: Shortness of breath.

PORTABLE CHEST - 1 VIEW

[AP]
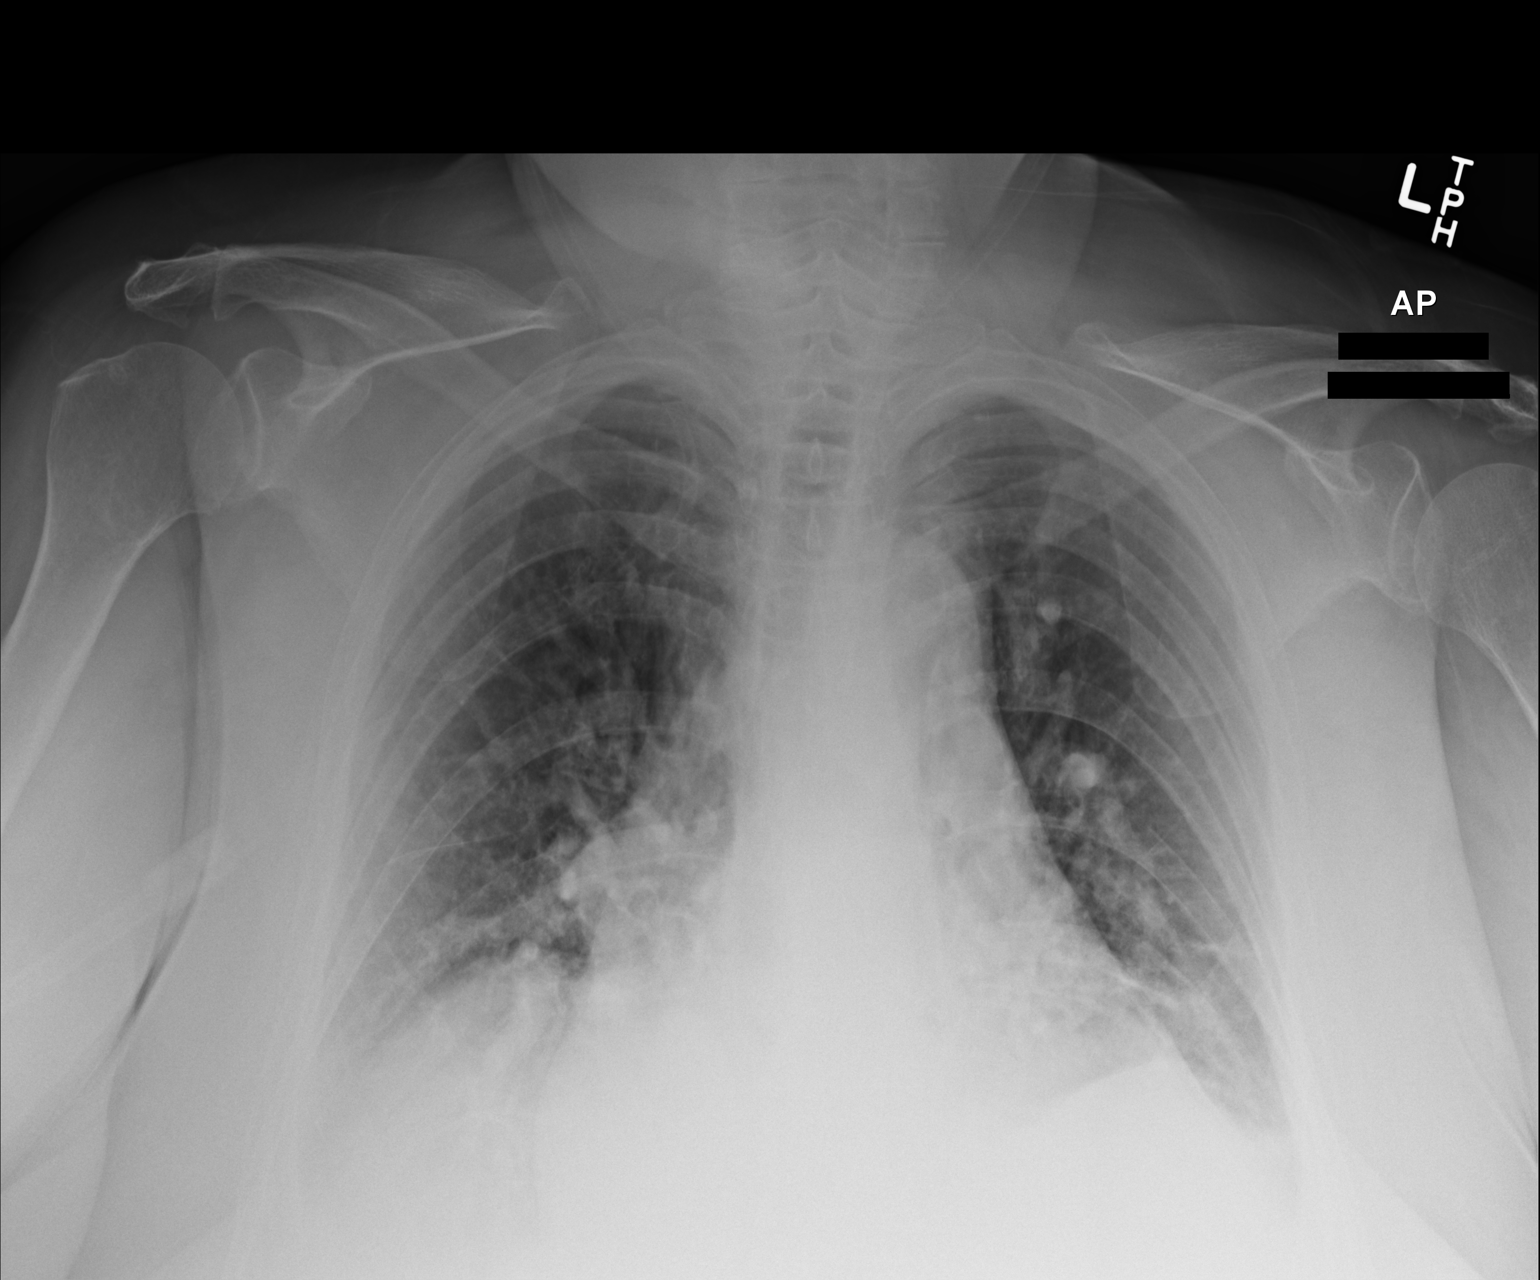

[1 of 1 positions shown; findings below may reference images not displayed]

FINDINGS: There is cardiomegaly with vascular congestion and
possible mild interstitial edema.  Bibasilar opacities and small
effusions.  No acute bony abnormality.
IMPRESSION: Suspect mild interstitial edema/CHF.

Small bilateral effusions with bibasilar opacities, most likely
atelectasis.

## 2014-03-03 ENCOUNTER — Ambulatory Visit (INDEPENDENT_AMBULATORY_CARE_PROVIDER_SITE_OTHER): Payer: PRIVATE HEALTH INSURANCE | Admitting: Otolaryngology

## 2014-03-03 DIAGNOSIS — H60509 Unspecified acute noninfective otitis externa, unspecified ear: Secondary | ICD-10-CM

## 2014-03-03 DIAGNOSIS — H903 Sensorineural hearing loss, bilateral: Secondary | ICD-10-CM

## 2014-04-26 ENCOUNTER — Other Ambulatory Visit: Payer: Self-pay | Admitting: Internal Medicine

## 2014-04-26 NOTE — Telephone Encounter (Signed)
Rx was sent to pharmacy electronically. 

## 2014-05-03 ENCOUNTER — Telehealth: Payer: Self-pay | Admitting: Internal Medicine

## 2014-05-03 ENCOUNTER — Ambulatory Visit (INDEPENDENT_AMBULATORY_CARE_PROVIDER_SITE_OTHER): Payer: PRIVATE HEALTH INSURANCE | Admitting: Internal Medicine

## 2014-05-03 ENCOUNTER — Encounter: Payer: Self-pay | Admitting: Internal Medicine

## 2014-05-03 VITALS — BP 102/60 | HR 118 | Ht 61.0 in | Wt 204.5 lb

## 2014-05-03 DIAGNOSIS — I5032 Chronic diastolic (congestive) heart failure: Secondary | ICD-10-CM

## 2014-05-03 DIAGNOSIS — IMO0001 Reserved for inherently not codable concepts without codable children: Secondary | ICD-10-CM

## 2014-05-03 DIAGNOSIS — I509 Heart failure, unspecified: Secondary | ICD-10-CM

## 2014-05-03 DIAGNOSIS — J9611 Chronic respiratory failure with hypoxia: Secondary | ICD-10-CM

## 2014-05-03 DIAGNOSIS — R0902 Hypoxemia: Secondary | ICD-10-CM

## 2014-05-03 DIAGNOSIS — I50810 Right heart failure, unspecified: Secondary | ICD-10-CM

## 2014-05-03 DIAGNOSIS — I2729 Other secondary pulmonary hypertension: Secondary | ICD-10-CM

## 2014-05-03 DIAGNOSIS — I2789 Other specified pulmonary heart diseases: Secondary | ICD-10-CM

## 2014-05-03 DIAGNOSIS — I479 Paroxysmal tachycardia, unspecified: Secondary | ICD-10-CM

## 2014-05-03 DIAGNOSIS — J9610 Chronic respiratory failure, unspecified whether with hypoxia or hypercapnia: Secondary | ICD-10-CM

## 2014-05-03 DIAGNOSIS — I272 Pulmonary hypertension, unspecified: Secondary | ICD-10-CM

## 2014-05-03 DIAGNOSIS — Z0389 Encounter for observation for other suspected diseases and conditions ruled out: Secondary | ICD-10-CM

## 2014-05-03 MED ORDER — CARVEDILOL 3.125 MG PO TABS: 3.1250 mg | ORAL_TABLET | Freq: Two times a day (BID) | ORAL | Status: AC

## 2014-05-03 NOTE — Telephone Encounter (Signed)
Received a call from [place] with [facility].She stated patient has been having palpitations and pre syncope since this past Sat 04/30/14.No chest pain. Stated she was prescribed doxycycline last week for a upper resp infection.Stated she only took 1 dose she thought that was causing palpitations.Stated she has not passed out.Appointment scheduled with Dr.Hilty this afternoon at 3:30 pm.

## 2014-05-03 NOTE — Patient Instructions (Signed)
STOP antibiotic.   Your physician has recommended you make the following change in your medication: START carvedilol 3.125mg  twice daily (beta block - to help control heart rate)  Please keep your appointment with Dr. Debara Pickett next week.

## 2014-05-03 NOTE — Progress Notes (Signed)
05/03/2014   PCP: Rocky Morel, MD   Chief Complaint  Patient presents with  . Palpitations    doxycyline 100mg  twice daily for 7 days was start for sinus issues (took 2 Sat/Sun and 1 Mon) >> palpitations - shortness of breath when walking (sinus related?) - headache about 2 weeks - reports lightheadedness/dizziness  . Pre-Syncope    yesterday 9/7 - all day yesterday - reports BP was "normal" 117/74?    Primary Cardiologist: Dr. Debara Pickett  HPI:  79 y/o, morbidly obese female with a history of right heart failure, pulmonary hypertension and obstructive sleep apnea, intolerant to CPAP. She has had multiple admissions this year for acute on chronic diastolic HF. Her last echo was on 01/12/2013, which demonstrated normal LV function, with an EF of 65-70% and grade I diastolic dysfunction. She presented back to [facility] July 15 with a complaint of progressive resting dyspnea and bilateral lower extremity edema. She noted associated weight gain, orthopnea and PND. She denied any chest pain. She was diuresed, and improved. Dry weight was thought to be 215 pounds.   She was diuresed 11 liters in the hospital. At discharge he was to be on Bumex 3 mg daily and Zaroxolyn 5 mg every morning the Cozaar was held secondary to hypotension. Also she continued with home oxygen.  Unfortunately Bumex was not being made so she was placed on Demadex 60 mg twice a day.Since discharge she has done quite well, her weight dropped quite dramatically to 199 pounds.  She feels much better not we can all objects and feels like her energy has increased somewhat. At her home her weight has been 196.  She denies shortness of breath or chest pain and she does wear oxygen.  Repeat laboratory work was performed and it demonstrated probable dehydration or over diuresis. After being seen by Cecilie Kicks, one of our nurse practitioners, she recommended decreasing her diuretics which ever occurred. Unfortunately she re\re presented to  the hospital in dehydration. Her medications were adjusted and she was hypotensive, therefore her blood pressure medications were stopped. Today she returns from that hospital followup and is on a lower dose of diuretics. Again her blood pressure medicines are stopped, and I suspect she was on higher doses of blood pressure medicines due to the very high-volume and her pulmonary hypertension. She reports doing fairly well with a stable weight at home.   Ms. Vest was recently admitted for acute GI bleeding. Her aspirin and and Plavix were discontinued. I actually do not see a clear indication for either medicine they suspected she had normal coronaries. She did undergo endoscopy by Dr. Cristina Gong, which did not show a clear source of her bleeding.  She appears to be clinically compensated with regard to her right heart failure.  I've reviewed daily weights [facility]. This demonstrates her weight has been very stable within 1-2 pounds over the past week. Of note she has started taking low-dose iron over-the-counter.  I had switched her to Niferex. She then followed up with Mickel Baas our nurse practitioner who felt she was doing well and her weight was stable at 201. She continues to get home health care and her weight remains stable without any worsening shortness of breath. There is no new GI bleeding.  Mrs. Greb presents today with a chief complaint of palpitations. She is noted that her heart is racing for the past couple of days since she started on  doxycycline which is prescribed for presumed sinus infection. She took 2 doses of medicine and has since discontinued it. She is noted to be tachycardic today with a heart rate in the 118 range. She feels mildly short of breath. Her weight is fairly stable and not increased significantly since her last office visit. There is no sign of heart failure decompensation.  Allergies  Allergen Reactions  . Aspirin  Other (See Comments)    GI bleed/ PUD  . Darvocet [Propoxyphene N-Acetaminophen] Nausea And Vomiting  . Diphenhydramine Hcl Other (See Comments)    Glaucoma prevents patient from taking this medication   . Aloe Vera Rash  . Penicillins Swelling and Rash    Current Outpatient Prescriptions  Medication Sig Dispense Refill  . ALPRAZolam (XANAX) 0.5 MG tablet Take 0.5 mg by mouth 2 (two) times daily. May take up to 4 times daily as prescribed      . bisacodyl (DULCOLAX) 10 MG suppository Place 10 mg rectally as needed for constipation.      . brimonidine (ALPHAGAN P) 0.1 % SOLN Place 1 drop into both eyes daily.      Marland Kitchen docusate sodium (STOOL SOFTENER) 100 MG capsule Take 100 mg by mouth 2 (two) times daily.      Marland Kitchen ezetimibe (ZETIA) 10 MG tablet Take 10 mg by mouth at bedtime.        . fexofenadine (ALLEGRA) 180 MG tablet Take 180 mg by mouth every morning.      Marland Kitchen glipiZIDE (GLUCOTROL) 10 MG tablet Take 10 mg by mouth 2 (two) times daily before a meal.        . iron polysaccharides (NIFEREX) 150 MG capsule Take 1 capsule (150 mg total) by mouth daily.  90 capsule  3  . lansoprazole (PREVACID) 30 MG capsule Take 1 capsule by mouth 2 (two) times daily.      Marland Kitchen levothyroxine (SYNTHROID, LEVOTHROID) 150 MCG tablet Take 150 mcg by mouth daily before breakfast.       . linagliptin (TRADJENTA) 5 MG TABS tablet Take 5 mg by mouth every morning.       . metolazone (ZAROXOLYN) 2.5 MG tablet Take 1 tablet by mouth on Mondays, Wednesdays, Friday. Take 30 minutes prior to AM dose of torsemide.  12 tablet  3  . oxyCODONE-acetaminophen (PERCOCET/ROXICET) 5-325 MG per tablet Take 1 tablet by mouth 2 (two) times daily. May take 1 additional dose if needed for pain      . OXYGEN-HELIUM IN Inhale 4 L into the lungs continuous.      . pantoprazole (PROTONIX) 40 MG tablet TAKE ONE TABLET TWICE DAILY  60 tablet  9  . polyethylene glycol (MIRALAX / GLYCOLAX) packet Take 17 g by mouth as needed.       . potassium  chloride SA (K-DUR,KLOR-CON) 20 MEQ tablet Take 2 tablets (40 mEq total) by mouth 2 (two) times daily.  120 tablet  2  . pravastatin (PRAVACHOL) 20 MG tablet Take 20 mg by mouth daily.      Marland Kitchen torsemide (DEMADEX) 20 MG tablet TAKE THREE TABLETS BY MOUTH TWICE A DAY.  180 tablet  6  . Travoprost, BAK Free, (TRAVATAN) 0.004 % SOLN ophthalmic solution Place 1 drop into both eyes at bedtime.      . carvedilol (COREG) 3.125 MG tablet Take 1 tablet (3.125 mg total) by mouth 2 (two) times daily with a meal.  60 tablet  6   No current facility-administered medications for this visit.  Past Medical History  Diagnosis Date  . Diabetes mellitus   . Hypertension   . Shingles   . GERD (gastroesophageal reflux disease)   . Abnormal heart rhythms   . Thyroid disease     hypthyroidism  . Pulmonary hypertension, moderate to severe     No evidence of CAD on Cath 2011  . Obesity, morbid (more than 100 lbs over ideal weight or BMI > 40)   . Chronic respiratory failure with hypoxia 01/23/2012    Related to OHS  . Glaucoma   . CHF (congestive heart failure)     Echo at [facility] 10/14/2012 see notes  . Acute on chronic renal failure   . COPD (chronic obstructive pulmonary disease)     Past Surgical History  Procedure Laterality Date  . Tubal ligation    . Ovary removed    . Knee arthroscopy      left  . Cardiovascular stress test  09/15/2006    EF 81%; normal perfusion all regions; LV normal in size; no scintigraphic evidence of inducible myocardial ischemia  . Cardiac catheterization  02/21/2010    EF 65%; PA pressure 86/21 with mean of 44; normal coronary arteries, primary pumonary hypertension; no significant wall motion abnormalities  . Esophagogastroduodenoscopy N/A 06/25/2013    Procedure: ESOPHAGOGASTRODUODENOSCOPY (EGD);  Surgeon: Cleotis Nipper, MD;  Location: [facility] ENDOSCOPY;  Service: Endoscopy;  Laterality: N/A;  pediatric upper endoscope; no sedation    RDE:[id number]:EH colds or fevers,  significant weight changes Skin:no rashes or ulcers HEENT:no blurred vision, no congestion CV:see HPI PUL:see HPI GI:no diarrhea constipation or melena, no indigestion GU:no hematuria, no dysuria MS:no joint pain, no claudication Neuro:no syncope, no lightheadedness Endo:+ diabetes, + thyroid disease  PHYSICAL EXAM BP 102/60  Pulse 118  Ht 5\' 1"  (1.549 m)  Wt 204 lb 8 oz (92.761 kg)  BMI 38.66 kg/m2 GEN: Awake, on oxygen, notable dyspnea HEENT: PERRLA, EOMI, dry mucous membranes Neck: JVP is elevated ~3 cm above sternal notch Heart: Regular tachycardia, normal S1-S2, no murmurs rubs or gallops Lungs: Decreased breath sounds bilaterally, no rales rhonchi or wheezes Abdomen: Grossly obese, soft, nontender, abdominal distention Extremities: Trace to 1+ bilateral pedal edema Pulses: 1+ bilaterally Neurologic: Grossly nonfocal Skin: Improved skin color Psych: Mildly anxious  EKG:  Sinus tachycardia with first degree AV block and PR interval 228 ms, incomplete right bundle-branch block and RVH (and ectopic atrial tachycardia cannot be excluded)  ASSESSMENT AND PLAN Patient Active Problem List   Diagnosis Date Noted  . Acute on chronic diastolic heart failure 63/14/9702  . Pulmonary hypertension 09/08/2013  . CAP (community acquired pneumonia) 09/06/2013  . PUD (peptic ulcer disease)- ASA stopped 06/26/2013  . Acute blood loss anemia- transfused 10/14- negative endoscopy 06/25/2013  . Hypokalemia 04/05/2013  . Hyponatremia 04/05/2013  . Thrombocytopenia, unspecified 04/05/2013  . Volume depletion 04/04/2013  . Weakness 04/04/2013  . Acute renal failure 04/04/2013  . Hypotension 12/25/2012  . Diastolic CHF, chronic- EF 65-70% May 2014 12/25/2012  . Unspecified hypothyroidism 10/14/2012  . Normal coronary arteries at cath 2011 10/14/2012  . Obesity, morbid (more than 100 lbs over ideal weight or BMI > 40)   . Pulmonary hypertension, Severe by Echo 02/1713-pa pressure 74 mmHg    . 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 DM2 (diabetes mellitus, type 2) 10/13/2012  . Chronic respiratory failure with hypoxia 01/23/2012   Plan: 1.  Mrs. Santistevan is tachycardic today at rest  which persisted during her office visit. This appears to be an inappropriate tachycardia however could also be an ectopic atrial tachycardia. There are subtle P waves that are seen on the T waves and a long PR interval. She does have a history of arrhythmia in the past. Would be unusual for her to be in sinus tachycardia at rest. She denies any chest pain and has no signs of decompensation with regards to her heart failure. She was previously on beta blocker but this is removed due to hypotension. I do believe that she would tolerate low-dose beta blocker I would recommend starting coreg 3.125 twice a day. If there is an issue with hypotension or further heart rate control as necessary, she may be a candidate for the addition of Ivabridine. Said she feels her tachycardia is related to doxycycline, an antibiotic but she's never taken before, I recommended discontinuing that. She's not having any fever or purulent sinus discharge therefore I do not feel that this is likely to be purulent sinusitis. She should continue with the nasal steroid and sinus washes.  Plan to see her back next week for regularly scheduled appointment.  Pixie Casino, MD, [facility] Attending Cardiologist [facility]

## 2014-05-10 ENCOUNTER — Ambulatory Visit (INDEPENDENT_AMBULATORY_CARE_PROVIDER_SITE_OTHER): Payer: PRIVATE HEALTH INSURANCE | Admitting: Internal Medicine

## 2014-05-10 ENCOUNTER — Encounter: Payer: Self-pay | Admitting: Internal Medicine

## 2014-05-10 VITALS — BP 114/68 | HR 90 | Ht 61.0 in | Wt 210.5 lb

## 2014-05-10 DIAGNOSIS — J310 Chronic rhinitis: Secondary | ICD-10-CM

## 2014-05-10 DIAGNOSIS — I2789 Other specified pulmonary heart diseases: Secondary | ICD-10-CM

## 2014-05-10 DIAGNOSIS — R000 Tachycardia, unspecified: Secondary | ICD-10-CM | POA: Insufficient documentation

## 2014-05-10 DIAGNOSIS — I272 Pulmonary hypertension, unspecified: Secondary | ICD-10-CM

## 2014-05-10 DIAGNOSIS — T485X5A Adverse effect of other anti-common-cold drugs, initial encounter: Secondary | ICD-10-CM

## 2014-05-10 NOTE — Patient Instructions (Signed)
STOP using Afrin and use Nasacort / Nasonex (both OTC) instead at night and a saline solution spray in the morning  Dr.Hilty wants you to follow-up in:6 months You will receive a reminder letter in the mail two months in advance. If you don't receive a letter, please call our office to schedule the follow-up appointment.

## 2014-05-10 NOTE — Progress Notes (Signed)
05/10/2014   PCP: Rocky Morel, MD   Chief Complaint  Patient presents with  . Follow-up    1 week visit - "feels heart thumping" - complains of weakness - reports shortness of breath (related to sinus congestion) - reports some lightheadedness/dizziness (position changes)     Primary Cardiologist: Dr. Debara Pickett  HPI:  79 y/o, morbidly obese female with a history of right heart failure, pulmonary hypertension and obstructive sleep apnea, intolerant to CPAP. She has had multiple admissions this year for acute on chronic diastolic HF. Her last echo was on 01/12/2013, which demonstrated normal LV function, with an EF of 65-70% and grade I diastolic dysfunction. She presented back to [facility] July 15 with a complaint of progressive resting dyspnea and bilateral lower extremity edema. She noted associated weight gain, orthopnea and PND. She denied any chest pain. She was diuresed, and improved. Dry weight was thought to be 215 pounds.   She was diuresed 11 liters in the hospital. At discharge he was to be on Bumex 3 mg daily and Zaroxolyn 5 mg every morning the Cozaar was held secondary to hypotension. Also she continued with home oxygen.  Unfortunately Bumex was not being made so she was placed on Demadex 60 mg twice a day.Since discharge she has done quite well, her weight dropped quite dramatically to 199 pounds.  She feels much better not we can all objects and feels like her energy has increased somewhat. At her home her weight has been 196.  She denies shortness of breath or chest pain and she does wear oxygen.  Repeat laboratory work was performed and it demonstrated probable dehydration or over diuresis. After being seen by Cecilie Kicks, one of our nurse practitioners, she recommended decreasing her diuretics which ever occurred. Unfortunately she re\re presented to the hospital in dehydration. Her medications were adjusted and she was hypotensive, therefore her blood pressure medications were  stopped. Today she returns from that hospital followup and is on a lower dose of diuretics. Again her blood pressure medicines are stopped, and I suspect she was on higher doses of blood pressure medicines due to the very high-volume and her pulmonary hypertension. She reports doing fairly well with a stable weight at home.   Ms. Gallogly was recently admitted for acute GI bleeding. Her aspirin and and Plavix were discontinued. I actually do not see a clear indication for either medicine they suspected she had normal coronaries. She did undergo endoscopy by Dr. Cristina Gong, which did not show a clear source of her bleeding.  She appears to be clinically compensated with regard to her right heart failure.  I've reviewed daily weights [facility]. This demonstrates her weight has been very stable within 1-2 pounds over the past week. Of note she has started taking low-dose iron over-the-counter.  I had switched her to Niferex. She then followed up with Mickel Baas our nurse practitioner who felt she was doing well and her weight was stable at 201. She continues to get home health care and her weight remains stable without any worsening shortness of breath. There is no new GI bleeding.  Mrs. Bruhn returns today for followup of her palpitations. She was started on low-dose beta blocker and has had mild improvement in her tachycardia. She continues to have problems with nasal congestion. She's been using Afrin on a nightly basis. This is been going on at least for several months. I suspect she has rhinitis  medicamentosa. Just a reports of occasional nosebleed which likely represents friable tissue. She does wear chronic oxygen nasal cannula. She reports her oxygen levels have been low in the low 90s and did come up somewhat if she puts a nasal cannula in her mouth.  Allergies  Allergen Reactions  . Aspirin Other (See Comments)    GI bleed/ PUD  . Darvocet  [Propoxyphene N-Acetaminophen] Nausea And Vomiting  . Diphenhydramine Hcl Other (See Comments)    Glaucoma prevents patient from taking this medication   . Aloe Vera Rash  . Penicillins Swelling and Rash    Current Outpatient Prescriptions  Medication Sig Dispense Refill  . ALPRAZolam (XANAX) 0.5 MG tablet Take 0.5 mg by mouth 2 (two) times daily. May take up to 4 times daily as prescribed      . bisacodyl (DULCOLAX) 10 MG suppository Place 10 mg rectally as needed for constipation.      . brimonidine (ALPHAGAN P) 0.1 % SOLN Place 1 drop into both eyes daily.      . carvedilol (COREG) 3.125 MG tablet Take 1 tablet (3.125 mg total) by mouth 2 (two) times daily with a meal.  60 tablet  6  . docusate sodium (STOOL SOFTENER) 100 MG capsule Take 100 mg by mouth 2 (two) times daily.      Marland Kitchen ezetimibe (ZETIA) 10 MG tablet Take 10 mg by mouth at bedtime.        . fexofenadine (ALLEGRA) 180 MG tablet Take 180 mg by mouth every morning.      Marland Kitchen glipiZIDE (GLUCOTROL) 10 MG tablet Take 10 mg by mouth 2 (two) times daily before a meal.        . iron polysaccharides (NIFEREX) 150 MG capsule Take 1 capsule (150 mg total) by mouth daily.  90 capsule  3  . levothyroxine (SYNTHROID, LEVOTHROID) 150 MCG tablet Take 150 mcg by mouth daily before breakfast.       . linagliptin (TRADJENTA) 5 MG TABS tablet Take 5 mg by mouth every morning.       . metolazone (ZAROXOLYN) 2.5 MG tablet Take 1 tablet by mouth on Mondays, Wednesdays, Friday. Take 30 minutes prior to AM dose of torsemide.  12 tablet  3  . oxyCODONE-acetaminophen (PERCOCET/ROXICET) 5-325 MG per tablet Take 1 tablet by mouth 2 (two) times daily. May take 1 additional dose if needed for pain      . OXYGEN-HELIUM IN Inhale 4 L into the lungs continuous.      . pantoprazole (PROTONIX) 40 MG tablet TAKE ONE TABLET TWICE DAILY  60 tablet  9  . polyethylene glycol (MIRALAX / GLYCOLAX) packet Take 17 g by mouth as needed.       . potassium chloride SA  (K-DUR,KLOR-CON) 20 MEQ tablet Take 2 tablets (40 mEq total) by mouth 2 (two) times daily.  120 tablet  2  . pravastatin (PRAVACHOL) 20 MG tablet Take 20 mg by mouth daily.      Marland Kitchen torsemide (DEMADEX) 20 MG tablet TAKE THREE TABLETS BY MOUTH TWICE A DAY.  180 tablet  6  . Travoprost, BAK Free, (TRAVATAN) 0.004 % SOLN ophthalmic solution Place 1 drop into both eyes at bedtime.       No current facility-administered medications for this visit.    Past Medical History  Diagnosis Date  . Diabetes mellitus   . Hypertension   . Shingles   . GERD (gastroesophageal reflux disease)   . Abnormal heart rhythms   . Thyroid disease  hypthyroidism  . Pulmonary hypertension, moderate to severe     No evidence of CAD on Cath 2011  . Obesity, morbid (more than 100 lbs over ideal weight or BMI > 40)   . Chronic respiratory failure with hypoxia 01/23/2012    Related to OHS  . Glaucoma   . CHF (congestive heart failure)     Echo at [facility] 10/14/2012 see notes  . Acute on chronic renal failure   . COPD (chronic obstructive pulmonary disease)     Past Surgical History  Procedure Laterality Date  . Tubal ligation    . Ovary removed    . Knee arthroscopy      left  . Cardiovascular stress test  09/15/2006    EF 81%; normal perfusion all regions; LV normal in size; no scintigraphic evidence of inducible myocardial ischemia  . Cardiac catheterization  02/21/2010    EF 65%; PA pressure 86/21 with mean of 44; normal coronary arteries, primary pumonary hypertension; no significant wall motion abnormalities  . Esophagogastroduodenoscopy N/A 06/25/2013    Procedure: ESOPHAGOGASTRODUODENOSCOPY (EGD);  Surgeon: Cleotis Nipper, MD;  Location: [facility] ENDOSCOPY;  Service: Endoscopy;  Laterality: N/A;  pediatric upper endoscope; no sedation    OXB:[id number]:EQ colds or fevers, significant weight changes Skin:no rashes or ulcers HEENT:no blurred vision, no congestion CV:see HPI PUL:see HPI GI:no diarrhea  constipation or melena, no indigestion GU:no hematuria, no dysuria MS:no joint pain, no claudication Neuro:no syncope, no lightheadedness Endo:+ diabetes, + thyroid disease  PHYSICAL EXAM BP 114/68  Pulse 90  Ht 5\' 1"  (1.549 m)  Wt 210 lb 8 oz (95.482 kg)  BMI 39.79 kg/m2 GEN: Awake, on oxygen, notable dyspnea HEENT: PERRLA, EOMI, dry mucous membranes Neck: JVP is elevated ~3 cm above sternal notch Heart: Regular tachycardia, normal S1-S2, no murmurs rubs or gallops Lungs: Decreased breath sounds bilaterally, no rales rhonchi or wheezes Abdomen: Grossly obese, soft, nontender, abdominal distention Extremities: Trace to 1+ bilateral pedal edema Pulses: 1+ bilaterally Neurologic: Grossly nonfocal Skin: Improved skin color Psych: Mildly anxious  EKG:  Deferred  ASSESSMENT AND PLAN Patient Active Problem List   Diagnosis Date Noted  . Tachycardia 05/10/2014  . Rhinitis medicamentosa 05/10/2014  . Acute on chronic diastolic heart failure 68/34/1962  . Pulmonary hypertension 09/08/2013  . CAP (community acquired pneumonia) 09/06/2013  . PUD (peptic ulcer disease)- ASA stopped 06/26/2013  . Acute blood loss anemia- transfused 10/14- negative endoscopy 06/25/2013  . Hypokalemia 04/05/2013  . Hyponatremia 04/05/2013  . Thrombocytopenia, unspecified 04/05/2013  . Volume depletion 04/04/2013  . Weakness 04/04/2013  . Acute renal failure 04/04/2013  . Hypotension 12/25/2012  . Diastolic CHF, chronic- EF 65-70% May 2014 12/25/2012  . Unspecified hypothyroidism 10/14/2012  . Normal coronary arteries at cath 2011 10/14/2012  . Obesity, morbid (more than 100 lbs over ideal weight or BMI > 40)   . Pulmonary hypertension, Severe by Echo 02/1713-pa pressure 74 mmHg   . 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 DM2 (diabetes mellitus, type 2) 10/13/2012  . Chronic respiratory failure with hypoxia 01/23/2012   Plan: 1.  Mrs.  Stoltzfus has had a mild improvement in her tachycardia with low-dose beta blocker. I think part of the problem is that she's not getting the oxygen she should through her nasal cannula due to nasal congestion. I suspect she has rhinitis medicamentosa a sinus tachycardia she is now using Afrin on a nightly basis and has rebound congestion. I've advised her  to start taking nasal saline sprays in the morning and alternating with a nasal steroid OTC in the evening. She was advised to discontinue Afrin. She is scheduled to followup with her primary care provider later this week as she had side effects from her antibiotic and may or may not need another antibiotic. It could be that her sinus congestion symptoms are purely related to ongoing Afrin use.  Pixie Casino, MD, [facility] Attending Cardiologist [facility]

## 2014-06-10 ENCOUNTER — Telehealth: Payer: Self-pay | Admitting: Cardiology

## 2014-06-10 NOTE — Telephone Encounter (Signed)
Delrae Rend is calling because Ms.[place] called yesterday complaining of shortness of breath  And O2 stats are 94 % heart rate 88 , weight 202 pounds about 5 pd increase .Marland Kitchen Took an extra Metolazone on yesterday and today is still short of breath and is urinating a  Lot and weight is the same and she is comfortable at rest and Alisa will monitor her closely and will call her at home tomorrow,and will fax a note outlying everything and will follow up with Mrs. Markwardt on Monday .   Thanks

## 2014-06-10 NOTE — Telephone Encounter (Signed)
Returned call to [place] with [facility] no answer.Spoke to patient she stated she is feeling better today.Stated she is sob but is much better.Message says Elmo Putt will be closely monitoring you and she will follow up with you on Mon 06/13/14.Message sent to [facility] for review.

## 2014-06-10 NOTE — Telephone Encounter (Signed)
Reviewed - lets see how they do - will look for more info on Monday  Leonie Man, MD

## 2014-06-14 ENCOUNTER — Telehealth: Payer: Self-pay | Admitting: Internal Medicine

## 2014-06-14 DIAGNOSIS — Z79899 Other long term (current) drug therapy: Secondary | ICD-10-CM

## 2014-06-14 DIAGNOSIS — R0602 Shortness of breath: Secondary | ICD-10-CM

## 2014-06-14 NOTE — Telephone Encounter (Signed)
Delrae Rend was calling in regards to the pt having volume overload and SOB. Please call   Thanks

## 2014-06-14 NOTE — Telephone Encounter (Signed)
Per Delrae Rend, patient has report a slight improvement in her dyspnea and that her weight is down about 2 lbs over the weekend (200lbs) however her "comfortable weight" is 195-197lbs. Alisa reports last week that patient could not walk 5 feet without being winded. Patient had to take a few extra metolazone over the weekend (was prescribed to take MWF). On Friday O2 sats on 5L continuous O2 were 93-94% at rest and dropped to 85-86% with exertion. Alisa reports that patient recovers well from this desaturation. Patient also reports her heart racing some  Spoke with [place], [place] who advised that patient take metolazone 2.5mg  30 minutes prior to AM dose of torsemide and have labs (BMP, BNP) and CXR done at [facility] hospital.  This information was communicated to [facility].   Delrae Rend will call patient every day - patient records daily weights at home in a notebook. She will physically go see patient on Friday.   Spoke with patient and she is agreeable to have labs and CXR done at [facility] tomorrow or Thursday.

## 2014-06-15 ENCOUNTER — Telehealth: Payer: Self-pay | Admitting: Physician Assistant

## 2014-06-15 ENCOUNTER — Ambulatory Visit (HOSPITAL_COMMUNITY)
Admission: RE | Admit: 2014-06-15 | Discharge: 2014-06-15 | Disposition: A | Discharge status: Home / Self Care | Payer: PRIVATE HEALTH INSURANCE | Source: Ambulatory Visit | Attending: Physician Assistant | Admitting: Physician Assistant

## 2014-06-15 ENCOUNTER — Other Ambulatory Visit: Payer: Self-pay | Admitting: *Deleted

## 2014-06-15 ENCOUNTER — Telehealth: Payer: Self-pay | Admitting: Internal Medicine

## 2014-06-15 DIAGNOSIS — I509 Heart failure, unspecified: Secondary | ICD-10-CM | POA: Diagnosis not present

## 2014-06-15 DIAGNOSIS — E119 Type 2 diabetes mellitus without complications: Secondary | ICD-10-CM | POA: Insufficient documentation

## 2014-06-15 DIAGNOSIS — J449 Chronic obstructive pulmonary disease, unspecified: Secondary | ICD-10-CM | POA: Insufficient documentation

## 2014-06-15 DIAGNOSIS — I10 Essential (primary) hypertension: Secondary | ICD-10-CM | POA: Insufficient documentation

## 2014-06-15 DIAGNOSIS — R0602 Shortness of breath: Secondary | ICD-10-CM | POA: Insufficient documentation

## 2014-06-15 NOTE — Telephone Encounter (Signed)
Routed to Dr. Debara Pickett to review as Juluis Rainier

## 2014-06-15 NOTE — Telephone Encounter (Signed)
Needs a chest x-ray order place in system

## 2014-06-15 NOTE — Telephone Encounter (Signed)
Spoke to [name]

## 2014-06-15 NOTE — Telephone Encounter (Signed)
Faxed printed orders to [phone] for BMET & BNP @ [facility]

## 2014-06-15 NOTE — Telephone Encounter (Signed)
Needs orders put in

## 2014-06-16 LAB — BASIC METABOLIC PANEL
BUN: 32 mg/dL — ABNORMAL HIGH (ref 6–23)
CO2: 27 mEq/L (ref 19–32)
CREATININE: 1.34 mg/dL — AB (ref 0.50–1.10)
Calcium: 9 mg/dL (ref 8.4–10.5)
Chloride: 98 mEq/L (ref 96–112)
Glucose, Bld: 141 mg/dL — ABNORMAL HIGH (ref 70–99)
Potassium: 3.5 mEq/L (ref 3.5–5.3)
SODIUM: 137 meq/L (ref 135–145)

## 2014-06-16 LAB — BRAIN NATRIURETIC PEPTIDE: BRAIN NATRIURETIC PEPTIDE: 902.5 pg/mL — AB (ref 0.0–100.0)

## 2014-06-22 ENCOUNTER — Inpatient Hospital Stay (HOSPITAL_COMMUNITY)
Admission: EM | Admit: 2014-06-22 | Discharge: 2014-06-25 | DRG: 291 | Disposition: A | Discharge status: Home / Self Care | Payer: PRIVATE HEALTH INSURANCE | Attending: Family Medicine | Admitting: Family Medicine

## 2014-06-22 ENCOUNTER — Encounter (HOSPITAL_COMMUNITY): Payer: Self-pay | Admitting: Emergency Medicine

## 2014-06-22 ENCOUNTER — Emergency Department (HOSPITAL_COMMUNITY): Payer: PRIVATE HEALTH INSURANCE

## 2014-06-22 DIAGNOSIS — I5033 Acute on chronic diastolic (congestive) heart failure: Principal | ICD-10-CM | POA: Diagnosis present

## 2014-06-22 DIAGNOSIS — K219 Gastro-esophageal reflux disease without esophagitis: Secondary | ICD-10-CM | POA: Diagnosis present

## 2014-06-22 DIAGNOSIS — Z79891 Long term (current) use of opiate analgesic: Secondary | ICD-10-CM

## 2014-06-22 DIAGNOSIS — I50813 Acute on chronic right heart failure: Secondary | ICD-10-CM

## 2014-06-22 DIAGNOSIS — Z9981 Dependence on supplemental oxygen: Secondary | ICD-10-CM | POA: Diagnosis not present

## 2014-06-22 DIAGNOSIS — E119 Type 2 diabetes mellitus without complications: Secondary | ICD-10-CM | POA: Diagnosis present

## 2014-06-22 DIAGNOSIS — Z79899 Other long term (current) drug therapy: Secondary | ICD-10-CM

## 2014-06-22 DIAGNOSIS — J9601 Acute respiratory failure with hypoxia: Secondary | ICD-10-CM | POA: Diagnosis present

## 2014-06-22 DIAGNOSIS — J9611 Chronic respiratory failure with hypoxia: Secondary | ICD-10-CM

## 2014-06-22 DIAGNOSIS — Z823 Family history of stroke: Secondary | ICD-10-CM | POA: Diagnosis not present

## 2014-06-22 DIAGNOSIS — E873 Alkalosis: Secondary | ICD-10-CM | POA: Diagnosis present

## 2014-06-22 DIAGNOSIS — I5023 Acute on chronic systolic (congestive) heart failure: Secondary | ICD-10-CM

## 2014-06-22 DIAGNOSIS — Z807 Family history of other malignant neoplasms of lymphoid, hematopoietic and related tissues: Secondary | ICD-10-CM | POA: Diagnosis not present

## 2014-06-22 DIAGNOSIS — I272 Other secondary pulmonary hypertension: Secondary | ICD-10-CM | POA: Diagnosis present

## 2014-06-22 DIAGNOSIS — F17290 Nicotine dependence, other tobacco product, uncomplicated: Secondary | ICD-10-CM | POA: Diagnosis present

## 2014-06-22 DIAGNOSIS — R531 Weakness: Secondary | ICD-10-CM

## 2014-06-22 DIAGNOSIS — E039 Hypothyroidism, unspecified: Secondary | ICD-10-CM | POA: Diagnosis present

## 2014-06-22 DIAGNOSIS — N189 Chronic kidney disease, unspecified: Secondary | ICD-10-CM | POA: Diagnosis present

## 2014-06-22 DIAGNOSIS — J441 Chronic obstructive pulmonary disease with (acute) exacerbation: Secondary | ICD-10-CM | POA: Diagnosis present

## 2014-06-22 DIAGNOSIS — J9621 Acute and chronic respiratory failure with hypoxia: Secondary | ICD-10-CM | POA: Diagnosis present

## 2014-06-22 DIAGNOSIS — Z833 Family history of diabetes mellitus: Secondary | ICD-10-CM | POA: Diagnosis not present

## 2014-06-22 DIAGNOSIS — I129 Hypertensive chronic kidney disease with stage 1 through stage 4 chronic kidney disease, or unspecified chronic kidney disease: Secondary | ICD-10-CM | POA: Diagnosis present

## 2014-06-22 DIAGNOSIS — I2781 Cor pulmonale (chronic): Secondary | ICD-10-CM | POA: Diagnosis present

## 2014-06-22 DIAGNOSIS — N179 Acute kidney failure, unspecified: Secondary | ICD-10-CM | POA: Diagnosis present

## 2014-06-22 DIAGNOSIS — G4733 Obstructive sleep apnea (adult) (pediatric): Secondary | ICD-10-CM | POA: Diagnosis present

## 2014-06-22 DIAGNOSIS — Z8249 Family history of ischemic heart disease and other diseases of the circulatory system: Secondary | ICD-10-CM | POA: Diagnosis not present

## 2014-06-22 DIAGNOSIS — E785 Hyperlipidemia, unspecified: Secondary | ICD-10-CM | POA: Diagnosis present

## 2014-06-22 DIAGNOSIS — H409 Unspecified glaucoma: Secondary | ICD-10-CM | POA: Diagnosis present

## 2014-06-22 LAB — CBC WITH DIFFERENTIAL/PLATELET
BASOS PCT: 1 % (ref 0–1)
Basophils Absolute: 0.1 10*3/uL (ref 0.0–0.1)
Eosinophils Absolute: 0.2 10*3/uL (ref 0.0–0.7)
Eosinophils Relative: 2 % (ref 0–5)
HEMATOCRIT: 35 % — AB (ref 36.0–46.0)
HEMOGLOBIN: 10.5 g/dL — AB (ref 12.0–15.0)
LYMPHS PCT: 12 % (ref 12–46)
Lymphs Abs: 1 10*3/uL (ref 0.7–4.0)
MCH: 20.2 pg — ABNORMAL LOW (ref 26.0–34.0)
MCHC: 30 g/dL (ref 30.0–36.0)
MCV: 67.2 fL — ABNORMAL LOW (ref 78.0–100.0)
MONOS PCT: 11 % (ref 3–12)
Monocytes Absolute: 0.9 10*3/uL (ref 0.1–1.0)
NEUTROS ABS: 6 10*3/uL (ref 1.7–7.7)
Neutrophils Relative %: 74 % (ref 43–77)
Platelets: 143 10*3/uL — ABNORMAL LOW (ref 150–400)
RBC: 5.21 MIL/uL — AB (ref 3.87–5.11)
RDW: 17 % — ABNORMAL HIGH (ref 11.5–15.5)
SMEAR REVIEW: ADEQUATE
WBC: 8.2 10*3/uL (ref 4.0–10.5)

## 2014-06-22 LAB — BLOOD GAS, ARTERIAL
Acid-Base Excess: 2 mmol/L (ref 0.0–2.0)
Bicarbonate: 25.4 mEq/L — ABNORMAL HIGH (ref 20.0–24.0)
Drawn by: 38235
FIO2: 0.5 %
O2 Saturation: 89.2 %
PCO2 ART: 34.7 mmHg — AB (ref 35.0–45.0)
PO2 ART: 57.3 mmHg — AB (ref 80.0–100.0)
Patient temperature: 37
TCO2: 23.2 mmol/L (ref 0–100)
pH, Arterial: 7.476 — ABNORMAL HIGH (ref 7.350–7.450)

## 2014-06-22 LAB — COMPREHENSIVE METABOLIC PANEL
ALK PHOS: 137 U/L — AB (ref 39–117)
ALT: 9 U/L (ref 0–35)
AST: 19 U/L (ref 0–37)
Albumin: 3.9 g/dL (ref 3.5–5.2)
Anion gap: 18 — ABNORMAL HIGH (ref 5–15)
BUN: 42 mg/dL — ABNORMAL HIGH (ref 6–23)
CO2: 26 meq/L (ref 19–32)
Calcium: 9.3 mg/dL (ref 8.4–10.5)
Chloride: 88 mEq/L — ABNORMAL LOW (ref 96–112)
Creatinine, Ser: 1.82 mg/dL — ABNORMAL HIGH (ref 0.50–1.10)
GFR calc Af Amer: 30 mL/min — ABNORMAL LOW (ref >90)
GFR, EST NON AFRICAN AMERICAN: 26 mL/min — AB (ref >90)
Glucose, Bld: 214 mg/dL — ABNORMAL HIGH (ref 70–99)
Potassium: 3.5 mEq/L — ABNORMAL LOW (ref 3.7–5.3)
SODIUM: 132 meq/L — AB (ref 137–147)
TOTAL PROTEIN: 6.9 g/dL (ref 6.0–8.3)
Total Bilirubin: 0.7 mg/dL (ref 0.3–1.2)

## 2014-06-22 LAB — TROPONIN I: Troponin I: <0.3 ng/mL (ref <0.30)

## 2014-06-22 LAB — PRO B NATRIURETIC PEPTIDE: Pro B Natriuretic peptide (BNP): 3183 pg/mL — ABNORMAL HIGH (ref 0–450)

## 2014-06-22 MED ORDER — IPRATROPIUM-ALBUTEROL 0.5-2.5 (3) MG/3ML IN SOLN
3.0000 mL | Freq: Once | RESPIRATORY_TRACT | Status: AC
  Administered 2014-06-22: 3 mL via RESPIRATORY_TRACT
  Filled 2014-06-22: qty 3

## 2014-06-22 MED ORDER — METHYLPREDNISOLONE SODIUM SUCC 125 MG IJ SOLR
125.0000 mg | Freq: Once | INTRAMUSCULAR | Status: AC
  Administered 2014-06-22: 125 mg via INTRAVENOUS
  Filled 2014-06-22: qty 2

## 2014-06-22 MED ORDER — DOXYCYCLINE HYCLATE 100 MG IV SOLR
INTRAVENOUS | Status: AC
  Filled 2014-06-22: qty 100

## 2014-06-22 MED ORDER — LEVOTHYROXINE SODIUM 75 MCG PO TABS
150.0000 ug | ORAL_TABLET | Freq: Every day | ORAL | Status: DC
  Administered 2014-06-23 – 2014-06-25 (×3): 150 ug via ORAL
  Filled 2014-06-22: qty 1
  Filled 2014-06-22: qty 2
  Filled 2014-06-22: qty 1
  Filled 2014-06-22 (×2): qty 2

## 2014-06-22 MED ORDER — IPRATROPIUM-ALBUTEROL 0.5-2.5 (3) MG/3ML IN SOLN
3.0000 mL | RESPIRATORY_TRACT | Status: DC
  Administered 2014-06-23: 3 mL via RESPIRATORY_TRACT
  Filled 2014-06-22: qty 3

## 2014-06-22 MED ORDER — DOXYCYCLINE HYCLATE 100 MG IV SOLR
100.0000 mg | Freq: Two times a day (BID) | INTRAVENOUS | Status: DC
  Administered 2014-06-23: 100 mg via INTRAVENOUS
  Filled 2014-06-22 (×4): qty 100

## 2014-06-22 MED ORDER — PANTOPRAZOLE SODIUM 40 MG PO TBEC
40.0000 mg | DELAYED_RELEASE_TABLET | Freq: Two times a day (BID) | ORAL | Status: DC
  Administered 2014-06-23 – 2014-06-25 (×6): 40 mg via ORAL
  Filled 2014-06-22 (×6): qty 1

## 2014-06-22 MED ORDER — PRAVASTATIN SODIUM 10 MG PO TABS
20.0000 mg | ORAL_TABLET | Freq: Every day | ORAL | Status: DC
  Administered 2014-06-23 – 2014-06-24 (×2): 20 mg via ORAL
  Filled 2014-06-22: qty 2
  Filled 2014-06-22: qty 1
  Filled 2014-06-22: qty 2

## 2014-06-22 MED ORDER — IPRATROPIUM-ALBUTEROL 0.5-2.5 (3) MG/3ML IN SOLN: 3.0000 mL | RESPIRATORY_TRACT | Status: DC | PRN

## 2014-06-22 MED ORDER — SODIUM CHLORIDE 0.9 % IJ SOLN
3.0000 mL | Freq: Two times a day (BID) | INTRAMUSCULAR | Status: DC
  Administered 2014-06-23 – 2014-06-24 (×3): 3 mL via INTRAVENOUS

## 2014-06-22 MED ORDER — METHYLPREDNISOLONE SODIUM SUCC 125 MG IJ SOLR
125.0000 mg | Freq: Three times a day (TID) | INTRAMUSCULAR | Status: DC
  Administered 2014-06-23: 125 mg via INTRAVENOUS
  Filled 2014-06-22: qty 2

## 2014-06-22 MED ORDER — CARVEDILOL 3.125 MG PO TABS
3.1250 mg | ORAL_TABLET | Freq: Two times a day (BID) | ORAL | Status: DC
  Administered 2014-06-23 – 2014-06-25 (×5): 3.125 mg via ORAL
  Filled 2014-06-22 (×8): qty 1

## 2014-06-22 MED ORDER — SODIUM CHLORIDE 0.9 % IJ SOLN
3.0000 mL | Freq: Two times a day (BID) | INTRAMUSCULAR | Status: DC
  Administered 2014-06-23: 3 mL via INTRAVENOUS

## 2014-06-22 MED ORDER — BACLOFEN 10 MG PO TABS
20.0000 mg | ORAL_TABLET | Freq: Every day | ORAL | Status: DC | PRN
  Filled 2014-06-22: qty 1

## 2014-06-22 MED ORDER — SODIUM CHLORIDE 0.9 % IJ SOLN
3.0000 mL | INTRAMUSCULAR | Status: DC | PRN
Start: 2014-06-22 — End: 2014-06-25

## 2014-06-22 MED ORDER — OXYCODONE-ACETAMINOPHEN 5-325 MG PO TABS
1.0000 | ORAL_TABLET | Freq: Two times a day (BID) | ORAL | Status: DC
  Administered 2014-06-23 – 2014-06-25 (×6): 1 via ORAL
  Filled 2014-06-22 (×6): qty 1

## 2014-06-22 MED ORDER — ALBUTEROL SULFATE (2.5 MG/3ML) 0.083% IN NEBU
2.5000 mg | INHALATION_SOLUTION | Freq: Once | RESPIRATORY_TRACT | Status: AC
Start: 2014-06-22 — End: 2014-06-22
  Administered 2014-06-22: 2.5 mg via RESPIRATORY_TRACT
  Filled 2014-06-22: qty 3

## 2014-06-22 MED ORDER — ACETAMINOPHEN 500 MG PO TABS: 500.0000 mg | ORAL_TABLET | Freq: Four times a day (QID) | ORAL | Status: DC | PRN

## 2014-06-22 MED ORDER — POTASSIUM CHLORIDE CRYS ER 20 MEQ PO TBCR
40.0000 meq | EXTENDED_RELEASE_TABLET | Freq: Two times a day (BID) | ORAL | Status: DC
  Administered 2014-06-23: 40 meq via ORAL
  Filled 2014-06-22: qty 2

## 2014-06-22 MED ORDER — SODIUM CHLORIDE 0.9 % IV SOLN
250.0000 mL | INTRAVENOUS | Status: DC | PRN
Start: 2014-06-22 — End: 2014-06-25
  Administered 2014-06-23: 250 mL via INTRAVENOUS

## 2014-06-22 MED ORDER — HEPARIN SODIUM (PORCINE) 5000 UNIT/ML IJ SOLN
5000.0000 [IU] | Freq: Three times a day (TID) | INTRAMUSCULAR | Status: DC
  Administered 2014-06-23 – 2014-06-25 (×7): 5000 [IU] via SUBCUTANEOUS
  Filled 2014-06-22 (×7): qty 1

## 2014-06-22 MED ORDER — INSULIN ASPART 100 UNIT/ML ~~LOC~~ SOLN
0.0000 [IU] | SUBCUTANEOUS | Status: DC
  Administered 2014-06-23 (×2): 3 [IU] via SUBCUTANEOUS
  Administered 2014-06-23 (×3): 5 [IU] via SUBCUTANEOUS
  Administered 2014-06-23: 7 [IU] via SUBCUTANEOUS
  Administered 2014-06-24 (×2): 2 [IU] via SUBCUTANEOUS
  Administered 2014-06-24: 1 [IU] via SUBCUTANEOUS
  Administered 2014-06-24 (×2): 2 [IU] via SUBCUTANEOUS
  Administered 2014-06-24: 1 [IU] via SUBCUTANEOUS
  Administered 2014-06-25 (×2): 2 [IU] via SUBCUTANEOUS

## 2014-06-22 NOTE — ED Notes (Signed)
Pt says she has been feeling weak, sob at times.  Headache.   Pt on 02 at home

## 2014-06-22 NOTE — H&P (Addendum)
Hospitalist Admission History and Physical  Patient name: [name] Medical record number: [id number] Date of birth: 01-Apr-1935 Age: 79 y.o. Gender: female  Primary Care Provider: Rocky Morel, MD  Chief Complaint: acute resp failure with hypoxia, acute on chronic resp failure, COPD exacerbation, AKI  History of Present Illness:This is a 79 y.o. year old female with significant past medical history of chronic resp failure on 5L at home, chronic diastolic heart failure, pulm hypertension, type 2 DM presenting with acute resp failure with hypoxia, acute on chronic resp failure, COPD exacerbation. Pt reports increased WOB, cough, wheezing, rhinorrhea, nasal congestion over past 2-3 days. Baseline 5L [place] use at home. Has had increased WOB despite baseline O2 use. + sick contacts with similar sxs. No CP. No LE swelling. No orthopnea, PND.  Patient presented to the ER MAXIMUM TEMPERATURE 98.1, heart rate in the 100s, respirations in the intensive 20s, blood pressure in the 100s to 110s, satting in the mid to upper 80s on 5 L, improved to the upper 90s with a 50% Ventimask. Notable labs seem willing 10.5, creatinine 1.8 to, potassium 3.5, sodium 132. ProBNP 3200( baseline around 2000). Chest x-ray with no acute cardiopulmonary abnormalities and cardiac enlargement. ABG shows a mixed respiratory and metabolic alkalosis.  Filed Weights   06/22/14 1943  Weight: 89.359 kg (197 lb)   04/2014 Cards visit wt: 210 lbs   Assessment and Plan: [name] is a 79 y.o. year old female presenting with acute resp failure with hypoxia, AKI, mixed alkalosis    Active Problems:   Acute respiratory failure with hypoxia   AKI (acute kidney injury)   1- Acute resp failure with hypoxia  -Acute on chronic flare in setting of COPD -IV solumedrol  -scheduled nebs  -IV doxycycline -cont ventimask  -stepdown bed  -noted cardiac enlargement in setting of chronic diastolic heart failure 2/2 pulm  HTN -fairly euvolemic to minimally volume overloaded on exam -cont home regimen -consider gentle diuresis if sxs not improved by am   2- Mixed alkalosis  -resp and metabolic components-mild  -? Mild contraction alkalosis in setting of AKI  -treat as above -follow   3- AKI -baseline Cr around 1.3 -noted metabolic alkalosis in setting of diuretic use (? Contraction alkalosis-very mild) -noted elevate proBNP with cardiomegaly on imaging -fairly narrow volume window -gently diurese overnight  -follow   4- Chronic diastolic heart failure/severe pulm HTN  - 2/2 severe pulm HTN -fairly euvolemic on exam -fairly narrow hydration window -follow overnight  -cont supplemental O2  5- DM  -SSI-higher sugars with steroid  -A1C FEN/GI: heart healthy-carb modified diet  Prophylaxis: sub q heparin Disposition: pending further evaluation  Code Status:Full code    Patient Active Problem List   Diagnosis Date Noted  . Acute respiratory failure with hypoxia 06/22/2014  . Tachycardia 05/10/2014  . Rhinitis medicamentosa 05/10/2014  . Acute on chronic diastolic heart failure 70/17/7939  . Pulmonary hypertension 09/08/2013  . CAP (community acquired pneumonia) 09/06/2013  . PUD (peptic ulcer disease)- ASA stopped 06/26/2013  . Acute blood loss anemia- transfused 10/14- negative endoscopy 06/25/2013  . Hypokalemia 04/05/2013  . Hyponatremia 04/05/2013  . Thrombocytopenia, unspecified 04/05/2013  . Volume depletion 04/04/2013  . Weakness 04/04/2013  . Acute renal failure 04/04/2013  . Hypotension 12/25/2012  . Diastolic CHF, chronic- EF 65-70% May 2014 12/25/2012  . Unspecified hypothyroidism 10/14/2012  . Normal coronary arteries at cath 2011 10/14/2012  . Obesity, morbid (more than 100 lbs over ideal weight  or BMI > 40)   . Pulmonary hypertension, Severe by Echo 02/1713-pa pressure 74 mmHg   . 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 DM2 (diabetes mellitus, type 2) 10/13/2012  . Chronic respiratory failure with hypoxia 01/23/2012   Past Medical History: Past Medical History  Diagnosis Date  . Diabetes mellitus   . Hypertension   . Shingles   . GERD (gastroesophageal reflux disease)   . Abnormal heart rhythms   . Thyroid disease     hypthyroidism  . Pulmonary hypertension, moderate to severe     No evidence of CAD on Cath 2011  . Obesity, morbid (more than 100 lbs over ideal weight or BMI > 40)   . Chronic respiratory failure with hypoxia 01/23/2012    Related to OHS  . Glaucoma   . CHF (congestive heart failure)     Echo at [facility] 10/14/2012 see notes  . COPD (chronic obstructive pulmonary disease)   . Acute on chronic renal failure     Past Surgical History: Past Surgical History  Procedure Laterality Date  . Tubal ligation    . Ovary removed    . Knee arthroscopy      left  . Cardiovascular stress test  09/15/2006    EF 81%; normal perfusion all regions; LV normal in size; no scintigraphic evidence of inducible myocardial ischemia  . Esophagogastroduodenoscopy N/A 06/25/2013    Procedure: ESOPHAGOGASTRODUODENOSCOPY (EGD);  Surgeon: Cleotis Nipper, MD;  Location: [facility] ENDOSCOPY;  Service: Endoscopy;  Laterality: N/A;  pediatric upper endoscope; no sedation  . Cardiac catheterization  02/21/2010    EF 65%; PA pressure 86/21 with mean of 44; normal coronary arteries, primary pumonary hypertension; no significant wall motion abnormalities    Social History: History   Social History  . Marital Status: Widowed    Spouse Name: N/A    Number of Children: N/A  . Years of Education: N/A   Social History Main Topics  . Smoking status: Never Smoker   . Smokeless tobacco: Current User    Types: Snuff  . Alcohol Use: No  . Drug Use: No  . Sexual Activity: Yes    Birth Control/ Protection: Post-menopausal   Other Topics Concern  . None   Social History Narrative   8 th grade education    Previous job: farmer    Lives with brother   Denies cigarettes, etOH, drugs. Uses snuff since age 76     Family History: Family History  Problem Relation Age of Onset  . Heart attack Father 51  . Heart disease Mother 59  . Diabetes Mother   . Lymphoma Daughter   . Diabetes      siblings, son  . Coronary artery disease Brother   . Diabetes Brother   . Stroke Brother   . Diabetes Brother   . Diabetes Sister   . Hypertension Sister   . Diabetes Sister   . Heart disease Sister   . Hypertension Child   . Sleep apnea Child   . Hypertension Child   . Hypertension Child   . Arthritis/Rheumatoid Child   . Hyperlipidemia Child   . Hypertension Child     Allergies: Allergies  Allergen Reactions  . Aspirin Other (See Comments)    GI bleed/ PUD  . Darvocet [Propoxyphene N-Acetaminophen] Nausea And Vomiting  . Diphenhydramine Hcl Other (See Comments)    Glaucoma prevents patient from taking this medication   . Aloe  Vera Rash  . Penicillins Swelling and Rash    Current Facility-Administered Medications  Medication Dose Route Frequency Provider Last Rate Last Dose  . 0.9 %  sodium chloride infusion  250 mL Intravenous PRN Shanda Howells, MD      . doxycycline (VIBRAMYCIN) 100 mg in dextrose 5 % 250 mL IVPB  100 mg Intravenous Q12H Shanda Howells, MD      . heparin injection 5,000 Units  5,000 Units Subcutaneous 3 times per day Shanda Howells, MD      . Derrill Memo ON 06/23/2014] insulin aspart (novoLOG) injection 0-9 Units  0-9 Units Subcutaneous 6 times per day Shanda Howells, MD      . ipratropium-albuterol (DUONEB) 0.5-2.5 (3) MG/3ML nebulizer solution 3 mL  3 mL Nebulization Q2H Shanda Howells, MD      . ipratropium-albuterol (DUONEB) 0.5-2.5 (3) MG/3ML nebulizer solution 3 mL  3 mL Nebulization Q1H PRN Shanda Howells, MD      . methylPREDNISolone sodium succinate (SOLU-MEDROL) 125 mg/2 mL injection 125 mg  125 mg Intravenous 3 times per day Shanda Howells, MD      . sodium chloride 0.9 %  injection 3 mL  3 mL Intravenous Q12H Shanda Howells, MD      . sodium chloride 0.9 % injection 3 mL  3 mL Intravenous Q12H Shanda Howells, MD      . sodium chloride 0.9 % injection 3 mL  3 mL Intravenous PRN Shanda Howells, MD       Current Outpatient Prescriptions  Medication Sig Dispense Refill  . acetaminophen (TYLENOL) 500 MG tablet Take 500 mg by mouth every 6 (six) hours as needed for mild pain or moderate pain.      Marland Kitchen ALPRAZolam (XANAX) 0.5 MG tablet Take 0.5 mg by mouth 2 (two) times daily. May take up to 4 times daily as prescribed      . baclofen (LIORESAL) 20 MG tablet Take 20 mg by mouth daily as needed. For pain/spasms      . brimonidine (ALPHAGAN P) 0.1 % SOLN Place 1 drop into both eyes daily. BRAND ONLY      . carvedilol (COREG) 3.125 MG tablet Take 1 tablet (3.125 mg total) by mouth 2 (two) times daily with a meal.  60 tablet  6  . docusate sodium (STOOL SOFTENER) 100 MG capsule Take 100 mg by mouth 2 (two) times daily.      Marland Kitchen ezetimibe (ZETIA) 10 MG tablet Take 10 mg by mouth at bedtime.        . fexofenadine (ALLEGRA) 180 MG tablet Take 180 mg by mouth every morning.      Marland Kitchen glipiZIDE (GLUCOTROL) 10 MG tablet Take 10 mg by mouth 2 (two) times daily before a meal.        . iron polysaccharides (NIFEREX) 150 MG capsule Take 1 capsule (150 mg total) by mouth daily.  90 capsule  3  . levothyroxine (SYNTHROID, LEVOTHROID) 150 MCG tablet Take 150 mcg by mouth daily before breakfast.       . linagliptin (TRADJENTA) 5 MG TABS tablet Take 5 mg by mouth every morning.       . metolazone (ZAROXOLYN) 2.5 MG tablet Take 2.5 mg by mouth every Monday, Wednesday, and Friday. Take 1 tablet by mouth on Mondays, Wednesdays, Friday. Take 30 minutes prior to AM dose of torsemide as needed for FLUID      . Multiple Vitamin (MULTIVITAMIN WITH MINERALS) TABS tablet Take 1 tablet by mouth daily.      Marland Kitchen  oxyCODONE-acetaminophen (PERCOCET/ROXICET) 5-325 MG per tablet Take 1 tablet by mouth 2 (two) times  daily. May take 1 additional dose if needed for pain      . OXYGEN-HELIUM IN Inhale 4 L into the lungs continuous.      . pantoprazole (PROTONIX) 40 MG tablet Take 40 mg by mouth 2 (two) times daily.      . potassium chloride SA (K-DUR,KLOR-CON) 20 MEQ tablet Take 2 tablets (40 mEq total) by mouth 2 (two) times daily.  120 tablet  2  . pravastatin (PRAVACHOL) 20 MG tablet Take 20 mg by mouth every morning.       . torsemide (DEMADEX) 20 MG tablet Take 20 mg by mouth 2 (two) times daily.      . Travoprost, BAK Free, (TRAVATAN) 0.004 % SOLN ophthalmic solution Place 1 drop into both eyes at bedtime. *BRAND ONLY      . polyethylene glycol (MIRALAX / GLYCOLAX) packet Take 17 g by mouth as needed for mild constipation or moderate constipation.        Review Of Systems: 12 point ROS negative except as noted above in HPI.  Physical Exam: Filed Vitals:   06/22/14 2130  BP: 110/62  Pulse: 108  Temp:   Resp: 17    General: cooperative and moderately obese HEENT: PERRLA and extra ocular movement intact Heart: S1, S2 normal, no murmur, rub or gallop, regular rate and rhythm Lungs: expiratory wheezes and rhonchi throughout both lung fields Abdomen: abdomen is soft without significant tenderness, masses, organomegaly or guarding Extremities: trace edema bilaterally  Skin:no rashes Neurology: normal without focal findings  Labs and Imaging: Lab Results  Component Value Date/Time   NA 132* 06/22/2014  8:07 PM   K 3.5* 06/22/2014  8:07 PM   CL 88* 06/22/2014  8:07 PM   CO2 26 06/22/2014  8:07 PM   BUN 42* 06/22/2014  8:07 PM   CREATININE 1.82* 06/22/2014  8:07 PM   CREATININE 1.34* 06/14/2014  4:40 PM   GLUCOSE 214* 06/22/2014  8:07 PM   Lab Results  Component Value Date   WBC 8.2 06/22/2014   HGB 10.5* 06/22/2014   HCT 35.0* 06/22/2014   MCV 67.2* 06/22/2014   PLT 143* 06/22/2014    Dg Chest 2 View  06/22/2014   CLINICAL DATA:  Shortness of breath and weakness.  Headache  EXAM:  CHEST  2 VIEW  COMPARISON:  06/15/2014  FINDINGS: There is mild cardiac enlargement. Calcified atherosclerotic disease involves the thoracic aorta. Both lungs are clear. The visualized skeletal structures are unremarkable.  IMPRESSION: No acute cardiopulmonary abnormalities.  Cardiac enlargement and atherosclerotic disease   Electronically Signed   By: Kerby Moors M.D.   On: 06/22/2014 20:11           Shanda Howells MD  Pager: [phone]

## 2014-06-22 NOTE — ED Provider Notes (Signed)
CSN: [id number]     Arrival date & time 06/22/14  1933 History  This chart was scribed for Maudry Diego, MD by Delphia Grates, ED Scribe. This patient was seen in room [facility] and the patient's care was started at 9:12 PM.     Chief Complaint  Patient presents with  . Weakness    Patient is a 79 y.o. female presenting with weakness. The history is provided by the patient. No language interpreter was used.  Weakness This is a new problem. The problem occurs rarely. The problem has not changed since onset.Associated symptoms include shortness of breath. Pertinent negatives include no chest pain, no abdominal pain and no headaches. Nothing aggravates the symptoms. Nothing relieves the symptoms. She has tried nothing for the symptoms.    HPI Comments: [name] is a 79 y.o. female, with history of COPD, CHF, and DM, who presents to the Emergency Department complaining of weakness onset PTA. Patient recently developed a cold and suspects her symptoms are related. There is associated SOB and cough productive of white sputum. Patient is currently on 5L on O2 at home.   Past Medical History  Diagnosis Date  . Diabetes mellitus   . Hypertension   . Shingles   . GERD (gastroesophageal reflux disease)   . Abnormal heart rhythms   . Thyroid disease     hypthyroidism  . Pulmonary hypertension, moderate to severe     No evidence of CAD on Cath 2011  . Obesity, morbid (more than 100 lbs over ideal weight or BMI > 40)   . Chronic respiratory failure with hypoxia 01/23/2012    Related to OHS  . Glaucoma   . CHF (congestive heart failure)     Echo at [facility] 10/14/2012 see notes  . COPD (chronic obstructive pulmonary disease)   . Acute on chronic renal failure    Past Surgical History  Procedure Laterality Date  . Tubal ligation    . Ovary removed    . Knee arthroscopy      left  . Cardiovascular stress test  09/15/2006    EF 81%; normal perfusion all regions; LV normal in size; no  scintigraphic evidence of inducible myocardial ischemia  . Esophagogastroduodenoscopy N/A 06/25/2013    Procedure: ESOPHAGOGASTRODUODENOSCOPY (EGD);  Surgeon: Cleotis Nipper, MD;  Location: [facility] ENDOSCOPY;  Service: Endoscopy;  Laterality: N/A;  pediatric upper endoscope; no sedation  . Cardiac catheterization  02/21/2010    EF 65%; PA pressure 86/21 with mean of 44; normal coronary arteries, primary pumonary hypertension; no significant wall motion abnormalities   Family History  Problem Relation Age of Onset  . Heart attack Father 3  . Heart disease Mother 27  . Diabetes Mother   . Lymphoma Daughter   . Diabetes      siblings, son  . Coronary artery disease Brother   . Diabetes Brother   . Stroke Brother   . Diabetes Brother   . Diabetes Sister   . Hypertension Sister   . Diabetes Sister   . Heart disease Sister   . Hypertension Child   . Sleep apnea Child   . Hypertension Child   . Hypertension Child   . Arthritis/Rheumatoid Child   . Hyperlipidemia Child   . Hypertension Child    History  Substance Use Topics  . Smoking status: Never Smoker   . Smokeless tobacco: Current User    Types: Snuff  . Alcohol Use: No   OB History   Grav Para  Term Preterm Abortions TAB SAB Ect Mult Living                 Review of Systems  Constitutional: Negative for appetite change and fatigue.  HENT: Negative for congestion, ear discharge and sinus pressure.   Eyes: Negative for discharge.  Respiratory: Positive for cough and shortness of breath.   Cardiovascular: Negative for chest pain.  Gastrointestinal: Negative for abdominal pain and diarrhea.  Genitourinary: Negative for frequency and hematuria.  Musculoskeletal: Negative for back pain.  Skin: Negative for rash.  Neurological: Positive for weakness. Negative for seizures and headaches.  Psychiatric/Behavioral: Negative for hallucinations.      Allergies  Aspirin; Darvocet; Diphenhydramine hcl; Aloe vera; and  Penicillins  Home Medications   Prior to Admission medications   Medication Sig Start Date End Date Taking? Authorizing Provider  ALPRAZolam Duanne Moron) 0.5 MG tablet Take 0.5 mg by mouth 2 (two) times daily. May take up to 4 times daily as prescribed    Historical Provider, MD  bisacodyl (DULCOLAX) 10 MG suppository Place 10 mg rectally as needed for constipation.    Historical Provider, MD  brimonidine (ALPHAGAN P) 0.1 % SOLN Place 1 drop into both eyes daily.    Historical Provider, MD  carvedilol (COREG) 3.125 MG tablet Take 1 tablet (3.125 mg total) by mouth 2 (two) times daily with a meal. 05/03/14   Pixie Casino, MD  docusate sodium (STOOL SOFTENER) 100 MG capsule Take 100 mg by mouth 2 (two) times daily.    Historical Provider, MD  ezetimibe (ZETIA) 10 MG tablet Take 10 mg by mouth at bedtime.      Historical Provider, MD  fexofenadine (ALLEGRA) 180 MG tablet Take 180 mg by mouth every morning.    Historical Provider, MD  glipiZIDE (GLUCOTROL) 10 MG tablet Take 10 mg by mouth 2 (two) times daily before a meal.      Historical Provider, MD  iron polysaccharides (NIFEREX) 150 MG capsule Take 1 capsule (150 mg total) by mouth daily. 07/09/13   Pixie Casino, MD  levothyroxine (SYNTHROID, LEVOTHROID) 150 MCG tablet Take 150 mcg by mouth daily before breakfast.     Historical Provider, MD  linagliptin (TRADJENTA) 5 MG TABS tablet Take 5 mg by mouth every morning.     Historical Provider, MD  metolazone (ZAROXOLYN) 2.5 MG tablet Take 1 tablet by mouth on Mondays, Wednesdays, Friday. Take 30 minutes prior to AM dose of torsemide. 01/25/14   Pixie Casino, MD  oxyCODONE-acetaminophen (PERCOCET/ROXICET) 5-325 MG per tablet Take 1 tablet by mouth 2 (two) times daily. May take 1 additional dose if needed for pain    Historical Provider, MD  OXYGEN-HELIUM IN Inhale 4 L into the lungs continuous.    Historical Provider, MD  pantoprazole (PROTONIX) 40 MG tablet TAKE ONE TABLET TWICE DAILY 12/28/13    Pixie Casino, MD  polyethylene glycol (MIRALAX / GLYCOLAX) packet Take 17 g by mouth as needed.     Historical Provider, MD  potassium chloride SA (K-DUR,KLOR-CON) 20 MEQ tablet Take 2 tablets (40 mEq total) by mouth 2 (two) times daily. 09/16/13   Brett Canales, PA-C  pravastatin (PRAVACHOL) 20 MG tablet Take 20 mg by mouth daily.    Historical Provider, MD  torsemide (DEMADEX) 20 MG tablet TAKE THREE TABLETS BY MOUTH TWICE A DAY. 04/26/14   Pixie Casino, MD  Travoprost, BAK Free, (TRAVATAN) 0.004 % SOLN ophthalmic solution Place 1 drop into both eyes at bedtime.  Historical Provider, MD   Triage Vitals: BP 108/64  Pulse 106  Temp(Src) 98.1 F (36.7 C) (Oral)  Resp 20  Ht 5\' 1"  (1.549 m)  Wt 197 lb (89.359 kg)  BMI 37.24 kg/m2  SpO2 97%'  Physical Exam  Nursing note and vitals reviewed. Constitutional: She is oriented to person, place, and time. She appears well-developed.  HENT:  Head: Normocephalic.  Eyes: Conjunctivae and EOM are normal. No scleral icterus.  Neck: Neck supple. No thyromegaly present.  Cardiovascular: Normal rate and regular rhythm.  Exam reveals no gallop and no friction rub.   No murmur heard. Pulmonary/Chest: No stridor. She has wheezes. She has no rales. She exhibits no tenderness.  Moderate wheezing bilaterally.  Abdominal: She exhibits no distension. There is no tenderness. There is no rebound.  Musculoskeletal: Normal range of motion. She exhibits no edema.  Lymphadenopathy:    She has no cervical adenopathy.  Neurological: She is oriented to person, place, and time. She exhibits normal muscle tone. Coordination normal.  Skin: No rash noted. No erythema.  Psychiatric: She has a normal mood and affect. Her behavior is normal.    ED Course  Procedures (including critical care time)  DIAGNOSTIC STUDIES: Oxygen Saturation is 89% on [place], low by my interpretation.    COORDINATION OF CARE: At 2114 Discussed treatment plan with patient which  includes breathing treatment. Patient agrees.   Labs Review Labs Reviewed  CBC WITH DIFFERENTIAL - Abnormal; Notable for the following:    RBC 5.21 (*)    Hemoglobin 10.5 (*)    HCT 35.0 (*)    MCV 67.2 (*)    MCH 20.2 (*)    RDW 17.0 (*)    Platelets 143 (*)    All other components within normal limits  COMPREHENSIVE METABOLIC PANEL - Abnormal; Notable for the following:    Sodium 132 (*)    Potassium 3.5 (*)    Chloride 88 (*)    Glucose, Bld 214 (*)    BUN 42 (*)    Creatinine, Ser 1.82 (*)    Alkaline Phosphatase 137 (*)    GFR calc non Af Amer 26 (*)    GFR calc Af Amer 30 (*)    Anion gap 18 (*)    All other components within normal limits  PRO B NATRIURETIC PEPTIDE - Abnormal; Notable for the following:    Pro B Natriuretic peptide (BNP) 3183.0 (*)    All other components within normal limits  BLOOD GAS, ARTERIAL - Abnormal; Notable for the following:    pH, Arterial 7.476 (*)    pCO2 arterial 34.7 (*)    pO2, Arterial 57.3 (*)    Bicarbonate 25.4 (*)    All other components within normal limits  TROPONIN I    Imaging Review Dg Chest 2 View  06/22/2014   CLINICAL DATA:  Shortness of breath and weakness.  Headache  EXAM: CHEST  2 VIEW  COMPARISON:  06/15/2014  FINDINGS: There is mild cardiac enlargement. Calcified atherosclerotic disease involves the thoracic aorta. Both lungs are clear. The visualized skeletal structures are unremarkable.  IMPRESSION: No acute cardiopulmonary abnormalities.  Cardiac enlargement and atherosclerotic disease   Electronically Signed   By: Kerby Moors M.D.   On: 06/22/2014 20:11     EKG Interpretation None      MDM   Final diagnoses:  Weakness    Copd exacerbation  The chart was scribed for me under my direct supervision.  I personally performed the history,  physical, and medical decision making and all procedures in the evaluation of this patient.Maudry Diego, MD 06/22/14 [phone]

## 2014-06-23 LAB — GLUCOSE, CAPILLARY
GLUCOSE-CAPILLARY: 231 mg/dL — AB (ref 70–99)
GLUCOSE-CAPILLARY: 242 mg/dL — AB (ref 70–99)
Glucose-Capillary: 294 mg/dL — ABNORMAL HIGH (ref 70–99)
Glucose-Capillary: 264 mg/dL — ABNORMAL HIGH (ref 70–99)
Glucose-Capillary: 338 mg/dL — ABNORMAL HIGH (ref 70–99)
Glucose-Capillary: 285 mg/dL — ABNORMAL HIGH (ref 70–99)

## 2014-06-23 LAB — CBC WITH DIFFERENTIAL/PLATELET
BASOS ABS: 0 10*3/uL (ref 0.0–0.1)
Basophils Relative: 0 % (ref 0–1)
Eosinophils Absolute: 0 10*3/uL (ref 0.0–0.7)
Eosinophils Relative: 0 % (ref 0–5)
HCT: 34.2 % — ABNORMAL LOW (ref 36.0–46.0)
Hemoglobin: 10 g/dL — ABNORMAL LOW (ref 12.0–15.0)
LYMPHS ABS: 0.4 10*3/uL — AB (ref 0.7–4.0)
Lymphocytes Relative: 7 % — ABNORMAL LOW (ref 12–46)
MCH: 19.6 pg — AB (ref 26.0–34.0)
MCHC: 29.2 g/dL — ABNORMAL LOW (ref 30.0–36.0)
MCV: 67.1 fL — AB (ref 78.0–100.0)
MONO ABS: 0.1 10*3/uL (ref 0.1–1.0)
Monocytes Relative: 2 % — ABNORMAL LOW (ref 3–12)
Neutro Abs: 4.9 10*3/uL (ref 1.7–7.7)
Neutrophils Relative %: 91 % — ABNORMAL HIGH (ref 43–77)
Platelets: 140 10*3/uL — ABNORMAL LOW (ref 150–400)
RBC: 5.1 MIL/uL (ref 3.87–5.11)
RDW: 17 % — AB (ref 11.5–15.5)
Smear Review: ADEQUATE
WBC: 5.4 10*3/uL (ref 4.0–10.5)

## 2014-06-23 LAB — HEMOGLOBIN A1C
Hgb A1c MFr Bld: 7.5 % — ABNORMAL HIGH (ref <5.7)
Mean Plasma Glucose: 169 mg/dL — ABNORMAL HIGH (ref <117)

## 2014-06-23 LAB — COMPREHENSIVE METABOLIC PANEL
ALT: 8 U/L (ref 0–35)
AST: 17 U/L (ref 0–37)
Albumin: 3.8 g/dL (ref 3.5–5.2)
Alkaline Phosphatase: 129 U/L — ABNORMAL HIGH (ref 39–117)
Anion gap: 19 — ABNORMAL HIGH (ref 5–15)
BILIRUBIN TOTAL: 0.8 mg/dL (ref 0.3–1.2)
BUN: 42 mg/dL — ABNORMAL HIGH (ref 6–23)
CO2: 25 meq/L (ref 19–32)
CREATININE: 1.48 mg/dL — AB (ref 0.50–1.10)
Calcium: 9.3 mg/dL (ref 8.4–10.5)
Chloride: 93 mEq/L — ABNORMAL LOW (ref 96–112)
GFR calc Af Amer: 38 mL/min — ABNORMAL LOW (ref >90)
GFR calc non Af Amer: 33 mL/min — ABNORMAL LOW (ref >90)
Glucose, Bld: 288 mg/dL — ABNORMAL HIGH (ref 70–99)
POTASSIUM: 3.4 meq/L — AB (ref 3.7–5.3)
SODIUM: 137 meq/L (ref 137–147)
Total Protein: 6.7 g/dL (ref 6.0–8.3)

## 2014-06-23 LAB — MRSA PCR SCREENING: MRSA BY PCR: NEGATIVE

## 2014-06-23 MED ORDER — POTASSIUM CHLORIDE CRYS ER 20 MEQ PO TBCR
40.0000 meq | EXTENDED_RELEASE_TABLET | Freq: Three times a day (TID) | ORAL | Status: DC
  Administered 2014-06-23 – 2014-06-25 (×7): 40 meq via ORAL
  Filled 2014-06-23 (×7): qty 2

## 2014-06-23 MED ORDER — ALPRAZOLAM 0.5 MG PO TABS
0.5000 mg | ORAL_TABLET | Freq: Two times a day (BID) | ORAL | Status: DC | PRN
  Administered 2014-06-23 – 2014-06-25 (×4): 0.5 mg via ORAL
  Filled 2014-06-23 (×4): qty 1

## 2014-06-23 MED ORDER — DOXYCYCLINE HYCLATE 100 MG PO TABS
100.0000 mg | ORAL_TABLET | Freq: Two times a day (BID) | ORAL | Status: DC
  Administered 2014-06-23 – 2014-06-25 (×5): 100 mg via ORAL
  Filled 2014-06-23 (×5): qty 1

## 2014-06-23 MED ORDER — LINAGLIPTIN 5 MG PO TABS
5.0000 mg | ORAL_TABLET | Freq: Every morning | ORAL | Status: DC
  Administered 2014-06-23 – 2014-06-25 (×3): 5 mg via ORAL
  Filled 2014-06-23 (×3): qty 1

## 2014-06-23 MED ORDER — IPRATROPIUM-ALBUTEROL 0.5-2.5 (3) MG/3ML IN SOLN
3.0000 mL | RESPIRATORY_TRACT | Status: DC | PRN
  Administered 2014-06-24: 3 mL via RESPIRATORY_TRACT
  Filled 2014-06-23: qty 3

## 2014-06-23 MED ORDER — IPRATROPIUM-ALBUTEROL 0.5-2.5 (3) MG/3ML IN SOLN
3.0000 mL | RESPIRATORY_TRACT | Status: DC
  Administered 2014-06-23 (×2): 3 mL via RESPIRATORY_TRACT
  Filled 2014-06-23 (×2): qty 3

## 2014-06-23 MED ORDER — FUROSEMIDE 10 MG/ML IJ SOLN
40.0000 mg | Freq: Two times a day (BID) | INTRAMUSCULAR | Status: DC
Start: 2014-06-23 — End: 2014-06-24
  Administered 2014-06-23 – 2014-06-24 (×3): 40 mg via INTRAVENOUS
  Filled 2014-06-23 (×3): qty 4

## 2014-06-23 MED ORDER — FUROSEMIDE 10 MG/ML IJ SOLN
30.0000 mg | Freq: Once | INTRAMUSCULAR | Status: AC
  Administered 2014-06-23: 30 mg via INTRAVENOUS
  Filled 2014-06-23: qty 4

## 2014-06-23 NOTE — Progress Notes (Signed)
Patient out of bed to chair this afternoon.Family at bedside. Will continue to monitor patient.No c/o pain or discomfort noted at this time.

## 2014-06-23 NOTE — Progress Notes (Signed)
Inpatient Diabetes Program Recommendations  AACE/ADA: New Consensus Statement on Inpatient Glycemic Control (2013)  Target Ranges:  Prepandial:   less than 140 mg/dL      Peak postprandial:   less than 180 mg/dL (1-2 hours)      Critically ill patients:  140 - 180 mg/dL   Results for [name], [name] (MRN [id number]) as of 06/23/2014 09:41  Ref. Range 06/23/2014 00:58 06/23/2014 04:20 06/23/2014 07:38  Glucose-Capillary Latest Range: 70-99 mg/dL 242 (H) 294 (H) 264 (H)   Diabetes history: DM2 Outpatient Diabetes medications: Glipizide 10 mg BID, Tradjenta 5 mg QAM Current orders for Inpatient glycemic control: Novolog 0-9 units Q4H  Inpatient Diabetes Program Recommendations Insulin - Basal: Please consider ordering low dose basal insulin; recommend starting with Levemir 10 units daily. Correction (SSI): Please consider increasing Novolog to moderate scale and changing frequency to ACHS if patient is eating. A1C: Please consider ordering an A1C to evaluate glycemic control over the past 2-3 months.  Thanks, Barnie Alderman, RN, MSN, CCRN Diabetes Coordinator Inpatient Diabetes Program [phone] (Team Pager) [phone] ([facility] office) [phone] [name] office)

## 2014-06-23 NOTE — Progress Notes (Signed)
[name]:[id number] DOB: [birth year] DOA: 06/22/2014 PCP: Rocky Morel, MD  Brief narrative: 79 y/o ?,  Right-sided heart failure severe PA pressure 74  mmHg  On 5 L of oxygen at home/ cor pulmonale /OSA intolerant of CPAP last echo 01/12/13 EF 6570% date 1 DD , GI bleed 06/25/13 status post transfusion , sinus tachycardia , normal cardiac 2011 , super morbid obesity , her chronic splenomegaly , acute pancreatitis in the past who was admitted in the setting of having sick contacts with similar symptoms. Cough hypoxia nasal congestion.   Found to have congestion on chest x-ray, proBNP 3183.   She actually had call the office with 5 pound increase  She had a chest x-ray on 10/20 that was clear show any volume overload. BNP done at that time was 900.  Past medical history-As per Problem list Chart reviewed as below-  reviewed  Consultants:  cardiology  Procedures:  Chest x-ray\  Antibiotics:  none   Subjective   feels a little less short of breath however was an 11 liters of oxygen  At baseline always is a little short of breath and can only ambulate No SOB currently out of her usual.  Still has a cold and stuffiness NO Le swelling nor Pnd nor Orthopnea    Objective    Interim History:   Telemetry: Sinus tach 120's   Objective: Filed Vitals:   06/23/14 0500 06/23/14 0600 06/23/14 0646 06/23/14 0753  BP:      Pulse: 108 107    Temp:    97.7 F (36.5 C)  TempSrc:    Oral  Resp: 16 15    Height:      Weight: 96 kg (211 lb 10.3 oz)     SpO2: 91% 93% 94%     Intake/Output Summary (Last 24 hours) at 06/23/14 0758 Last data filed at 06/23/14 0600  Gross per 24 hour  Intake  303.5 ml  Output    725 ml  Net -421.5 ml    Exam:  General: eomi, ncat no palor, no ict Cardiovascular: s1 s2 tachy Respiratory: cinically clear, no wheeze no rales. Abdomen: soft, NT, ND Skin no LE edema Neuro intact  Data Reviewed: Basic Metabolic Panel:  Recent  Labs Lab 06/22/14 2007 06/23/14 0436  NA 132* 137  K 3.5* 3.4*  CL 88* 93*  CO2 26 25  GLUCOSE 214* 288*  BUN 42* 42*  CREATININE 1.82* 1.48*  CALCIUM 9.3 9.3   Liver Function Tests:  Recent Labs Lab 06/22/14 2007 06/23/14 0436  AST 19 17  ALT 9 8  ALKPHOS 137* 129*  BILITOT 0.7 0.8  PROT 6.9 6.7  ALBUMIN 3.9 3.8   No results found for this basename: LIPASE, AMYLASE,  in the last 168 hours No results found for this basename: AMMONIA,  in the last 168 hours CBC:  Recent Labs Lab 06/22/14 2007 06/23/14 0436  WBC 8.2 5.4  NEUTROABS 6.0 4.9  HGB 10.5* 10.0*  HCT 35.0* 34.2*  MCV 67.2* 67.1*  PLT 143* 140*   Cardiac Enzymes:  Recent Labs Lab 06/22/14 2007  TROPONINI <0.30   BNP: No components found with this basename: POCBNP,  CBG:  Recent Labs Lab 06/23/14 0058 06/23/14 0420 06/23/14 0738  GLUCAP 242* 294* 264*    Recent Results (from the past 240 hour(s))  MRSA PCR SCREENING     Status: None   Collection Time    06/23/14 12:15 AM  Result Value Ref Range Status   MRSA by PCR NEGATIVE  NEGATIVE Final   Comment:            The GeneXpert MRSA Assay (FDA     approved for NASAL specimens     only), is one component of a     comprehensive MRSA colonization     surveillance program. It is not     intended to diagnose MRSA     infection nor to guide or     monitor treatment for     MRSA infections.     Studies:              All Imaging reviewed and is as per above notation   Scheduled Meds: . carvedilol  3.125 mg Oral BID WC  . doxycycline (VIBRAMYCIN) IV  100 mg Intravenous Q12H  . heparin  5,000 Units Subcutaneous 3 times per day  . insulin aspart  0-9 Units Subcutaneous 6 times per day  . ipratropium-albuterol  3 mL Nebulization Q4H WA  . levothyroxine  150 mcg Oral QAC breakfast  . methylPREDNISolone sodium succinate  125 mg Intravenous 3 times per day  . oxyCODONE-acetaminophen  1 tablet Oral BID  . pantoprazole  40 mg Oral BID    . potassium chloride SA  40 mEq Oral TID  . pravastatin  20 mg Oral QPC supper  . sodium chloride  3 mL Intravenous Q12H  . sodium chloride  3 mL Intravenous Q12H   Continuous Infusions:    Assessment/Plan:  1. Acute mixed respiratory failure-2/2 to Pulm Htn from decompensated diastolic HF-see below 2. Acute decompensated Severe Pulm Htn, group 3,  Last PA Peak 67 mm hg 08/2013-difficult situation.  Cannot aggressively diurese as RV is preload dependant.  Will continue IV lasix at dose 40 bid and consult cardiology and pulmonology.  She was supposed to have been started on Ivradabine based on some documentation in the past.  Consider Oral Calcium channel blocker.  sHe is non-compliant on CPAP which will affect her long term survival 3. Acute Decompensated Diastolic HF-see above discussion. 4. Unlikely COPD-never smoker-discontinue steroids as no wheeze nor role for the same.  continue Doxycycline for sputum and URI symptoms.  Place PRN order for Nebulization every 4 hours 5. DM ty 2 on orals-CBG's 242-294.  Worsened by steroids.  Continue SSI 6. Sinus tachycardia 2/2 to inhalers but has been noted in the past.  Continue Coreg 3.125.  Furthe rcontorl limited by low blood pressure 7. HLd-continue Pravachol 20 qpm 8. Hypothyroid-continue Levothyroxine 150 mcg q am.  Get TSH in about 4 weeks   Code Status: Full Family Communication:  No family at bedsdie Disposition Plan:  Keep on SDU   [name], MD  [facility] Pager [phone] 06/23/2014, 7:58 AM    LOS: 1 day

## 2014-06-23 NOTE — Care Management Note (Unsigned)
    Page 1 of 1   06/24/2014     5:37:50 PM CARE MANAGEMENT NOTE 06/24/2014  Patient:  NOHELIA, VALENZA   Account Number:  [id number]  Date Initiated:  06/23/2014  Documentation initiated by:  Theophilus Kinds  Subjective/Objective Assessment:   Pt admitted from home with CHF/acute respiratory failure. Pt lives with her brother and will return home at discharge. Pt has a cane, walker, home O2, cpap for home use. Pt has CAP aide M-F, 4 hours a day. Pt followed by [facility].     Action/Plan:   Will continue to follow for discharge planning needs.   Anticipated DC Date:  06/27/2014   Anticipated DC Plan:  [place]  CM consult      Choice offered to / List presented to:             Status of service:  In process, will continue to follow Medicare Important Message given?  YES (If response is "NO", the following Medicare IM given date fields will be blank) Date Medicare IM given:  06/24/2014 Medicare IM given by:  Vladimir Creeks Date Additional Medicare IM given:   Additional Medicare IM given by:    Discharge Disposition:    Per UR Regulation:    If discussed at Long Length of Stay Meetings, dates discussed:    Comments:  06/24/14 [place] RN/CM    [place] by her daughters this next week if OK with MD at D/C, so she does not want [place] to follow, and since she is followed by [facility] 06/23/14 [place], RN BSN CM

## 2014-06-23 NOTE — Care Management Utilization Note (Signed)
UR completed 

## 2014-06-23 NOTE — Progress Notes (Signed)
Patient being transferred to department 300,report given to [facility]. Vital signs stable. No c/o pain or discomfort noted. Accompanied by staff to awaiting floor.Family at bedside.Transported with Oxygen at  4.5 liters nasal canula.

## 2014-06-24 DIAGNOSIS — J9611 Chronic respiratory failure with hypoxia: Secondary | ICD-10-CM

## 2014-06-24 DIAGNOSIS — I5023 Acute on chronic systolic (congestive) heart failure: Secondary | ICD-10-CM

## 2014-06-24 DIAGNOSIS — I50813 Acute on chronic right heart failure: Secondary | ICD-10-CM

## 2014-06-24 LAB — COMPREHENSIVE METABOLIC PANEL
ALT: 7 U/L (ref 0–35)
ANION GAP: 12 (ref 5–15)
AST: 17 U/L (ref 0–37)
Albumin: 3.8 g/dL (ref 3.5–5.2)
Alkaline Phosphatase: 121 U/L — ABNORMAL HIGH (ref 39–117)
BILIRUBIN TOTAL: 0.7 mg/dL (ref 0.3–1.2)
BUN: 47 mg/dL — AB (ref 6–23)
CO2: 30 mEq/L (ref 19–32)
CREATININE: 1.51 mg/dL — AB (ref 0.50–1.10)
Calcium: 9.7 mg/dL (ref 8.4–10.5)
Chloride: 95 mEq/L — ABNORMAL LOW (ref 96–112)
GFR calc Af Amer: 37 mL/min — ABNORMAL LOW (ref >90)
GFR, EST NON AFRICAN AMERICAN: 32 mL/min — AB (ref >90)
GLUCOSE: 142 mg/dL — AB (ref 70–99)
POTASSIUM: 5 meq/L (ref 3.7–5.3)
Sodium: 137 mEq/L (ref 137–147)
Total Protein: 6.6 g/dL (ref 6.0–8.3)

## 2014-06-24 LAB — GLUCOSE, CAPILLARY
GLUCOSE-CAPILLARY: 139 mg/dL — AB (ref 70–99)
GLUCOSE-CAPILLARY: 177 mg/dL — AB (ref 70–99)
Glucose-Capillary: 196 mg/dL — ABNORMAL HIGH (ref 70–99)
Glucose-Capillary: 158 mg/dL — ABNORMAL HIGH (ref 70–99)
Glucose-Capillary: 148 mg/dL — ABNORMAL HIGH (ref 70–99)
Glucose-Capillary: 198 mg/dL — ABNORMAL HIGH (ref 70–99)

## 2014-06-24 MED ORDER — TORSEMIDE 20 MG PO TABS: 20.0000 mg | ORAL_TABLET | Freq: Two times a day (BID) | ORAL | Status: DC

## 2014-06-24 MED ORDER — TORSEMIDE 20 MG PO TABS
20.0000 mg | ORAL_TABLET | Freq: Every evening | ORAL | Status: DC
  Administered 2014-06-24: 20 mg via ORAL
  Filled 2014-06-24: qty 1

## 2014-06-24 MED ORDER — TORSEMIDE 20 MG PO TABS
40.0000 mg | ORAL_TABLET | Freq: Every morning | ORAL | Status: DC
  Administered 2014-06-25: 40 mg via ORAL
  Filled 2014-06-24: qty 2

## 2014-06-24 NOTE — Progress Notes (Signed)
Patient Saturation is 81-85 on 6lpm/[place]  She does not appear short of breath at all.

## 2014-06-24 NOTE — Progress Notes (Signed)
Inpatient Diabetes Program Recommendations  AACE/ADA: New Consensus Statement on Inpatient Glycemic Control (2013)  Target Ranges:  Prepandial:   less than 140 mg/dL      Peak postprandial:   less than 180 mg/dL (1-2 hours)      Critically ill patients:  140 - 180 mg/dL   Diabetes history: DM2   Outpatient Diabetes medications: Glipizide 10 mg BID, Tradjenta 5 mg QAM   Current orders for Inpatient glycemic control: Novolog 0-9 units Q4H   Inpatient Diabetes Program Recommendations  Insulin - Basal: Please consider ordering low dose basal insulin; recommend starting with Levemir 10 units daily.  Correction (SSI): Please consider increasing Novolog to moderate scale and changing frequency to ACHS since patient is eating.   Gentry Fitz, RN, BA, MHA, CDE Diabetes Coordinator Inpatient Diabetes Program  [phone] (Team Pager) [phone] [facility] Office) 06/24/2014 8:20 AM

## 2014-06-24 NOTE — Progress Notes (Signed)
[name] BCW:[id number] DOB: 09-01-1934 DOA: 06/22/2014 PCP: Rocky Morel, MD  Brief narrative:  79 y/o ?,  Right-sided heart failure severe PA pressure 74  mmHg  On 5 L of oxygen at home/ cor pulmonale /OSA intolerant of CPAP last echo 01/12/13 EF 6570% date 1 DD , GI bleed 06/25/13 status post transfusion , sinus tachycardia , normal cardiac 2011 , super morbid obesity , her chronic splenomegaly , acute pancreatitis in the past who was admitted in the setting of having sick contacts with similar symptoms. Cough hypoxia nasal congestion.   Found to have congestion on chest x-ray, proBNP 3183.   She actually had call the office with 5 pound increase  She had a chest x-ray on 10/20 that was clear show any volume overload. BNP done at that time was 900.  Past medical history-As per Problem list Chart reviewed as below-  reviewed  Consultants:  cardiology  Procedures:  Chest x-ray  Antibiotics:  none   Subjective   Feels better No n/v/cp tol po well.  Feels fair otherwise Close to 8/10 in terms of feeling better  "10" being optimal    Objective    Interim History:   Telemetry: Sinus tach 120's   Objective: Filed Vitals:   06/23/14 1429 06/23/14 1447 06/23/14 2250 06/24/14 0300  BP: 109/61 139/69 121/49 135/74  Pulse: 114 114 97 95  Temp:  98.8 F (37.1 C) 98.8 F (37.1 C) 98 F (36.7 C)  TempSrc:  Oral Oral Oral  Resp: 25 20 20 20   Height:      Weight:    95 kg (209 lb 7 oz)  SpO2: 92% 92% 90% 90%    Intake/Output Summary (Last 24 hours) at 06/24/14 1428 Last data filed at 06/24/14 1325  Gross per 24 hour  Intake 565.17 ml  Output    600 ml  Net -34.83 ml    Exam:  General: eomi, ncat no palor, no ict Cardiovascular: s1 s2 tachy Respiratory: cinically clear, no wheeze no rales. Abdomen: soft, NT, ND Skin no LE edema Neuro intact  Data Reviewed: Basic Metabolic Panel:  Recent Labs Lab 06/22/14 2007 06/23/14 0436  06/24/14 0627  NA 132* 137 137  K 3.5* 3.4* 5.0  CL 88* 93* 95*  CO2 26 25 30   GLUCOSE 214* 288* 142*  BUN 42* 42* 47*  CREATININE 1.82* 1.48* 1.51*  CALCIUM 9.3 9.3 9.7   Liver Function Tests:  Recent Labs Lab 06/22/14 2007 06/23/14 0436 06/24/14 0627  AST 19 17 17   ALT 9 8 7   ALKPHOS 137* 129* 121*  BILITOT 0.7 0.8 0.7  PROT 6.9 6.7 6.6  ALBUMIN 3.9 3.8 3.8   No results found for this basename: LIPASE, AMYLASE,  in the last 168 hours No results found for this basename: AMMONIA,  in the last 168 hours CBC:  Recent Labs Lab 06/22/14 2007 06/23/14 0436  WBC 8.2 5.4  NEUTROABS 6.0 4.9  HGB 10.5* 10.0*  HCT 35.0* 34.2*  MCV 67.2* 67.1*  PLT 143* 140*   Cardiac Enzymes:  Recent Labs Lab 06/22/14 2007  TROPONINI <0.30   BNP: No components found with this basename: POCBNP,  CBG:  Recent Labs Lab 06/23/14 1949 06/24/14 0107 06/24/14 0406 06/24/14 0804 06/24/14 1139  GLUCAP 231* 196* 158* 139* 148*    Recent Results (from the past 240 hour(s))  MRSA PCR SCREENING     Status: None   Collection Time    06/23/14 12:15 AM  Result Value Ref Range Status   MRSA by PCR NEGATIVE  NEGATIVE Final   Comment:            The GeneXpert MRSA Assay (FDA     approved for NASAL specimens     only), is one component of a     comprehensive MRSA colonization     surveillance program. It is not     intended to diagnose MRSA     infection nor to guide or     monitor treatment for     MRSA infections.     Studies:              All Imaging reviewed and is as per above notation   Scheduled Meds: . carvedilol  3.125 mg Oral BID WC  . doxycycline  100 mg Oral Q12H  . heparin  5,000 Units Subcutaneous 3 times per day  . insulin aspart  0-9 Units Subcutaneous 6 times per day  . levothyroxine  150 mcg Oral QAC breakfast  . linagliptin  5 mg Oral q morning - 10a  . oxyCODONE-acetaminophen  1 tablet Oral BID  . pantoprazole  40 mg Oral BID  . potassium chloride  SA  40 mEq Oral TID  . pravastatin  20 mg Oral QPC supper  . sodium chloride  3 mL Intravenous Q12H  . sodium chloride  3 mL Intravenous Q12H  . torsemide  20 mg Oral QPM  . [START ON 06/25/2014] torsemide  40 mg Oral q morning - 10a   Continuous Infusions:    Assessment/Plan:  1. Acute mixed respiratory failure-2/2 to Pulm Htn from decompensated diastolic HF-see below 2. Acute decompensated Severe Pulm Htn, group 3,  Last PA Peak 67 mm hg 08/2013-difficult situation.  Cannot aggressively diurese as RV is preload dependant.  Appreciate Cardiology input-they have switched patient over to Demadex 40 am, 20 pm.  -900 cc overnight.  Her dry weight is reportedly 215 lbs 3. Unlikely COPD-never smoker-discontinue steroids as no wheeze nor role for the same.  continue Doxycycline for sputum and URI symptoms.  Place PRN order for Nebulization every 4 hours 4. DM ty 2 on orals-CBG's 242-294.  Continue Linagliptin 5 mg daily.  Worsened by steroids.  Continue SSI for now.  CBG's 139-159 5. Sinus tachycardia 2/2 to inhalers but has been noted in the past.  Continue Coreg 3.125.  Furthe rcontorl limited by low blood pressure 6. HLd-continue Pravachol 20 qpm 7. Hypothyroid-continue Levothyroxine 150 mcg q am.  Get TSH in about 4 weeks  Code Status: Full Family Communication:  None bedisde Disposition Plan:  Can likely d/c in 1-2 days   [name], MD  [facility] Pager [phone] 06/24/2014, 2:28 PM    LOS: 2 days

## 2014-06-24 NOTE — Consult Note (Signed)
CARDIOLOGY CONSULT NOTE   Patient ID: KATHRYNN BACKSTROM MRN: [id number] DOB/AGE: 03/28/1935 79 y.o.  Admit Date: 06/22/2014 Referring Physician: PTH Primary Physician: Rocky Morel, MD Consulting Cardiologist: Carlyle Dolly MD Primary Cardiologist: Lyman Bishop MD Areatha Keas) Reason for Consultation: Diastolic CHF, Cor Pulmonale, with Pulmonary Hypertension  Clinical Summary Ms. Littlepage is a 79 y.o.female with multiple medical problems. From a cardiac standpoint she has a history of right heart failure, pulmonary hypertension, multiple admission for diastolic CHF, (dry wt of 130)[id number]. Has hx of GI bleed and was taken off of Plavix and ASA (no indication for this medication with normal coronaries per cath 2011, . She was started on low dose coreg 3.125 mg BID in Sept of 2015 office visit with complaints of palpitations and questionable ectopic atrial tachycardia per EKG in office.  She was admitted with acute respiratory failure with hypoxia, and COPD exacerbation, with symptoms of coughing, wheezing, dyspnea, with nasal congestion.She states that cold-like symptoms began about 2 weeks ago and was treated with OTC medications.  Two days prior to coming to ER she began to have 3 pillow orthopnea. PND and Class IV symptoms, with near syncope. No chest pain, no edema. States HR was going up. She took anti-anxiety medications thinking it would help. States that her HR was up even though coreg was being taken as directed. It helped "some" during her breathing issues, and "helped a lot" when she was not sick.    She is normally O2 dependent at home on 77 liters with NYHA Class III symptoms chronically. IN ER she was sating 89% , requiring venti mask.   BP was normal with HR of 105 bpm, EKG sinus rhythm vs ectopic atrial tachycardia (p-waves noted in QRS with prolonged PR interval but difficult to distinguishl) rate of 106. Potassium 3.4, but has now increased to 5.0 with  replacement.  CXR negative for pneumonia or CHF. She has been placed on IV diuresis, lasix 40 mg IV Q 12 hours with <1 liter urine output so far. She is feeling much better without dyspnea. Able to walk around in the room and move her recliner so that she can see the TV. Now back on O2 at 5/L. She is being treated with neb tx and doxcycline    Allergies  Allergen Reactions  . Aspirin Other (See Comments)    GI bleed/ PUD  . Darvocet [Propoxyphene N-Acetaminophen] Nausea And Vomiting  . Diphenhydramine Hcl Other (See Comments)    Glaucoma prevents patient from taking this medication   . Aloe Vera Rash  . Penicillins Swelling and Rash    Medications Scheduled Medications: . carvedilol  3.125 mg Oral BID WC  . doxycycline  100 mg Oral Q12H  . furosemide  40 mg Intravenous Q12H  . heparin  5,000 Units Subcutaneous 3 times per day  . insulin aspart  0-9 Units Subcutaneous 6 times per day  . levothyroxine  150 mcg Oral QAC breakfast  . linagliptin  5 mg Oral q morning - 10a  . oxyCODONE-acetaminophen  1 tablet Oral BID  . pantoprazole  40 mg Oral BID  . potassium chloride SA  40 mEq Oral TID  . pravastatin  20 mg Oral QPC supper  . sodium chloride  3 mL Intravenous Q12H  . sodium chloride  3 mL Intravenous Q12H    Infusions:    PRN Medications: sodium chloride, acetaminophen, ALPRAZolam, baclofen, ipratropium-albuterol, sodium chloride   Past Medical History  Diagnosis Date  . Diabetes  mellitus   . Hypertension   . Shingles   . GERD (gastroesophageal reflux disease)   . Abnormal heart rhythms   . Thyroid disease     hypthyroidism  . Pulmonary hypertension, moderate to severe     No evidence of CAD on Cath 2011  . Obesity, morbid (more than 100 lbs over ideal weight or BMI > 40)   . Chronic respiratory failure with hypoxia 01/23/2012    Related to OHS  . Glaucoma   . CHF (congestive heart failure)     Echo at [facility] 10/14/2012 see notes  . COPD (chronic obstructive  pulmonary disease)   . Acute on chronic renal failure     Past Surgical History  Procedure Laterality Date  . Tubal ligation    . Ovary removed    . Knee arthroscopy      left  . Cardiovascular stress test  09/15/2006    EF 81%; normal perfusion all regions; LV normal in size; no scintigraphic evidence of inducible myocardial ischemia  . Esophagogastroduodenoscopy N/A 06/25/2013    Procedure: ESOPHAGOGASTRODUODENOSCOPY (EGD);  Surgeon: Cleotis Nipper, MD;  Location: [facility] ENDOSCOPY;  Service: Endoscopy;  Laterality: N/A;  pediatric upper endoscope; no sedation  . Cardiac catheterization  02/21/2010    EF 65%; PA pressure 86/21 with mean of 44; normal coronary arteries, primary pumonary hypertension; no significant wall motion abnormalities    Family History  Problem Relation Age of Onset  . Heart attack Father 41  . Heart disease Mother 48  . Diabetes Mother   . Lymphoma Daughter   . Diabetes      siblings, son  . Coronary artery disease Brother   . Diabetes Brother   . Stroke Brother   . Diabetes Brother   . Diabetes Sister   . Hypertension Sister   . Diabetes Sister   . Heart disease Sister   . Hypertension Child   . Sleep apnea Child   . Hypertension Child   . Hypertension Child   . Arthritis/Rheumatoid Child   . Hyperlipidemia Child   . Hypertension Child     Social History Ms. Weesner reports that she has never smoked. Her smokeless tobacco use includes Snuff. Ms. Reither reports that she does not drink alcohol.  Review of Systems Otherwise reviewed and negative except as outlined.  Physical Examination Blood pressure 135/74, pulse 95, temperature 98 F (36.7 C), temperature source Oral, resp. rate 20, height 5\' 1"  (1.549 m), weight 209 lb 7 oz (95 kg), SpO2 90.00%.  Intake/Output Summary (Last 24 hours) at 06/24/14 1141 Last data filed at 06/24/14 0915  Gross per 24 hour  Intake 325.17 ml  Output    400 ml  Net -74.83 ml    Telemetry:  GEN: HEENT:  Conjunctiva and lids normal, oropharynx clear with moist mucosa. Neck: Supple, no elevated JVP or carotid bruits, no thyromegaly. Lungs: Clear to auscultation, nonlabored breathing at rest. Cardiac: Regular rate and rhythm, no S3 or significant systolic murmur, no pericardial rub. Abdomen: Soft, nontender, no hepatomegaly, bowel sounds present, no guarding or rebound. Extremities: No pitting edema, distal pulses 2+. Skin: Warm and dry. Musculoskeletal: No kyphosis. Neuropsychiatric: Alert and oriented x3, affect grossly appropriate.  Prior Cardiac Testing/Procedures 1.Echo 09/08/2013 Left ventricle: Systolic function was normal. The estimated ejection fraction was in the range of 55% to 60%. Wall motion was normal; there were no regional wall motion abnormalities. Due to tachycardia, there was fusion of early and atrial contributions to ventricular filling. The  study is not technically sufficient to allow evaluation of LV diastolic function. - Ventricular septum: The contour showed systolic flattening. These changes are consistent with RV pressure overload. - Aortic valve: Trivial regurgitation. - Right ventricle: The cavity size was severely dilated. Systolic function was severely reduced. - Right atrium: The atrium was mildly dilated. - Tricuspid valve: Moderate regurgitation. - Pulmonary arteries: Systolic pressure was moderately to severely increased. PA peak pressure: 89mm Hg (S).  NM  Stress test 01/2010 EF 81%; normal perfusion all regions; LV normal in size; no scintigraphic evidence of inducible myocardial ischemia  .  Cardiac catheterization  02/21/2010  EF 65%; PA pressure 86/21 with mean of 44; normal coronary arteries, primary pumonary hypertension; no significant wall motion abnormalities   Lab Results  Basic Metabolic Panel:  Recent Labs Lab 06/22/14 2007 06/23/14 0436 06/24/14 0627  NA 132* 137 137  K 3.5* 3.4* 5.0  CL 88* 93* 95*  CO2 26 25 30   GLUCOSE  214* 288* 142*  BUN 42* 42* 47*  CREATININE 1.82* 1.48* 1.51*  CALCIUM 9.3 9.3 9.7    Liver Function Tests:  Recent Labs Lab 06/22/14 2007 06/23/14 0436 06/24/14 0627  AST 19 17 17   ALT 9 8 7   ALKPHOS 137* 129* 121*  BILITOT 0.7 0.8 0.7  PROT 6.9 6.7 6.6  ALBUMIN 3.9 3.8 3.8    CBC:  Recent Labs Lab 06/22/14 2007 06/23/14 0436  WBC 8.2 5.4  NEUTROABS 6.0 4.9  HGB 10.5* 10.0*  HCT 35.0* 34.2*  MCV 67.2* 67.1*  PLT 143* 140*    Cardiac Enzymes:  Recent Labs Lab 06/22/14 2007  TROPONINI <0.30    BNP: 3,183   Radiology: Dg Chest 2 View  06/22/2014   CLINICAL DATA:  Shortness of breath and weakness.  Headache  EXAM: CHEST  2 VIEW  COMPARISON:  06/15/2014  FINDINGS: There is mild cardiac enlargement. Calcified atherosclerotic disease involves the thoracic aorta. Both lungs are clear. The visualized skeletal structures are unremarkable.  IMPRESSION: No acute cardiopulmonary abnormalities.  Cardiac enlargement and atherosclerotic disease   Electronically Signed   By: Kerby Moors M.D.   On: 06/22/2014 20:11     ECG: NSR with possible ectopic tachycardia rate of 106   Impression and Recommendations  1.Acute Respiratory Failure: Known history of COPD with exacerbation. She has had much improvement with abx, Neb tx and has also been given IV lasix. She is up in the room without symptoms and back to baseline concerning breathing status.   2. Hx of Right Heart Failure: She did not have LEE, or abdominal distention. NYHA Class IV symptoms with near syncope prior to treatment, and now back to baseline. Home doses of toresemide 20 mg BID and metoloazone, has been replaced with lasix 40 mg IV BID with <1 liter urine output.  Can consider this on follow up with Dr. Debara Pickett. Most recent echo was completed in January of this year PAP moderate to severe range of 67 mmHg. Marland Kitchen   Last documented dry wt was 201, per Dr. Debara Pickett office note in Sept 2015. Admission wt appears to have  been 211 lbs, with most recent wt of 209. She appears compensated now and would change back to po torsemide but at BID dosing. Use metolazone prn.  Creatinine 1.51 this am up from admission of 1.3.   3. Pulmonary Hypertension: She is also followed by Dr. Halford Chessman, pulmonologist but has not seen him since August of 2007. Recommend post hospitalization follow up appt.  4. Tachycardia: Heart rate is essentially controlled in the setting of COPD exacerbation and use of steroid neb tx. Would continue coreg dose as is.    Signed: Phill Myron. Lawrence NP  06/24/2014, 11:41 AM Co-Sign MD  Patient seen and discussed with NP Lawerence, I agree with her documentation above. 79 yo female followed by Dr Debara Pickett with history of chronic right sided HF, pulmonary HTN, OSA intolerant to CPAP, chronic diastolic HF with multiple admissions with decompensation (dry weight 201 lbs). From notes she has had problems with overdiuresis and hypotension as outpatient. History of GI bleed while on aspirin and plavix, chronic hypoxia on home O2 secondary to obesity hypovent per notes. Presents with SOB, cough, wheezing, and nasal congestion. Also with increased orthopnea. Reports SOB worsened with onset of cold like symptoms.   - 2011 RHC/JHC  RA 8, mean PA 44, PCWP 8, LVEDP 11. CI 2.92 - LHC with patent coronaries - Jan 2015 LVEF 55-60%, could not evaluate diastolic dysfunction (prior echoes have shown grade I diastolic dysfunction), flattened ventricular septum, RV severely dilated with severely reduced function, - moderate TR, PASP 67. - K 5.0, Cr 1.51, GFR 32, WBC 5, Hgb 10, Plt 140, pro-BNP 3183, trop neg - ABG 7.4/34/57 on venturi mask - CXR no acute process  Patient presents with URI symptoms, along with evidence of acute on chronic right sided systolic heart failure/ left sided diastolic HF. She is followed for her pulm HTN as outpatient, [place] 2011 with transpulmonary gradient of 36 consistent with precapillary pulmonary  HTN likely related to her chronic hypoxia and pulmonary disease. No strong indication for vasodilators in setting of chronic lung disease induced pulmonary HTN, and risk for increased V/Q mismatch and worsening hypoxia. Primary focus in such types of pulm HTN is maximizing oxygenation and management of primary pulmonary disease.  She is on IV lasix and net negative nearly 1 liters since admission (weight 209, reported dry weight 209 lbs), renal function remains stable. Despite fairly modest elevation in Cr (Cr 1.5), her GFR is 32 consistent with stage III nearly stage IV CKD. Focus during this admission will be diuresis to improve symptoms, with close follow up with her primary cardiolgist for further longterm management of her severe advanced comorbidities.   She reports symptoms have improved since admission, nearly back to her baseline. Likely combination of URI along with fluid overload, URI may have been reason for HF exacerbation.  She has been changed back to oral torsemide, will change her to 40mg  in AM and 20mg  in PM, continue home metolazone. She will need close follow up with Dr Rod Mae office within 1 week, or if not available may follow up with [place] in [place] in 1 week. Anticipate likely discharge over the weekend.     Zandra Abts MD

## 2014-06-25 LAB — BASIC METABOLIC PANEL
ANION GAP: 14 (ref 5–15)
BUN: 52 mg/dL — ABNORMAL HIGH (ref 6–23)
CALCIUM: 9.7 mg/dL (ref 8.4–10.5)
CO2: 25 meq/L (ref 19–32)
Chloride: 96 mEq/L (ref 96–112)
Creatinine, Ser: 1.62 mg/dL — ABNORMAL HIGH (ref 0.50–1.10)
GFR calc Af Amer: 34 mL/min — ABNORMAL LOW (ref >90)
GFR, EST NON AFRICAN AMERICAN: 30 mL/min — AB (ref >90)
Glucose, Bld: 146 mg/dL — ABNORMAL HIGH (ref 70–99)
Potassium: 6 mEq/L — ABNORMAL HIGH (ref 3.7–5.3)
SODIUM: 135 meq/L — AB (ref 137–147)

## 2014-06-25 LAB — GLUCOSE, CAPILLARY
Glucose-Capillary: 156 mg/dL — ABNORMAL HIGH (ref 70–99)
Glucose-Capillary: 176 mg/dL — ABNORMAL HIGH (ref 70–99)
Glucose-Capillary: 128 mg/dL — ABNORMAL HIGH (ref 70–99)

## 2014-06-25 MED ORDER — DOXYCYCLINE HYCLATE 100 MG PO TABS: 100.0000 mg | ORAL_TABLET | Freq: Two times a day (BID) | ORAL | Status: AC

## 2014-06-25 MED ORDER — GLIPIZIDE 5 MG PO TABS
10.0000 mg | ORAL_TABLET | Freq: Two times a day (BID) | ORAL | Status: DC
  Administered 2014-06-25: 10 mg via ORAL
  Filled 2014-06-25: qty 2

## 2014-06-25 NOTE — Progress Notes (Signed)
Discharge teaching done with HF handout.  IV removed w/o difficulty and intact.  Pt took shower before leaving with help of daughters.  Pt taken via wheelchair to main entrance with belongings.  Pt stable and left with own O2 tank on 5l/min [place] at discharge.

## 2014-06-25 NOTE — Discharge Summary (Signed)
Physician Discharge Summary  [name] HCW:[id number] DOB: 03-15-1935 DOA: 06/22/2014  PCP: Rocky Morel, MD  Admit date: 06/22/2014 Discharge date: 06/25/2014  Time spent: 35 minutes  Recommendations for Outpatient Follow-up:  1. Recommend Bmet 5-7 days 2. Continue chronic O2 therapy 3. Needs further discussion about Pulm Htn grade 3 byeither advanced HF team or Pulmonology 4. Complete Abx for COPD flare. 5. Resume HH and chronic 5 liters on d/c   Discharge Diagnoses:  Active Problems:   Acute respiratory failure with hypoxia   AKI (acute kidney injury)   Alkalosis   Acute on chronic systolic right heart failure   Discharge Condition: stable  Diet recommendation: heart healthy  Filed Weights   06/23/14 0500 06/24/14 0300 06/25/14 0404  Weight: 96 kg (211 lb 10.3 oz) 95 kg (209 lb 7 oz) 94.9 kg (209 lb 3.5 oz)    History of present illness:  79 y/o ?, Right-sided heart failure severe PA pressure 74 mmHg On 5 L of oxygen at home/ cor pulmonale /OSA intolerant of CPAP last echo 01/12/13 EF 6570% date 1 DD , GI bleed 06/25/13 status post transfusion , sinus tachycardia , normal cardiac 2011 , super morbid obesity , her chronic splenomegaly , acute pancreatitis in the past who was admitted in the setting of having sick contacts with similar symptoms. Cough hypoxia nasal congestion. Found to have congestion on chest x-ray, proBNP 3183.  She actually had call the office with 5 pound increase She had a chest x-ray on 10/20 that was clear show any volume overload. BNP done at that time was 900.  She was admitted   Hospital Course:   1. Acute mixed respiratory failure-2/2 to Pulm Htn from decompensated diastolic HF-see below 2. Acute decompensated Severe Pulm Htn, group 3, Last PA Peak 67 mm hg 08/2013-difficult situation. Cannot aggressively diurese as RV is preload dependant. Appreciate Cardiology input-they have switched patient over to Demadex 40 am, 20 pm. Because  of increasing renal dysfucntion she was d.c on Demadex 20 bid~ -900 cc this hospital stay. Her dry weight is reportedly 215 lbs and she was d/c at a weight of 209.  She will need close f/u as OP.  I have asked gher to follow up with Dr. Debara Pickett ion the next week [she wishes to go to [place] weekend 11[place]9 and I have told her to be seen pror to that if she can]--Consider Advanced Heart failure referral or re-referral to Pulmonology 3. OSA/OHS-intolerant on CPAP-unclear what else can be done. 4. Unlikely COPD-never smoker-discontinue steroids as no wheeze nor role for the same. continue Doxycycline for sputum and URI symptoms. Place PRN order for Nebulization every 4 hours 5. DM ty 2 on orals-CBG's 242-294. Continue Linagliptin 5 mg daily. Worsened by steroids. Continue SSI for now. CBG's 139-159 6. Sinus tachycardia 2/2 to inhalers but has been noted in the past. Continue Coreg 3.125. Furthe rcontorl limited by low blood pressure 7. HLd-continue Pravachol 20 qpm 8. Hypothyroid-continue Levothyroxine 150 mcg q am. Get TSH in about 4 weeks   Consultants:  cardiology  Procedures:  Chest x-ray  Antibiotics:  none   Discharge Exam: Filed Vitals:   06/25/14 0404  BP: 128/62  Pulse: 73  Temp: 97.5 F (36.4 C)  Resp: 20   Alert pleasant comfortable Still a little SOb but improved.  Sptum seems to be clearing and less tanacious  General: alert pleasant oriented Cardiovascular:  s1 s 2no m/r/g Respiratory: clear  Discharge Instructions You were cared for  by a hospitalist during your hospital stay. If you have any questions about your discharge medications or the care you received while you were in the hospital after you are discharged, you can call the unit and asked to speak with the hospitalist on call if the hospitalist that took care of you is not available. Once you are discharged, your primary care physician will handle any further medical issues. Please note that NO REFILLS for any  discharge medications will be authorized once you are discharged, as it is imperative that you return to your primary care physician (or establish a relationship with a primary care physician if you do not have one) for your aftercare needs so that they can reassess your need for medications and monitor your lab values.   Current Discharge Medication List    CONTINUE these medications which have NOT CHANGED   Details  acetaminophen (TYLENOL) 500 MG tablet Take 500 mg by mouth every 6 (six) hours as needed for mild pain or moderate pain.    ALPRAZolam (XANAX) 0.5 MG tablet Take 0.5 mg by mouth 2 (two) times daily. May take up to 4 times daily as prescribed    baclofen (LIORESAL) 20 MG tablet Take 20 mg by mouth daily as needed. For pain/spasms    brimonidine (ALPHAGAN P) 0.1 % SOLN Place 1 drop into both eyes daily. BRAND ONLY    carvedilol (COREG) 3.125 MG tablet Take 1 tablet (3.125 mg total) by mouth 2 (two) times daily with a meal. Qty: 60 tablet, Refills: 6    docusate sodium (STOOL SOFTENER) 100 MG capsule Take 100 mg by mouth 2 (two) times daily.    ezetimibe (ZETIA) 10 MG tablet Take 10 mg by mouth at bedtime.      fexofenadine (ALLEGRA) 180 MG tablet Take 180 mg by mouth every morning.    glipiZIDE (GLUCOTROL) 10 MG tablet Take 10 mg by mouth 2 (two) times daily before a meal.      iron polysaccharides (NIFEREX) 150 MG capsule Take 1 capsule (150 mg total) by mouth daily. Qty: 90 capsule, Refills: 3    levothyroxine (SYNTHROID, LEVOTHROID) 150 MCG tablet Take 150 mcg by mouth daily before breakfast.     linagliptin (TRADJENTA) 5 MG TABS tablet Take 5 mg by mouth every morning.     metolazone (ZAROXOLYN) 2.5 MG tablet Take 2.5 mg by mouth every Monday, Wednesday, and Friday. Take 1 tablet by mouth on Mondays, Wednesdays, Friday. Take 30 minutes prior to AM dose of torsemide as needed for FLUID    Multiple Vitamin (MULTIVITAMIN WITH MINERALS) TABS tablet Take 1 tablet by  mouth daily.    oxyCODONE-acetaminophen (PERCOCET/ROXICET) 5-325 MG per tablet Take 1 tablet by mouth 2 (two) times daily. May take 1 additional dose if needed for pain    OXYGEN-HELIUM IN Inhale 4 L into the lungs continuous.    pantoprazole (PROTONIX) 40 MG tablet Take 40 mg by mouth 2 (two) times daily.    potassium chloride SA (K-DUR,KLOR-CON) 20 MEQ tablet Take 2 tablets (40 mEq total) by mouth 2 (two) times daily. Qty: 120 tablet, Refills: 2    pravastatin (PRAVACHOL) 20 MG tablet Take 20 mg by mouth every morning.     torsemide (DEMADEX) 20 MG tablet Take 20 mg by mouth 2 (two) times daily.    Travoprost, BAK Free, (TRAVATAN) 0.004 % SOLN ophthalmic solution Place 1 drop into both eyes at bedtime. *BRAND ONLY    polyethylene glycol (MIRALAX / GLYCOLAX) packet Take  17 g by mouth as needed for mild constipation or moderate constipation.        Allergies  Allergen Reactions  . Aspirin Other (See Comments)    GI bleed/ PUD  . Darvocet [Propoxyphene N-Acetaminophen] Nausea And Vomiting  . Diphenhydramine Hcl Other (See Comments)    Glaucoma prevents patient from taking this medication   . Aloe Vera Rash  . Penicillins Swelling and Rash      The results of significant diagnostics from this hospitalization (including imaging, microbiology, ancillary and laboratory) are listed below for reference.    Significant Diagnostic Studies: Dg Chest 2 View  06/22/2014   CLINICAL DATA:  Shortness of breath and weakness.  Headache  EXAM: CHEST  2 VIEW  COMPARISON:  06/15/2014  FINDINGS: There is mild cardiac enlargement. Calcified atherosclerotic disease involves the thoracic aorta. Both lungs are clear. The visualized skeletal structures are unremarkable.  IMPRESSION: No acute cardiopulmonary abnormalities.  Cardiac enlargement and atherosclerotic disease   Electronically Signed   By: Kerby Moors M.D.   On: 06/22/2014 20:11   Dg Chest 2 View  06/15/2014   CLINICAL DATA:  Short of  breath. Oxygen desaturation. History of diabetes, hypertension, COPD and CHF. History of a previous cardiac catheterization.  EXAM: CHEST  2 VIEW  COMPARISON:  01/19/2014.  FINDINGS: Cardiac silhouette is mildly enlarged. Normal mediastinal and hilar contours.  Stable area of linear scarring in the left mid to lower lung. No lung consolidation or edema. No pleural effusion or pneumothorax.  Bony thorax is demineralized.  IMPRESSION: No acute cardiopulmonary disease.   Electronically Signed   By: Lajean Manes M.D.   On: 06/15/2014 16:55    Microbiology: Recent Results (from the past 240 hour(s))  MRSA PCR SCREENING     Status: None   Collection Time    06/23/14 12:15 AM      Result Value Ref Range Status   MRSA by PCR NEGATIVE  NEGATIVE Final   Comment:            The GeneXpert MRSA Assay (FDA     approved for NASAL specimens     only), is one component of a     comprehensive MRSA colonization     surveillance program. It is not     intended to diagnose MRSA     infection nor to guide or     monitor treatment for     MRSA infections.     Labs: Basic Metabolic Panel:  Recent Labs Lab 06/22/14 2007 06/23/14 0436 06/24/14 0627 06/25/14 0845  NA 132* 137 137 135*  K 3.5* 3.4* 5.0 6.0*  CL 88* 93* 95* 96  CO2 26 25 30 25   GLUCOSE 214* 288* 142* 146*  BUN 42* 42* 47* 52*  CREATININE 1.82* 1.48* 1.51* 1.62*  CALCIUM 9.3 9.3 9.7 9.7   Liver Function Tests:  Recent Labs Lab 06/22/14 2007 06/23/14 0436 06/24/14 0627  AST 19 17 17   ALT 9 8 7   ALKPHOS 137* 129* 121*  BILITOT 0.7 0.8 0.7  PROT 6.9 6.7 6.6  ALBUMIN 3.9 3.8 3.8   No results found for this basename: LIPASE, AMYLASE,  in the last 168 hours No results found for this basename: AMMONIA,  in the last 168 hours CBC:  Recent Labs Lab 06/22/14 2007 06/23/14 0436  WBC 8.2 5.4  NEUTROABS 6.0 4.9  HGB 10.5* 10.0*  HCT 35.0* 34.2*  MCV 67.2* 67.1*  PLT 143* 140*   Cardiac Enzymes:  Recent  Labs Lab  06/22/14 2007  TROPONINI <0.30   BNP: BNP (last 3 results)  Recent Labs  09/06/13 1825 06/22/14 2007  PROBNP 2771.0* 3183.0*   CBG:  Recent Labs Lab 06/24/14 1647 06/24/14 2049 06/25/14 0009 06/25/14 0403 06/25/14 0733  GLUCAP 177* 198* 176* 156* 128*       Signed:  Nita Sells  [facility] 06/25/2014, 1:23 PM

## 2014-06-27 ENCOUNTER — Other Ambulatory Visit: Payer: Self-pay | Admitting: Internal Medicine

## 2014-06-27 NOTE — Telephone Encounter (Signed)
Rx was sent to pharmacy electronically. 

## 2014-06-30 ENCOUNTER — Ambulatory Visit (INDEPENDENT_AMBULATORY_CARE_PROVIDER_SITE_OTHER): Payer: PRIVATE HEALTH INSURANCE | Admitting: Cardiology

## 2014-06-30 ENCOUNTER — Encounter: Payer: Self-pay | Admitting: Cardiology

## 2014-06-30 VITALS — BP 110/69 | HR 81 | Ht 61.0 in | Wt 221.3 lb

## 2014-06-30 DIAGNOSIS — IMO0001 Reserved for inherently not codable concepts without codable children: Secondary | ICD-10-CM

## 2014-06-30 DIAGNOSIS — Z0389 Encounter for observation for other suspected diseases and conditions ruled out: Secondary | ICD-10-CM

## 2014-06-30 DIAGNOSIS — Z79899 Other long term (current) drug therapy: Secondary | ICD-10-CM

## 2014-06-30 DIAGNOSIS — E875 Hyperkalemia: Secondary | ICD-10-CM

## 2014-06-30 DIAGNOSIS — N179 Acute kidney failure, unspecified: Secondary | ICD-10-CM

## 2014-06-30 DIAGNOSIS — I5033 Acute on chronic diastolic (congestive) heart failure: Secondary | ICD-10-CM

## 2014-06-30 LAB — BASIC METABOLIC PANEL
BUN: 22 mg/dL (ref 6–23)
CO2: 22 mEq/L (ref 19–32)
Calcium: 9 mg/dL (ref 8.4–10.5)
Chloride: 104 mEq/L (ref 96–112)
Creat: 1.08 mg/dL (ref 0.50–1.10)
Glucose, Bld: 137 mg/dL — ABNORMAL HIGH (ref 70–99)
Potassium: 4.7 mEq/L (ref 3.5–5.3)
Sodium: 136 mEq/L (ref 135–145)

## 2014-06-30 NOTE — Assessment & Plan Note (Signed)
Unreliable wgts as she is unable to stand by herself- "169 lbs" at home 229 lbs here

## 2014-06-30 NOTE — Assessment & Plan Note (Signed)
SCr 1.6 

## 2014-06-30 NOTE — Assessment & Plan Note (Signed)
Her K+ was 6.0 on 06/24/14- discharged on K Dur 40 meq BID

## 2014-06-30 NOTE — Assessment & Plan Note (Signed)
Just discharged 06/24/14

## 2014-06-30 NOTE — Patient Instructions (Signed)
Your physician recommends that you schedule a follow-up appointment in: 2 Weeks with Dr Debara Pickett  Your physician has recommended you make the following change in your medication: INCREASE Demedex to 40 mg in the morning and 20 mg at night and DECREASE Potassium to 20 meq twice a day  Your physician recommends that you return for lab work in [facility]

## 2014-06-30 NOTE — Progress Notes (Signed)
06/30/2014 [name]   [birth year]  [id number]  Primary Physician Rocky Morel, MD Primary Cardiologist: Dr Debara Pickett  HPI:  79 y.o.female with multiple medical problems. From a cardiac standpoint she has a history of right heart failure, chronic respiratory failure on O2, morbid obesity, pulmonary hypertension, and multiple admission for diastolic CHF, (dry wt of 010). She also has a hx of GI bleed and was taken off of Plavix and ASA (no indication for this medication with normal coronaries per cath 2011. She was admitted 06/22/14 with respiratory failure. She was diuresed and discharged 06/25/14. In the office she thinks she is improved. She realy can't get around without assistance. Her wgts unfortunately are unreliable.    Current Outpatient Prescriptions  Medication Sig Dispense Refill  . acetaminophen (TYLENOL) 500 MG tablet Take 500 mg by mouth every 6 (six) hours as needed for mild pain or moderate pain.    Marland Kitchen ALPRAZolam (XANAX) 0.5 MG tablet Take 0.5 mg by mouth 2 (two) times daily. May take up to 4 times daily as prescribed    . baclofen (LIORESAL) 20 MG tablet Take 20 mg by mouth daily as needed. For pain/spasms    . brimonidine (ALPHAGAN P) 0.1 % SOLN Place 1 drop into both eyes daily. BRAND ONLY    . carvedilol (COREG) 3.125 MG tablet Take 1 tablet (3.125 mg total) by mouth 2 (two) times daily with a meal. 60 tablet 6  . docusate sodium (STOOL SOFTENER) 100 MG capsule Take 100 mg by mouth 2 (two) times daily.    Marland Kitchen doxycycline (VIBRA-TABS) 100 MG tablet Take 1 tablet (100 mg total) by mouth every 12 (twelve) hours. 4 tablet 0  . ezetimibe (ZETIA) 10 MG tablet Take 10 mg by mouth at bedtime.      . fexofenadine (ALLEGRA) 180 MG tablet Take 180 mg by mouth every morning.    Marland Kitchen glipiZIDE (GLUCOTROL) 10 MG tablet Take 10 mg by mouth 2 (two) times daily before a meal.      . IFEREX 150 150 MG capsule TAKE ONE CAPSULE BY MOUTH DAILY 90 capsule 1  . levothyroxine  (SYNTHROID, LEVOTHROID) 150 MCG tablet Take 150 mcg by mouth daily before breakfast.     . linagliptin (TRADJENTA) 5 MG TABS tablet Take 5 mg by mouth every morning.     . Multiple Vitamin (MULTIVITAMIN WITH MINERALS) TABS tablet Take 1 tablet by mouth daily.    Marland Kitchen oxyCODONE-acetaminophen (PERCOCET/ROXICET) 5-325 MG per tablet Take 1 tablet by mouth 2 (two) times daily. May take 1 additional dose if needed for pain    . OXYGEN-HELIUM IN Inhale 4 L into the lungs continuous.    . pantoprazole (PROTONIX) 40 MG tablet Take 40 mg by mouth 2 (two) times daily.    . polyethylene glycol (MIRALAX / GLYCOLAX) packet Take 17 g by mouth as needed for mild constipation or moderate constipation.     . potassium chloride SA (K-DUR,KLOR-CON) 20 MEQ tablet Take 2 tablets (40 mEq total) by mouth 2 (two) times daily. (Patient taking differently: Take 20 mEq by mouth 2 (two) times daily. ) 120 tablet 2  . pravastatin (PRAVACHOL) 20 MG tablet Take 20 mg by mouth every morning.     . torsemide (DEMADEX) 20 MG tablet Take 40 mg in morning and 20 mg at night    . Travoprost, BAK Free, (TRAVATAN) 0.004 % SOLN ophthalmic solution Place 1 drop into both eyes at bedtime. *BRAND ONLY  No current facility-administered medications for this visit.    Allergies  Allergen Reactions  . Aspirin Other (See Comments)    GI bleed/ PUD  . Darvocet [Propoxyphene N-Acetaminophen] Nausea And Vomiting  . Diphenhydramine Hcl Other (See Comments)    Glaucoma prevents patient from taking this medication   . Aloe Vera Rash  . Penicillins Swelling and Rash    History   Social History  . Marital Status: Widowed    Spouse Name: N/A    Number of Children: N/A  . Years of Education: N/A   Occupational History  . Not on file.   Social History Main Topics  . Smoking status: Never Smoker   . Smokeless tobacco: Current User    Types: Snuff  . Alcohol Use: No  . Drug Use: No  . Sexual Activity: Yes    Birth Control/  Protection: Post-menopausal   Other Topics Concern  . Not on file   Social History Narrative   8 th grade education   Previous job: farmer    Lives with brother   Denies cigarettes, etOH, drugs. Uses snuff since age 86      Review of Systems: General: negative for chills, fever, night sweats or weight changes.  Cardiovascular: negative for chest pain, dyspnea on exertion, edema, orthopnea, palpitations, paroxysmal nocturnal dyspnea or shortness of breath Dermatological: negative for rash Respiratory: negative for cough or wheezing Urologic: negative for hematuria Abdominal: negative for nausea, vomiting, diarrhea, bright red blood per rectum, melena, or hematemesis Neurologic: negative for visual changes, syncope, or dizziness All other systems reviewed and are otherwise negative except as noted above.    Blood pressure 110/69, pulse 81, height 5\' 1"  (1.549 m), weight 221 lb 4.8 oz (100.381 kg).  General appearance: alert, cooperative, no distress, morbidly obese, pale and on O2 Lungs: clear to auscultation bilaterally Heart: regular rate and rhythm Extremities: no edema   ASSESSMENT AND PLAN:   Acute on chronic diastolic heart failure Just discharged 06/24/14  Hyperkalemia Her K+ was 6.0 on 06/24/14- discharged on K Dur 40 meq BID  AKI (acute kidney injury) SCr 1.6  Normal coronary arteries at cath 2011 .  Obesity, morbid (more than 100 lbs over ideal weight or BMI > 40) Unreliable wgts as she is unable to stand by herself- "169 lbs" at home 229 lbs here   PLAN  I ordered a STAT BMP. I decreased her K+ to 20 meq BID and increased her Demadex to 40 mg in the am and 20 mg in the pm. She'll need to return in two weeks.   Sanela Evola KPA-C 06/30/2014 3:31 PM

## 2014-07-06 ENCOUNTER — Telehealth: Payer: Self-pay | Admitting: Internal Medicine

## 2014-07-06 NOTE — Telephone Encounter (Signed)
Increase torsemide to 60 mg BID and take metolazone 2.5 mg daily for the next 3 days. If not improved by Friday, will need to be seen in the office.  Dr. Lemmie Evens

## 2014-07-06 NOTE — Telephone Encounter (Signed)
Spoke with Alisa and provided medication changes >> she will communicate to patient. She will advise patient on how to check O2 sats at home. They will call on Friday with an update on weights, O2 sats, edema.

## 2014-07-06 NOTE — Telephone Encounter (Signed)
Spoke with Janalyn Shy, [facility] nurse. She states [name] called her this AM stating she was swollen up and can't breathe. Alisa saw patient this AM.   >> 1-2+ pitting edema bilaterally up calves >> O2 80% when she arrived & patient was lying down sleeping - Alisa encouraged sitting up and deep breathing - O2 increased to 82-83% >> lungs sound clear - not wet - which is [place] her her >> weight 223 lbs - per Alisa her "comfortable weight" is 201-207 lbs >> patient is taking torsemide 40 QAM and 20mg  QPM (was previously taking 60mg  BID) >> patient takes metolazone 2.5mg  MWF >> patient traveled over weekend and may have eating things she does not normally eat at home >> heart rate 79/rhythm regular   Sent to Dr. Debara Pickett to advise - OV 11/18

## 2014-07-08 ENCOUNTER — Telehealth: Payer: Self-pay | Admitting: Internal Medicine

## 2014-07-08 NOTE — Telephone Encounter (Signed)
Returned call to [name] with [facility].Dr.Hilty advised continue same doses.Advised BNP,BMP next week.Appointment scheduled with Dr.Hilty 2014/07/29 at [place] will do BNP,BMP 07/11/14.

## 2014-07-08 NOTE — Telephone Encounter (Signed)
Continue on those doses - will need BNP, BMP next week. Try to schedule an appointment with me on midlevel in the office next week.  Dr. Lemmie Evens

## 2014-07-08 NOTE — Telephone Encounter (Signed)
Received a call from [facility] case manager.She wanted to give Dr.Hilty a update on patient.She stated she saw patient this afternoon.Stated pt has been taking torsemide 60 mg twice a day and has took metolazone 2.5 mg 3 days this week.She continues to have 2+ pitting edema but has improved slightly.Sob on exertion.She is not sob when sitting in chair.Weight is down 1&1/2 lbs.B/P 100/60 pulse 88 to 93 bpm.Stated she wanted to ask Dr.Hilty if pt should continue current doses of diuretics,and she will continue to see patient daily until her next appointment.Message sent to Dr.Hilty for advice.

## 2014-07-09 ENCOUNTER — Inpatient Hospital Stay (HOSPITAL_COMMUNITY)
Admission: EM | Admit: 2014-07-09 | Discharge: 2014-07-26 | DRG: 291 | Disposition: E | Payer: PRIVATE HEALTH INSURANCE | Attending: Family Medicine | Admitting: Family Medicine

## 2014-07-09 ENCOUNTER — Emergency Department (HOSPITAL_COMMUNITY): Payer: PRIVATE HEALTH INSURANCE

## 2014-07-09 ENCOUNTER — Encounter (HOSPITAL_COMMUNITY): Payer: Self-pay

## 2014-07-09 DIAGNOSIS — E876 Hypokalemia: Secondary | ICD-10-CM | POA: Diagnosis present

## 2014-07-09 DIAGNOSIS — I509 Heart failure, unspecified: Secondary | ICD-10-CM | POA: Insufficient documentation

## 2014-07-09 DIAGNOSIS — J449 Chronic obstructive pulmonary disease, unspecified: Secondary | ICD-10-CM | POA: Diagnosis present

## 2014-07-09 DIAGNOSIS — Z823 Family history of stroke: Secondary | ICD-10-CM

## 2014-07-09 DIAGNOSIS — Z6841 Body Mass Index (BMI) 40.0 and over, adult: Secondary | ICD-10-CM | POA: Diagnosis not present

## 2014-07-09 DIAGNOSIS — J9690 Respiratory failure, unspecified, unspecified whether with hypoxia or hypercapnia: Secondary | ICD-10-CM

## 2014-07-09 DIAGNOSIS — F17220 Nicotine dependence, chewing tobacco, uncomplicated: Secondary | ICD-10-CM | POA: Diagnosis present

## 2014-07-09 DIAGNOSIS — J9611 Chronic respiratory failure with hypoxia: Secondary | ICD-10-CM | POA: Diagnosis present

## 2014-07-09 DIAGNOSIS — Z79899 Other long term (current) drug therapy: Secondary | ICD-10-CM | POA: Diagnosis not present

## 2014-07-09 DIAGNOSIS — I5043 Acute on chronic combined systolic (congestive) and diastolic (congestive) heart failure: Principal | ICD-10-CM | POA: Diagnosis present

## 2014-07-09 DIAGNOSIS — E039 Hypothyroidism, unspecified: Secondary | ICD-10-CM | POA: Diagnosis present

## 2014-07-09 DIAGNOSIS — T502X5A Adverse effect of carbonic-anhydrase inhibitors, benzothiadiazides and other diuretics, initial encounter: Secondary | ICD-10-CM | POA: Diagnosis present

## 2014-07-09 DIAGNOSIS — Z9981 Dependence on supplemental oxygen: Secondary | ICD-10-CM | POA: Diagnosis not present

## 2014-07-09 DIAGNOSIS — D638 Anemia in other chronic diseases classified elsewhere: Secondary | ICD-10-CM | POA: Diagnosis present

## 2014-07-09 DIAGNOSIS — I2781 Cor pulmonale (chronic): Secondary | ICD-10-CM | POA: Diagnosis present

## 2014-07-09 DIAGNOSIS — K219 Gastro-esophageal reflux disease without esophagitis: Secondary | ICD-10-CM | POA: Diagnosis present

## 2014-07-09 DIAGNOSIS — G4733 Obstructive sleep apnea (adult) (pediatric): Secondary | ICD-10-CM | POA: Diagnosis present

## 2014-07-09 DIAGNOSIS — Z79891 Long term (current) use of opiate analgesic: Secondary | ICD-10-CM | POA: Diagnosis not present

## 2014-07-09 DIAGNOSIS — I129 Hypertensive chronic kidney disease with stage 1 through stage 4 chronic kidney disease, or unspecified chronic kidney disease: Secondary | ICD-10-CM | POA: Diagnosis present

## 2014-07-09 DIAGNOSIS — I469 Cardiac arrest, cause unspecified: Secondary | ICD-10-CM | POA: Diagnosis not present

## 2014-07-09 DIAGNOSIS — I272 Other secondary pulmonary hypertension: Secondary | ICD-10-CM | POA: Diagnosis present

## 2014-07-09 DIAGNOSIS — N179 Acute kidney failure, unspecified: Secondary | ICD-10-CM | POA: Diagnosis present

## 2014-07-09 DIAGNOSIS — R0602 Shortness of breath: Secondary | ICD-10-CM

## 2014-07-09 DIAGNOSIS — I50813 Acute on chronic right heart failure: Secondary | ICD-10-CM | POA: Diagnosis present

## 2014-07-09 DIAGNOSIS — Z515 Encounter for palliative care: Secondary | ICD-10-CM

## 2014-07-09 DIAGNOSIS — Z8249 Family history of ischemic heart disease and other diseases of the circulatory system: Secondary | ICD-10-CM

## 2014-07-09 DIAGNOSIS — I248 Other forms of acute ischemic heart disease: Secondary | ICD-10-CM | POA: Diagnosis present

## 2014-07-09 DIAGNOSIS — R001 Bradycardia, unspecified: Secondary | ICD-10-CM | POA: Diagnosis not present

## 2014-07-09 DIAGNOSIS — I50811 Acute right heart failure: Secondary | ICD-10-CM | POA: Insufficient documentation

## 2014-07-09 DIAGNOSIS — J9601 Acute respiratory failure with hypoxia: Secondary | ICD-10-CM

## 2014-07-09 DIAGNOSIS — Z833 Family history of diabetes mellitus: Secondary | ICD-10-CM | POA: Diagnosis not present

## 2014-07-09 DIAGNOSIS — J9621 Acute and chronic respiratory failure with hypoxia: Secondary | ICD-10-CM | POA: Diagnosis present

## 2014-07-09 DIAGNOSIS — E119 Type 2 diabetes mellitus without complications: Secondary | ICD-10-CM | POA: Diagnosis present

## 2014-07-09 DIAGNOSIS — I5023 Acute on chronic systolic (congestive) heart failure: Secondary | ICD-10-CM | POA: Diagnosis present

## 2014-07-09 DIAGNOSIS — N183 Chronic kidney disease, stage 3 (moderate): Secondary | ICD-10-CM | POA: Diagnosis present

## 2014-07-09 DIAGNOSIS — H409 Unspecified glaucoma: Secondary | ICD-10-CM | POA: Diagnosis present

## 2014-07-09 DIAGNOSIS — I5033 Acute on chronic diastolic (congestive) heart failure: Secondary | ICD-10-CM

## 2014-07-09 DIAGNOSIS — I270 Primary pulmonary hypertension: Secondary | ICD-10-CM

## 2014-07-09 DIAGNOSIS — Z807 Family history of other malignant neoplasms of lymphoid, hematopoietic and related tissues: Secondary | ICD-10-CM

## 2014-07-09 LAB — COMPREHENSIVE METABOLIC PANEL
ALK PHOS: 121 U/L — AB (ref 39–117)
ALT: 9 U/L (ref 0–35)
AST: 20 U/L (ref 0–37)
Albumin: 3.7 g/dL (ref 3.5–5.2)
Anion gap: 20 — ABNORMAL HIGH (ref 5–15)
BILIRUBIN TOTAL: 1 mg/dL (ref 0.3–1.2)
BUN: 35 mg/dL — AB (ref 6–23)
CHLORIDE: 92 meq/L — AB (ref 96–112)
CO2: 23 meq/L (ref 19–32)
CREATININE: 1.67 mg/dL — AB (ref 0.50–1.10)
Calcium: 8.9 mg/dL (ref 8.4–10.5)
GFR calc Af Amer: 33 mL/min — ABNORMAL LOW (ref >90)
GFR, EST NON AFRICAN AMERICAN: 28 mL/min — AB (ref >90)
Glucose, Bld: 251 mg/dL — ABNORMAL HIGH (ref 70–99)
POTASSIUM: 3.1 meq/L — AB (ref 3.7–5.3)
Sodium: 135 mEq/L — ABNORMAL LOW (ref 137–147)
Total Protein: 6.3 g/dL (ref 6.0–8.3)

## 2014-07-09 LAB — CBC WITH DIFFERENTIAL/PLATELET
BASOS ABS: 0.1 10*3/uL (ref 0.0–0.1)
BASOS PCT: 1 % (ref 0–1)
EOS ABS: 0.1 10*3/uL (ref 0.0–0.7)
Eosinophils Relative: 1 % (ref 0–5)
HCT: 35.8 % — ABNORMAL LOW (ref 36.0–46.0)
Hemoglobin: 10.2 g/dL — ABNORMAL LOW (ref 12.0–15.0)
Lymphocytes Relative: 11 % — ABNORMAL LOW (ref 12–46)
Lymphs Abs: 0.8 10*3/uL (ref 0.7–4.0)
MCH: 19.2 pg — AB (ref 26.0–34.0)
MCHC: 28.5 g/dL — AB (ref 30.0–36.0)
MCV: 67.5 fL — ABNORMAL LOW (ref 78.0–100.0)
Monocytes Absolute: 1.2 10*3/uL — ABNORMAL HIGH (ref 0.1–1.0)
Monocytes Relative: 16 % — ABNORMAL HIGH (ref 3–12)
NEUTROS PCT: 72 % (ref 43–77)
Neutro Abs: 5.4 10*3/uL (ref 1.7–7.7)
Platelets: 151 10*3/uL (ref 150–400)
RBC: 5.3 MIL/uL — ABNORMAL HIGH (ref 3.87–5.11)
RDW: 18.4 % — AB (ref 11.5–15.5)
WBC: 7.5 10*3/uL (ref 4.0–10.5)

## 2014-07-09 LAB — BLOOD GAS, ARTERIAL
Acid-base deficit: 1.5 mmol/L (ref 0.0–2.0)
BICARBONATE: 22.6 meq/L (ref 20.0–24.0)
FIO2: 100 %
O2 Saturation: 89.4 %
PATIENT TEMPERATURE: 37
PO2 ART: 62.9 mmHg — AB (ref 80.0–100.0)
TCO2: 21.1 mmol/L (ref 0–100)
pCO2 arterial: 37.6 mmHg (ref 35.0–45.0)
pH, Arterial: 7.397 (ref 7.350–7.450)

## 2014-07-09 LAB — PRO B NATRIURETIC PEPTIDE: Pro B Natriuretic peptide (BNP): 7457 pg/mL — ABNORMAL HIGH (ref 0–450)

## 2014-07-09 LAB — GLUCOSE, CAPILLARY: Glucose-Capillary: 242 mg/dL — ABNORMAL HIGH (ref 70–99)

## 2014-07-09 LAB — TROPONIN I: Troponin I: <0.3 ng/mL (ref <0.30)

## 2014-07-09 MED ORDER — BRIMONIDINE TARTRATE 0.15 % OP SOLN
1.0000 [drp] | Freq: Every day | OPHTHALMIC | Status: DC
  Administered 2014-07-10 – 2014-07-11 (×2): 1 [drp] via OPHTHALMIC
  Filled 2014-07-09: qty 5

## 2014-07-09 MED ORDER — CETYLPYRIDINIUM CHLORIDE 0.05 % MT LIQD
7.0000 mL | Freq: Two times a day (BID) | OROMUCOSAL | Status: DC
  Administered 2014-07-09 – 2014-07-11 (×4): 7 mL via OROMUCOSAL

## 2014-07-09 MED ORDER — IPRATROPIUM-ALBUTEROL 0.5-2.5 (3) MG/3ML IN SOLN
3.0000 mL | Freq: Once | RESPIRATORY_TRACT | Status: AC
  Administered 2014-07-09: 3 mL via RESPIRATORY_TRACT
  Filled 2014-07-09: qty 3

## 2014-07-09 MED ORDER — FUROSEMIDE 10 MG/ML IJ SOLN
40.0000 mg | Freq: Once | INTRAMUSCULAR | Status: AC
  Administered 2014-07-09: 40 mg via INTRAVENOUS
  Filled 2014-07-09: qty 4

## 2014-07-09 MED ORDER — SODIUM CHLORIDE 0.9 % IJ SOLN
3.0000 mL | Freq: Two times a day (BID) | INTRAMUSCULAR | Status: DC
  Administered 2014-07-09 – 2014-07-11 (×5): 3 mL via INTRAVENOUS

## 2014-07-09 MED ORDER — SODIUM CHLORIDE 0.9 % IJ SOLN: 3.0000 mL | INTRAMUSCULAR | Status: DC | PRN

## 2014-07-09 MED ORDER — FUROSEMIDE 10 MG/ML IJ SOLN
40.0000 mg | Freq: Two times a day (BID) | INTRAMUSCULAR | Status: AC
  Administered 2014-07-09 – 2014-07-10 (×2): 40 mg via INTRAVENOUS
  Filled 2014-07-09 (×2): qty 4

## 2014-07-09 MED ORDER — INSULIN ASPART 100 UNIT/ML ~~LOC~~ SOLN
0.0000 [IU] | Freq: Four times a day (QID) | SUBCUTANEOUS | Status: DC
  Administered 2014-07-09: 3 [IU] via SUBCUTANEOUS
  Administered 2014-07-10: 2 [IU] via SUBCUTANEOUS
  Administered 2014-07-10: 3 [IU] via SUBCUTANEOUS

## 2014-07-09 MED ORDER — ACETAMINOPHEN 325 MG PO TABS
650.0000 mg | ORAL_TABLET | ORAL | Status: DC | PRN
  Administered 2014-07-10 (×2): 650 mg via ORAL
  Filled 2014-07-09 (×2): qty 2

## 2014-07-09 MED ORDER — ONDANSETRON HCL 4 MG/2ML IJ SOLN: 4.0000 mg | Freq: Four times a day (QID) | INTRAMUSCULAR | Status: DC | PRN

## 2014-07-09 MED ORDER — SODIUM CHLORIDE 0.9 % IV SOLN: 250.0000 mL | INTRAVENOUS | Status: DC | PRN

## 2014-07-09 MED ORDER — LORAZEPAM 0.5 MG PO TABS
0.5000 mg | ORAL_TABLET | Freq: Once | ORAL | Status: AC
  Administered 2014-07-09: 0.5 mg via ORAL
  Filled 2014-07-09: qty 1

## 2014-07-09 MED ORDER — LEVOTHYROXINE SODIUM 75 MCG PO TABS
150.0000 ug | ORAL_TABLET | Freq: Every day | ORAL | Status: DC
  Administered 2014-07-10 – 2014-07-11 (×2): 150 ug via ORAL
  Filled 2014-07-09 (×2): qty 2

## 2014-07-09 MED ORDER — CHLORHEXIDINE GLUCONATE 0.12 % MT SOLN
15.0000 mL | Freq: Two times a day (BID) | OROMUCOSAL | Status: DC
  Administered 2014-07-09 – 2014-07-11 (×5): 15 mL via OROMUCOSAL
  Filled 2014-07-09 (×5): qty 15

## 2014-07-09 MED ORDER — LORAZEPAM 2 MG/ML IJ SOLN
0.5000 mg | INTRAMUSCULAR | Status: DC | PRN
  Administered 2014-07-09 – 2014-07-11 (×3): 0.5 mg via INTRAVENOUS
  Filled 2014-07-09 (×3): qty 1

## 2014-07-09 MED ORDER — HEPARIN SODIUM (PORCINE) 5000 UNIT/ML IJ SOLN
5000.0000 [IU] | Freq: Three times a day (TID) | INTRAMUSCULAR | Status: DC
  Administered 2014-07-09 – 2014-07-11 (×7): 5000 [IU] via SUBCUTANEOUS
  Filled 2014-07-09 (×7): qty 1

## 2014-07-09 MED ORDER — EZETIMIBE 10 MG PO TABS
10.0000 mg | ORAL_TABLET | Freq: Every day | ORAL | Status: DC
  Administered 2014-07-09 – 2014-07-11 (×3): 10 mg via ORAL
  Filled 2014-07-09 (×3): qty 1

## 2014-07-09 MED ORDER — METHYLPREDNISOLONE SODIUM SUCC 125 MG IJ SOLR
125.0000 mg | Freq: Once | INTRAMUSCULAR | Status: AC
Start: 2014-07-09 — End: 2014-07-09
  Administered 2014-07-09: 125 mg via INTRAVENOUS
  Filled 2014-07-09: qty 2

## 2014-07-09 MED ORDER — CARVEDILOL 3.125 MG PO TABS
3.1250 mg | ORAL_TABLET | Freq: Two times a day (BID) | ORAL | Status: DC
  Administered 2014-07-10 – 2014-07-11 (×3): 3.125 mg via ORAL
  Filled 2014-07-09 (×3): qty 1

## 2014-07-09 MED ORDER — ALBUTEROL SULFATE (2.5 MG/3ML) 0.083% IN NEBU
2.5000 mg | INHALATION_SOLUTION | Freq: Once | RESPIRATORY_TRACT | Status: AC
  Administered 2014-07-09: 2.5 mg via RESPIRATORY_TRACT
  Filled 2014-07-09: qty 3

## 2014-07-09 MED ORDER — IOHEXOL 350 MG/ML SOLN
100.0000 mL | Freq: Once | INTRAVENOUS | Status: AC | PRN
  Administered 2014-07-09: 100 mL via INTRAVENOUS

## 2014-07-09 MED ORDER — POTASSIUM CHLORIDE CRYS ER 20 MEQ PO TBCR
40.0000 meq | EXTENDED_RELEASE_TABLET | Freq: Two times a day (BID) | ORAL | Status: AC
  Administered 2014-07-09 – 2014-07-10 (×2): 40 meq via ORAL
  Filled 2014-07-09 (×2): qty 2

## 2014-07-09 MED ORDER — PANTOPRAZOLE SODIUM 40 MG PO TBEC
40.0000 mg | DELAYED_RELEASE_TABLET | Freq: Two times a day (BID) | ORAL | Status: DC
  Administered 2014-07-09 – 2014-07-11 (×5): 40 mg via ORAL
  Filled 2014-07-09 (×5): qty 1

## 2014-07-09 MED ORDER — LATANOPROST 0.005 % OP SOLN
1.0000 [drp] | Freq: Every day | OPHTHALMIC | Status: DC
  Administered 2014-07-10 – 2014-07-11 (×2): 1 [drp] via OPHTHALMIC
  Filled 2014-07-09: qty 2.5

## 2014-07-09 NOTE — Plan of Care (Signed)
Problem: Phase I Progression Outcomes Goal: Dyspnea controlled at rest (HF) Outcome: Completed/Met Date Met:  07/20/2014 Goal: Pain controlled with appropriate interventions Outcome: Completed/Met Date Met:  07/20/2014 Goal: Initial discharge plan identified Outcome: Completed/Met Date Met:  07/07/2014 Goal: Voiding-avoid urinary catheter unless indicated Outcome: Completed/Met Date Met:  07/02/2014 Goal: Hemodynamically stable Outcome: Completed/Met Date Met:  07/15/2014

## 2014-07-09 NOTE — ED Notes (Signed)
[name] at bedside.

## 2014-07-09 NOTE — H&P (Signed)
History and Physical  [name] IHK:[id number] DOB: [birth year] DOA: 07/04/2014  Referring physician: Dr. Roderic Palau in ED PCP: Rocky Morel, MD   Chief Complaint: short of breath, legs swelling  HPI:  79 year old woman with history of severe right-sided heart failure, moderate to severe pulmonary hypertension, diastolic heart failure and chronic hypoxic respiratory failure who presented with history of increasing lower extremity edema, orthopnea, hypoxia and difficulty breathing. In the emergency room found to be hypoxic on usual oxygen, placed on BiPAP and admitted for treatment for heart failure.  Chart reviewed in detail, last discharge October 31 with acute decompensated heart failure. Torsemide dosage was decreased at that time because of renal dysfunction. She was subsequently seen by her outpatient cardiology team and Demadex was increased 11/5 and 11/11; however she continued to have increasing lower extremity edema, shortness of breath and orthopnea and so came to the emergency department. Chart suggests her dry weight may be approximately 201-207 pounds (210 pounds September 15), current weight 222 pounds.  In the emergency department treated with bronchodilators, Lasix, Solu-Medrol, Ativan and BiPAP. Afebrile with stable hemodynamics but hypoxic on usual oxygen 4-5 liters. ABG 7.39/37/62.potassium 3.1. BUN and creatinine appear near baseline. Glucose was 251. Troponin negative. BNP 7457. Hemoglobin stable at 10.2. CT angiogram the chest negative for PE. Changes consistent with right-sided heart failure seen. EKG poor quality sinus rhythm with RVH, no acute changes compared to last study October 31  Review of Systems:  Negative for fever, visual changes, sore throat, rash, new muscle aches, chest pain, dysuria, bleeding, n/v/abdominal pain.  Past Medical History  Diagnosis Date  . Diabetes mellitus   . Hypertension   . Shingles   . GERD (gastroesophageal reflux  disease)   . Abnormal heart rhythms   . Thyroid disease     hypthyroidism  . Pulmonary hypertension, moderate to severe     No evidence of CAD on Cath 2011  . Obesity, morbid (more than 100 lbs over ideal weight or BMI > 40)   . Chronic respiratory failure with hypoxia 01/23/2012    Related to OHS  . Glaucoma   . CHF (congestive heart failure)     Echo at [facility] 10/14/2012 see notes  . COPD (chronic obstructive pulmonary disease)   . Acute on chronic renal failure     Past Surgical History  Procedure Laterality Date  . Tubal ligation    . Ovary removed    . Knee arthroscopy      left  . Cardiovascular stress test  09/15/2006    EF 81%; normal perfusion all regions; LV normal in size; no scintigraphic evidence of inducible myocardial ischemia  . Esophagogastroduodenoscopy N/A 06/25/2013    Procedure: ESOPHAGOGASTRODUODENOSCOPY (EGD);  Surgeon: Cleotis Nipper, MD;  Location: [facility] ENDOSCOPY;  Service: Endoscopy;  Laterality: N/A;  pediatric upper endoscope; no sedation  . Cardiac catheterization  02/21/2010    EF 65%; PA pressure 86/21 with mean of 44; normal coronary arteries, primary pumonary hypertension; no significant wall motion abnormalities    Social History:  reports that she has never smoked. Her smokeless tobacco use includes Snuff. She reports that she does not drink alcohol or use illicit drugs.  Allergies  Allergen Reactions  . Aspirin Other (See Comments)    GI bleed/ PUD  . Darvocet [Propoxyphene N-Acetaminophen] Nausea And Vomiting  . Diphenhydramine Hcl Other (See Comments)    Glaucoma prevents patient from taking this medication   . Aloe Vera Rash  . Penicillins Swelling and  Rash    Family History  Problem Relation Age of Onset  . Heart attack Father 36  . Heart disease Mother 7  . Diabetes Mother   . Lymphoma Daughter   . Diabetes      siblings, son  . Coronary artery disease Brother   . Diabetes Brother   . Stroke Brother   . Diabetes Brother   .  Diabetes Sister   . Hypertension Sister   . Diabetes Sister   . Heart disease Sister   . Hypertension Child   . Sleep apnea Child   . Hypertension Child   . Hypertension Child   . Arthritis/Rheumatoid Child   . Hyperlipidemia Child   . Hypertension Child      Prior to Admission medications   Medication Sig Start Date End Date Taking? Authorizing Provider  acetaminophen (TYLENOL) 500 MG tablet Take 500 mg by mouth every 6 (six) hours as needed for mild pain or moderate pain.   Yes Historical Provider, MD  ALPRAZolam Duanne Moron) 0.5 MG tablet Take 0.5 mg by mouth 2 (two) times daily.    Yes Historical Provider, MD  baclofen (LIORESAL) 20 MG tablet Take 20 mg by mouth daily as needed for muscle spasms.  06/18/14  Yes Historical Provider, MD  brimonidine (ALPHAGAN P) 0.1 % SOLN Place 1 drop into both eyes daily. BRAND ONLY   Yes Historical Provider, MD  carvedilol (COREG) 3.125 MG tablet Take 1 tablet (3.125 mg total) by mouth 2 (two) times daily with a meal. 05/03/14  Yes Pixie Casino, MD  docusate sodium (STOOL SOFTENER) 100 MG capsule Take 100 mg by mouth 2 (two) times daily.   Yes Historical Provider, MD  ezetimibe (ZETIA) 10 MG tablet Take 10 mg by mouth at bedtime.     Yes Historical Provider, MD  fexofenadine (ALLEGRA) 180 MG tablet Take 180 mg by mouth every morning.   Yes Historical Provider, MD  glipiZIDE (GLUCOTROL) 10 MG tablet Take 10 mg by mouth 2 (two) times daily before a meal.     Yes Historical Provider, MD  iron polysaccharides (NIFEREX) 150 MG capsule Take 150 mg by mouth daily.   Yes Historical Provider, MD  levothyroxine (SYNTHROID, LEVOTHROID) 150 MCG tablet Take 150 mcg by mouth daily before breakfast.    Yes Historical Provider, MD  linagliptin (TRADJENTA) 5 MG TABS tablet Take 5 mg by mouth every morning.    Yes Historical Provider, MD  metolazone (ZAROXOLYN) 2.5 MG tablet Take 1 tablet by mouth every Monday, Wednesday, and Friday. 06/27/14  Yes Historical Provider, MD    Multiple Vitamin (MULTIVITAMIN WITH MINERALS) TABS tablet Take 1 tablet by mouth daily.   Yes Historical Provider, MD  oxyCODONE-acetaminophen (PERCOCET/ROXICET) 5-325 MG per tablet Take 1 tablet by mouth 2 (two) times daily. May take 1 additional dose if needed for pain   Yes Historical Provider, MD  pantoprazole (PROTONIX) 40 MG tablet Take 40 mg by mouth 2 (two) times daily.   Yes Historical Provider, MD  polyethylene glycol (MIRALAX / GLYCOLAX) packet Take 17 g by mouth as needed for mild constipation or moderate constipation.    Yes Historical Provider, MD  potassium chloride SA (K-DUR,KLOR-CON) 20 MEQ tablet Take 2 tablets (40 mEq total) by mouth 2 (two) times daily. Patient taking differently: Take 20 mEq by mouth 2 (two) times daily.  09/16/13  Yes Brett Canales, PA-C  pravastatin (PRAVACHOL) 20 MG tablet Take 20 mg by mouth every morning.    Yes Historical  Provider, MD  torsemide (DEMADEX) 20 MG tablet Take 20-40 mg by mouth 2 (two) times daily. Take 40 mg in morning and 20 mg at night   Yes Historical Provider, MD  Travoprost, BAK Free, (TRAVATAN) 0.004 % SOLN ophthalmic solution Place 1 drop into both eyes at bedtime. *BRAND ONLY   Yes Historical Provider, MD  doxycycline (VIBRA-TABS) 100 MG tablet Take 1 tablet (100 mg total) by mouth every 12 (twelve) hours. Patient not taking: Reported on 07/06/2014 06/25/14   Nita Sells, MD  IFEREX 150 150 MG capsule TAKE ONE CAPSULE BY MOUTH DAILY Patient not taking: Reported on 07/05/2014 06/27/14   Pixie Casino, MD  OXYGEN-HELIUM IN Inhale 4 L into the lungs continuous.    Historical Provider, MD   Physical Exam: Filed Vitals:   07/11/2014 1430 07/15/2014 1456 07/17/2014 1500 07/20/2014 1530  BP: 109/59 106/56 108/88 111/62  Pulse: 82 93 83 86  Temp:  97.6 F (36.4 C)    TempSrc:  Oral    Resp:  20 21 17   Height:      Weight:      SpO2: 90% 91% 91% 95%    General: examined in the emergency department. Appears calm and comfortable,  currently on BiPAP with oxygen saturation in the mid 90s. Eyes: PERRL, normal lids, irises  ENT: grossly normal hearing, lips & tongue Neck: no LAD, masses or thyromegaly Cardiovascular: RRR, no m/r/g. 3+ bilateral lower extremity edema. Respiratory: diminished breath sounds bilaterally. No frank wheezes, rales or rhonchi. Mild increased respiratory effort. Able to speak in full sentences when BiPAP was briefly removed. Abdomen: obese, soft, nontender, nondistended. Some bruising to the abdomen presumably from previous hospitalization medication administration Skin: no rash or induration seen Musculoskeletal: grossly normal tone BUE/BLE Psychiatric: grossly normal mood and affect, speech fluent and appropriate Neurologic: grossly non-focal.  Wt Readings from Last 3 Encounters:  07/20/2014 100.699 kg (222 lb)  06/30/14 100.381 kg (221 lb 4.8 oz)  06/25/14 94.9 kg (209 lb 3.5 oz)    Labs on Admission:  Basic Metabolic Panel:  Recent Labs Lab 07/02/2014 1159  NA 135*  K 3.1*  CL 92*  CO2 23  GLUCOSE 251*  BUN 35*  CREATININE 1.67*  CALCIUM 8.9    Liver Function Tests:  Recent Labs Lab 06/28/2014 1159  AST 20  ALT 9  ALKPHOS 121*  BILITOT 1.0  PROT 6.3  ALBUMIN 3.7    CBC:  Recent Labs Lab 07/16/2014 1159  WBC 7.5  NEUTROABS 5.4  HGB 10.2*  HCT 35.8*  MCV 67.5*  PLT 151    Cardiac Enzymes:  Recent Labs Lab 07/20/2014 [place] <0.30     Recent Labs  09/06/13 1825 06/22/14 2007 06/26/2014 1159  PROBNP 2771.0* 3183.0* 7457.0*    Radiological Exams on Admission: Ct Angio Chest Pe W/cm &/or Wo Cm  07/21/2014   CLINICAL DATA:  Worsening shortness of Breath  EXAM: CT ANGIOGRAPHY CHEST WITH CONTRAST  TECHNIQUE: Multidetector CT imaging of the chest was performed using the standard protocol during bolus administration of intravenous contrast. Multiplanar CT image reconstructions and MIPs were obtained to evaluate the vascular anatomy.  CONTRAST:  180mL  OMNIPAQUE IOHEXOL 350 MG/ML SOLN  COMPARISON:  None.  FINDINGS: Bilateral pleural effusions left greater than right are noted. Left lower lobe atelectasis is noted. No focal confluent infiltrate is seen. The thoracic aorta shows calcific changes without aneurysmal dilatation or aortic dissection. Moderate coronary calcifications are seen. The pulmonary artery shows a normal  branching pattern without evidence of intraluminal filling defect to suggest pulmonary embolus. No hilar or mediastinal adenopathy is noted.  Enlargement of right ventricle and right atrium are noted consistent with a degree of right heart failure.  The upper abdomen demonstrates mild ascites. No other focal abnormality is noted.  Review of the MIP images confirms the above findings.  IMPRESSION: No evidence of pulmonary emboli.  Bilateral pleural effusions are seen.  Mild ascites.  Changes consistent with right heart failure an increased central venous pressure.   Electronically Signed   By: Inez Catalina M.D.   On: 07/08/2014 14:22   Dg Chest Portable 1 View  07/23/2014   CLINICAL DATA:  Worsening shortness of Breath, headache, history of COPD  EXAM: PORTABLE CHEST - 1 VIEW  COMPARISON:  06/14/2014  FINDINGS: Cardiomegaly. No pulmonary edema. There is small left pleural effusion with left basilar atelectasis or infiltrate. Right lung is clear.  IMPRESSION: No pulmonary edema. Small left pleural effusion. Left basilar atelectasis or infiltrate. Cardiomegaly.   Electronically Signed   By: Lahoma Crocker M.D.   On: 06/29/2014 13:00     Principal Problem:   Acute on chronic systolic right heart failure Active Problems:   Chronic respiratory failure with hypoxia   DM2 (diabetes mellitus, type 2)   Obesity, morbid (more than 100 lbs over ideal weight or BMI > 40)   Pulmonary hypertension, Severe by Echo 02/1713-pa pressure 74 mmHg   Hypokalemia   Acute on chronic diastolic heart failure   Acute respiratory failure with  hypoxia   Assessment/Plan 1. Acute on chronic hypoxic respiratory failure secondary to acute decompensated heart failure. There may be component of dietary indiscretion superimposed on developing edema over the last 2 weeks. No history of chest pain or evidence to suggest ACS. 2. Acute on chronic heart failure, multifactorial, diastolic and cor pulmonale with severe right-sided heart failure and moderate to severe pulmonary hypertension. 3. Chronic hypoxic respiratory failure on 4 L nasal cannula at baseline. 4. Chronic kidney disease stage III very close to baseline. 5. Hypokalemia. Likely secondary diuretics. 6. Anemia of chronic disease, stable. 7. Diabetes mellitus. Stable. 8. Morbid obesity.   She appears relatively well on BiPAP in oxygen saturations are in the mid 90s although would characterize her as critically ill based on her comorbidities and hypoxia on presentation. Plan admission to stepdown unit, BiPAP, judicious diuresis given her right-sided heart failure. She has clear evidence of volume overload. No indication for intubation at this time.  Serial troponin but ACS is doubted.  Although she has a history of exposure to secondhand smoke she has no history of COPD or personal smoking. No steroids at this time.  Repeat CBC and basic metabolic panel in the morning.  Replete potassium.  Sliding scale insulin.  Nutrition consultation  Recommend weaning off Xanax as an outpatient  Code Status: full code  DVT prophylaxis: Lovenox Family Communication: discussed in detail with 2 daughters at bedside. Disposition Plan/Anticipated LOS: admit, 3-4 days   Time spent: 70 minutes  Murray Hodgkins, MD  [facility] Pager [phone] 07/11/2014, 4:07 PM

## 2014-07-09 NOTE — ED Notes (Signed)
Dr. Sarajane Jews in with pt.

## 2014-07-09 NOTE — ED Provider Notes (Signed)
CSN: [id number]     Arrival date & time 06/26/2014  1125 History  This chart was scribed for Kari Diego, MD by Stephania Fragmin, ED Scribe. This patient was seen in room APA06/APA06 and the patient's care was started at 11:54 AM.    Chief Complaint  Patient presents with  . Shortness of Breath   Patient is a 79 y.o. female presenting with shortness of breath. The history is provided by the patient, a relative and medical records. No language interpreter was used.  Shortness of Breath Severity:  Moderate Onset quality:  Sudden (Suddenly worse this morning) Timing:  Constant Progression:  Worsening Chronicity:  Chronic Relieved by:  None tried Worsened by:  Nothing tried Ineffective treatments:  None tried Associated symptoms: headaches   Associated symptoms: no abdominal pain, no chest pain and no rash   Risk factors: tobacco use (Per records, pt uses snuff)      HPI Comments: [name] is a 79 y.o. female with COPD who presents to the Emergency Department complaining of constant, worsening SOB that began after being in the hospital 2 weeks ago.  It became worse this morning and she felt as though she might pass out. She complains of associated pedal edema that has been reduced, as well as a headache. Daughter states that she normally uses 4L/min O2 at home, but recently increased it to 5 L/min O2. Per daughter, she had recently gone to [facility] for vacation.  Past Medical History  Diagnosis Date  . Diabetes mellitus   . Hypertension   . Shingles   . GERD (gastroesophageal reflux disease)   . Abnormal heart rhythms   . Thyroid disease     hypthyroidism  . Pulmonary hypertension, moderate to severe     No evidence of CAD on Cath 2011  . Obesity, morbid (more than 100 lbs over ideal weight or BMI > 40)   . Chronic respiratory failure with hypoxia 01/23/2012    Related to OHS  . Glaucoma   . CHF (congestive heart failure)     Echo at [facility] 10/14/2012 see notes  . COPD (chronic  obstructive pulmonary disease)   . Acute on chronic renal failure    Past Surgical History  Procedure Laterality Date  . Tubal ligation    . Ovary removed    . Knee arthroscopy      left  . Cardiovascular stress test  09/15/2006    EF 81%; normal perfusion all regions; LV normal in size; no scintigraphic evidence of inducible myocardial ischemia  . Esophagogastroduodenoscopy N/A 06/25/2013    Procedure: ESOPHAGOGASTRODUODENOSCOPY (EGD);  Surgeon: Cleotis Nipper, MD;  Location[facility] ENDOSCOPY;  Service: Endoscopy;  Laterality: N/A;  pediatric upper endoscope; no sedation  . Cardiac catheterization  02/21/2010    EF 65%; PA pressure 86/21 with mean of 44; normal coronary arteries, primary pumonary hypertension; no significant wall motion abnormalities   Family History  Problem Relation Age of Onset  . Heart attack Father 34  . Heart disease Mother 52  . Diabetes Mother   . Lymphoma Daughter   . Diabetes      siblings, son  . Coronary artery disease Brother   . Diabetes Brother   . Stroke Brother   . Diabetes Brother   . Diabetes Sister   . Hypertension Sister   . Diabetes Sister   . Heart disease Sister   . Hypertension Child   . Sleep apnea Child   . Hypertension Child   .  Hypertension Child   . Arthritis/Rheumatoid Child   . Hyperlipidemia Child   . Hypertension Child    History  Substance Use Topics  . Smoking status: Never Smoker   . Smokeless tobacco: Current User    Types: Snuff  . Alcohol Use: No   OB History    No data available     Review of Systems  Constitutional: Negative for appetite change and fatigue.  HENT: Negative for congestion, ear discharge and sinus pressure.   Eyes: Negative for discharge.  Respiratory: Positive for shortness of breath.   Cardiovascular: Positive for leg swelling. Negative for chest pain.  Gastrointestinal: Negative for abdominal pain and diarrhea.  Genitourinary: Negative for frequency and hematuria.  Musculoskeletal:  Negative for back pain.  Skin: Negative for rash.  Neurological: Positive for headaches. Negative for seizures.  Psychiatric/Behavioral: Negative for hallucinations.      Allergies  Aspirin; Darvocet; Diphenhydramine hcl; Aloe vera; and Penicillins  Home Medications   Prior to Admission medications   Medication Sig Start Date End Date Taking? Authorizing Provider  acetaminophen (TYLENOL) 500 MG tablet Take 500 mg by mouth every 6 (six) hours as needed for mild pain or moderate pain.    Historical Provider, MD  ALPRAZolam Duanne Moron) 0.5 MG tablet Take 0.5 mg by mouth 2 (two) times daily. May take up to 4 times daily as prescribed    Historical Provider, MD  baclofen (LIORESAL) 20 MG tablet Take 20 mg by mouth daily as needed. For pain/spasms 06/18/14   Historical Provider, MD  brimonidine (ALPHAGAN P) 0.1 % SOLN Place 1 drop into both eyes daily. BRAND ONLY    Historical Provider, MD  carvedilol (COREG) 3.125 MG tablet Take 1 tablet (3.125 mg total) by mouth 2 (two) times daily with a meal. 05/03/14   Pixie Casino, MD  docusate sodium (STOOL SOFTENER) 100 MG capsule Take 100 mg by mouth 2 (two) times daily.    Historical Provider, MD  doxycycline (VIBRA-TABS) 100 MG tablet Take 1 tablet (100 mg total) by mouth every 12 (twelve) hours. 06/25/14   Nita Sells, MD  ezetimibe (ZETIA) 10 MG tablet Take 10 mg by mouth at bedtime.      Historical Provider, MD  fexofenadine (ALLEGRA) 180 MG tablet Take 180 mg by mouth every morning.    Historical Provider, MD  glipiZIDE (GLUCOTROL) 10 MG tablet Take 10 mg by mouth 2 (two) times daily before a meal.      Historical Provider, MD  IFEREX 150 150 MG capsule TAKE ONE CAPSULE BY MOUTH DAILY 06/27/14   Pixie Casino, MD  levothyroxine (SYNTHROID, LEVOTHROID) 150 MCG tablet Take 150 mcg by mouth daily before breakfast.     Historical Provider, MD  linagliptin (TRADJENTA) 5 MG TABS tablet Take 5 mg by mouth every morning.     Historical Provider,  MD  Multiple Vitamin (MULTIVITAMIN WITH MINERALS) TABS tablet Take 1 tablet by mouth daily.    Historical Provider, MD  oxyCODONE-acetaminophen (PERCOCET/ROXICET) 5-325 MG per tablet Take 1 tablet by mouth 2 (two) times daily. May take 1 additional dose if needed for pain    Historical Provider, MD  OXYGEN-HELIUM IN Inhale 4 L into the lungs continuous.    Historical Provider, MD  pantoprazole (PROTONIX) 40 MG tablet Take 40 mg by mouth 2 (two) times daily.    Historical Provider, MD  polyethylene glycol (MIRALAX / GLYCOLAX) packet Take 17 g by mouth as needed for mild constipation or moderate constipation.  Historical Provider, MD  potassium chloride SA (K-DUR,KLOR-CON) 20 MEQ tablet Take 2 tablets (40 mEq total) by mouth 2 (two) times daily. Patient taking differently: Take 20 mEq by mouth 2 (two) times daily.  09/16/13   Brett Canales, PA-C  pravastatin (PRAVACHOL) 20 MG tablet Take 20 mg by mouth every morning.     Historical Provider, MD  torsemide (DEMADEX) 20 MG tablet Take 40 mg in morning and 20 mg at night    Historical Provider, MD  Travoprost, BAK Free, (TRAVATAN) 0.004 % SOLN ophthalmic solution Place 1 drop into both eyes at bedtime. *BRAND ONLY    Historical Provider, MD   BP 106/56 mmHg  Pulse 91  Resp 22  Ht 5\' 1"  (1.549 m)  Wt 222 lb (100.699 kg)  BMI 41.97 kg/m2  SpO2 88% Physical Exam  Constitutional: She is oriented to person, place, and time. She appears well-developed.  HENT:  Head: Normocephalic.  Eyes: Conjunctivae and EOM are normal. No scleral icterus.  Neck: Neck supple. No thyromegaly present.  Cardiovascular: Normal rate and regular rhythm.  Exam reveals no gallop and no friction rub.   No murmur heard. Pulmonary/Chest: No stridor. She has no wheezes. She has no rales. She exhibits no tenderness.  Abdominal: She exhibits no distension. There is no tenderness. There is no rebound.  Musculoskeletal: Normal range of motion. She exhibits edema.  2+ edema in  ankles.  Lymphadenopathy:    She has no cervical adenopathy.  Neurological: She is oriented to person, place, and time. She exhibits normal muscle tone. Coordination normal.  Skin: No rash noted. No erythema.  Psychiatric: She has a normal mood and affect. Her behavior is normal.  Nursing note and vitals reviewed.   ED Course  Procedures (including critical care time)  DIAGNOSTIC STUDIES: Oxygen Saturation is 88% on [place], low by my interpretation.    COORDINATION OF CARE: 11:58 AM - Discussed treatment plan with pt at bedside and pt agreed to plan.   Labs Review Labs Reviewed - No data to display  Imaging Review No results found.   EKG Interpretation   Date/Time:  Saturday July 09 2014 11:59:02 EST Ventricular Rate:  84 PR Interval:  202 QRS Duration: 106 QT Interval:  432 QTC Calculation: 511 R Axis:   154 Text Interpretation:  .j Sinus rhythm Low voltage, precordial leads  Probable RVH w/ secondary repol abnormality Prolonged QT interval  Confirmed by Taeshawn Helfman  MD, Denece Shearer [phone]) on 07/11/2014 3:08:31 PM     CRITICAL CARE Performed by: Tyrisha Benninger L Total critical care time: 45 Critical care time was exclusive of separately billable procedures and treating other patients. Critical care was necessary to treat or prevent imminent or life-threatening deterioration. Critical care was time spent personally by me on the following activities: development of treatment plan with patient and/or surrogate as well as nursing, discussions with consultants, evaluation of patient's response to treatment, examination of patient, obtaining history from patient or surrogate, ordering and performing treatments and interventions, ordering and review of laboratory studies, ordering and review of radiographic studies, pulse oximetry and re-evaluation of patient's condition.  MDM   Final diagnoses:  None    The chart was scribed for me under my direct supervision.  I personally  performed the history, physical, and medical decision making and all procedures in the evaluation of this patient.Kari Diego, MD 06/27/2014 [phone]

## 2014-07-09 NOTE — ED Notes (Signed)
Pt c/o feeling like she was suffocating with Bipap on.  EDP aware, ativan given.

## 2014-07-09 NOTE — ED Notes (Signed)
Pt c/o of waking up this am with weakness and dizziness, SOB. CBG 265. Stated O2 stats were 71%. Seen here for same as two weeks ago. Patient currently on 5L with O2 stats 88%

## 2014-07-10 LAB — BASIC METABOLIC PANEL
ANION GAP: 16 — AB (ref 5–15)
BUN: 33 mg/dL — ABNORMAL HIGH (ref 6–23)
CHLORIDE: 96 meq/L (ref 96–112)
CO2: 29 meq/L (ref 19–32)
Calcium: 9.1 mg/dL (ref 8.4–10.5)
Creatinine, Ser: 1.33 mg/dL — ABNORMAL HIGH (ref 0.50–1.10)
GFR calc Af Amer: 43 mL/min — ABNORMAL LOW (ref >90)
GFR calc non Af Amer: 37 mL/min — ABNORMAL LOW (ref >90)
Glucose, Bld: 158 mg/dL — ABNORMAL HIGH (ref 70–99)
Potassium: 2.8 mEq/L — CL (ref 3.7–5.3)
SODIUM: 141 meq/L (ref 137–147)

## 2014-07-10 LAB — TROPONIN I
TROPONIN I: 0.37 ng/mL — AB (ref <0.30)
TROPONIN I: 0.35 ng/mL — AB (ref <0.30)

## 2014-07-10 LAB — GLUCOSE, CAPILLARY
GLUCOSE-CAPILLARY: 115 mg/dL — AB (ref 70–99)
GLUCOSE-CAPILLARY: 262 mg/dL — AB (ref 70–99)
Glucose-Capillary: 153 mg/dL — ABNORMAL HIGH (ref 70–99)
Glucose-Capillary: 189 mg/dL — ABNORMAL HIGH (ref 70–99)
Glucose-Capillary: 224 mg/dL — ABNORMAL HIGH (ref 70–99)

## 2014-07-10 LAB — MAGNESIUM: Magnesium: 2.1 mg/dL (ref 1.5–2.5)

## 2014-07-10 MED ORDER — INSULIN ASPART 100 UNIT/ML ~~LOC~~ SOLN
0.0000 [IU] | Freq: Three times a day (TID) | SUBCUTANEOUS | Status: DC
  Administered 2014-07-10 – 2014-07-11 (×2): 5 [IU] via SUBCUTANEOUS
  Administered 2014-07-11: 1 [IU] via SUBCUTANEOUS
  Administered 2014-07-11: 2 [IU] via SUBCUTANEOUS

## 2014-07-10 MED ORDER — POTASSIUM CHLORIDE 10 MEQ/100ML IV SOLN
10.0000 meq | INTRAVENOUS | Status: DC
  Administered 2014-07-10 (×3): 10 meq via INTRAVENOUS
  Filled 2014-07-10 (×4): qty 100

## 2014-07-10 MED ORDER — POTASSIUM CHLORIDE CRYS ER 20 MEQ PO TBCR
40.0000 meq | EXTENDED_RELEASE_TABLET | Freq: Once | ORAL | Status: AC
  Administered 2014-07-10: 40 meq via ORAL
  Filled 2014-07-10: qty 2

## 2014-07-10 MED ORDER — FUROSEMIDE 10 MG/ML IJ SOLN
40.0000 mg | Freq: Once | INTRAMUSCULAR | Status: AC
  Administered 2014-07-10: 40 mg via INTRAVENOUS
  Filled 2014-07-10: qty 4

## 2014-07-10 MED ORDER — POTASSIUM CHLORIDE CRYS ER 20 MEQ PO TBCR
20.0000 meq | EXTENDED_RELEASE_TABLET | Freq: Once | ORAL | Status: AC
  Administered 2014-07-10: 20 meq via ORAL
  Filled 2014-07-10: qty 1

## 2014-07-10 NOTE — Progress Notes (Signed)
PROGRESS NOTE  CHRYSA RAMPY QIW:[id number] DOB: [birth year] DOA: 06/30/2014 PCP: Rocky Morel, MD  Summary: 79 year old woman with history of severe right-sided heart failure, moderate to severe pulmonary hypertension, diastolic heart failure and chronic hypoxic respiratory failure who presented with history of increasing lower extremity edema, orthopnea, hypoxia and difficulty breathing. In the emergency room found to be hypoxic on usual oxygen, placed on BiPAP and admitted for treatment for heart failure.  Assessment/Plan: 1. Acute on chronic hypoxic respiratory failure secondary to heart failure. Improvement. Currently stable off BiPAP. 2. Acute on chronic multifactorial heart failure, diastolic, severe right-sided heart failure, moderate to severe pulmonary hypertension. Excellent diuresis. Overall somewhat improved but remains volume overloaded. 3. Elevated troponin, favor demand ischemia secondary to acute respiratory failure and heart failure. She has no chest pain. Continue to monitor troponins. No evidence to suggest ACS at this point. 4. Chronic hypoxic respiratory failure on 4 L nasal cannula. 5. Possible acute renal failure superimposed chronic kidney disease stage III, now appears to be at baseline. 6. Anemia of chronic disease 7. Diabetes mellitus, remains stable. 8. Morbid obesity   Overall appears much better today. Trial off BiPAP today. Advance diet. Continue diuresis for heart failure. Blood pressure has remained stable.  Repeat basic metabolic panel in the morning.  Trend troponin.  Cardiology consultation the morning for further recommendations.  May be able to transfer out of stepdown next 24 hours  Code Status: full code DVT prophylaxis: Lovenox Family Communication: none present Disposition Plan: home  Murray Hodgkins, MD  [facility]  Pager [phone] If 7PM-7AM, please contact night-coverage at [email or web address], password [facility] 07/10/2014,  1:09 PM  LOS: 1 day   Consultants:    Procedures:    Antibiotics:    HPI/Subjective: No issues overnight except troponin slightly positive. Off BiPAP this morning. Breathing better. Feeling much better. Hungry. No chest pain.  Objective: Filed Vitals:   07/10/14 0700 07/10/14 0826 07/10/14 1157 07/10/14 1209  BP: 108/59     Pulse: 103     Temp:  96.9 F (36.1 C)  97.5 F (36.4 C)  TempSrc:  Axillary  Oral  Resp: 15     Height:      Weight:      SpO2: 92%  92%     Intake/Output Summary (Last 24 hours) at 07/10/14 1309 Last data filed at 07/10/14 0500  Gross per 24 hour  Intake      0 ml  Output   3400 ml  Net  -3400 ml     Filed Weights   06/29/2014 1137 07/22/2014 1646 07/10/14 0500  Weight: 100.699 kg (222 lb) 101.8 kg (224 lb 6.9 oz) 100.6 kg (221 lb 12.5 oz)    Exam:     Afebrile, vital signs stable. Stable in nasal cannula.  General: appears calm, comfortable.  Psych: alert appears reach fluent and clear.  Eyes: appear grossly unremarkable  CV: regular rate and rhythm. No murmur, rub or gallop. Some decrease in bilateral lower extremity edema. Still 2-3+ bilaterally.  Respiratory: clear to auscultation bilaterally with fair air movement. No frank wheezes, rales or rhonchi.In full sentences. Mild increased respiratory effort.  Neuro: grossly unremarkable  Data Reviewed:  Weight without significant change. Urine output 3.4 L.  Capillary blood sugar stable.  Potassium 2.8. Creatinine has improved with diuresis, 1.33. BUN also improved. Magnesium within normal limits.  Troponin modestly elevated 0.37 >> 0.35  Scheduled Meds: . antiseptic oral rinse  7 mL Mouth Rinse q12n4p  . brimonidine  1 drop Both Eyes Daily  . carvedilol  3.125 mg Oral BID WC  . chlorhexidine  15 mL Mouth Rinse BID  . ezetimibe  10 mg Oral QHS  . furosemide  40 mg Intravenous Once  . heparin  5,000 Units Subcutaneous 3 times per day  . insulin aspart  0-9 Units  Subcutaneous TID WC  . latanoprost  1 drop Both Eyes QHS  . levothyroxine  150 mcg Oral QAC breakfast  . pantoprazole  40 mg Oral BID  . potassium chloride  40 mEq Oral Once  . sodium chloride  3 mL Intravenous Q12H   Continuous Infusions:   Principal Problem:   Acute on chronic systolic right heart failure Active Problems:   Chronic respiratory failure with hypoxia   DM2 (diabetes mellitus, type 2)   Obesity, morbid (more than 100 lbs over ideal weight or BMI > 40)   Pulmonary hypertension, Severe by Echo 02/1713-pa pressure 74 mmHg   Hypokalemia   Acute on chronic diastolic heart failure   Acute respiratory failure with hypoxia   Time spent 25 minutes

## 2014-07-11 DIAGNOSIS — E039 Hypothyroidism, unspecified: Secondary | ICD-10-CM

## 2014-07-11 DIAGNOSIS — J9611 Chronic respiratory failure with hypoxia: Secondary | ICD-10-CM

## 2014-07-11 DIAGNOSIS — N183 Chronic kidney disease, stage 3 (moderate): Secondary | ICD-10-CM

## 2014-07-11 LAB — BLOOD GAS, ARTERIAL
ACID-BASE EXCESS: 4 mmol/L — AB (ref 0.0–2.0)
Bicarbonate: 27.8 mEq/L — ABNORMAL HIGH (ref 20.0–24.0)
FIO2: 1 %
O2 Content: 15 L/min
O2 SAT: 81.3 %
Patient temperature: 37
TCO2: 25.5 mmol/L (ref 0–100)
pCO2 arterial: 40.7 mmHg (ref 35.0–45.0)
pH, Arterial: 7.449 (ref 7.350–7.450)
pO2, Arterial: 48.2 mmHg — ABNORMAL LOW (ref 80.0–100.0)

## 2014-07-11 LAB — BASIC METABOLIC PANEL
ANION GAP: 13 (ref 5–15)
BUN: 37 mg/dL — ABNORMAL HIGH (ref 6–23)
CO2: 30 mEq/L (ref 19–32)
Calcium: 8.9 mg/dL (ref 8.4–10.5)
Chloride: 96 mEq/L (ref 96–112)
Creatinine, Ser: 1.47 mg/dL — ABNORMAL HIGH (ref 0.50–1.10)
GFR calc Af Amer: 39 mL/min — ABNORMAL LOW (ref >90)
GFR, EST NON AFRICAN AMERICAN: 33 mL/min — AB (ref >90)
Glucose, Bld: 128 mg/dL — ABNORMAL HIGH (ref 70–99)
Potassium: 2.9 mEq/L — CL (ref 3.7–5.3)
SODIUM: 139 meq/L (ref 137–147)

## 2014-07-11 LAB — GLUCOSE, CAPILLARY
GLUCOSE-CAPILLARY: 263 mg/dL — AB (ref 70–99)
Glucose-Capillary: 137 mg/dL — ABNORMAL HIGH (ref 70–99)
Glucose-Capillary: 175 mg/dL — ABNORMAL HIGH (ref 70–99)

## 2014-07-11 MED ORDER — LORAZEPAM 2 MG/ML IJ SOLN
0.5000 mg | Freq: Four times a day (QID) | INTRAMUSCULAR | Status: DC | PRN
  Administered 2014-07-11: 0.5 mg via INTRAVENOUS
  Filled 2014-07-11: qty 1

## 2014-07-11 MED ORDER — LIDOCAINE HCL (CARDIAC) 20 MG/ML IV SOLN
INTRAVENOUS | Status: AC
  Filled 2014-07-11: qty 5

## 2014-07-11 MED ORDER — TORSEMIDE 20 MG PO TABS
20.0000 mg | ORAL_TABLET | Freq: Every day | ORAL | Status: DC
  Administered 2014-07-11: 20 mg via ORAL
  Filled 2014-07-11: qty 1

## 2014-07-11 MED ORDER — ETOMIDATE 2 MG/ML IV SOLN
INTRAVENOUS | Status: AC
  Filled 2014-07-11: qty 20

## 2014-07-11 MED ORDER — METHYLPREDNISOLONE SODIUM SUCC 125 MG IJ SOLR
80.0000 mg | Freq: Three times a day (TID) | INTRAMUSCULAR | Status: DC
  Administered 2014-07-11: 80 mg via INTRAVENOUS
  Filled 2014-07-11: qty 2

## 2014-07-11 MED ORDER — METOPROLOL TARTRATE 25 MG PO TABS
12.5000 mg | ORAL_TABLET | Freq: Two times a day (BID) | ORAL | Status: DC
  Administered 2014-07-11 (×2): 12.5 mg via ORAL
  Filled 2014-07-11 (×2): qty 1

## 2014-07-11 MED ORDER — OXYCODONE-ACETAMINOPHEN 5-325 MG PO TABS
1.0000 | ORAL_TABLET | Freq: Once | ORAL | Status: AC
  Administered 2014-07-11: 1 via ORAL
  Filled 2014-07-11: qty 1

## 2014-07-11 MED ORDER — ALBUTEROL SULFATE (2.5 MG/3ML) 0.083% IN NEBU
2.5000 mg | INHALATION_SOLUTION | Freq: Once | RESPIRATORY_TRACT | Status: AC
  Administered 2014-07-11: 2.5 mg via RESPIRATORY_TRACT
  Filled 2014-07-11: qty 3

## 2014-07-11 MED ORDER — CYCLOBENZAPRINE HCL 10 MG PO TABS: 5.0000 mg | ORAL_TABLET | Freq: Once | ORAL | Status: DC

## 2014-07-11 MED ORDER — TORSEMIDE 20 MG PO TABS
40.0000 mg | ORAL_TABLET | Freq: Every day | ORAL | Status: DC
  Administered 2014-07-11: 40 mg via ORAL
  Filled 2014-07-11: qty 2

## 2014-07-11 MED ORDER — ALPRAZOLAM 0.5 MG PO TABS
0.5000 mg | ORAL_TABLET | Freq: Three times a day (TID) | ORAL | Status: DC | PRN
  Administered 2014-07-11: 0.5 mg via ORAL
  Filled 2014-07-11: qty 1

## 2014-07-11 MED ORDER — NITROGLYCERIN 2 % TD OINT
0.5000 [in_us] | TOPICAL_OINTMENT | Freq: Three times a day (TID) | TRANSDERMAL | Status: DC
  Administered 2014-07-12: 0.5 [in_us] via TOPICAL
  Filled 2014-07-11: qty 1

## 2014-07-11 MED ORDER — POTASSIUM CHLORIDE CRYS ER 20 MEQ PO TBCR
40.0000 meq | EXTENDED_RELEASE_TABLET | Freq: Three times a day (TID) | ORAL | Status: AC
  Administered 2014-07-11 (×3): 40 meq via ORAL
  Filled 2014-07-11 (×3): qty 2

## 2014-07-11 MED ORDER — ROCURONIUM BROMIDE 50 MG/5ML IV SOLN
INTRAVENOUS | Status: AC
  Filled 2014-07-11: qty 2

## 2014-07-11 MED ORDER — SUCCINYLCHOLINE CHLORIDE 20 MG/ML IJ SOLN
INTRAMUSCULAR | Status: AC
  Filled 2014-07-11: qty 1

## 2014-07-11 MED ORDER — FUROSEMIDE 10 MG/ML IJ SOLN: 40.0000 mg | Freq: Two times a day (BID) | INTRAMUSCULAR | Status: DC

## 2014-07-11 NOTE — Progress Notes (Signed)
PROGRESS NOTE  [name] VQM:[id number] DOB: 1935/05/05 DOA: 07/11/2014 PCP: Rocky Morel, MD  Summary: 79 year old woman with history of severe right-sided heart failure, moderate to severe pulmonary hypertension, diastolic heart failure and chronic hypoxic respiratory failure who presented with history of increasing lower extremity edema, orthopnea, hypoxia and difficulty breathing. In the emergency room found to be hypoxic on usual oxygen, placed on BiPAP and admitted for treatment for heart failure. She diuresed rapidly with IV Lasix and now appears to be a goal weight, she remained significantly hypoxic secondary to multiple comorbidities but is nontoxic. Plan continue treatment for heart failure in wean oxygen as tolerated.  Assessment/Plan: 1. Acute on chronic hypoxic respiratory failure secondary to heart failure as outlined below. No significant change, continues to use BiPAP at night, continues to require facemask during the day. No distress. 2. Acute on chronic multifactorial heart failure, diastolic, severe right-sided heart failure, moderate to severe pulmonary hypertension. Acute heart failure appears resolved. Plan transition oral therapy. 3. Elevated troponin, favor demand ischemia secondary to acute respiratory failure and heart failure. Troponin has returned to normal. No further evaluation per cardiology. 4. Chronic hypoxic respiratory failure on 4 L nasal cannula. 5. Possible acute renal failure superimposed chronic kidney disease stage III, renal function appears to be at baseline. 6. Anemia of chronic disease 7. Diabetes mellitus, remains stable. 8. Morbid obesity   Overall appears improved although she remained significantly hypoxic above baseline. No distress and appears quite stable at this point. ABG with normal pH and PCO2. Hypoxemia is noted. Plan to continue supplemental oxygen, BiPAP as needed. Anxiolytics as needed to help her tolerate  this.  Appreciate cardiology recommendations. Plan transition oral diuretics.  Hopefully can mobilize his oxygenation improves.  Code Status: full code DVT prophylaxis: Lovenox Family Communication: none present Disposition Plan: home  Murray Hodgkins, MD  [facility]  Pager [phone] If 7PM-7AM, please contact night-coverage at [email or web address], password [facility] 07/11/2014, 8:19 AM  LOS: 2 days   Consultants:    Procedures:    Antibiotics:    HPI/Subjective: Required nonrebreather yesterday. Tolerated BiPAP overnight.   Feels better, breathing better. SpO2 88-90% on face mask. No shortness of breath. No chest pain. Decreased lower extremity edema. No nausea or vomiting.  Objective: Filed Vitals:   07/11/14 0602 07/11/14 0700 07/11/14 0800 07/11/14 0807  BP: 123/75 114/55 130/66   Pulse: 95 98 103   Temp:    97 F (36.1 C)  TempSrc:    Oral  Resp: 17 18 17    Height:      Weight:      SpO2: 95% 94% 92%     Intake/Output Summary (Last 24 hours) at 07/11/14 0819 Last data filed at 07/11/14 0807  Gross per 24 hour  Intake    480 ml  Output   2301 ml  Net  -1821 ml     Filed Weights   07/05/2014 1646 07/10/14 0500 07/11/14 0500  Weight: 101.8 kg (224 lb 6.9 oz) 100.6 kg (221 lb 12.5 oz) 100.5 kg (221 lb 9 oz)    Exam:     Afebrile, vital signs stable. Hypoxia 88-90 percent.  Alert. Appears calm and comfortable. Does not appear to be in any distress.  Speech fluent and clear. Grossly normal mentation.  Cardiovascular regular rate and rhythm. No murmur, rub or gallop. Lower extremity edema trace, nearly resolved. Telemetry sinus rhythm.  Respiratory clear to auscultation bilaterally. No wheezes, rales or rhonchi. Diminished breath sounds.  Abdomen soft.  Data Reviewed:  Weight without change, likely inaccurate. Urine output 2300. -5.2 L I/O since admission.  Capillary blood sugars stable. Potassium 2.9. BUN and creatinine stable.  Troponin has  returned to normal.  Scheduled Meds: . antiseptic oral rinse  7 mL Mouth Rinse q12n4p  . brimonidine  1 drop Both Eyes Daily  . carvedilol  3.125 mg Oral BID WC  . chlorhexidine  15 mL Mouth Rinse BID  . ezetimibe  10 mg Oral QHS  . heparin  5,000 Units Subcutaneous 3 times per day  . insulin aspart  0-9 Units Subcutaneous TID WC  . latanoprost  1 drop Both Eyes QHS  . levothyroxine  150 mcg Oral QAC breakfast  . pantoprazole  40 mg Oral BID  . potassium chloride  40 mEq Oral TID  . sodium chloride  3 mL Intravenous Q12H   Continuous Infusions:   Principal Problem:   Acute on chronic systolic right heart failure Active Problems:   Chronic respiratory failure with hypoxia   DM2 (diabetes mellitus, type 2)   Obesity, morbid (more than 100 lbs over ideal weight or BMI > 40)   Pulmonary hypertension, Severe by Echo 02/1713-pa pressure 74 mmHg   Hypokalemia   Acute on chronic diastolic heart failure   Acute respiratory failure with hypoxia   Time spent 20 minutes

## 2014-07-11 NOTE — Consult Note (Signed)
CARDIOLOGY CONSULT NOTE   Patient ID: [name] MRN: [id number] DOB/AGE: 10/01/1934 79 y.o.  Admit Date: 07/06/2014 Referring Physician: PTH-Goodrich MD Primary Physician: Rocky Morel, MD Consulting Cardiologist: Kate Sable MD Primary Cardiologist: Debara Pickett MD Reason for Consultation: CHF  Clinical Summary [name] is a 79 y.o.female with known history of severe right sided CHF, moderate to severe pulmonary hypertension, chronic respiratory failure with COPD )2 dependent 5/L with NYHA Class III symptoms chronically, multiple admissions for diastolic CHF with dry wt of 201- 207 lbs, admitted with A/C diastolic CHF and hypoxia. Required BiPAP in ER.   She had been treated by Dr. Debara Pickett and Kerin Ransom on follow up appt from recent hospitalization for CHF in Oct of 2015. Demedex dose was increased as OP. She continued to be fluid overloaded with continued LEE and dyspnea. She was admitted approx 15 lbs over dry wt.   She states that symptoms began 2 weeks ago after going to [facility] when she noticed LE and worsening dyspnea which progressed. She called Dr. Debara Pickett and had her torsemide increased and adjusted. She stated that it did help at first but fluid continued to build up. She eats a lot of deli meats and is evasive about other foods that have salt.   She was treated in ER with solu-medral 125 mg, Duo neb inhalers, lasix 40 mg IV and lorazepam. She has diuresed 5.1 liters since admission. Labs were found to be abnormal with potassium 3.1, Mg 2.1,Creatinine 1.67, Pro-BNP 7,457, Hgb 10.2, glucose elevated 252. Potassium is being repleted. Wt down from 224 to 221 lbs.    Allergies  Allergen Reactions  . Aspirin Other (See Comments)    GI bleed/ PUD  . Darvocet [Propoxyphene N-Acetaminophen] Nausea And Vomiting  . Diphenhydramine Hcl Other (See Comments)    Glaucoma prevents patient from taking this medication   . Aloe Vera Rash  . Penicillins Swelling and  Rash    Medications Scheduled Medications: . antiseptic oral rinse  7 mL Mouth Rinse q12n4p  . brimonidine  1 drop Both Eyes Daily  . carvedilol  3.125 mg Oral BID WC  . chlorhexidine  15 mL Mouth Rinse BID  . ezetimibe  10 mg Oral QHS  . heparin  5,000 Units Subcutaneous 3 times per day  . insulin aspart  0-9 Units Subcutaneous TID WC  . latanoprost  1 drop Both Eyes QHS  . levothyroxine  150 mcg Oral QAC breakfast  . pantoprazole  40 mg Oral BID  . potassium chloride  40 mEq Oral TID  . sodium chloride  3 mL Intravenous Q12H       PRN Medications: sodium chloride, acetaminophen, LORazepam, ondansetron (ZOFRAN) IV, sodium chloride   Past Medical History  Diagnosis Date  . Diabetes mellitus   . Hypertension   . Shingles   . GERD (gastroesophageal reflux disease)   . Abnormal heart rhythms   . Thyroid disease     hypthyroidism  . Pulmonary hypertension, moderate to severe     No evidence of CAD on Cath 2011  . Obesity, morbid (more than 100 lbs over ideal weight or BMI > 40)   . Chronic respiratory failure with hypoxia 01/23/2012    Related to OHS  . Glaucoma   . CHF (congestive heart failure)     Echo at [facility] 10/14/2012 see notes  . COPD (chronic obstructive pulmonary disease)   . Acute on chronic renal failure     Past Surgical History  Procedure Laterality Date  . Tubal ligation    . Ovary removed    . Knee arthroscopy      left  . Cardiovascular stress test  09/15/2006    EF 81%; normal perfusion all regions; LV normal in size; no scintigraphic evidence of inducible myocardial ischemia  . Esophagogastroduodenoscopy N/A 06/25/2013    Procedure: ESOPHAGOGASTRODUODENOSCOPY (EGD);  Surgeon: Cleotis Nipper, MD;  Location: [facility] ENDOSCOPY;  Service: Endoscopy;  Laterality: N/A;  pediatric upper endoscope; no sedation  . Cardiac catheterization  02/21/2010    EF 65%; PA pressure 86/21 with mean of 44; normal coronary arteries, primary pumonary hypertension; no  significant wall motion abnormalities    Family History  Problem Relation Age of Onset  . Heart attack Father 72  . Heart disease Mother 61  . Diabetes Mother   . Lymphoma Daughter   . Diabetes      siblings, son  . Coronary artery disease Brother   . Diabetes Brother   . Stroke Brother   . Diabetes Brother   . Diabetes Sister   . Hypertension Sister   . Diabetes Sister   . Heart disease Sister   . Hypertension Child   . Sleep apnea Child   . Hypertension Child   . Hypertension Child   . Arthritis/Rheumatoid Child   . Hyperlipidemia Child   . Hypertension Child     Social History [name] reports that she has never smoked. Her smokeless tobacco use includes Snuff. [name] reports that she does not drink alcohol.  Review of Systems Otherwise reviewed and negative except as outlined.  Physical Examination Blood pressure 130/66, pulse 103, temperature 97 F (36.1 C), temperature source Oral, resp. rate 17, height 5\' 2"  (1.575 m), weight 221 lb 9 oz (100.5 kg), SpO2 92 %.  Intake/Output Summary (Last 24 hours) at 07/11/14 0855 Last data filed at 07/11/14 0807  Gross per 24 hour  Intake    480 ml  Output   2301 ml  Net  -1821 ml    Telemetry: NSR   GEN: Resting comfortably with venti mask, obese. Marland Kitchen  HEENT: Conjunctiva and lids normal, oropharynx clear with moist mucosa. Neck: Supple, no elevated JVP or carotid bruits, no thyromegaly. Lungs: Mild bibasilar crackles. No wheezes.  Cardiac: Regular rate and rhythm, no S3 or significant systolic murmur, no pericardial rub. Abdomen: Soft, nontender, no hepatomegaly, bowel sounds present, no guarding or rebound. Extremities: No pitting edema, distal pulses 2+. Skin: Warm and dry. Musculoskeletal: No kyphosis. Neuropsychiatric: Alert and oriented x3, affect grossly appropriate.  Prior Cardiac Testing/Procedures NM Stress Test: 09/16/2006 Normal perfusion images show a normal pattern of perfusion in all regions,  The post stress left ventricle is normal in size. There is no scintigraphic evidence of inducible myocardial ischemia. The observed defect is consistent with breast attenuation artifact. The observed defect is consistent with diaphragmatic attenuation. The post stress left ventricular function is normal. The post stress EF is 81%. Unremarkable pharmacological stress test. (Transcribed from scanned document)  Echocardiogram: 09/08/2013 Left ventricle: Systolic function was normal. The estimated ejection fraction was in the range of 55% to 60%. Wall motion was normal; there were no regional wall motion abnormalities. Due to tachycardia, there was fusion of early and atrial contributions to ventricular filling. The study is not technically sufficient to allow evaluation of LV diastolic function. - Ventricular septum: The contour showed systolic flattening. These changes are consistent with RV pressure overload. - Aortic valve: Trivial regurgitation. - Right ventricle:  The cavity size was severely dilated. Systolic function was severely reduced. - Right atrium: The atrium was mildly dilated. - Tricuspid valve: Moderate regurgitation. - Pulmonary arteries: Systolic pressure was moderately to severely increased. PA peak pressure: 58mm Hg (S).   Lab Results  Basic Metabolic Panel:  Recent Labs Lab 07/14/2014 1159 07/10/14 0508 07/10/14 0512 07/11/14 0514  NA 135*  --  141 139  K 3.1*  --  2.8* 2.9*  CL 92*  --  96 96  CO2 23  --  29 30  GLUCOSE 251*  --  158* 128*  BUN 35*  --  33* 37*  CREATININE 1.67*  --  1.33* 1.47*  CALCIUM 8.9  --  9.1 8.9  MG  --  2.1  --   --     Liver Function Tests:  Recent Labs Lab 07/15/2014 1159  AST 20  ALT 9  ALKPHOS 121*  BILITOT 1.0  PROT 6.3  ALBUMIN 3.7    CBC:  Recent Labs Lab 07/22/2014 1159  WBC 7.5  NEUTROABS 5.4  HGB 10.2*  HCT 35.8*  MCV 67.5*  PLT 151    Cardiac Enzymes:  Recent Labs Lab  07/07/2014 1159 07/22/2014 1748 07/23/2014 2359 07/10/14 0512 07/10/14 1356  TROPONINI <0.30 <0.30 0.37* 0.35* <0.30    BNP: Invalid input(s): POCBNP   Radiology: Ct Angio Chest Pe W/cm &/or Wo Cm  07/02/2014   CLINICAL DATA:  Worsening shortness of Breath  EXAM: CT ANGIOGRAPHY CHEST WITH CONTRAST  TECHNIQUE: Multidetector CT imaging of the chest was performed using the standard protocol during bolus administration of intravenous contrast. Multiplanar CT image reconstructions and MIPs were obtained to evaluate the vascular anatomy.  CONTRAST:  153mL OMNIPAQUE IOHEXOL 350 MG/ML SOLN  COMPARISON:  None.  FINDINGS: Bilateral pleural effusions left greater than right are noted. Left lower lobe atelectasis is noted. No focal confluent infiltrate is seen. The thoracic aorta shows calcific changes without aneurysmal dilatation or aortic dissection. Moderate coronary calcifications are seen. The pulmonary artery shows a normal branching pattern without evidence of intraluminal filling defect to suggest pulmonary embolus. No hilar or mediastinal adenopathy is noted.  Enlargement of right ventricle and right atrium are noted consistent with a degree of right heart failure.  The upper abdomen demonstrates mild ascites. No other focal abnormality is noted.  Review of the MIP images confirms the above findings.  IMPRESSION: No evidence of pulmonary emboli.  Bilateral pleural effusions are seen.  Mild ascites.  Changes consistent with right heart failure an increased central venous pressure.   Electronically Signed   By: Inez Catalina M.D.   On: 07/05/2014 14:22   Dg Chest Portable 1 View  07/11/2014   CLINICAL DATA:  Worsening shortness of Breath, headache, history of COPD  EXAM: PORTABLE CHEST - 1 VIEW  COMPARISON:  06/14/2014  FINDINGS: Cardiomegaly. No pulmonary edema. There is small left pleural effusion with left basilar atelectasis or infiltrate. Right lung is clear.  IMPRESSION: No pulmonary edema. Small left  pleural effusion. Left basilar atelectasis or infiltrate. Cardiomegaly.   Electronically Signed   By: Lahoma Crocker M.D.   On: 07/08/2014 13:00     ECG: NSR with RVH    Impression and Recommendations  1.Acute on Chronic Right Heart Failure with Diastolic CHF: States symptoms began two weeks ago. Torsemide increased as OP, but then decreased after follow up labs demonstrated worsening renal function. She admits to eating deli meats, but is evasive about other foods containing salt. She  has diuresed 5 liters since admission and is now breathing better, with improved LEE. She is now on venti mask. She is not on routine lasix now, having last dose on Sunday. Creatinine is 1.47. Potassium is 2.9 and is now getting 40 mEq QID po. Home dose of torsemide is 40 mg in am 20 mg in pm with metolazone 2.5 mg MWF. May need to increase home dose of torsemide and allow for mildly elevated creatinine. GFR 33. She will need to be stricter on her dietary compliance. Continue low dose carvedilol. No wheezing noted.   2. COPD; Followed by Dr. Halford Chessman, last seen in August of 2014 per notes. O2 dependent with Class III symptoms chronically. She is on neb tx.    3.CKD Stage IIl:  Creatinine continues to be elevated, but improved since admission of 1.67. CO2 30. Transition back to po torsemide 40 mg am and 20 mg pm.   4. Hypothyroidism: On thyroid replacement.   5. Diabetes: Not well controlled. On insulin per PTH. Likely from steroids keeping BG up with known diabetes.   Signed: Phill Myron. Elizandro [name] NP  07/11/2014, 8:55 AM Co-Sign MD

## 2014-07-11 NOTE — Progress Notes (Signed)
Inpatient Diabetes Program Recommendations  AACE/ADA: New Consensus Statement on Inpatient Glycemic Control (2013)  Target Ranges:  Prepandial:   less than 140 mg/dL      Peak postprandial:   less than 180 mg/dL (1-2 hours)      Critically ill patients:  140 - 180 mg/dL   Results for ELAURA, CALIX (MRN [id number]) as of 07/11/2014 13:24  Ref. Range 07/10/2014 06:13 07/10/2014 11:36 07/10/2014 17:19 07/10/2014 21:23 07/11/2014 07:49 07/11/2014 11:23  Glucose-Capillary Latest Range: 70-99 mg/dL 153 (H) 115 (H) 262 (H) 189 (H) 137 (H) 263 (H)   Diabetes history: DM2 Outpatient Diabetes medications: Tradjenta 5 mg QAM, Glipizide 10 mg BID Current orders for Inpatient glycemic control: Novolog 0-9 units AC  Inpatient Diabetes Program Recommendations Correction (SSI): Please consider ordering Novolog bedtime correction scale. Insulin - Meal Coverage: Please consider ordering Novolog 3 units TID with meals for meal coverage (in addition to Novolog correction scale) if patient eating at least 50% of meal.  Thanks, Barnie Alderman, RN, MSN, CCRN, CDE Diabetes Coordinator Inpatient Diabetes Program [phone] (Team Pager) [phone] ([facility] office) [phone] [facility] office)

## 2014-07-11 NOTE — Progress Notes (Signed)
Placed patient on 6L [place] per request. Patient stated she would do all right with "just the nose one". Patients sats dropped immediately to 77%. Placed patient back on NRB.

## 2014-07-11 NOTE — Care Management Note (Signed)
    Page 1 of 1   07/11/2014     1:41:06 PM CARE MANAGEMENT NOTE 07/11/2014  Patient:  Kari, Lane   Account Number:  [id number]  Date Initiated:  07/11/2014  Documentation initiated by:  Jolene Provost  Subjective/Objective Assessment:   Pt is from home, lives with brother. Pt has CAP aid at home prior to admission. Pt has Home O2 through [facility]. Pt perfers [facility] if HH needed at discharge. Pt has walker, cane and wheelchair at home. Daughter at bedside during assessment.     Action/Plan:   Pt plans to discharge home. Will continue to follow for CM needs.   Anticipated DC Date:  07/14/2014   Anticipated DC Plan:  [place]  CM consult      Choice offered to / List presented to:             Status of service:  In process, will continue to follow Medicare Important Message given?   (If response is "NO", the following Medicare IM given date fields will be blank) Date Medicare IM given:   Medicare IM given by:   Date Additional Medicare IM given:   Additional Medicare IM given by:    Discharge Disposition:  [place]  Per UR Regulation:    If discussed at Long Length of Stay Meetings, dates discussed:    Comments:  07/11/2014 [place], RN, MSN, [facility]

## 2014-07-11 NOTE — Care Management Utilization Note (Signed)
UR review complete.  

## 2014-07-12 ENCOUNTER — Inpatient Hospital Stay (HOSPITAL_COMMUNITY): Payer: PRIVATE HEALTH INSURANCE

## 2014-07-12 ENCOUNTER — Ambulatory Visit: Payer: PRIVATE HEALTH INSURANCE | Admitting: Internal Medicine

## 2014-07-12 DIAGNOSIS — I509 Heart failure, unspecified: Secondary | ICD-10-CM | POA: Insufficient documentation

## 2014-07-12 LAB — BLOOD GAS, ARTERIAL
Acid-base deficit: 3.3 mmol/L — ABNORMAL HIGH (ref 0.0–2.0)
Bicarbonate: 21.2 mEq/L (ref 20.0–24.0)
DRAWN BY: 38235
FIO2: 1 %
LHR: 18 {breaths}/min
MECHVT: 600 mL
O2 Saturation: 58.9 %
PATIENT TEMPERATURE: 37
PEEP/CPAP: 10 cmH2O
PH ART: 7.362 (ref 7.350–7.450)
TCO2: 19.7 mmol/L (ref 0–100)
pCO2 arterial: 38.3 mmHg (ref 35.0–45.0)
pO2, Arterial: 37.3 mmHg — CL (ref 80.0–100.0)

## 2014-07-12 MED ORDER — FENTANYL CITRATE 0.05 MG/ML IJ SOLN
100.0000 ug | INTRAMUSCULAR | Status: DC | PRN
  Administered 2014-07-12: 75 ug via INTRAVENOUS

## 2014-07-12 MED ORDER — CISATRACURIUM BOLUS VIA INFUSION
10.0000 mg | Freq: Once | INTRAVENOUS | Status: DC
Start: 2014-07-12 — End: 2014-07-12
  Filled 2014-07-12: qty 10

## 2014-07-12 MED ORDER — FENTANYL BOLUS VIA INFUSION
50.0000 ug | INTRAVENOUS | Status: DC | PRN
  Filled 2014-07-12: qty 50

## 2014-07-12 MED ORDER — FENTANYL CITRATE 0.05 MG/ML IJ SOLN
50.0000 ug | INTRAMUSCULAR | Status: DC | PRN
  Filled 2014-07-12: qty 2

## 2014-07-12 MED ORDER — PHENYLEPHRINE HCL 10 MG/ML IJ SOLN
30.0000 ug/min | INTRAMUSCULAR | Status: DC
  Administered 2014-07-12: 30 ug/min via INTRAVENOUS
  Filled 2014-07-12: qty 1

## 2014-07-12 MED ORDER — FENTANYL CITRATE 0.05 MG/ML IJ SOLN: 100.0000 ug | Freq: Once | INTRAMUSCULAR | Status: DC

## 2014-07-12 MED ORDER — PROPOFOL 10 MG/ML IV EMUL
5.0000 ug/kg/min | INTRAVENOUS | Status: DC
  Administered 2014-07-12 (×3): 10 ug/kg/min via INTRAVENOUS

## 2014-07-12 MED ORDER — FENTANYL CITRATE 0.05 MG/ML IJ SOLN
INTRAMUSCULAR | Status: AC
  Filled 2014-07-12: qty 50

## 2014-07-12 MED ORDER — ARTIFICIAL TEARS OP OINT
1.0000 "application " | TOPICAL_OINTMENT | Freq: Three times a day (TID) | OPHTHALMIC | Status: DC
  Filled 2014-07-12: qty 3.5

## 2014-07-12 MED ORDER — PHENYLEPHRINE HCL 10 MG/ML IJ SOLN
INTRAMUSCULAR | Status: AC
  Filled 2014-07-12: qty 1

## 2014-07-12 MED ORDER — PROPOFOL 10 MG/ML IV EMUL: 0.0000 ug/kg/min | INTRAVENOUS | Status: DC

## 2014-07-12 MED ORDER — SODIUM CHLORIDE 0.9 % IV SOLN: 0.0000 ug/h | INTRAVENOUS | Status: DC

## 2014-07-12 MED ORDER — SODIUM CHLORIDE 0.9 % IV SOLN
25.0000 ug/h | INTRAVENOUS | Status: DC
  Administered 2014-07-12: 50 ug/h via INTRAVENOUS
  Filled 2014-07-12: qty 50

## 2014-07-12 MED ORDER — FENTANYL BOLUS VIA INFUSION: 25.0000 ug | INTRAVENOUS | Status: DC | PRN

## 2014-07-12 MED ORDER — PANTOPRAZOLE SODIUM 40 MG IV SOLR: 40.0000 mg | Freq: Every day | INTRAVENOUS | Status: DC

## 2014-07-12 MED ORDER — SODIUM CHLORIDE 0.9 % IV SOLN
3.0000 ug/kg/min | INTRAVENOUS | Status: DC
  Filled 2014-07-12: qty 20

## 2014-07-12 MED ORDER — ARTIFICIAL TEARS OP OINT: 1.0000 "application " | TOPICAL_OINTMENT | Freq: Three times a day (TID) | OPHTHALMIC | Status: DC

## 2014-07-12 MED ORDER — FENTANYL CITRATE 0.05 MG/ML IJ SOLN
INTRAMUSCULAR | Status: AC
  Filled 2014-07-12: qty 2

## 2014-07-12 MED ORDER — PROPOFOL 10 MG/ML IV EMUL
INTRAVENOUS | Status: AC
  Filled 2014-07-12: qty 100

## 2014-07-12 MED FILL — Medication: Qty: 1 | Status: AC

## 2014-07-13 ENCOUNTER — Ambulatory Visit: Payer: PRIVATE HEALTH INSURANCE | Admitting: Cardiology

## 2014-07-22 NOTE — Discharge Summary (Addendum)
Death Summary  WARRENE KAPFER BPP:[id number] DOB: Feb 07, 1935 DOA: July 18, 2014  PCP: Rocky Morel, MD  Admit date: Jul 18, 2014 Date of Death: 07/21/2014  Final Diagnoses:  1. Acute congestive heart failure, multifactorial: severe right-sided heart failure, diastolic heart failure, moderate-to-severe pulmonary hypertension 2. Acute on chronic hypoxic respiratory failure with acute on chronic hypoxemic respiratory failure 3. Demand ischemia 4. Possible acute renal failure superimposed on CKD stage III 5. Anemia of chronic disease 6. DM 7. Morbid obesity   History of present illness:  79 year old woman with history of severe right-sided heart failure, moderate to severe pulmonary hypertension, diastolic heart failure and chronic hypoxic respiratory failure who presented with history of increasing lower extremity edema, orthopnea, hypoxia and difficulty breathing. In the emergency room found to be hypoxic on usual oxygen, placed on BiPAP and admitted for treatment for heart failure.  Hospital Course:  Ms. Round was admitted to stepdown on BiPAP and treated with IV diuretics for significant acute CHF and significant volume overload. CTA was negative for infection or pulmonary embolism. Mild troponin elevation was seen and was felt to represent demand ischemia. IV diuresis resulted in symptomatic improvement and significant weight loss. She was able to tolerate periods off BiPAP, but required high-flow oxygen by face mask (above chronic 5L requirement) to maintain oxygen saturations, though without apparent distress.   Chronic hypoxia/hypoxemia subsequently worsened the evening of 11/16 and she was unable to maintain sats on BiPAP, though mentation remained intact. Given failure of BiPAP, she was intubated but oxygenation worsened afterwards and she subsequently suffered cardiac and respiratory arrest. CPR was terminated per family wishes and she died early in the morning 22-Jul-2023.  Time: 25  minutes  Signed:  Murray Hodgkins, MD  [facility] 07/22/2014, 5:53 PM

## 2014-07-26 IMAGING — CR DG CHEST 2V
2 series · 2 of 2 positions shown · non-contrast
Comparison: 01/11/2013

CLINICAL DATA: Shortness of breath

CHEST - 2 VIEW

[x chest ap]
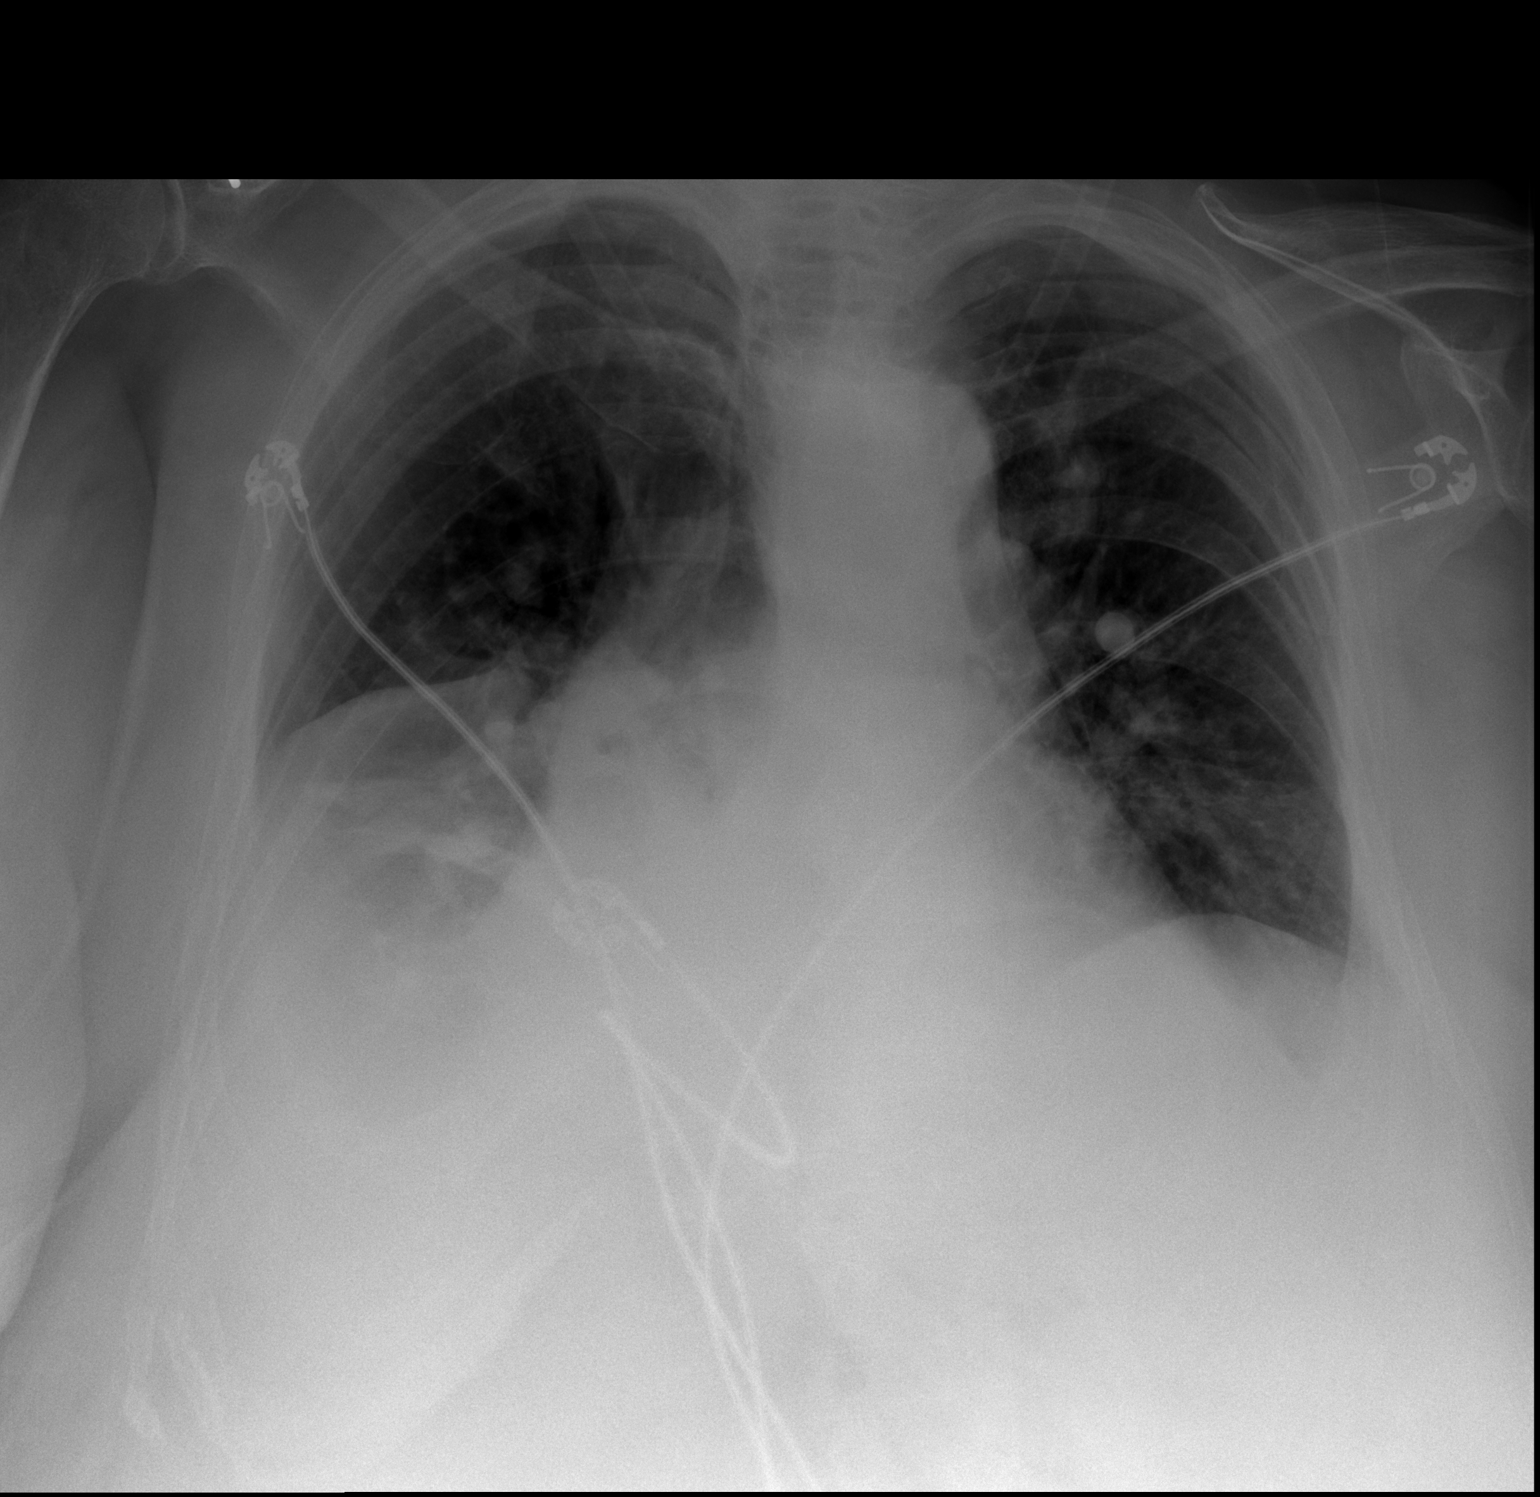

[w chest lat]
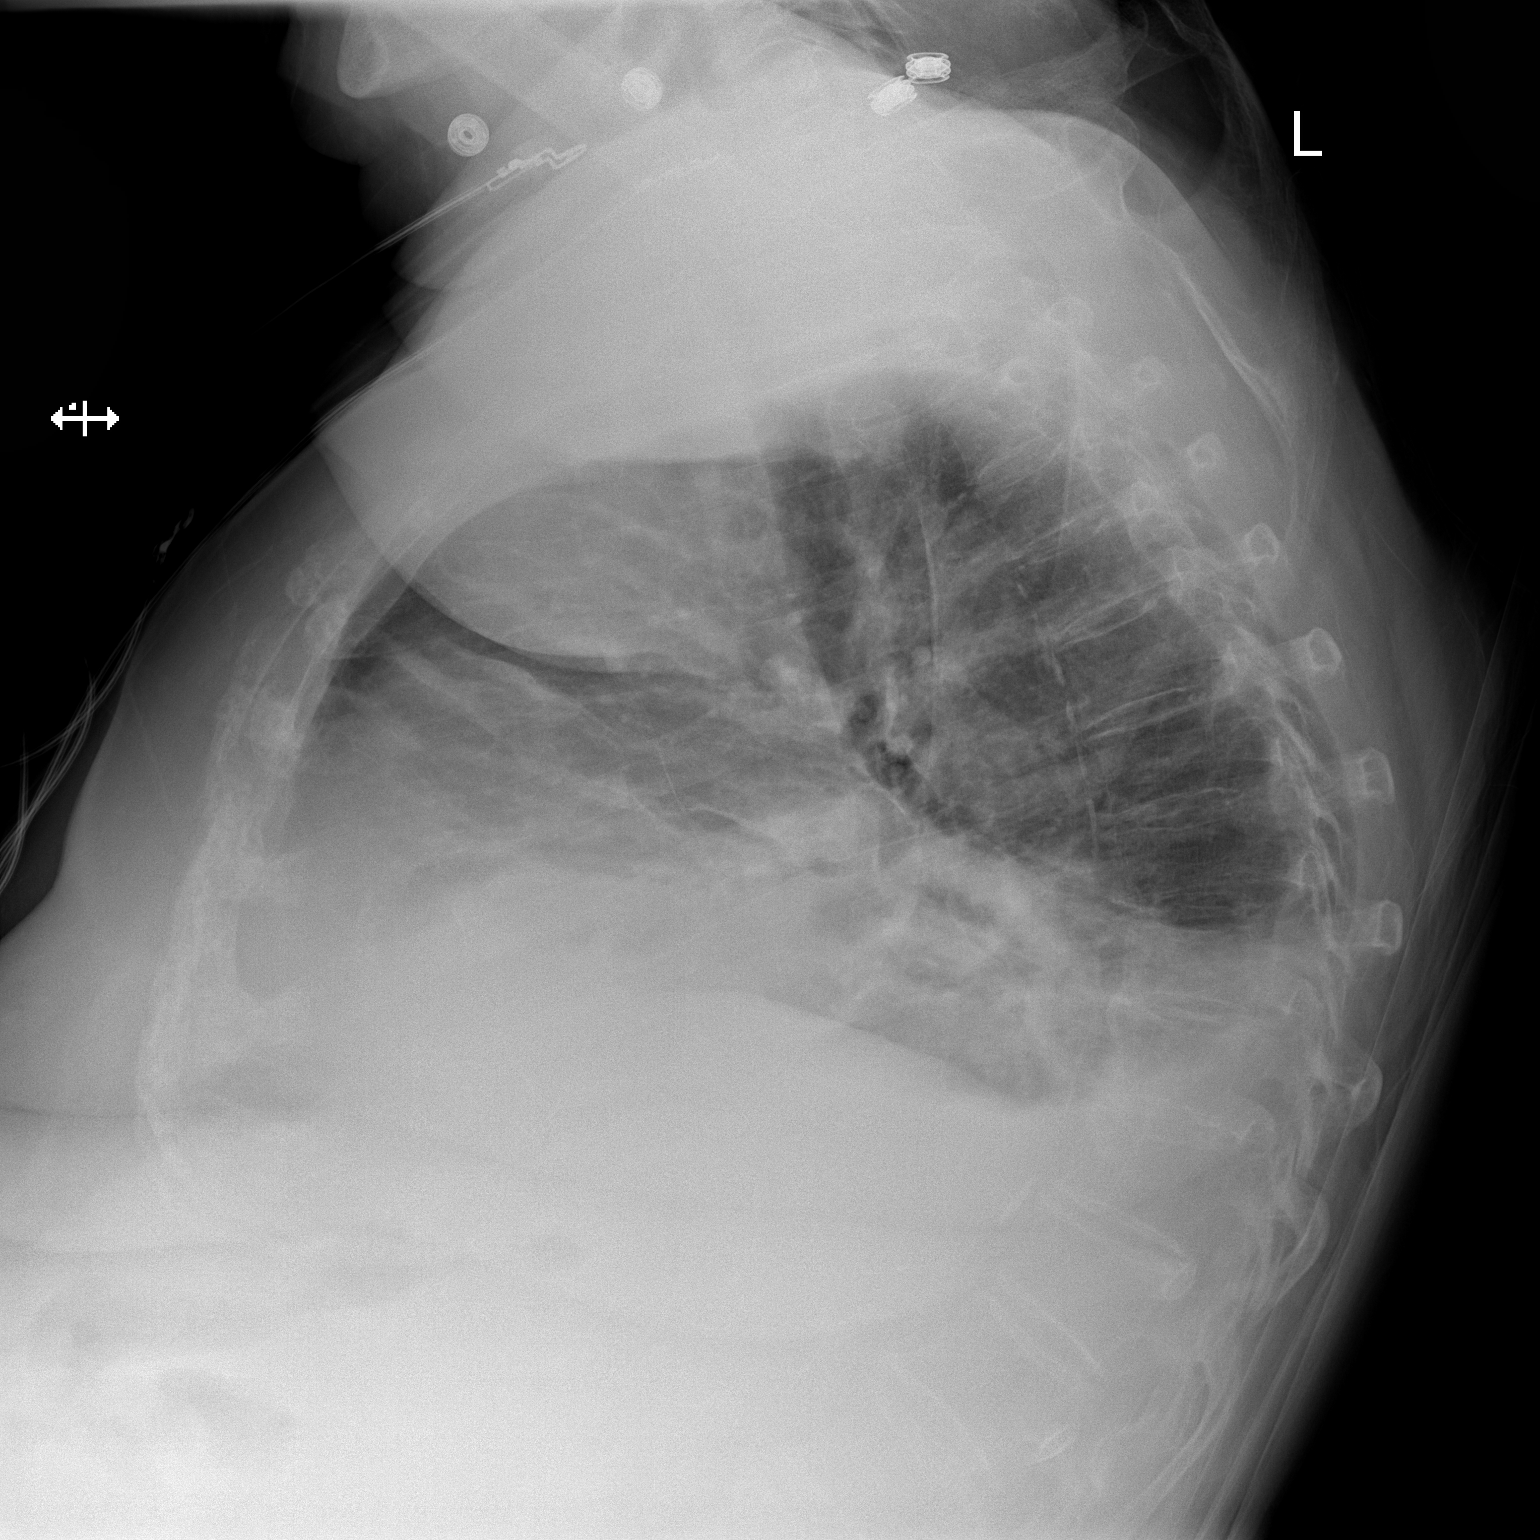

[2 of 2 positions shown; findings below may reference images not displayed]

FINDINGS: Bilateral pleural effusions, right greater than left.
New right infrahilar consolidation / atelectasis.  Low lung
volumes.  Mild cardiomegaly.  Mild central pulmonary vascular
prominence.  Atheromatous aorta.
IMPRESSION: 1.  Cardiomegaly with bilateral pleural effusions and   right lower
lobe atelectasis/consolidation.

## 2014-07-26 NOTE — Progress Notes (Signed)
[facility] Progress Note Patient Name: [name] DOB: 03/10/1935 MRN: [id number]   Date of Service  Jul 20, 2014  HPI/Events of Note  49 F admitted with hypoxic resp failure felt secondary to CHF requiring BiPAP.  Consistently low sats that correlate with ABG.  Intubated and now with failure to oxygenate.  CTA negative for PE  eICU Interventions  Plan: Sedate/Paralyze ARDS protocol Pressors for BP support     Intervention Category Major Interventions: Respiratory failure - evaluation and management  Windi Toro 07/20/14, 1:13 AM

## 2014-07-26 NOTE — Progress Notes (Signed)
WASTE:  Fentanyl gtt discontinued at 0130 on 08/03/14, Wasted in sink 225 cc.  2nd RN witnessed Gerrie Nordmann RN

## 2014-07-26 NOTE — Code Documentation (Signed)
Throughout the day patient continued to become more hypoxic to the point of 71% oxygen saturation on the Bipap 14/9 and 100 FIO2.  Dr. Shanon Brow at bedside to discuss need for mechanical ventilation after patient stated "she can't do this no more"  She was exhausting herself. Patient agreed to the ventilator.  Teams called in and intubation went smoothly at 0010 using a 7.5 ETT, 20 mg etomidate at 0007 and 100 mg succinylcholine at 0008 cxr verified placement, as well as auscultation of breath sounds however patient was not able to maintain oxygen levels greater than 67% was breathing over ventilator, propofol gtt begun at 0011 for sedation.  Additional measures taken bag/mask ventilation, adjusting settings, eventually blood pressure began to decline a neo-synephrine gtt begun at 0042. A central line placement was ordered and was not able to be attained.  HR dropped to a brady ventricular rhythm, HR 37, code called and chest compressions began.  Epinephrine administered x2 with Dr. Jimmy Footman at bs via [place] L[place], Attending left to consult with family.  Upon return family had decided to end all further treatments and let her pass.  Time of death was called at 73.

## 2014-07-26 NOTE — Progress Notes (Signed)
Pt has been hypoxic despite bipap most of the day but mentating normally.  Called around 1130pm by RN that her oxygen sats were slowly declining further.  Went to pt bedside.  She was not tachypneic, mentating normally and at first did not want to be intubated.  Heart RRR no m/r/g chest was cta bilaterally with good air movement.  After further discussion with her and her daughters her main concern was that if she were intubated it would be permanent.  I reassured her that that would not be the case, that her family members would honor her request to only be on life support temporarily.  We proceeded with intubation and sedation with help of dr Florina Ou from ED.  Her oxygen sats were consistently in the upper 70's low 80s on the bipap prior to intubation other vitals all stable.  After intubation and sedation, her oxygen saturation actually worsened with both the vent and with manual bagging.  o2 monitor was also changed to make sure accurate reading.  ETT placement was verified with xray, and pt had equal and good breath sounds bilaterally.   Her oxygen sats did not come above high 70s and worsened into the mid 50s.  diprovan gtt was initiated, her bp began to drop.  Her vent settings were adjusted with the help of elink dr deterding, with these adjustments her vitals continued to worsen.  She was started on neosynephrine gtt.  And fentanyl gtt.  bp did not improve with maxed dosing of the neosynephrine gtt pt proceeded to become bradycardic and then went into PEA arrest.  ACLS protocol was started.  I went to discuss with multiple family members who were present in waiting room, discussed her rapid deterioration over the last 1.5 hours and did not think she was going to survive this event.  They requested that we stop her code blue, and make her comfort care in order for them to be with her during her time of passing.  Code was stopped.  Dr Florina Ou verified no ventriclar activity per ultasound.  Pt was pronounced  dead at 130am  (there was never recovery of pulse or rhythm during ACLS protocol).  Family was escorted into the room.  Approximately 1.5 hours cc time spent.

## 2014-07-26 NOTE — ED Provider Notes (Addendum)
INTUBATION Performed by: Wynetta Fines  Called to ICU by hospitalist with request to intubate patient.  Required items: required blood products, implants, devices, and special equipment available Patient identity confirmed: provided demographic data and hospital-assigned identification number Time out: Immediately prior to procedure a "time out" was called to verify the correct patient, procedure, equipment, support staff and site/side marked as required.  Indications: respiratory failure  Intubation method: Glidescope Laryngoscopy   Preoxygenation: BVM  Sedatives: Etomidate Paralytic: Succinylcholine  Tube Size: 7.5 cuffed  Post-procedure assessment: chest rise and ETCO2 monitor Breath sounds: equal and absent over the epigastrium Tube secured with: ETT holder Chest x-ray interpreted by radiologist and me.  Chest x-ray findings: endotracheal tube in appropriate position after initial adjustment  Patient tolerated the procedure well with no immediate complications.   I was Subsequently called to the ICU for central line placement as the patient's status had significantly declined post intubation.  CENTRAL LINE Performed by: GBTDVV,OHYW L Consent: The procedure was performed in an emergent situation. Required items: required blood products, implants, devices, and special equipment available Patient identity confirmed: arm band and provided demographic data Time out: Immediately prior to procedure a "time out" was called to verify the correct patient, procedure, equipment, support staff and site/side marked as required. Indications: vascular access Local anesthetic: none Patient sedated: on ventilator with Propofol drip and fentanyl per admitting physician Preparation: skin prepped with 2% chlorhexidine Skin prep agent dried: skin prep agent completely dried prior to procedure Sterile barriers: all five maximum sterile barriers used except sterile gown - cap, mask, sterile  gloves, and large sterile sheet Hand hygiene: hand hygiene performed prior to central venous catheter insertion  Location details: left femoral vein  Catheter type: triple lumen Catheter size: 8 Fr Pre-procedure: landmarks identified Ultrasound guidance: yes Successful placement: no; got blood return on multiple attempts but never able to pass wire  Procedure terminated due to death of patient. CPR directed by Drs. Shanon Brow and [place].    Wynetta Fines, MD 2014-07-30 [id number]  Wynetta Fines, MD 30-Jul-2014 0626

## 2014-07-26 DEATH — deceased

## 2014-07-29 ENCOUNTER — Ambulatory Visit: Payer: PRIVATE HEALTH INSURANCE | Admitting: Internal Medicine

## 2015-01-24 IMAGING — DX DG CHEST 1V PORT
1 series · 1 of 1 positions shown · non-contrast
Comparison: DG CHEST 1V PORT dated 04/04/2013;

CLINICAL DATA: Short of breath for 1 week. Worsening shortness of
breath.

EXAM:
PORTABLE CHEST - 1 VIEW

[portable]
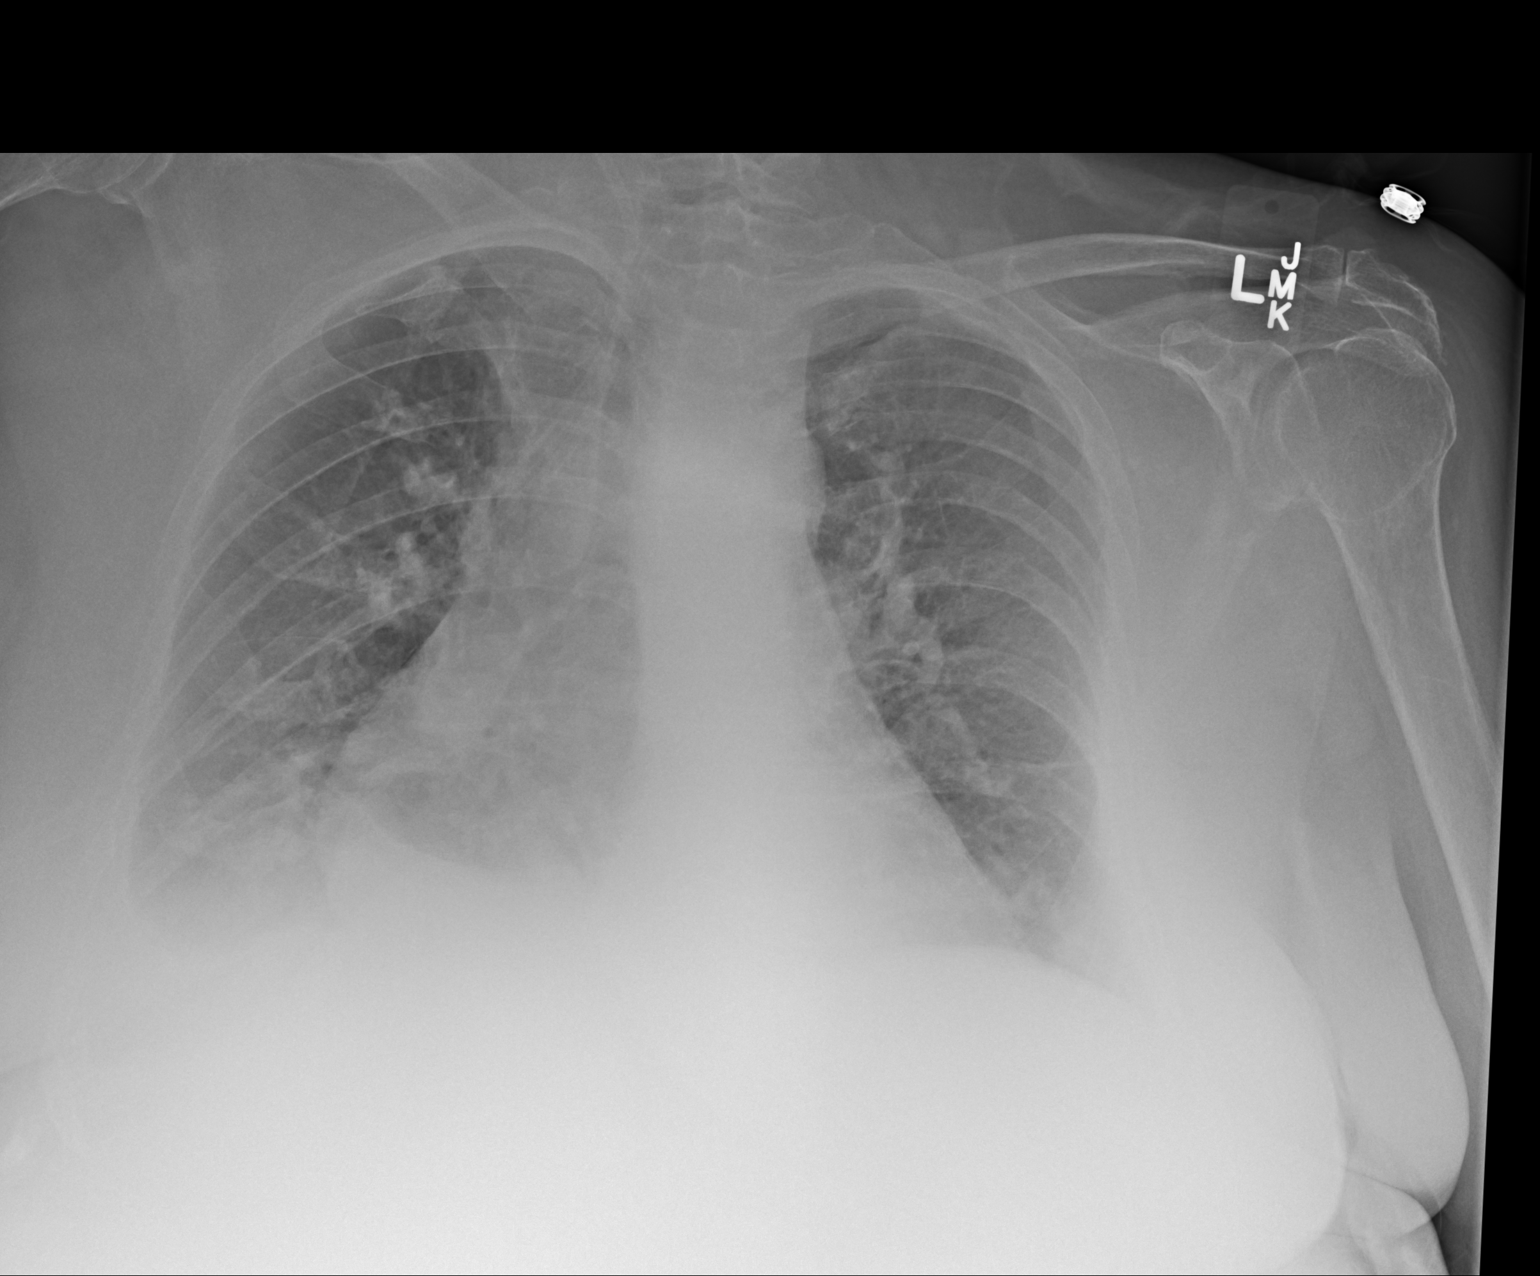

[1 of 1 positions shown; findings below may reference images not displayed]

DG CHEST 1V PORT
dated 03/15/2013; DG CHEST 1V PORT dated 03/13/2013; DG CHEST 2 VIEW
dated 03/08/2013
FINDINGS: Patient is rotated to the right. Oxygen tubing projects over the
chest. The cardiopericardial silhouette appears enlarged. Pulmonary
vascular congestion is present. There is a new right pleural
effusion and right basilar atelectasis and partial collapse.
Underlying airspace disease cannot be excluded. Mild left basilar
atelectasis is present. Lucency is present in the retrocardiac area
which is favored to be projectional although a hiatal hernia could
produce this appearance as well.
IMPRESSION: New right pleural effusion and basilar collapse/consolidation. The
appearance is similar to the prior exam 03/13/2013 and failure with
asymmetric effusion and edema is favored. Pneumonia or aspiration or
in the differential considerations.
